# Patient Record
Sex: Female | Born: 1960 | Race: Black or African American | Hispanic: No | Marital: Married | Smoking: Never smoker
Health system: Southern US, Community
[De-identification: ages and names within clinical notes are randomized; demographics above are authoritative.]

## PROBLEM LIST (undated history)

## (undated) DIAGNOSIS — Z923 Personal history of irradiation: Secondary | ICD-10-CM

## (undated) DIAGNOSIS — E7800 Pure hypercholesterolemia, unspecified: Secondary | ICD-10-CM

## (undated) DIAGNOSIS — G709 Myoneural disorder, unspecified: Secondary | ICD-10-CM

## (undated) DIAGNOSIS — J40 Bronchitis, not specified as acute or chronic: Secondary | ICD-10-CM

## (undated) DIAGNOSIS — I10 Essential (primary) hypertension: Secondary | ICD-10-CM

## (undated) DIAGNOSIS — Z9221 Personal history of antineoplastic chemotherapy: Secondary | ICD-10-CM

## (undated) DIAGNOSIS — T7840XA Allergy, unspecified, initial encounter: Secondary | ICD-10-CM

## (undated) DIAGNOSIS — J302 Other seasonal allergic rhinitis: Secondary | ICD-10-CM

## (undated) DIAGNOSIS — R51 Headache: Secondary | ICD-10-CM

## (undated) DIAGNOSIS — M47812 Spondylosis without myelopathy or radiculopathy, cervical region: Secondary | ICD-10-CM

## (undated) DIAGNOSIS — J189 Pneumonia, unspecified organism: Secondary | ICD-10-CM

## (undated) DIAGNOSIS — D72829 Elevated white blood cell count, unspecified: Secondary | ICD-10-CM

## (undated) DIAGNOSIS — J310 Chronic rhinitis: Secondary | ICD-10-CM

## (undated) DIAGNOSIS — Z1501 Genetic susceptibility to malignant neoplasm of breast: Secondary | ICD-10-CM

## (undated) DIAGNOSIS — J329 Chronic sinusitis, unspecified: Secondary | ICD-10-CM

## (undated) DIAGNOSIS — D649 Anemia, unspecified: Secondary | ICD-10-CM

## (undated) DIAGNOSIS — C50919 Malignant neoplasm of unspecified site of unspecified female breast: Secondary | ICD-10-CM

## (undated) DIAGNOSIS — G35 Multiple sclerosis: Principal | ICD-10-CM

## (undated) DIAGNOSIS — Z1589 Genetic susceptibility to other disease: Secondary | ICD-10-CM

## (undated) HISTORY — DX: Chronic rhinitis: J31.0

## (undated) HISTORY — DX: Spondylosis without myelopathy or radiculopathy, cervical region: M47.812

## (undated) HISTORY — PX: TUBAL LIGATION: SHX77

## (undated) HISTORY — DX: Genetic susceptibility to malignant neoplasm of ovary: Z15.01

## (undated) HISTORY — DX: Genetic susceptibility to other disease: Z15.89

## (undated) HISTORY — DX: Chronic sinusitis, unspecified: J32.9

## (undated) HISTORY — DX: Anemia, unspecified: D64.9

## (undated) HISTORY — DX: Elevated white blood cell count, unspecified: D72.829

## (undated) HISTORY — DX: Bronchitis, not specified as acute or chronic: J40

## (undated) HISTORY — PX: CORRECTION HAMMER TOE: SUR317

## (undated) HISTORY — PX: ABDOMINAL HYSTERECTOMY: SHX81

## (undated) HISTORY — DX: Allergy, unspecified, initial encounter: T78.40XA

## (undated) HISTORY — DX: Headache: R51

## (undated) HISTORY — DX: Pure hypercholesterolemia, unspecified: E78.00

## (undated) HISTORY — DX: Multiple sclerosis: G35

---
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
## 1998-04-24 ENCOUNTER — Other Ambulatory Visit: Admission: RE | Admit: 1998-04-24 | Discharge: 1998-04-24 | Payer: Self-pay | Admitting: Obstetrics

## 1998-05-15 ENCOUNTER — Ambulatory Visit (HOSPITAL_COMMUNITY): Admission: RE | Admit: 1998-05-15 | Discharge: 1998-05-15 | Payer: Self-pay | Admitting: Obstetrics

## 1998-05-15 ENCOUNTER — Encounter: Payer: Self-pay | Admitting: Obstetrics

## 1998-05-17 ENCOUNTER — Encounter: Payer: Self-pay | Admitting: Obstetrics

## 1998-05-17 ENCOUNTER — Ambulatory Visit (HOSPITAL_COMMUNITY): Admission: RE | Admit: 1998-05-17 | Discharge: 1998-05-17 | Payer: Self-pay | Admitting: Obstetrics

## 1998-11-14 ENCOUNTER — Encounter: Payer: Self-pay | Admitting: Obstetrics

## 1998-11-14 ENCOUNTER — Ambulatory Visit (HOSPITAL_COMMUNITY): Admission: RE | Admit: 1998-11-14 | Discharge: 1998-11-14 | Payer: Self-pay | Admitting: Obstetrics

## 1999-05-29 ENCOUNTER — Encounter: Payer: Self-pay | Admitting: *Deleted

## 1999-05-29 ENCOUNTER — Encounter: Admission: RE | Admit: 1999-05-29 | Discharge: 1999-05-29 | Payer: Self-pay | Admitting: *Deleted

## 1999-10-09 ENCOUNTER — Other Ambulatory Visit: Admission: RE | Admit: 1999-10-09 | Discharge: 1999-10-09 | Payer: Self-pay | Admitting: *Deleted

## 1999-10-22 ENCOUNTER — Inpatient Hospital Stay (HOSPITAL_COMMUNITY): Admission: AD | Admit: 1999-10-22 | Discharge: 1999-10-22 | Payer: Self-pay | Admitting: Obstetrics

## 1999-10-22 ENCOUNTER — Encounter: Payer: Self-pay | Admitting: Obstetrics

## 1999-10-23 ENCOUNTER — Other Ambulatory Visit: Admission: RE | Admit: 1999-10-23 | Discharge: 1999-10-23 | Payer: Self-pay | Admitting: Obstetrics

## 2000-01-14 ENCOUNTER — Ambulatory Visit (HOSPITAL_COMMUNITY): Admission: RE | Admit: 2000-01-14 | Discharge: 2000-01-14 | Payer: Self-pay | Admitting: Obstetrics

## 2000-01-14 ENCOUNTER — Encounter: Payer: Self-pay | Admitting: Obstetrics

## 2000-02-28 ENCOUNTER — Inpatient Hospital Stay (HOSPITAL_COMMUNITY): Admission: AD | Admit: 2000-02-28 | Discharge: 2000-02-28 | Payer: Self-pay | Admitting: Obstetrics

## 2000-02-29 ENCOUNTER — Encounter: Payer: Self-pay | Admitting: Obstetrics

## 2000-02-29 ENCOUNTER — Inpatient Hospital Stay (HOSPITAL_COMMUNITY): Admission: AD | Admit: 2000-02-29 | Discharge: 2000-03-03 | Payer: Self-pay | Admitting: Obstetrics

## 2000-03-08 ENCOUNTER — Inpatient Hospital Stay (HOSPITAL_COMMUNITY): Admission: AD | Admit: 2000-03-08 | Discharge: 2000-03-08 | Payer: Self-pay | Admitting: *Deleted

## 2000-03-09 ENCOUNTER — Encounter: Payer: Self-pay | Admitting: *Deleted

## 2000-03-10 ENCOUNTER — Inpatient Hospital Stay (HOSPITAL_COMMUNITY): Admission: AD | Admit: 2000-03-10 | Discharge: 2000-03-14 | Payer: Self-pay | Admitting: Obstetrics

## 2000-04-15 ENCOUNTER — Ambulatory Visit (HOSPITAL_COMMUNITY): Admission: RE | Admit: 2000-04-15 | Discharge: 2000-04-15 | Payer: Self-pay | Admitting: Obstetrics

## 2000-04-15 ENCOUNTER — Encounter: Payer: Self-pay | Admitting: Obstetrics

## 2000-05-12 ENCOUNTER — Encounter (HOSPITAL_COMMUNITY): Admission: RE | Admit: 2000-05-12 | Discharge: 2000-05-20 | Payer: Self-pay | Admitting: Obstetrics

## 2000-05-15 ENCOUNTER — Inpatient Hospital Stay (HOSPITAL_COMMUNITY): Admission: AD | Admit: 2000-05-15 | Discharge: 2000-05-15 | Payer: Self-pay | Admitting: Obstetrics

## 2000-05-19 ENCOUNTER — Encounter (INDEPENDENT_AMBULATORY_CARE_PROVIDER_SITE_OTHER): Payer: Self-pay | Admitting: Specialist

## 2000-05-19 ENCOUNTER — Encounter: Payer: Self-pay | Admitting: Obstetrics

## 2000-05-19 ENCOUNTER — Inpatient Hospital Stay (HOSPITAL_COMMUNITY): Admission: AD | Admit: 2000-05-19 | Discharge: 2000-05-22 | Payer: Self-pay | Admitting: Obstetrics

## 2000-05-23 ENCOUNTER — Encounter: Admission: RE | Admit: 2000-05-23 | Discharge: 2000-06-22 | Payer: Self-pay | Admitting: Obstetrics

## 2000-07-23 ENCOUNTER — Encounter: Admission: RE | Admit: 2000-07-23 | Discharge: 2000-08-22 | Payer: Self-pay | Admitting: Obstetrics

## 2000-07-24 ENCOUNTER — Encounter: Payer: Self-pay | Admitting: Obstetrics

## 2000-07-24 ENCOUNTER — Ambulatory Visit (HOSPITAL_COMMUNITY): Admission: RE | Admit: 2000-07-24 | Discharge: 2000-07-24 | Payer: Self-pay | Admitting: Obstetrics

## 2000-09-22 ENCOUNTER — Encounter: Admission: RE | Admit: 2000-09-22 | Discharge: 2000-10-22 | Payer: Self-pay | Admitting: Obstetrics

## 2000-10-23 ENCOUNTER — Encounter: Admission: RE | Admit: 2000-10-23 | Discharge: 2000-11-22 | Payer: Self-pay | Admitting: Obstetrics

## 2000-12-22 ENCOUNTER — Other Ambulatory Visit: Admission: RE | Admit: 2000-12-22 | Discharge: 2000-12-22 | Payer: Self-pay | Admitting: *Deleted

## 2000-12-23 ENCOUNTER — Encounter: Admission: RE | Admit: 2000-12-23 | Discharge: 2001-01-22 | Payer: Self-pay | Admitting: Obstetrics

## 2001-02-22 ENCOUNTER — Encounter: Admission: RE | Admit: 2001-02-22 | Discharge: 2001-03-24 | Payer: Self-pay | Admitting: Obstetrics

## 2001-03-25 ENCOUNTER — Encounter: Admission: RE | Admit: 2001-03-25 | Discharge: 2001-04-24 | Payer: Self-pay | Admitting: Obstetrics

## 2001-12-18 ENCOUNTER — Inpatient Hospital Stay (HOSPITAL_COMMUNITY): Admission: AD | Admit: 2001-12-18 | Discharge: 2001-12-18 | Payer: Self-pay | Admitting: Obstetrics

## 2002-03-01 ENCOUNTER — Ambulatory Visit (HOSPITAL_COMMUNITY): Admission: RE | Admit: 2002-03-01 | Discharge: 2002-03-01 | Payer: Self-pay | Admitting: Obstetrics

## 2002-03-01 ENCOUNTER — Encounter: Payer: Self-pay | Admitting: Obstetrics

## 2002-07-09 ENCOUNTER — Encounter: Admission: RE | Admit: 2002-07-09 | Discharge: 2002-07-09 | Payer: Self-pay | Admitting: Obstetrics

## 2002-07-13 ENCOUNTER — Encounter: Admission: RE | Admit: 2002-07-13 | Discharge: 2002-07-13 | Payer: Self-pay | Admitting: Obstetrics

## 2002-07-16 ENCOUNTER — Encounter: Admission: RE | Admit: 2002-07-16 | Discharge: 2002-07-16 | Payer: Self-pay | Admitting: Obstetrics

## 2002-07-20 ENCOUNTER — Encounter: Admission: RE | Admit: 2002-07-20 | Discharge: 2002-07-20 | Payer: Self-pay | Admitting: Obstetrics

## 2002-07-23 ENCOUNTER — Encounter: Admission: RE | Admit: 2002-07-23 | Discharge: 2002-07-23 | Payer: Self-pay | Admitting: Obstetrics

## 2002-07-26 ENCOUNTER — Encounter: Admission: RE | Admit: 2002-07-26 | Discharge: 2002-07-26 | Payer: Self-pay | Admitting: Obstetrics

## 2002-07-30 ENCOUNTER — Encounter: Admission: RE | Admit: 2002-07-30 | Discharge: 2002-07-30 | Payer: Self-pay | Admitting: Obstetrics

## 2002-08-02 ENCOUNTER — Encounter: Admission: RE | Admit: 2002-08-02 | Discharge: 2002-08-02 | Payer: Self-pay | Admitting: Obstetrics

## 2002-08-04 ENCOUNTER — Inpatient Hospital Stay (HOSPITAL_COMMUNITY): Admission: AD | Admit: 2002-08-04 | Discharge: 2002-08-08 | Payer: Self-pay | Admitting: Obstetrics

## 2002-08-04 ENCOUNTER — Encounter (INDEPENDENT_AMBULATORY_CARE_PROVIDER_SITE_OTHER): Payer: Self-pay | Admitting: *Deleted

## 2003-01-26 ENCOUNTER — Ambulatory Visit (HOSPITAL_COMMUNITY): Admission: RE | Admit: 2003-01-26 | Discharge: 2003-01-26 | Payer: Self-pay | Admitting: Obstetrics

## 2003-02-03 ENCOUNTER — Encounter: Admission: RE | Admit: 2003-02-03 | Discharge: 2003-02-03 | Payer: Self-pay | Admitting: Obstetrics

## 2003-08-19 ENCOUNTER — Encounter: Admission: RE | Admit: 2003-08-19 | Discharge: 2003-08-19 | Payer: Self-pay | Admitting: Unknown Physician Specialty

## 2004-01-19 ENCOUNTER — Encounter (INDEPENDENT_AMBULATORY_CARE_PROVIDER_SITE_OTHER): Payer: Self-pay | Admitting: Specialist

## 2004-01-19 ENCOUNTER — Ambulatory Visit (HOSPITAL_COMMUNITY): Admission: RE | Admit: 2004-01-19 | Discharge: 2004-01-19 | Payer: Self-pay | Admitting: Obstetrics

## 2004-02-12 HISTORY — PX: PARTIAL HYSTERECTOMY: SHX80

## 2004-02-12 HISTORY — PX: ABDOMINAL HYSTERECTOMY: SHX81

## 2004-02-16 ENCOUNTER — Encounter: Admission: RE | Admit: 2004-02-16 | Discharge: 2004-02-16 | Payer: Self-pay | Admitting: Obstetrics

## 2004-12-27 ENCOUNTER — Encounter (INDEPENDENT_AMBULATORY_CARE_PROVIDER_SITE_OTHER): Payer: Self-pay | Admitting: *Deleted

## 2004-12-27 ENCOUNTER — Inpatient Hospital Stay (HOSPITAL_COMMUNITY): Admission: RE | Admit: 2004-12-27 | Discharge: 2004-12-30 | Payer: Self-pay | Admitting: Obstetrics

## 2005-03-05 ENCOUNTER — Encounter: Admission: RE | Admit: 2005-03-05 | Discharge: 2005-03-05 | Payer: Self-pay | Admitting: Unknown Physician Specialty

## 2005-03-13 ENCOUNTER — Encounter: Admission: RE | Admit: 2005-03-13 | Discharge: 2005-03-13 | Payer: Self-pay | Admitting: Obstetrics

## 2006-03-17 ENCOUNTER — Encounter: Admission: RE | Admit: 2006-03-17 | Discharge: 2006-03-17 | Payer: Self-pay | Admitting: Obstetrics

## 2007-02-12 DIAGNOSIS — G35 Multiple sclerosis: Secondary | ICD-10-CM

## 2007-02-12 DIAGNOSIS — D72829 Elevated white blood cell count, unspecified: Secondary | ICD-10-CM

## 2007-02-12 HISTORY — DX: Elevated white blood cell count, unspecified: D72.829

## 2007-02-12 HISTORY — DX: Multiple sclerosis: G35

## 2007-03-19 ENCOUNTER — Encounter: Admission: RE | Admit: 2007-03-19 | Discharge: 2007-03-19 | Payer: Self-pay | Admitting: Obstetrics

## 2007-05-05 ENCOUNTER — Encounter: Admission: RE | Admit: 2007-05-05 | Discharge: 2007-05-05 | Payer: Self-pay | Admitting: Family Medicine

## 2007-05-10 ENCOUNTER — Encounter: Admission: RE | Admit: 2007-05-10 | Discharge: 2007-05-10 | Payer: Self-pay | Admitting: Neurology

## 2007-05-11 ENCOUNTER — Encounter: Admission: RE | Admit: 2007-05-11 | Discharge: 2007-05-11 | Payer: Self-pay | Admitting: Neurology

## 2007-06-09 ENCOUNTER — Encounter: Admission: RE | Admit: 2007-06-09 | Discharge: 2007-07-01 | Payer: Self-pay | Admitting: Neurology

## 2007-07-02 ENCOUNTER — Encounter: Admission: RE | Admit: 2007-07-02 | Discharge: 2007-07-08 | Payer: Self-pay | Admitting: Neurology

## 2008-03-21 ENCOUNTER — Encounter: Admission: RE | Admit: 2008-03-21 | Discharge: 2008-03-21 | Payer: Self-pay | Admitting: Obstetrics

## 2009-03-24 ENCOUNTER — Encounter: Admission: RE | Admit: 2009-03-24 | Discharge: 2009-03-24 | Payer: Self-pay | Admitting: Obstetrics

## 2010-03-03 ENCOUNTER — Encounter: Payer: Self-pay | Admitting: Obstetrics

## 2010-03-07 ENCOUNTER — Other Ambulatory Visit: Payer: Self-pay | Admitting: Obstetrics

## 2010-03-07 DIAGNOSIS — Z1239 Encounter for other screening for malignant neoplasm of breast: Secondary | ICD-10-CM

## 2010-03-26 ENCOUNTER — Ambulatory Visit
Admission: RE | Admit: 2010-03-26 | Discharge: 2010-03-26 | Disposition: A | Discharge status: Home / Self Care | Payer: BC Managed Care – PPO | Source: Ambulatory Visit | Attending: Obstetrics | Admitting: Obstetrics

## 2010-03-26 DIAGNOSIS — Z1239 Encounter for other screening for malignant neoplasm of breast: Secondary | ICD-10-CM

## 2010-04-02 ENCOUNTER — Other Ambulatory Visit: Payer: Self-pay | Admitting: Obstetrics

## 2010-04-02 DIAGNOSIS — R928 Other abnormal and inconclusive findings on diagnostic imaging of breast: Secondary | ICD-10-CM

## 2010-04-05 ENCOUNTER — Ambulatory Visit: Payer: BC Managed Care – PPO

## 2010-04-05 ENCOUNTER — Ambulatory Visit
Admission: RE | Admit: 2010-04-05 | Discharge: 2010-04-05 | Disposition: A | Discharge status: Home / Self Care | Payer: BC Managed Care – PPO | Source: Ambulatory Visit | Attending: Obstetrics | Admitting: Obstetrics

## 2010-04-05 DIAGNOSIS — R928 Other abnormal and inconclusive findings on diagnostic imaging of breast: Secondary | ICD-10-CM

## 2010-06-29 NOTE — Op Note (Signed)
[facility]  Patient:    Jacqueline Adams, Jacqueline Adams                      MRN: [id number] Proc. Date: 05/19/00 Adm. Date:  [id number] Disc. Date: [id number] Attending:  Venita Sheffield                           Operative Report  REDICTATION  PREOPERATIVE DIAGNOSIS:       Nonreassuring fetal heart rate tracing at                               37 weeks in a patient with ______, and a                               previous intrauterine spontaneous delivery at                               term.  POSTOPERATIVE DIAGNOSIS:      Nonreassuring fetal haert rate tracing at                               37 weeks in a patient with _______, and a                               previous intrauterine spontaneous delivery at                               term.  OPERATION:  SURGEON:                      Kathreen Cosier, M.D.  ANESTHESIA:                   Spinal.  DESCRIPTION OF PROCEDURE:     The patient was placed on the operating table in the supine position after the spinal administered.  The abdomen prepped and draped, and the bladder emptied with a Foley catheter.  A transverse suprapubic incision made through the old scar, and carried down to the rectus fascia.  The fascia cleaned and incised the length of the incision.  The recti muscles retracted laterally.  The peritoneum incised longitudinally.  The visceroperitoneum above the bladder was incised with the Metzenbaum scissors, and the bladder pushed off of the uterus.  A transverse lower uterine incision made.  Fluid was clear.  Apgar was 6 and 9.  The baby weighed 6 pounds and 10 ounces and delivered from the OP position.  The placenta was posterior and sent to pathology.  It was noted there was 6 cm anterior fundal myoma.  The uterine cavity cleaned with dry laps.  The uterine incision closed with continuous suture of #1 chromic including myometrium and endometrium.  The uterus closed in one layer.   The bladder flap reattached with 2-0 chromic. The uterus well contracted.  Tubes and ovaries normal.  Abdomen closed in layers.  The peritoneum with continuous suture of 0 chromic, the fascia with continuous suture of 0 Dexon, and the skin closed with subcuticular suture of 3-0 plain.  Blood  loss 500 cc.  The patient tolerated the procedure well and taken to the recovery room in good condition. DD:  06/05/00 TD:  06/06/00 Job: [id number]

## 2010-06-29 NOTE — Discharge Summary (Signed)
[facility]  Patient:    Jacqueline Adams, Jacqueline Adams                      MRN: [id number] Adm. Date:  [id number] Attending:  Venita Sheffield                           Discharge Summary  HISTORY OF PRESENT ILLNESS:   A 50 year old, gravida 2, para 1-0-0-0, at [redacted] weeks pregnant with premature contractions.  She had been on Terbutaline at home 5 mg every six hours, but she still had started having contractions.  HOSPITAL COURSE:              On admission her temperature was 101.6 and she was started on ampicillin 2 grams IV every six hours.  This has been the second hospital visit for the patient since she has been pregnant.  On admission to this visit, there was no nausea and no vomiting.  Her examination was basically benign.  She was seen in consult by Dr. Gavin Potters and the therapy unchanged.  She was subsequently changed to magnesium sulfate 4 gram loading and 2 grams an hour because of continuous contractions.  Eventually her contractions stopped on March 13, 2000.  She was started on Procardia 10 mg every six hours since January 30.  From January 31, to February 1, she had occasional irritability, but no contractions.  She rapidly defervesced while she was hospitalized and her white count which was 24.3 on admission was down to 17.2.  She was discharged home on March 14, 2000, on Procardia 10 mg every six hours, Tylenol No.3 one q.4h. p.r.n., and ampicillin 500 mg every six hours.  DISCHARGE DIAGNOSIS:          Status post premature contractions at 27 weeks.  FOLLOW-UP:                    The patient is to follow up with me in one week. DD:  05/13/00 TD:  05/13/00 Job: [id number]

## 2010-06-29 NOTE — Discharge Summary (Signed)
[facility]  Patient:    Jacqueline Adams, Jacqueline Adams                      MRN: [id number] Adm. Date:  [id number] Disc. Date: [id number] Attending:  Venita Sheffield                           Discharge Summary  HISTORY OF PRESENT ILLNESS:   The patient is a 50 year old, gravida 2, para 1-0-0-0, who had intrauterine fetal demise while in labor in 1989 at term. With this pregnancy her EDC was between May 07, 2000, and May 13, 2000. She was followed with nonstress tests from 36 weeks.  When she was seen on April 8, the nonstress test was nonreactive.  A biophysical profile was 6 out of 8.  Estimated fetal weight by ultrasound was 8 pounds.  She had had a previous cesarean section in 1989 when she had her stillborn.  On admission with this pregnancy her cervix was long, closed, and vertex at -3 station.  It was decided that she would be delivered by cesarean section at 37+ weeks because of history of intrauterine fetal demise while at term in labor, nonreactive nonstress test, unfavorable cervix.  Her Group B Strep was negative.  HOSPITAL COURSE:              She underwent a repeat low transverse cesarean section and had a female, Apgars 6 and 9 weighing 6 pounds 10 ounces from the OP position.  Placenta was sent to pathology.  She also had an anterior 6 cm myoma noted.  Postoperatively she did well.  Her hemoglobin was 9.7 and she was discharged home on the third postoperative day.  DISCHARGE MEDICATIONS:        1. Tylenol No.3.                               2. Micronor.                               3. Ferrous sulfate.  FOLLOW-UP:                    To see me in six weeks.  DISCHARGE DIAGNOSIS:          Status post repeat low transverse cesarean section at 37 weeks because of a nonreactive nonstress test and a history of of intrauterine fetal demise at term and also unfavorable cervix. DD:  05/22/00 TD:  05/22/00 Job: [id number]

## 2010-06-29 NOTE — Discharge Summary (Signed)
[name], Jacqueline Adams NO.:  [id number]   MEDICAL RECORD NO.:  [id number]          PATIENT TYPE:  INP   LOCATION:  9319                          FACILITY:  [facility]   PHYSICIAN:  Kathreen Cosier, M.D.DATE OF BIRTH:  1960-10-28   DATE OF ADMISSION:  12/27/2004  DATE OF DISCHARGE:  12/30/2004                                 DISCHARGE SUMMARY   The patient is a 50 year old gravida 3, para 2-0-1-2, with a history of  chronic pelvic pain, dysfunctional uterine bleeding, and NovaSure ablation a  year ago with no improvement.  She had three previous cesarean sections.  She was brought in for TAH and BSO.   LABORATORY DATA:  On admission, her hemoglobin was 11.9, white count 13.4,  platelets 336.  PT and PTT were normal.  Sodium was 140, potassium  3.8,  chloride 107, glucose 85.  Urinalysis was negative.  She was O positive.  Postoperatively, her hemoglobin was 10.4.   HOSPITAL COURSE:  The patient underwent a TAH and bilateral salpingectomy on  December 27, 2004, and she did well postoperatively.  She was discharged  home on the third postoperative day ambulatory and on a regular diet, on  November 19, to see me in 6 weeks.   DISCHARGE MEDICATIONS:  Tylox.   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           ______________________________  Kathreen Cosier, M.D.     BAM/MEDQ  D:  12/30/2004  T:  12/31/2004  Job:  [id number]

## 2010-06-29 NOTE — Discharge Summary (Signed)
   NAME:  Jacqueline Adams, Jacqueline Adams                      ACCOUNT NO.:  [id number]   MEDICAL RECORD NO.:  [id number]                   PATIENT TYPE:  INP   LOCATION:  9115                                 [facility]ACILITY[facility]  [facility]   PHYSICIAN:  Kathreen Cosier, M.D.           DATE OF BIRTH:  1960-05-30   DATE OF ADMISSION:  08/04/2002  DATE OF DISCHARGE:  08/08/2002                                 DISCHARGE SUMMARY   HISTORY OF PRESENT ILLNESS:  The patient is a 50 year old female gravida 3,  para 2-0-0-1, who had two previous C-sections and was admitted at term in  early labor for repeat C-section and tubal ligation.   HOSPITAL COURSE:  Using spinal anesthesia, the patient had a repeat C-  section.  She had a female with Apgars 9 and 9, weighing 8 pounds 9 ounces.  She also had tubal ligation performed.  Postoperatively, she did well.  Her  hemoglobin was 10.6 postoperatively and she was discharged home on the third  postoperative day, ambulatory, on a regular diet, to see me in six weeks.   DISCHARGE DIAGNOSIS:  Status post repeat low transverse cesarean section and  tubal ligation at term.                                               Kathreen Cosier, M.D.    BAM/MEDQ  D:  08/25/2002  T:  08/26/2002  Job:  [id number]

## 2010-06-29 NOTE — Discharge Summary (Signed)
[facility]  Patient:    SHARAINE, DELANGE                      MRN: [id number] Adm. Date:  [id number] Disc. Date: 03/03/00 Attending:  Venita Sheffield                           Discharge Summary  HISTORY OF PRESENT ILLNESS:   The patient is a 50 year old, gravida 2, para 1-0-0-0.  She had a stillborn previously.  Her EDC was June 10, 2000, and she was 25+ weeks.  She was admitted with premature contractions.  HOSPITAL COURSE:              On admission she was given Terbutaline subcu and p.o., however, she continued contracting.  She was started on magnesium sulfate 4 gram loading and 2 grams an hour.  She also received betamethasone x 2.  The patient did well.  Sometimes her magnesium had to be increased to 3 grams per hour and then back to 2 grams an hour.  She did well and was discharged home on March 03, 2000.  DISCHARGE MEDICATIONS:        1. Ampicillin 500 mg p.o. q.6h.                               2. Terbutaline 5 mg p.o. q.6h.  FOLLOW-UP:                    To see me in two weeks.  DISCHARGE DIAGNOSIS:          Status post intrauterine pregnancy at 25 weeks and premature contractions. DD:  05/13/00 TD:  05/13/00 Job: [id number]

## 2010-06-29 NOTE — Op Note (Addendum)
Jacqueline Adams, SILVERSTEIN NO.:  [id number]   MEDICAL RECORD NO.:  [id number]          PATIENT TYPE:  INP   LOCATION:  9399                          FACILITY:  [facility]   PHYSICIAN:  Kathreen Cosier, M.D.DATE OF BIRTH:  1960/03/03   DATE OF PROCEDURE:  12/27/2004  DATE OF DISCHARGE:                                 OPERATIVE REPORT   PREOPERATIVE DIAGNOSES:  1.  Chronic pelvic pain.  2.  Dysfunctional uterine bleeding.  3.  NovaSure ablation.   SURGEON:  Dr. Gaynell Face   FIRST ASSISTANT:  Dr. Alwyn Ren   PROCEDURE:  TAH and bilateral salpingectomy.   Under general anesthesia, the patient in supine position, the abdomen  prepped and draped.  Bladder emptied with a____ Foley catheter.  A                                       transverse suprapubic incision made through the old scar.  Incision carried  down to the fascia.  Fascia cleanly and incised the length of the incision.  Recti muscles retracted laterally, peritoneum incised longitudinally.  The  uterus was enlarged with myomas, and the uterus was attached to the anterior  peritoneum.  Using the Metzenbaum scissors, the adhesions were lysed.  The  ovaries were normal.  The remnants of the tubes present.  The right round  ligament was grasped with Kelly clamp, cut, and suture ligated with #1  chromic.  Procedure done in a similar fashion on the other side.  Using the  Metzenbaum scissors, the bladder was dissected off of the cervix.  The right  utero-ovarian ligament was grasped with a Kelly clamp but suture ligated  with #1 chromic.  Procedure done a similar fashion on the other side.  Uterine vessels skeletonized bilaterally, doubly clamped with Heaney clamps  on the right, cut and suture ligated with #1 chromic.  Procedure done in a  similar fashion on the other side.  Straight Kocher clamps used to grasp the  cardinal and uterosacral ligaments on the right.  These were cut, suture  ligated with #1 chromic.   Procedure done in a similar fashion on the other  side.  Using the Mayo scissors, the specimen consisting of the uterus and  cervix was removed at the cervicovaginal junction with the Mayo scissors.  Modified Richardson suture was placed in the angles of the vagina and the  vaginal vault run with interlocking suture of #1 chromic.  The vault was  left open; hemostasis was satisfactory; blood loss 350 mL.  Right Kelly  clamp used to grasp remaining portion of left tube.  This was cut and suture  ligated with #1 chromic.  Procedure done in a similar fashion on the other  side.  Lap and sponge counts correct.  Hemostasis satisfactory.  Abdomen  closed in layers.  Peritoneum continuous suture of 0 chromic, fascia  continuous suture of 0 Dexon and the skin closed with subcuticular stitch of  4-0 Monocryl.  Blood loss 350 mL.  The patient tolerated  the procedure well,  taken to recovery room in good condition.  There were 500 mL of clear urine.           ______________________________  Kathreen Cosier, M.D.     BAM/MEDQ  D:  12/27/2004  T:  12/27/2004  Job:  [id number]

## 2010-06-29 NOTE — Op Note (Signed)
[name], [name] NO.:  [id number]   MEDICAL RECORD NO.:  [id number]          PATIENT TYPE:  AMB   LOCATION:  SDC                           [facility]ILITY:  [facility]   PHYSICIAN:  Kathreen Cosier, M.D.DATE OF BIRTH:  1960/05/09   DATE OF PROCEDURE:  01/19/2004  DATE OF DISCHARGE:                                 OPERATIVE REPORT   PREOPERATIVE DIAGNOSES:  Dysfunctional uterine bleeding.   POSTOPERATIVE DIAGNOSES:  Dysfunctional uterine bleeding.   PROCEDURE:  D&C hysteroscopy, NovaSure ablation.   DESCRIPTION OF PROCEDURE:  Under general anesthesia the patient in lithotomy  position, the perineum and vagina prepped and draped, the laminaria was  removed from the cervix and then the vagina prepped.  Examination revealed  the uterus to be normal size, negative adnexa. Speculum placed in the  vagina, anterior lip of the cervix grasped with tenaculum, endocervix  curetted, small amount of tissue obtained.  Endometrial cavity sounded to 10  cm.  The cervical length was measured with the Hegar dilator to be 6 cm.  The diagnostic hysteroscope was inserted and the cavity was normal. Sharp  curettage was then performed, a moderate amount of tissue obtained. The  NovaSure device was inserted and the cavity integrity tested, then NovaSure  ablation was done for 1 minute and 10 seconds at 55 watts. The cavity width  was 2.5 cm.  Hysteroscopy was then performed again, the cavity was totally  ablated. The fluid deficit was 60 mL.  The patient tolerated the procedure  well and was taken to the recovery room in good condition.      BAM/MEDQ  D:  01/19/2004  T:  01/19/2004  Job:  [id number]

## 2010-06-29 NOTE — Op Note (Signed)
   NAME:  Jacqueline Adams, Jacqueline Adams                      ACCOUNT NO.:  [id number]   MEDICAL RECORD NO.:  [id number]                   PATIENT TYPE:  INP   LOCATION:  9115                                 [facility]ILITY[facility]   PHYSICIAN:  Kathreen Cosier, M.D.           DATE OF BIRTH:  Jan 25, 1961   DATE OF PROCEDURE:  08/04/2002  DATE OF DISCHARGE:                                 OPERATIVE REPORT   PREOPERATIVE DIAGNOSES:  1. Previous cesarean sections x2.  2. At term in early labor.  3. Multiparity.   SURGEON:  Kathreen Cosier, M.D.   ANESTHESIA:  Spinal anesthesia.   DESCRIPTION OF PROCEDURE:  The patient was placed on the operating table in  the supine position after spinal administered.  Abdomen prepped and draped.  Bladder emptied with Foley catheter.  A transverse suprapubic incision was  made through the old scar and carried down to the rectus fascia.  Fascia  cleanly incised to the left of the incision. Rectus muscles retracted  laterally.  Peritoneum incised longitudinally.  Transverse incision made in  the visceral peritoneum above the bladder and the bladder moved laterally.  Low transverse uterine incision made with a small amount of clear fluid.  She was delivered from the OP position  of a female. Apgars 8 and 9, weighing  8 pounds 9 ounces.  The NIC team was in attendance.  Placenta was anterior  and removed manually.  The uterine cavity cleaned with dry laps.  The  uterine incision closed in one layer with continued suture of #1 chromic.  Hemostasis was satisfactory.  The ovaries were normal. The right tube was  grasped in the mid portion with a Babcock clamp.  A 0 plain suture placed in  the mesosalpinx below the portion of tube within the clamp.  This was tied  and approximately one and a half inches of tube were removed.  The procedure  was done in exact fashion on the other side.  Hemostasis was satisfactory.  Bladder flap reattached with 2-0 chromic. Abdomen closed in  layers of  peritoneum with continuous suture of 0 chromic, fascia with continuous  suture of 0 Dexon.  The skin closed with subcuticular suture of 3-0 plain.  Blood loss was 500 mL.  The patient tolerated the procedure well and was  taken to the recovery room in good condition.                                               Kathreen Cosier, M.D.    BAM/MEDQ  D:  08/04/2002  T:  08/04/2002  Job:  [id number]

## 2010-06-29 NOTE — H&P (Signed)
   NAME:  PORSCHE, NOGUCHI                      ACCOUNT NO.:  [id number]   MEDICAL RECORD NO.:  [id number]                   PATIENT TYPE:  INP   LOCATION:  9115                                 [facility]   PHYSICIAN:  Kathreen Cosier, M.D.           DATE OF BIRTH:  08/01/1960   DATE OF ADMISSION:  08/04/2002  DATE OF DISCHARGE:                                HISTORY & PHYSICAL   HISTORY OF PRESENT ILLNESS:  The patient is a 50 year old gravida 3, para 2-  0-0-1, [facility] August 08, 2002.  She has had two previous cesarean sections and is  now at term in early labor.  In for repeat cesarean section and tubal  ligation.  The patient's antenatal course was uncomplicated.  Her GBS was  negative.  The patient desired sterilization.   PHYSICAL EXAMINATION:  GENERAL:  Well-developed female in no distress.  HEENT:  Negative.  LUNGS:  Clear.  HEART:  Regular rhythm.  No murmurs.  No gallops.  ABDOMEN:  Term sized uterus.  PELVIC:  Cervix was fingertip with presenting part floating.  EXTREMITIES:  Negative.                                               Kathreen Cosier, M.D.    BAM/MEDQ  D:  08/04/2002  T:  08/04/2002  Job:  [id number]

## 2011-01-17 ENCOUNTER — Telehealth: Payer: Self-pay | Admitting: Oncology

## 2011-01-17 NOTE — Telephone Encounter (Signed)
S/w the pt and she is aware of the np appt on 01/30/2011@3 :00pm

## 2011-01-21 ENCOUNTER — Telehealth: Payer: Self-pay | Admitting: Oncology

## 2011-01-21 NOTE — Telephone Encounter (Signed)
DELIVERED CHART °

## 2011-01-23 ENCOUNTER — Encounter: Payer: Self-pay | Admitting: Oncology

## 2011-01-23 DIAGNOSIS — G35 Multiple sclerosis: Secondary | ICD-10-CM | POA: Insufficient documentation

## 2011-01-23 DIAGNOSIS — D72829 Elevated white blood cell count, unspecified: Secondary | ICD-10-CM | POA: Insufficient documentation

## 2011-01-29 ENCOUNTER — Other Ambulatory Visit: Payer: BC Managed Care – PPO | Admitting: Lab

## 2011-01-30 ENCOUNTER — Ambulatory Visit: Payer: BC Managed Care – PPO | Admitting: Oncology

## 2011-01-30 ENCOUNTER — Other Ambulatory Visit: Payer: BC Managed Care – PPO | Admitting: Lab

## 2011-01-30 ENCOUNTER — Ambulatory Visit: Payer: BC Managed Care – PPO

## 2011-01-30 ENCOUNTER — Telehealth: Payer: Self-pay | Admitting: *Deleted

## 2011-01-30 NOTE — Telephone Encounter (Signed)
Called pt at home to cancel today's appointment d/t Dr. Gaylyn Rong is ill.  Informed her we would r/s appt to next week on 02/06/11 at 3pm for labs and 3:30pm to see Dr. Gaylyn Rong.  Apologized for her inconvenience and she verbalized understanding.

## 2011-02-06 ENCOUNTER — Ambulatory Visit: Payer: BC Managed Care – PPO

## 2011-02-06 ENCOUNTER — Telehealth: Payer: Self-pay | Admitting: Oncology

## 2011-02-06 ENCOUNTER — Ambulatory Visit (HOSPITAL_BASED_OUTPATIENT_CLINIC_OR_DEPARTMENT_OTHER): Payer: BC Managed Care – PPO | Admitting: Oncology

## 2011-02-06 ENCOUNTER — Other Ambulatory Visit (HOSPITAL_BASED_OUTPATIENT_CLINIC_OR_DEPARTMENT_OTHER): Payer: BC Managed Care – PPO | Admitting: Lab

## 2011-02-06 ENCOUNTER — Encounter: Payer: Self-pay | Admitting: Oncology

## 2011-02-06 VITALS — BP 136/87 | HR 94 | Temp 98.2°F | Ht 64.0 in | Wt 205.0 lb

## 2011-02-06 DIAGNOSIS — G35 Multiple sclerosis: Secondary | ICD-10-CM

## 2011-02-06 DIAGNOSIS — D72829 Elevated white blood cell count, unspecified: Secondary | ICD-10-CM

## 2011-02-06 DIAGNOSIS — J329 Chronic sinusitis, unspecified: Secondary | ICD-10-CM

## 2011-02-06 DIAGNOSIS — D7289 Other specified disorders of white blood cells: Secondary | ICD-10-CM

## 2011-02-06 DIAGNOSIS — Z803 Family history of malignant neoplasm of breast: Secondary | ICD-10-CM

## 2011-02-06 LAB — COMPREHENSIVE METABOLIC PANEL
ALT: 17 U/L (ref 0–35)
AST: 16 U/L (ref 0–37)
Albumin: 3.4 g/dL — ABNORMAL LOW (ref 3.5–5.2)
Calcium: 9.5 mg/dL (ref 8.4–10.5)
Chloride: 102 mEq/L (ref 96–112)
Creatinine, Ser: 0.61 mg/dL (ref 0.50–1.10)
Glucose, Bld: 89 mg/dL (ref 70–99)
Sodium: 139 mEq/L (ref 135–145)
Total Protein: 8 g/dL (ref 6.0–8.3)

## 2011-02-06 LAB — CBC WITH DIFFERENTIAL/PLATELET
BASO%: 0.3 % (ref 0.0–2.0)
Basophils Absolute: 0 10*3/uL (ref 0.0–0.1)
EOS%: 1.5 % (ref 0.0–7.0)
Eosinophils Absolute: 0.2 10*3/uL (ref 0.0–0.5)
MCHC: 32.4 g/dL (ref 31.5–36.0)
MONO%: 4.3 % (ref 0.0–14.0)
NEUT#: 9.1 10*3/uL — ABNORMAL HIGH (ref 1.5–6.5)
Platelets: 297 10*3/uL (ref 145–400)
WBC: 12.9 10*3/uL — ABNORMAL HIGH (ref 3.9–10.3)

## 2011-02-06 LAB — CHCC SMEAR

## 2011-02-06 LAB — MORPHOLOGY
PLT EST: ADEQUATE
RBC Comments: NORMAL

## 2011-02-06 NOTE — Telephone Encounter (Signed)
gve the pt her feb,april ,july 2013 appt calendar

## 2011-02-06 NOTE — Progress Notes (Signed)
[facility] INITIAL HEMATOLOGY CONSULTATION  Referral MD:  Dr. Bernadette Hoit, M.D.  Reason for Referral: leukocytosis.   HPI:  [name] is a 50 yo African American woman with chronic sinusitis for many years.  She was diagnosed with multiple sclerosis 3 years ago; and has been under the care of Dr. Sandria Manly.  She was told as far back as 3 years ago that she has leukocytosis with total WBC around 12.  She recently started seeing Dr. Riley Nearing.  A CBC was obtained on 01/10/2011 which showed WBC 14.3; Hgb 12.3; Plt 285 with 57% neutrophils.  Due to worsened leukocytosis, she was kindly referred to the [facility] for evaluation.  Ms. Sickman presented to the clinic by herself today.  She has had recurrent sinus infection with rhinorrhea, sinus pain, and occasional purulent discharge.  She follows up with Dr. Annalee Genta.  She has occasional hot flash and night sweat.  She denies fever, fatigue, headache, confusion, palpable node swelling, visible source of bleeding, chest pain, cough, abdominal pain, early satiety.  Patient denies fatigue, headache, visual changes, confusion, drenching night sweats, palpable lymph node swelling, mucositis, odynophagia, dysphagia, nausea vomiting, jaundice, chest pain, palpitation, shortness of breath, dyspnea on exertion, productive cough, gum bleeding, epistaxis, hematemesis, hemoptysis, abdominal pain, abdominal swelling, early satiety, melena, hematochezia, hematuria, skin rash, spontaneous bleeding, joint swelling, joint pain, heat or cold intolerance, bowel bladder incontinence, back pain, focal motor weakness, paresthesia, depression, suicidal or homocidal ideation, feeling hopelessness.   Past Medical History  Diagnosis Date  . Sinusitis   . Bronchitis   . Cervical spondylarthritis   . Rhinitis   . Headache   . Multiple sclerosis 2009    followed by Dr. Sandria Manly  . Leukocytosis 2009  :    Past Surgical History  Procedure Date  .  Cesarean section     x 3  . Partial hysterectomy 2006    for uterine fibroid.   :   CURRENT MEDS: Current Outpatient Prescriptions  Medication Sig Dispense Refill  . AVONEX 30 MCG/0.5ML injection Inject 30 mcg into the muscle every 7 (seven) days.       . chlorpheniramine-HYDROcodone (TUSSIONEX) 10-8 MG/5ML LQCR Take 5 mLs by mouth every 6 (six) hours as needed.       [name] HYDROcodone-acetaminophen (NORCO) 5-325 MG per tablet Take 1 tablet by mouth every 6 (six) hours as needed.       [name] ibuprofen (ADVIL,MOTRIN) 400 MG tablet Take 400 mg by mouth every 8 (eight) hours as needed.        . Vitamin D, Ergocalciferol, (DRISDOL) 50000 UNITS CAPS Take 50,000 Units by mouth every 7 (seven) days.           Allergies  Allergen Reactions  . Relafen (Nabumetone) Swelling  :  Family History  Problem Relation Age of Onset  . Cancer Mother 14    ovarian cancer; now with suspected breast cancer  . Clotting disorder Father   . Cancer Maternal Aunt     breast cancer  . Cancer Maternal Grandmother     unknown female reproductive organ cancer  . Cancer Maternal Aunt     breast cancer  . Cancer Maternal Aunt     breast cancer  :  History   Social History  . Marital Status: Married    Spouse Name: N/A    Number of Children: 2  . Years of Education: N/A   Occupational History  . chemical byer [employer]  History Main Topics  . Smoking status: Never Smoker   . Smokeless tobacco: Never Used  . Alcohol Use: No  . Drug Use: No  . Sexually Active: Yes    Birth Control/ Protection: None, Surgical   Other Topics Concern  . Not on file   Social History Narrative  . No narrative on file    REVIEW OF SYSTEM:  The rest of the 14-point review of sytem was negative.   Exam:  ECOG 0.    General:  Mildly obese woman in no acute distress.  Eyes:  no scleral icterus.  ENT:  There were no oropharyngeal lesions.  Neck was without thyromegaly.  Lymphatics:  Negative cervical,  supraclavicular or axillary adenopathy.  Respiratory: lungs were clear bilaterally without wheezing or crackles.  Cardiovascular:  Regular rate and rhythm, S1/S2, without murmur, rub or gallop.  There was no pedal edema.  GI:  abdomen was soft, flat, nontender, nondistended, without organomegaly.  Muscoloskeletal:  no spinal tenderness of palpation of vertebral spine.  Skin exam was without echymosis, petichae.  Neuro exam was nonfocal.  Patient was able to get on and off exam table without assistance.  Gait was normal.  Patient was alerted and oriented.  Attention was good.   Language was appropriate.  Mood was normal without depression.  Speech was not pressured.  Thought content was not tangential.    LABS:  Lab Results  Component Value Date   WBC 12.9* 02/06/2011   HGB 12.5 02/06/2011   HCT 38.4 02/06/2011   PLT 297 02/06/2011   GLUCOSE 89 02/06/2011   ALT 17 02/06/2011   AST 16 02/06/2011   NA 139 02/06/2011   K 3.2* 02/06/2011   CL 102 02/06/2011   CREATININE 0.61 02/06/2011   BUN 8 02/06/2011   CO2 30 02/06/2011    Blood smear review:   I personally reviewed the patient's peripheral blood smear today.  There was isocytosis.  There was no peripheral blast.  There was no schistocytosis, spherocytosis, target cell, rouleaux formation, tear drop cell.  There was no giant platelets or platelet clumps.    ASSESSMENT AND PLAN:  1.  Chronic sinusitis:  Under care of Dr. Annalee Genta.  She is considering some intermittent NEB treatment with him to decrease the chance of recurrent sinusitis. 2.  Multiple sclerosis:  Right sided weakness history.  Resolved completely now for the past 3 years with Avonex injection with Dr. Sandria Manly. 3.  Chronic neutrophil-predominant leukocytosis:    Differential diagnosis:  Most likely reactive due to chronic sinusitis.  This problem has been going on as far back as 2009; possibly longer.  Her baseline WBC is around 12 which is what it is today.  My review of her  blood smear show very low clinical suspicion for a leukemic process either acute or chronic.  There was no peripheral blast or left shift.  I have low clinical suspicion for a lymphoproliferative process due to lack of B-symptoms and a no exam finding.    Work up:  No further work up is indicated at this time.  Flow cytometry at this time with a rather benign blood smear review and stable leukocytosis is of low yield as is an invasive diagnostic bone marrow biopsy.  Follow up:  Lab only with CBC in 2 and 4 months at the [facility].  I'll see her in 6 months.  In the future, if her leukocytosis significantly worsens without concurrent acute sinusitis, I may consider further wok up  at that time.  Patient expressed informed understanding of the risk and benefit of the stated plan. Patient asked appropriate questions. Patient would like to go ahead with the stated recommendation.  The length of time of the face-to-face encounter was 45 minutes. More than 50% of time was spent counseling and coordination of care.

## 2011-02-06 NOTE — Progress Notes (Signed)
Given her extensive family history of breast cancer and ovarian cancer; I strongly urged her to talk with her mother or her aunts (the index cases) to consider genetic counseling and testing for BRCA1/2.  She will try to talk to her mother and aunts.  If they refuse, I advised patient to call us back to have a referral to high risk clinic with Dr. Welton Flakes to consider this testing.  Patient herself is uptodate with her mammogram.  She plans to get routine screening colonoscopy when she turns 52.

## 2011-02-13 ENCOUNTER — Other Ambulatory Visit: Payer: BC Managed Care – PPO | Admitting: Lab

## 2011-03-21 ENCOUNTER — Other Ambulatory Visit: Payer: Self-pay | Admitting: Obstetrics

## 2011-03-21 DIAGNOSIS — Z1231 Encounter for screening mammogram for malignant neoplasm of breast: Secondary | ICD-10-CM

## 2011-04-08 ENCOUNTER — Other Ambulatory Visit (HOSPITAL_BASED_OUTPATIENT_CLINIC_OR_DEPARTMENT_OTHER): Payer: BC Managed Care – PPO

## 2011-04-08 DIAGNOSIS — D72829 Elevated white blood cell count, unspecified: Secondary | ICD-10-CM

## 2011-04-08 LAB — CBC WITH DIFFERENTIAL/PLATELET
BASO%: 0.5 % (ref 0.0–2.0)
Basophils Absolute: 0.1 10*3/uL (ref 0.0–0.1)
EOS%: 1.3 % (ref 0.0–7.0)
HGB: 12.3 g/dL (ref 11.6–15.9)
MCH: 25.9 pg (ref 25.1–34.0)
MCV: 79.3 fL — ABNORMAL LOW (ref 79.5–101.0)
MONO%: 5.5 % (ref 0.0–14.0)
RBC: 4.76 10*6/uL (ref 3.70–5.45)
RDW: 16 % — ABNORMAL HIGH (ref 11.2–14.5)
lymph#: 3.5 10*3/uL — ABNORMAL HIGH (ref 0.9–3.3)

## 2011-04-09 ENCOUNTER — Ambulatory Visit
Admission: RE | Admit: 2011-04-09 | Discharge: 2011-04-09 | Disposition: A | Discharge status: Home / Self Care | Payer: BC Managed Care – PPO | Source: Ambulatory Visit | Attending: Obstetrics | Admitting: Obstetrics

## 2011-04-09 DIAGNOSIS — Z1231 Encounter for screening mammogram for malignant neoplasm of breast: Secondary | ICD-10-CM

## 2011-04-12 ENCOUNTER — Telehealth: Payer: Self-pay | Admitting: *Deleted

## 2011-04-12 NOTE — Telephone Encounter (Signed)
Called pt to inform her of result of WBC from 04/08/11 was stable per Dr. Gaylyn Rong and to keep next lab appt as scheduled in April.  Pt verbalized understanding.

## 2011-04-16 ENCOUNTER — Other Ambulatory Visit (HOSPITAL_COMMUNITY): Payer: Self-pay | Admitting: Obstetrics

## 2011-04-16 DIAGNOSIS — N83209 Unspecified ovarian cyst, unspecified side: Secondary | ICD-10-CM

## 2011-04-18 ENCOUNTER — Ambulatory Visit (HOSPITAL_COMMUNITY)
Admission: RE | Admit: 2011-04-18 | Discharge: 2011-04-18 | Disposition: A | Discharge status: Home / Self Care | Payer: BC Managed Care – PPO | Source: Ambulatory Visit | Attending: Obstetrics | Admitting: Obstetrics

## 2011-04-18 DIAGNOSIS — N83209 Unspecified ovarian cyst, unspecified side: Secondary | ICD-10-CM | POA: Insufficient documentation

## 2011-04-18 DIAGNOSIS — Z90710 Acquired absence of both cervix and uterus: Secondary | ICD-10-CM | POA: Insufficient documentation

## 2011-04-30 ENCOUNTER — Other Ambulatory Visit (HOSPITAL_COMMUNITY): Payer: Self-pay | Admitting: Obstetrics

## 2011-04-30 DIAGNOSIS — N83209 Unspecified ovarian cyst, unspecified side: Secondary | ICD-10-CM

## 2011-05-06 ENCOUNTER — Ambulatory Visit (HOSPITAL_COMMUNITY)
Admission: RE | Admit: 2011-05-06 | Discharge: 2011-05-06 | Disposition: A | Discharge status: Home / Self Care | Payer: BC Managed Care – PPO | Source: Ambulatory Visit | Attending: Obstetrics | Admitting: Obstetrics

## 2011-05-06 DIAGNOSIS — N83209 Unspecified ovarian cyst, unspecified side: Secondary | ICD-10-CM

## 2011-05-06 DIAGNOSIS — Z8041 Family history of malignant neoplasm of ovary: Secondary | ICD-10-CM | POA: Insufficient documentation

## 2011-05-06 DIAGNOSIS — Z90710 Acquired absence of both cervix and uterus: Secondary | ICD-10-CM | POA: Insufficient documentation

## 2011-05-06 MED ORDER — GADOBENATE DIMEGLUMINE 529 MG/ML IV SOLN
20.0000 mL | Freq: Once | INTRAVENOUS | Status: AC | PRN
  Administered 2011-05-06: 19 mL via INTRAVENOUS

## 2011-05-15 ENCOUNTER — Telehealth: Payer: Self-pay | Admitting: *Deleted

## 2011-05-15 NOTE — Telephone Encounter (Signed)
Confirmed 06/03/11 High Risk appt w/ pt.

## 2011-05-21 ENCOUNTER — Telehealth: Payer: Self-pay | Admitting: *Deleted

## 2011-05-21 NOTE — Telephone Encounter (Signed)
per note from the md moved patient appointment to a high risk clinic

## 2011-05-23 ENCOUNTER — Encounter: Payer: Self-pay | Admitting: Oncology

## 2011-06-03 ENCOUNTER — Encounter: Payer: BC Managed Care – PPO | Admitting: Family

## 2011-06-03 ENCOUNTER — Ambulatory Visit (HOSPITAL_BASED_OUTPATIENT_CLINIC_OR_DEPARTMENT_OTHER): Payer: BC Managed Care – PPO

## 2011-06-03 DIAGNOSIS — D72829 Elevated white blood cell count, unspecified: Secondary | ICD-10-CM

## 2011-06-03 LAB — CBC WITH DIFFERENTIAL/PLATELET
Basophils Absolute: 0 10*3/uL (ref 0.0–0.1)
EOS%: 0.7 % (ref 0.0–7.0)
Eosinophils Absolute: 0.1 10*3/uL (ref 0.0–0.5)
HCT: 35.7 % (ref 34.8–46.6)
MCH: 25.1 pg (ref 25.1–34.0)
MONO#: 0.7 10*3/uL (ref 0.1–0.9)
NEUT%: 60.5 % (ref 38.4–76.8)
WBC: 15 10*3/uL — ABNORMAL HIGH (ref 3.9–10.3)
lymph#: 5.1 10*3/uL — ABNORMAL HIGH (ref 0.9–3.3)

## 2011-06-06 ENCOUNTER — Other Ambulatory Visit: Payer: BC Managed Care – PPO | Admitting: Lab

## 2011-06-12 ENCOUNTER — Telehealth: Payer: Self-pay | Admitting: Oncology

## 2011-06-12 ENCOUNTER — Ambulatory Visit (HOSPITAL_BASED_OUTPATIENT_CLINIC_OR_DEPARTMENT_OTHER): Payer: BC Managed Care – PPO | Admitting: Family

## 2011-06-12 ENCOUNTER — Encounter: Payer: Self-pay | Admitting: Family

## 2011-06-12 VITALS — BP 128/78 | HR 98 | Temp 98.3°F | Ht 64.0 in | Wt 209.8 lb

## 2011-06-12 DIAGNOSIS — Z1501 Genetic susceptibility to malignant neoplasm of breast: Secondary | ICD-10-CM | POA: Insufficient documentation

## 2011-06-12 MED ORDER — TAMOXIFEN CITRATE 10 MG PO TABS: 10.0000 mg | ORAL_TABLET | Freq: Two times a day (BID) | ORAL | Status: AC

## 2011-06-12 NOTE — Progress Notes (Signed)
[facility]  High Risk Clinic New Patient Evaluation  Name: Jac[facility]            Date: 06/12/2011 MRN: [id number]                DOB: [birth year]  CC: [facility]., MD, MD     REFERRING PHYSICIAN: Angelica Adams., MD   REASON FOR VISIT: BRCA positive status, here for recommendation for risk reduction.   HISTORY OF PRESENT ILLNESS:  Mother was diagnosed with ovarian cancer in 2001, is a pt of Dr. Arline Adams. Nov. 2012 was diagnosed with breast cancer. She was tested Feb 2013 and found to be a BRCA-2 mutation carrier. Subsequently, the patient and one female sibling have been tested and found to be positive for the mutation, also. Has one additional sister and two brothers who have not been tested.  PREVIOUS BREAST/OVARIAN BIOPSIES: Had partial hysterectomy 2006, had ultrasound of ovaries done Feb. 2013, found to have multiple ovarian cysts. Dr. Francoise Adams follows her for gynecology. No previous breast biopsies.    PREVIOUS PATHOLOGY:  No evidence of uterine cancer on hysterectomy pathology.   PAST MEDICAL HISTORY:  Sinusitis, bronchitis, cervical spondylarthritis, rhinitis, headache, multiple sclerosis (2009), and leukocytosis (2009).  PAST SURGICAL HISTORY:  Past Surgical History  Procedure Date  . Cesarean section     x 3  . Partial hysterectomy 2006    for uterine fibroid.      ALLERGIES: Relafen  SOCIAL HISTORY:  Has never smoked, never used smokeless tobacco. Does not drink alcohol or use illicit drugs.  REPRODUCTIVE HISTORY:  Menarche age: 46 Gravida:   2    Para: 2 Took fertility meds:     No  Menses: n/a Menopause: natural/surgical  Age 89                   FAMILY HISTORY: Breast cancer in two maternal aunts, maternal grandmother had unknown female reproductive cancer, breast and ovarian in her mother; and clotting disorder in her father. Mother and one sister, known BRCA-2 mutation carriers.   HEALTH MAINTENANCE: Last  mammogram: Feb, 2013, no evidence of malignancy.  Last clinical breast exam: Feb. 2013 by Dr. Gaynell Adams.  Performs self breast exam: Yes Last Pap Smear: Feb, 2013.  Colonoscopy: 2011  REVIEW OF SYSTEMS:  General: Negative for fever, chills, night sweats, loss of appetite or weight loss. HEENT: Chronic migraines since 1988, no sore  throat, difficulty swallowing, blurred vision or problem with hearing or sinus congestion. Respiratory: Negative for shortness of breath, cough  or dyspnea on exertion. Cardiovascular: Negative for chest pain, palpitations or pedal edema. GI: Negative for nausea, vomiting,  diarrhea, constipation, change in bowel habits or blood in the stool. No jaundice. GU: Negative for painful or frequent urination, change in  color of urine, or decreased urinary stream. Integumentary: Negative for skin rashes or other suspicious skin lesions. Hematologic: Negative  for easy bruisability or bleeding. Musculoskeletal: Negative for complaints of pain, arthralgias, arthritis or myalgias.  Neurological/psychiatric: Negative for numbness, focal weakness, balance problems or coordination difficulties. No depression or mood swings.  Breast: No self detected abnormalities in the breast. No nipple discharge, masses or redness of the skin.   PHYSICAL EXAM: BP 128/78  Pulse 98  Temp 98.3 F (36.8 C)  Ht 5\' 4"  (1.626 m)  Wt 209 lb 12.8 oz (95.165 kg)  BMI 36.01 kg/m2 GENERAL: Well developed, well nourished, in no acute distress.  EENT: No ocular  or oral lesions. No stomatitis.  RESPIRATORY: Lungs are clear to auscultation bilaterally with normal respiratory movement and no accessory muscle use. CARDIAC: No murmur, rub or tachycardia. No upper or lower extremity edema.  GI: Abdomen is soft, no palpable hepatosplenomegaly. No fluid wave. No tenderness. Musculoskeletal: No kyphosis, no tenderness over the spine, ribs or hips. Lymph: No cervical, infraclavicular, axillary or inguinal  adenopathy. Neuro: No focal neurological deficits. Psych: Alert and oriented X 3, appropriate mood and affect.  BREAST EXAM: In the supine position, with the right arm over the head, right nipple is everted. No periareolar edema or nipple discharge. No mass in any quadrant or subareolar region. No redness of the skin. No right axillary adenopathy. With the left arm over the head, left nipple is everted. No periareolar edema or nipple discharge. No mass in any quadrant or subareolar region. No redness of the skin. No left axillary adenopathy. Breasts are dense for age.    ASSESSMENT: 52 y/o black female with: 1. Known BRCA-2 mutation with other family members affected. Has native breasts and retained ovaries.  2. Normal mammogram Feb. 2013.  3. Ovarian cysts (by pts report) on ultrasound of the ovaries, February 2013.  4. She wishes to preserve breasts at this time, is willing to undergo salpingo-oophorectomy.   PLAN:  1.  Breast MRI 2. Prescription for tamoxifen for chemoprevention. She has appt with Dr. Sandria Adams, neurology, who treats her multiple sclerosis at the end of the month. She wishes to wait until after that appt to begin tamoxifen so she may discuss it with him first.A pt information sheet on tamoxifen is given to her.   3. Dr. Gaylyn Adams will see her July 1 for leukocytosis, he will assess tolerance of tamoxifen.  4. We will follow her every 6 months in the High Risk Clinic. We will check liver function on next visit, since initiating tamoxifen.  5. She wishes to contact Dr. Gaynell Adams herself to arrange salpingo-oophorectomies.   DISCUSSION: Current NCCN guidelines for the management of high risk patients who are known BRCA carriers are reviewed. Recommendation for maximum risk reduction is bilateral prophylactic mastectomy and bilateral prophylactic salpingo-oophorectomies. The role of Tamoxifen for chemoprevention in these patients is unknown, but may have a greater role in BRCA-2 patients, who  are more likely to develop estrogen receptor positive cancers.  In the absence of prophylactic mastectomy, recommendation is yearly breast MRI in conjunction with mammogram. In the absence of oophorectomy, the pt would be followed with transvaginal ultrasound yearly.  These recommendations are reviewed with her today. A total of 90 minutes is spent with her, 75 minutes were spent counseling on above issues.

## 2011-06-12 NOTE — Telephone Encounter (Signed)
gve the pt her oct 2013 appt calendar. lmonvm of kathy mcconnell from the [facility] regarding the pt needs a  breast mri appt asap. Pt is aware she will be contacted with the appt

## 2011-06-18 ENCOUNTER — Telehealth: Payer: Self-pay | Admitting: Oncology

## 2011-06-18 NOTE — Telephone Encounter (Signed)
S/w ameire from the lymphedema clinic and he is aware of the referral in epic for this pt's physical therapy appt

## 2011-06-19 ENCOUNTER — Ambulatory Visit: Payer: BC Managed Care – PPO | Admitting: Family

## 2011-06-26 ENCOUNTER — Ambulatory Visit
Admission: RE | Admit: 2011-06-26 | Discharge: 2011-06-26 | Disposition: A | Discharge status: Home / Self Care | Payer: BC Managed Care – PPO | Source: Ambulatory Visit | Attending: Family | Admitting: Family

## 2011-06-26 DIAGNOSIS — Z1501 Genetic susceptibility to malignant neoplasm of breast: Secondary | ICD-10-CM

## 2011-06-26 MED ORDER — GADOBENATE DIMEGLUMINE 529 MG/ML IV SOLN
19.0000 mL | Freq: Once | INTRAVENOUS | Status: AC | PRN
  Administered 2011-06-26: 19 mL via INTRAVENOUS

## 2011-06-27 ENCOUNTER — Telehealth: Payer: Self-pay | Admitting: Medical Oncology

## 2011-06-27 NOTE — Telephone Encounter (Signed)
Per Colman Cater, NP, notified patient that breast MRI was normal, no evidence of cancer, and that she will receive a MRI next year with her mammogram.  Patient expressed understanding, no further questions at this time.  Patient knows to call with any questions or concerns.

## 2011-06-27 NOTE — Telephone Encounter (Signed)
Message copied by Tylene Fantasia on Thu Jun 27, 2011  3:14 PM ------      Message from: Theotis Barrio      Created: Thu Jun 27, 2011  2:55 PM       Please call and tell her breast MRI is completely normal. No evidence of cancer. We will get MRI next year with her mammogram.

## 2011-08-11 NOTE — Patient Instructions (Signed)
1.  Chronic leukocytosis (elevated white blood cell):  Most likely reactive.  Will consider bone marrow biopsy in the future if there is significant increase or other blood abnormality.  2.  BRCA positivity:  On Tamoxifen per high risk cancer clinic.

## 2011-08-12 ENCOUNTER — Ambulatory Visit: Payer: BC Managed Care – PPO | Admitting: Oncology

## 2011-09-04 ENCOUNTER — Telehealth: Payer: Self-pay | Admitting: Oncology

## 2011-09-04 NOTE — Telephone Encounter (Signed)
lmonvm adviising the pt of her r/s appts from 12/11/2011 to 12/10/2011@9 :30am due to the md's schedule

## 2011-10-20 NOTE — Progress Notes (Signed)
Reschedule prn. 

## 2011-10-20 NOTE — Progress Notes (Signed)
No show.  Reschedule prn.  

## 2011-12-10 ENCOUNTER — Ambulatory Visit: Payer: BC Managed Care – PPO | Admitting: Oncology

## 2011-12-10 ENCOUNTER — Other Ambulatory Visit: Payer: BC Managed Care – PPO | Admitting: Lab

## 2011-12-10 ENCOUNTER — Encounter: Payer: Self-pay | Admitting: *Deleted

## 2011-12-11 ENCOUNTER — Telehealth: Payer: Self-pay | Admitting: Oncology

## 2011-12-11 ENCOUNTER — Other Ambulatory Visit: Payer: BC Managed Care – PPO | Admitting: Lab

## 2011-12-11 ENCOUNTER — Ambulatory Visit: Payer: BC Managed Care – PPO | Admitting: Family

## 2011-12-11 NOTE — Telephone Encounter (Signed)
S/w the pt and she is aware of her r/s dec appts that were cancelled in oct.

## 2012-01-10 ENCOUNTER — Other Ambulatory Visit: Payer: BC Managed Care – PPO | Admitting: Lab

## 2012-01-10 ENCOUNTER — Ambulatory Visit: Payer: BC Managed Care – PPO | Admitting: Oncology

## 2012-01-13 ENCOUNTER — Ambulatory Visit: Payer: BC Managed Care – PPO | Admitting: Oncology

## 2012-01-13 ENCOUNTER — Other Ambulatory Visit: Payer: BC Managed Care – PPO | Admitting: Lab

## 2012-01-30 ENCOUNTER — Telehealth: Payer: Self-pay | Admitting: *Deleted

## 2012-01-30 NOTE — Telephone Encounter (Signed)
Patient confirmed over the phone the new date and time 02-18-2012 starting at 12:00

## 2012-02-10 ENCOUNTER — Ambulatory Visit: Payer: BC Managed Care – PPO | Admitting: Oncology

## 2012-02-10 ENCOUNTER — Other Ambulatory Visit: Payer: BC Managed Care – PPO | Admitting: Lab

## 2012-02-18 ENCOUNTER — Other Ambulatory Visit (HOSPITAL_BASED_OUTPATIENT_CLINIC_OR_DEPARTMENT_OTHER): Payer: BC Managed Care – PPO | Admitting: Lab

## 2012-02-18 ENCOUNTER — Encounter: Payer: Self-pay | Admitting: Oncology

## 2012-02-18 ENCOUNTER — Other Ambulatory Visit: Payer: BC Managed Care – PPO | Admitting: Lab

## 2012-02-18 ENCOUNTER — Telehealth: Payer: Self-pay | Admitting: Oncology

## 2012-02-18 ENCOUNTER — Other Ambulatory Visit: Payer: Self-pay | Admitting: Emergency Medicine

## 2012-02-18 ENCOUNTER — Ambulatory Visit (HOSPITAL_BASED_OUTPATIENT_CLINIC_OR_DEPARTMENT_OTHER): Payer: BC Managed Care – PPO | Admitting: Oncology

## 2012-02-18 VITALS — BP 148/87 | HR 114 | Temp 98.1°F | Resp 20 | Ht 64.0 in | Wt 206.1 lb

## 2012-02-18 DIAGNOSIS — Z1501 Genetic susceptibility to malignant neoplasm of breast: Secondary | ICD-10-CM

## 2012-02-18 DIAGNOSIS — Z1502 Genetic susceptibility to malignant neoplasm of ovary: Secondary | ICD-10-CM

## 2012-02-18 DIAGNOSIS — D72829 Elevated white blood cell count, unspecified: Secondary | ICD-10-CM

## 2012-02-18 LAB — CBC WITH DIFFERENTIAL/PLATELET
Basophils Absolute: 0.1 10*3/uL (ref 0.0–0.1)
Eosinophils Absolute: 0.1 10*3/uL (ref 0.0–0.5)
HCT: 36.6 % (ref 34.8–46.6)
HGB: 12 g/dL (ref 11.6–15.9)
LYMPH%: 31.8 % (ref 14.0–49.7)
MCV: 77.3 fL — ABNORMAL LOW (ref 79.5–101.0)
MONO#: 0.7 10*3/uL (ref 0.1–0.9)
MONO%: 6 % (ref 0.0–14.0)
NEUT#: 7.4 10*3/uL — ABNORMAL HIGH (ref 1.5–6.5)
NEUT%: 60.5 % (ref 38.4–76.8)
Platelets: 313 10*3/uL (ref 145–400)

## 2012-02-18 LAB — COMPREHENSIVE METABOLIC PANEL (CC13)
Albumin: 3.3 g/dL — ABNORMAL LOW (ref 3.5–5.0)
Alkaline Phosphatase: 96 U/L (ref 40–150)
BUN: 7 mg/dL (ref 7.0–26.0)
CO2: 29 mEq/L (ref 22–29)
Glucose: 96 mg/dl (ref 70–99)
Total Bilirubin: 0.45 mg/dL (ref 0.20–1.20)

## 2012-02-18 MED ORDER — TAMOXIFEN CITRATE 20 MG PO TABS: 20.0000 mg | ORAL_TABLET | Freq: Every day | ORAL | Status: DC

## 2012-02-18 NOTE — Telephone Encounter (Signed)
appts made and printed for pt,had to leave a mess. At the brst center re: the mri for them to call the pt or me for any issues.  Order to follow mammo and pt aware      [facility]

## 2012-02-18 NOTE — Patient Instructions (Addendum)
Proceed with Tamoxifen as prevention  MRI/Mammogram of breasts annually

## 2012-02-18 NOTE — Progress Notes (Signed)
OFFICE PROGRESS NOTE  CC  Jacqueline [facility]., MD [phone] Laurell Josephs. 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  PRIOR THERAPY:  #1 patient was originally seen in the high-risk clinic for discussion of risk reduction for breast cancer and ovarian cancer. She was given a prescription for tamoxifen for chemoprevention. Patient also was recommended getting ongoing mammograms as well as breast MRIs. She also had discussion regarding risk reducing surgery for by a lateral prophylactic mastectomy and bilateral prophylactic salpingo-oophorectomy.   #2 patient sees [facility] Adams For chronic leukocytosis.  CURRENT THERAPY:Tamoxifen 20 mg daily for chemoprevention   INTERVAL HISTORY: Jacqueline Adams 52 y.o. female returns for Followup visit. She was last seen by Jacqueline Adams. She placed her on tamoxifen. She also recommended getting MRIs and mammograms performed on the yearly basis and to be seen in the high-risk clinic. Overall she is doing well. She however did not start her tamoxifen. We discussed this today. Patient is now ready to do this.  MEDICAL HISTORY: Past Medical History  Diagnosis Date  . Sinusitis   . Bronchitis   . Cervical spondylarthritis   . Rhinitis   . Headache   . Multiple sclerosis 2009    followed by Dr. Sandria Manly  . Leukocytosis 2009    ALLERGIES:  is allergic to relafen.  MEDICATIONS:  Current Outpatient Prescriptions  Medication Sig Dispense Refill  . AVONEX 30 MCG/0.5ML injection Inject 30 mcg into the muscle every 7 (seven) days.       . Vitamin D, Ergocalciferol, (DRISDOL) 50000 UNITS CAPS Take 50,000 Units by mouth every 7 (seven) days.       . chlorpheniramine-HYDROcodone (TUSSIONEX) 10-8 MG/5ML LQCR Take 5 mLs by mouth every 6 (six) hours as needed.       [name] HYDROcodone-acetaminophen (NORCO) 5-325 MG per tablet Take 1 tablet by mouth every 6 (six) hours as needed.       [name] ibuprofen (ADVIL,MOTRIN) 400  MG tablet Take 400 mg by mouth every 8 (eight) hours as needed.          SURGICAL HISTORY:  Past Surgical History  Procedure Date  . Cesarean section     x 3  . Partial hysterectomy 2006    for uterine fibroid.     REVIEW OF SYSTEMS:  Pertinent items are noted in HPI.   HEALTH MAINTENANCE:  MammogramDeborah 26 2013 ColonoscopyNot Bone  Scan n/a Pap SmearUp-to-date   PHYSICAL EXAMINATION: Blood pressure 148/87, pulse 114, temperature 98.1 F (36.7 C), resp. rate 20, height 5\' 4"  (1.626 m), weight 206 lb 1.6 oz (93.486 kg). Body mass index is 35.38 kg/(m^2). ECOG PERFORMANCE STATUS: 0 - Asymptomatic   General appearance: alert, cooperative and appears stated age Resp: clear to auscultation bilaterally Cardio: regular rate and rhythm GI: soft, non-tender; bowel sounds normal; no masses,  no organomegaly Neurologic: Grossly normal BREAST EXAM: In the supine position, with the right arm over the head, right nipple is everted. No periareolar edema or nipple discharge. No mass in any quadrant or subareolar region. No redness of the skin. No right axillary adenopathy. With the left arm over the head, left nipple is everted. No periareolar edema or nipple discharge. No mass in any quadrant or subareolar region. No redness of the skin. No left axillary adenopathy. Breasts are dense for age.    LABORATORY DATA: Lab Results  Component Value Date   WBC 12.3* 02/18/2012   HGB 12.0 02/18/2012   HCT  36.6 02/18/2012   MCV 77.3* 02/18/2012   PLT 313 02/18/2012      Chemistry      Component Value Date/Time   NA 139 02/06/2011 1532   K 3.2* 02/06/2011 1532   CL 102 02/06/2011 1532   CO2 30 02/06/2011 1532   BUN 8 02/06/2011 1532   CREATININE 0.61 02/06/2011 1532      Component Value Date/Time   CALCIUM 9.5 02/06/2011 1532   ALKPHOS 96 02/06/2011 1532   AST 16 02/06/2011 1532   ALT 17 02/06/2011 1532   BILITOT 0.2* 02/06/2011 1532       RADIOGRAPHIC STUDIES:  No results  found.  ASSESSMENT: 52 y/o black female with:  1. Known BRCA-2 mutation with other family members affected. Has native breasts and retained ovaries.  2. Normal mammogram Feb. 2013.  3. Ovarian cysts (by pts report) on ultrasound of the ovaries, February 2013.  4. She wishes to preserve breasts at this time, is willing to undergo salpingo-oophorectomy. She wishes to contact Dr. Gaynell Adams herself to arrange salpingo-oophorectomies.    PLAN:   #1 proceed with tamoxifen 20 mg daily  #3 I will see her back in 3 months time.   All questions were answered. The patient knows to call the clinic with any problems, questions or concerns. We can certainly see the patient much sooner if necessary.  I spent 25 minutes counseling the patient Adams to Adams. The total time spent in the appointment was 30 minutes.    Jacqueline Second, MD Medical/Oncology [facility] [phone] (beeper) [phone] (Office)  02/18/2012, 12:25 PM

## 2012-02-19 ENCOUNTER — Other Ambulatory Visit: Payer: Self-pay | Admitting: Adult Health

## 2012-04-23 ENCOUNTER — Ambulatory Visit
Admission: RE | Admit: 2012-04-23 | Discharge: 2012-04-23 | Disposition: A | Discharge status: Home / Self Care | Payer: Self-pay | Source: Ambulatory Visit | Attending: Oncology | Admitting: Oncology

## 2012-04-23 ENCOUNTER — Other Ambulatory Visit: Payer: Self-pay | Admitting: Oncology

## 2012-04-23 DIAGNOSIS — Z1509 Genetic susceptibility to other malignant neoplasm: Secondary | ICD-10-CM

## 2012-04-23 DIAGNOSIS — Z1231 Encounter for screening mammogram for malignant neoplasm of breast: Secondary | ICD-10-CM

## 2012-04-23 DIAGNOSIS — Z1501 Genetic susceptibility to malignant neoplasm of breast: Secondary | ICD-10-CM

## 2012-06-10 ENCOUNTER — Telehealth: Payer: Self-pay | Admitting: Diagnostic Neuroimaging

## 2012-06-10 DIAGNOSIS — G35 Multiple sclerosis: Secondary | ICD-10-CM

## 2012-06-10 NOTE — Telephone Encounter (Signed)
Pt calling and is asking about getting MRI's ordered.   Per Dr. Alena Bills' note it states getting MRI in 08/2012 and then see Dr. Marjory Lies in 8/14 (which would be 6 months).   Pt has appt for 11/2012.   Please advise about this.  Thanks

## 2012-06-11 NOTE — Telephone Encounter (Signed)
Consulted Dr. Marjory Lies and he ok'd the orders for MRI brain w/wo and cervical spine w/o.  Pt to have done in 08/2012 and will need f/u appt in 09-2012.

## 2012-07-28 ENCOUNTER — Telehealth: Payer: Self-pay | Admitting: Diagnostic Neuroimaging

## 2012-08-11 ENCOUNTER — Ambulatory Visit
Admission: RE | Admit: 2012-08-11 | Discharge: 2012-08-11 | Disposition: A | Discharge status: Home / Self Care | Payer: BC Managed Care – PPO | Source: Ambulatory Visit | Attending: Diagnostic Neuroimaging | Admitting: Diagnostic Neuroimaging

## 2012-08-11 DIAGNOSIS — G35 Multiple sclerosis: Secondary | ICD-10-CM

## 2012-08-11 MED ORDER — GADOBENATE DIMEGLUMINE 529 MG/ML IV SOLN
17.0000 mL | Freq: Once | INTRAVENOUS | Status: AC | PRN
  Administered 2012-08-11: 17 mL via INTRAVENOUS

## 2012-08-17 ENCOUNTER — Telehealth: Payer: Self-pay | Admitting: Diagnostic Neuroimaging

## 2012-08-17 NOTE — Telephone Encounter (Signed)
I called pt and gave her the results of MRI brain and C spine.  She stated she has had more recent MRI's from [facility] then the 2009.  She requested a call back from you please. [phone].  I made her f/u appt 10-06-12 at 1100. Thanks

## 2012-08-25 ENCOUNTER — Telehealth: Payer: Self-pay | Admitting: *Deleted

## 2012-08-25 NOTE — Telephone Encounter (Signed)
Pt calling back about MRI results.

## 2012-08-25 NOTE — Telephone Encounter (Signed)
I called patient and discussed results. -VRP

## 2012-08-27 NOTE — Telephone Encounter (Signed)
Please have prior [facility] studies sent to my reading station. -VRP

## 2012-08-28 NOTE — Telephone Encounter (Signed)
I called [facility] to get studies sent to Dr. Marjory Lies work station, they should be there some time today.

## 2012-10-01 ENCOUNTER — Ambulatory Visit: Payer: Self-pay | Admitting: Diagnostic Neuroimaging

## 2012-10-06 ENCOUNTER — Encounter: Payer: Self-pay | Admitting: Diagnostic Neuroimaging

## 2012-10-06 ENCOUNTER — Ambulatory Visit (INDEPENDENT_AMBULATORY_CARE_PROVIDER_SITE_OTHER): Payer: BC Managed Care – PPO | Admitting: Diagnostic Neuroimaging

## 2012-10-06 VITALS — BP 129/75 | HR 67 | Temp 98.3°F | Ht 65.0 in | Wt 207.0 lb

## 2012-10-06 DIAGNOSIS — G35 Multiple sclerosis: Secondary | ICD-10-CM

## 2012-10-06 NOTE — Patient Instructions (Signed)
Check [email or web address] website about gilenya and tecfidera.

## 2012-10-06 NOTE — Progress Notes (Signed)
[facility]  PATIENT: Jacqueline Adams DOB: [birth year]  REFERRING CLINICIAN:  HISTORY FROM: patient REASON FOR VISIT: follow up (former Dr. Sandria Manly)   HISTORICAL  CHIEF COMPLAINT:  Chief Complaint  Patient presents with  . Multiple Sclerosis    Dr. Sandria Manly transfer, 6 month f/u    HISTORY OF PRESENT ILLNESS:   UPDATE 10/07/12: Patient here for followup. She denies any progression of neurologic or multiple sclerosis symptoms. Overall she feels slightly better than she was doing when diagnosed in 2009. MRI studies from July 2014 shows a small new left periatrial lesion, compared to 2009 study. Comparison to MRI in 2013 was not available.  PRIOR HPI (03/17/12, Dr. Sandria Manly): 52 year old right-handed African American married female from [place], [place] with a history of right-sided numbness and weakness with abnormal MRI study of the brain 05/05/2007 and abnormal CSF evaluation 3/30/ 2009 showing a 2 cm left periventricular white matter lesion with restricted diffusion and post contrast enhancement. There were 2 lesions in the left  occipital region which did not enhance. CSF showed 12 oligoclonal bands, 8 white blood cells, 0 red blood cells, negative ACE, nonreactive VDRL, protein 50, and glucose 65.  MRI of the cervical spine 05/10/2007 was normal. BAER and VER 05/06/2007 were normal. She was started on Avonex 06/26/2007 which she has tolerated well. A followup MRI study of the brain and cervical spine 06/15/2008 showed a 1 x 1 cm left periventricular white matter lesion and other white matter hyperintensities compatible with MS without change versus 05/10/2007 and cervical disc with DJD at C4-C7 but no MS plaques. Again no change versus 05/10/2007. MRI study of the brain and cervical spine without and with contrast 07/05/10 were stable showing  no evidence of new lesions, enhancing lesions, T1 black holes, or atrophy. Last MRI study of the brain and cervical spine 08/14/2011 showed  the periventricular region smaller and the sub-cortical lesion larger in size in the brain and no changes in the cervical spinal cord without demyelinating plaques noted. 01/10/11 CBC was normal except white blood cell count 14,300. She is now followed by Dr. Gaylyn Rong with monthly to every two -month CBCs. When she lies on her right side she awakens with numbness on the right side. She is independent in her activities of daily living. She denies Lhermitte's sign, visual loss, or bowel or bladder dysfunction.  She had a second opinion by Dr. Harriette Bouillon 08/19/2007 who agreed with the diagnosis of MS. She occassionally has flulike symptoms with the injections and takes ibuprofen 200 mg 2 tablets once per week before the injection. She occasionally feels tired and fatigued. She is a member of a walking club, walking 1 mile twice a day 3 days per week.She is followed by Dr. Shon Baton for cervical spine disease. The patient is being followed by her ophthalmologist Dr. Shea Evans for deeply cupped discs and  last saw him 02/2011. She denies memory loss or depression. She takes her injections on Friday night at work. She has sinus infections that are treated with Avelox and nasal spray by Dr. Annalee Genta. She has chronic fatigue. She denies memory loss or depression.She has  migraine headaches once per month. she had the flu shot this year and flu one week ago treated with TamifluShe is no longer exercise program. She has occasional episodic numbness in the palm of her right hand. She is independent in activities of daily living. She will get repeat MRIs this summer. Last CBC and CMP 02/18/2012 were hemoglobin 12.0,  WBC 12,300, platelet count 313K. CMP normal except potassium 3.1 and albumin 3.3.  REVIEW OF SYSTEMS: Full 14 system review of systems performed and notable only for weight gain moles allergies feeling hot feeling cold headaches insomnia.  ALLERGIES: Allergies  Allergen Reactions  . Novocain [Procaine]   . Relafen  [Nabumetone] Swelling    HOME MEDICATIONS: Prior to Admission medications   Medication Sig Start Date End Date Taking? Authorizing Provider  AVONEX 30 MCG/0.5ML injection Inject 30 mcg into the muscle every 7 (seven) days.  11/28/10  Yes Historical Provider, MD  chlorpheniramine-HYDROcodone (TUSSIONEX) 10-8 MG/5ML LQCR Take 5 mLs by mouth every 6 (six) hours as needed.  11/23/10  Yes Historical Provider, MD  ibuprofen (ADVIL,MOTRIN) 400 MG tablet Take 400 mg by mouth every 8 (eight) hours as needed.     Yes Historical Provider, MD  simvastatin (ZOCOR) 20 MG tablet Take 1 tablet by mouth daily. 10/04/12  Yes Historical Provider, MD  Vitamin D, Ergocalciferol, (DRISDOL) 50000 UNITS CAPS Take 50,000 Units by mouth every 7 (seven) days.  02/03/11  Yes Historical Provider, MD   Outpatient Prescriptions Prior to Visit  Medication Sig Dispense Refill  . AVONEX 30 MCG/0.5ML injection Inject 30 mcg into the muscle every 7 (seven) days.       . chlorpheniramine-HYDROcodone (TUSSIONEX) 10-8 MG/5ML LQCR Take 5 mLs by mouth every 6 (six) hours as needed.       [name] ibuprofen (ADVIL,MOTRIN) 400 MG tablet Take 400 mg by mouth every 8 (eight) hours as needed.        . Vitamin D, Ergocalciferol, (DRISDOL) 50000 UNITS CAPS Take 50,000 Units by mouth every 7 (seven) days.       [name] HYDROcodone-acetaminophen (NORCO) 5-325 MG per tablet Take 1 tablet by mouth every 6 (six) hours as needed.       . tamoxifen (NOLVADEX) 20 MG tablet Take 1 tablet (20 mg total) by mouth daily.  90 tablet  12   No facility-administered medications prior to visit.    PAST MEDICAL HISTORY: Past Medical History  Diagnosis Date  . Sinusitis   . Bronchitis   . Cervical spondylarthritis   . Rhinitis   . Headache(784.0)   . Multiple sclerosis 2009    followed by Dr. Sandria Manly  . Leukocytosis 2009  . High cholesterol     PAST SURGICAL HISTORY: Past Surgical History  Procedure Laterality Date  . Cesarean section      x 3  . Partial  hysterectomy  2006    for uterine fibroid.     FAMILY HISTORY: Family History  Problem Relation Age of Onset  . Cancer Mother 51    ovarian cancer; now with suspected breast cancer  . Diabetes Mother   . Clotting disorder Father   . Cancer Maternal Aunt     breast cancer  . Cancer Maternal Grandmother     unknown female reproductive organ cancer  . Cancer Maternal Aunt     breast cancer  . Cancer Maternal Aunt     breast cancer    SOCIAL HISTORY:  History   Social History  . Marital Status: Married    Spouse Name: John    Number of Children: 2  . Years of Education: College   Occupational History  . chemical byer [employer]   Social History Main Topics  . Smoking status: Never Smoker   . Smokeless tobacco: Never Used  . Alcohol Use: No  . Drug Use: No  . Sexual Activity: Yes  Birth Control/ Protection: None, Surgical   Other Topics Concern  . Not on file   Social History Narrative   Patient lives at home with her spouse.   Caffeine Use: 2 sodas daily     PHYSICAL EXAM  Filed Vitals:   10/06/12 1144  BP: 129/75  Pulse: 67  Temp: 98.3 F (36.8 C)  TempSrc: Oral  Height: 5\' 5"  (1.651 m)  Weight: 207 lb (93.895 kg)    Not recorded    Body mass index is 34.45 kg/(m^2).  GENERAL EXAM: Patient is in no distress  CARDIOVASCULAR: Regular rate and rhythm, no murmurs, no carotid bruits  NEUROLOGIC: MENTAL STATUS: awake, alert, language fluent, comprehension intact, naming intact CRANIAL NERVE: no papilledema on fundoscopic exam, pupils equal and reactive to light, visual fields full to confrontation, extraocular muscles intact, no nystagmus, facial sensation and strength symmetric, uvula midline, shoulder shrug symmetric, tongue midline. MOTOR: normal bulk and tone, full strength in the BUE, BLE SENSORY: normal and symmetric to light touch, pinprick, temperature, vibration COORDINATION: finger-nose-finger, fine finger movements normal REFLEXES:  deep tendon reflexes present and symmetric GAIT/STATION: narrow based gait; able to walk on toes, heels and tandem; romberg is negative   DIAGNOSTIC DATA (LABS, IMAGING, TESTING) - I reviewed patient records, labs, notes, testing and imaging myself where available.  Lab Results  Component Value Date   WBC 12.3* 02/18/2012   HGB 12.0 02/18/2012   HCT 36.6 02/18/2012   MCV 77.3* 02/18/2012   PLT 313 02/18/2012      Component Value Date/Time   NA 142 02/18/2012 1207   NA 139 02/06/2011 1532   K 3.1* 02/18/2012 1207   K 3.2* 02/06/2011 1532   CL 105 02/18/2012 1207   CL 102 02/06/2011 1532   CO2 29 02/18/2012 1207   CO2 30 02/06/2011 1532   GLUCOSE 96 02/18/2012 1207   GLUCOSE 89 02/06/2011 1532   BUN 7.0 02/18/2012 1207   BUN 8 02/06/2011 1532   CREATININE 0.7 02/18/2012 1207   CREATININE 0.61 02/06/2011 1532   CALCIUM 9.5 02/18/2012 1207   CALCIUM 9.5 02/06/2011 1532   PROT 7.6 02/18/2012 1207   PROT 8.0 02/06/2011 1532   ALBUMIN 3.3* 02/18/2012 1207   ALBUMIN 3.4* 02/06/2011 1532   AST 18 02/18/2012 1207   AST 16 02/06/2011 1532   ALT 13 02/18/2012 1207   ALT 17 02/06/2011 1532   ALKPHOS 96 02/18/2012 1207   ALKPHOS 96 02/06/2011 1532   BILITOT 0.45 02/18/2012 1207   BILITOT 0.2* 02/06/2011 1532   No results found for this basename: CHOL, HDL, LDLCALC, LDLDIRECT, TRIG, CHOLHDL   No results found for this basename: HGBA1C   No results found for this basename: VITAMINB12   No results found for this basename: TSH    08/12/12 MRI BRAIN 1. Multiple chronic demyelinating plaques. 2. No acute plaques. 3. Compared to prior MRI on 05/10/07, the left corona radiata lesion is smaller now, and the left periatrial lesion is a new finding.  08/12/12 MRI CERVICAL SPINE  1. Trivial disc bulging C3-4 to C6-7.  2. No spinal stenosis or foraminal narrowing.  3. No intrinsic or abnormal enhancing spinal cord lesions.    ASSESSMENT AND PLAN  52 y.o. year old female here with multiple sclerosis. Doing well on  avonex. Patient interested in changing to oral medication.  PLAN: 1. Consider switch to gilenya or tecfidera (intolerant of injections/avonex); gave patient information packets and referred to [email or web address]org website.   No orders of the  defined types were placed in this encounter.    Meds ordered this encounter  Medications  . simvastatin (ZOCOR) 20 MG tablet    Sig: Take 1 tablet by mouth daily.    Return in about 1 year (around 10/06/2013) for with Heide Guile or Jachin Coury.    Suanne Marker, MD 10/06/2012, 12:36 PM Certified in Neurology, Neurophysiology and Neuroimaging  [facility] [address], [address] [place], [place] [address] [phone]

## 2012-10-07 ENCOUNTER — Encounter: Payer: Self-pay | Admitting: Diagnostic Neuroimaging

## 2012-11-18 ENCOUNTER — Ambulatory Visit: Payer: Self-pay | Admitting: Diagnostic Neuroimaging

## 2013-03-24 ENCOUNTER — Other Ambulatory Visit: Payer: Self-pay | Admitting: *Deleted

## 2013-03-24 DIAGNOSIS — D72829 Elevated white blood cell count, unspecified: Secondary | ICD-10-CM

## 2013-03-25 ENCOUNTER — Ambulatory Visit (HOSPITAL_BASED_OUTPATIENT_CLINIC_OR_DEPARTMENT_OTHER): Payer: BC Managed Care – PPO | Admitting: Oncology

## 2013-03-25 ENCOUNTER — Telehealth: Payer: Self-pay | Admitting: Oncology

## 2013-03-25 ENCOUNTER — Other Ambulatory Visit (HOSPITAL_BASED_OUTPATIENT_CLINIC_OR_DEPARTMENT_OTHER): Payer: BC Managed Care – PPO

## 2013-03-25 ENCOUNTER — Encounter (INDEPENDENT_AMBULATORY_CARE_PROVIDER_SITE_OTHER): Payer: Self-pay

## 2013-03-25 ENCOUNTER — Encounter: Payer: Self-pay | Admitting: Oncology

## 2013-03-25 VITALS — BP 129/82 | HR 92 | Temp 98.8°F | Resp 20 | Ht 65.0 in | Wt 210.5 lb

## 2013-03-25 DIAGNOSIS — Z1501 Genetic susceptibility to malignant neoplasm of breast: Secondary | ICD-10-CM

## 2013-03-25 DIAGNOSIS — D72829 Elevated white blood cell count, unspecified: Secondary | ICD-10-CM

## 2013-03-25 DIAGNOSIS — Z1509 Genetic susceptibility to other malignant neoplasm: Principal | ICD-10-CM

## 2013-03-25 LAB — COMPREHENSIVE METABOLIC PANEL (CC13)
ALT: 11 U/L (ref 0–55)
AST: 16 U/L (ref 5–34)
Albumin: 3.7 g/dL (ref 3.5–5.0)
Alkaline Phosphatase: 96 U/L (ref 40–150)
Anion Gap: 11 mEq/L (ref 3–11)
BILIRUBIN TOTAL: 0.26 mg/dL (ref 0.20–1.20)
BUN: 8.8 mg/dL (ref 7.0–26.0)
CHLORIDE: 105 meq/L (ref 98–109)
CO2: 27 mEq/L (ref 22–29)
CREATININE: 0.8 mg/dL (ref 0.6–1.1)
Calcium: 9.7 mg/dL (ref 8.4–10.4)
Glucose: 81 mg/dl (ref 70–140)
Potassium: 3.8 mEq/L (ref 3.5–5.1)
Sodium: 142 mEq/L (ref 136–145)
Total Protein: 7.8 g/dL (ref 6.4–8.3)

## 2013-03-25 LAB — CBC WITH DIFFERENTIAL/PLATELET
BASO%: 0.9 % (ref 0.0–2.0)
Basophils Absolute: 0.2 10*3/uL — ABNORMAL HIGH (ref 0.0–0.1)
EOS%: 0.3 % (ref 0.0–7.0)
Eosinophils Absolute: 0.1 10*3/uL (ref 0.0–0.5)
HEMATOCRIT: 38.7 % (ref 34.8–46.6)
HGB: 12.3 g/dL (ref 11.6–15.9)
LYMPH#: 5.7 10*3/uL — AB (ref 0.9–3.3)
LYMPH%: 27.1 % (ref 14.0–49.7)
MCH: 24.6 pg — AB (ref 25.1–34.0)
MCHC: 31.8 g/dL (ref 31.5–36.0)
MCV: 77.2 fL — ABNORMAL LOW (ref 79.5–101.0)
MONO#: 1 10*3/uL — ABNORMAL HIGH (ref 0.1–0.9)
MONO%: 4.7 % (ref 0.0–14.0)
NEUT#: 14 10*3/uL — ABNORMAL HIGH (ref 1.5–6.5)
NEUT%: 67 % (ref 38.4–76.8)
Platelets: 331 10*3/uL (ref 145–400)
RBC: 5.01 10*6/uL (ref 3.70–5.45)
RDW: 15.7 % — ABNORMAL HIGH (ref 11.2–14.5)
WBC: 20.9 10*3/uL — ABNORMAL HIGH (ref 3.9–10.3)

## 2013-03-25 NOTE — Patient Instructions (Signed)
BRCA-1 and BRCA-2 BRCA-1 and BRCA-2 are 2 genes that are linked with hereditary breast and ovarian cancers. About 200,000 women are diagnosed with invasive breast cancer each year and about 23,000 with ovarian cancer (according to the American Cancer Society). Of these cancers, about 5% to 10% will be due to a mutation in one of the BRCA genes. Men can also inherit an increased risk of developing breast cancer, primarily from an alteration in the BRCA-2 gene.  Individuals with mutations in BRCA1 or BRCA2 have significantly elevated risks for breast cancer (up to 80% lifetime risk), ovarian cancer (up to 40% lifetime risk), bilateral breast cancer and other types of cancers. BRCA mutations are inherited and passed from generation to generation. One half of the time, they are passed from the father's side of the family.  The DNA in white blood cells is used to detect mutations in the BRCA genes. While the gene products (proteins) of the BRCA genes act only in breast and ovarian tissue, the genes are present in every cell of the body and blood is the most easily accessible source of that DNA. PREPARATION FOR TEST The test for BRCA mutations is done on a blood sample collected by needle from a vein in the arm. The test does not require surgical biopsy of breast or ovarian tissue.  NORMAL FINDINGS No genetic mutations. Ranges for normal findings may vary among different laboratories and hospitals. You should always check with your doctor after having lab work or other tests done to discuss the meaning of your test results and whether your values are considered within normal limits. MEANING OF TEST  Your caregiver will go over the test results with you and discuss the importance and meaning of your results, as well as treatment options and the need for additional tests if necessary. OBTAINING THE TEST RESULTS It is your responsibility to obtain your test results. Ask the lab or department performing the test  when and how you will get your results. OTHER THINGS TO KNOW Your test results may have implications for other family members. When one member of a family is tested for BRCA mutations, issues often arise about how or whether to share this information with other family members. Seek advice from a genetic counselor about communication of result with your family members.  Pre and post test consultation with a health care provider knowledgeable about genetic testing cannot be overemphasized.  There are many issues to be considered when preparing for a genetic test and upon learning the results, and a genetic counselor has the knowledge and experience to help you sort through them.  If the BRCA test is positive, the options include increased frequency of check-ups (e.g., mammography, blood tests for CA-125, or transvaginal ultrasonography); medications that could reduce risk (e.g., oral contraceptives or tamoxifen); or surgical removal of the ovaries or breasts. There are a number of variables involved and it is important to discuss your options with your doctor and genetic counselor. Research studies have reported that for every 1000 women negative for BRCA mutations, between 12 and 45 of them will develop breast cancer by age 50 and between 3 and 4 will develop ovarian cancer by age 50. The risk increases with age. The test can be ordered by a doctor, preferably by one who can also offer genetic counseling. The blood sample will be sent to a laboratory that specializes in BRCA testing. The American Society of Clinical Oncology and the National Breast Cancer Coalition encourage women seeking the   test to participate in long-term outcome studies to help gather information on the effectiveness of different check-up and treatment options. Document Released: 02/22/2004 Document Revised: 04/22/2011 Document Reviewed: 01/04/2008 Exit[employer] Patient Information 2014 [employer].  

## 2013-03-25 NOTE — Telephone Encounter (Signed)
, °

## 2013-03-28 NOTE — Progress Notes (Signed)
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  PRIOR THERAPY:  #1 patient was originally seen in the high-risk clinic for discussion of risk reduction for breast cancer and ovarian cancer. She was given a prescription for tamoxifen for chemoprevention. Patient also was recommended getting ongoing mammograms as well as breast MRIs. She also had discussion regarding risk reducing surgery for by a lateral prophylactic mastectomy and bilateral prophylactic salpingo-oophorectomy. On her last visit patient had stated that she was going to speak to Dr. Ruthann Cancer regarding the possibility of having bilateral salpingo-oophorectomies/hysterectomy performed. However she did not get in touch with him. She has also not had an MRI performed as recommended. But she did get a mammogram. Patient was recommended chemoprevention with tamoxifen 20 mg daily but she has decided not to take it.   #2  chronic leukocytosis: Observation  CURRENT THERAPY:observation  INTERVAL HISTORY: Jacqueline Adams 53 y.o. female returns for Followup visit. Clinically patient seems to be doing well. She continues to be seen by her OB/GYN. She did have an mammogram performed which was normal. But she has not had an MRI done. She also has not had ovarian surgery performed prophylactically. She also discontinued taking tamoxifen. She at this time is not interested in restarting it. She in fact tells me that she had the prescription but did not take it out from her pharmacy. She otherwise denies any headaches double vision blurring of vision fevers chills night sweats  Shortness of breath chest pains palpitations no cough hemoptysis hematemesis no abdominal pain. She has not noted any masses in her breasts. She does do self breast examinations. She has not noticed any abnormal vaginal bleeding. No abdominal or  pelvic pain. Remainder of the 10 point review of systems is negative. MEDICAL HISTORY: Past Medical History  Diagnosis Date  . Sinusitis   . Bronchitis   . Cervical spondylarthritis   . Rhinitis   . Headache(784.0)   . Multiple sclerosis 2009    followed by Dr. Erling Cruz  . Leukocytosis 2009  . High cholesterol     ALLERGIES:  is allergic to novocain and relafen.  MEDICATIONS:  Current Outpatient Prescriptions  Medication Sig Dispense Refill  . amoxicillin-clavulanate (AUGMENTIN) 875-125 MG per tablet       . chlorpheniramine-HYDROcodone (TUSSIONEX) 10-8 MG/5ML LQCR Take 5 mLs by mouth every 6 (six) hours as needed.       . fluconazole (DIFLUCAN) 150 MG tablet       . ibuprofen (ADVIL,MOTRIN) 400 MG tablet Take 400 mg by mouth every 8 (eight) hours as needed.        . simvastatin (ZOCOR) 20 MG tablet Take 1 tablet by mouth daily.      . TECFIDERA 240 MG CPDR       . Vitamin D, Ergocalciferol, (DRISDOL) 50000 UNITS CAPS Take 50,000 Units by mouth every 7 (seven) days.        No current facility-administered medications for this visit.    SURGICAL HISTORY:  Past Surgical History  Procedure Laterality Date  . Cesarean section      x 3  . Partial hysterectomy  2006    for uterine fibroid.     REVIEW OF SYSTEMS:  Pertinent items are noted in HPI.   HEALTH MAINTENANCE:  MammogramDeborah 26 2013 ColonoscopyNot Bone  Scan n/a Pap SmearUp-to-date   PHYSICAL EXAMINATION: Blood pressure 129/82, pulse  92, temperature 98.8 F (37.1 C), temperature source Oral, resp. rate 20, height 5' 5"  (1.651 m), weight 210 lb 8 oz (95.482 kg). Body mass index is 35.03 kg/(m^2). ECOG PERFORMANCE STATUS: 0 - Asymptomatic   General appearance: alert, cooperative and appears stated age Resp: clear to auscultation bilaterally Cardio: regular rate and rhythm GI: soft, non-tender; bowel sounds normal; no masses,  no organomegaly Neurologic: Grossly normal BREAST EXAM: In the supine position,  with the right arm over the head, right nipple is everted. No periareolar edema or nipple discharge. No mass in any quadrant or subareolar region. No redness of the skin. No right axillary adenopathy. With the left arm over the head, left nipple is everted. No periareolar edema or nipple discharge. No mass in any quadrant or subareolar region. No redness of the skin. No left axillary adenopathy. Breasts are dense for age.    LABORATORY DATA: Lab Results  Component Value Date   WBC 20.9* 03/25/2013   HGB 12.3 03/25/2013   HCT 38.7 03/25/2013   MCV 77.2* 03/25/2013   PLT 331 03/25/2013      Chemistry      Component Value Date/Time   NA 142 03/25/2013 1410   NA 139 02/06/2011 1532   K 3.8 03/25/2013 1410   K 3.2* 02/06/2011 1532   CL 105 02/18/2012 1207   CL 102 02/06/2011 1532   CO2 27 03/25/2013 1410   CO2 30 02/06/2011 1532   BUN 8.8 03/25/2013 1410   BUN 8 02/06/2011 1532   CREATININE 0.8 03/25/2013 1410   CREATININE 0.61 02/06/2011 1532      Component Value Date/Time   CALCIUM 9.7 03/25/2013 1410   CALCIUM 9.5 02/06/2011 1532   ALKPHOS 96 03/25/2013 1410   ALKPHOS 96 02/06/2011 1532   AST 16 03/25/2013 1410   AST 16 02/06/2011 1532   ALT 11 03/25/2013 1410   ALT 17 02/06/2011 1532   BILITOT 0.26 03/25/2013 1410   BILITOT 0.2* 02/06/2011 1532       RADIOGRAPHIC STUDIES:  No results found.  ASSESSMENT/PLAN: 53 y/o black female with:  1. Known BRCA-2 mutation with other family members affected. Has native breasts and retained ovaries.  2. Normal mammogram Feb. 2014.  3. Ovarian cysts (by pts report) on ultrasound of the ovaries, February 2013.  4. She wishes to preserve breasts at this time, is willing to undergo salpingo-oophorectomy. She wishes to contact Dr. Ruthann Cancer herself to arrange salpingo-oophorectomies.  5. Patient does not want to go on tamoxifen at this time 6. Patient will proceed with MRI and mammogram this year.     All questions were answered. The patient  knows to call the clinic with any problems, questions or concerns. We can certainly see the patient much sooner if necessary.  I spent 25 minutes counseling the patient face to face. The total time spent in the appointment was 30 minutes.    Marcy Panning, MD Medical/Oncology [facility] [phone] (beeper) [phone] (Office)

## 2013-04-05 ENCOUNTER — Encounter: Payer: Self-pay | Admitting: Internal Medicine

## 2013-04-05 ENCOUNTER — Ambulatory Visit (INDEPENDENT_AMBULATORY_CARE_PROVIDER_SITE_OTHER): Payer: BC Managed Care – PPO | Admitting: Internal Medicine

## 2013-04-05 ENCOUNTER — Ambulatory Visit (INDEPENDENT_AMBULATORY_CARE_PROVIDER_SITE_OTHER)
Admission: RE | Admit: 2013-04-05 | Discharge: 2013-04-05 | Disposition: A | Discharge status: Home / Self Care | Payer: BC Managed Care – PPO | Source: Ambulatory Visit | Attending: Internal Medicine | Admitting: Internal Medicine

## 2013-04-05 VITALS — BP 120/88 | HR 76 | Ht 64.0 in | Wt 214.8 lb

## 2013-04-05 DIAGNOSIS — R05 Cough: Secondary | ICD-10-CM

## 2013-04-05 DIAGNOSIS — R059 Cough, unspecified: Secondary | ICD-10-CM

## 2013-04-05 MED ORDER — FLUTICASONE PROPIONATE 50 MCG/ACT NA SUSP: 2.0000 | Freq: Every day | NASAL | Status: AC

## 2013-04-05 MED ORDER — HYDROCODONE-HOMATROPINE 5-1.5 MG/5ML PO SYRP: 5.0000 mL | ORAL_SOLUTION | Freq: Two times a day (BID) | ORAL | Status: DC

## 2013-04-05 MED ORDER — OMEPRAZOLE-SODIUM BICARBONATE 20-1100 MG PO CAPS: 1.0000 | ORAL_CAPSULE | Freq: Every day | ORAL | Status: DC

## 2013-04-05 NOTE — Progress Notes (Signed)
Subjective:    Patient ID: Jacqueline Adams, female    DOB: 08/23/1960, 53 y.o.   MRN: [id number]  PCP Robyne Peers., MD   HPI  IOV 04/05/2013    Chief Complaint  Patient presents with  . Pulmonary Consult    pt c/o having sob, chest tightness, hoarseness, cough since 02-09-13.    53 year old female on 02/09/2013 developed acute influenza and was confirmed by her primary care physician. She did take Tamiflu but shortly after that she started having cough. Apparently 02/12/2013 chest x-ray showed some congestion and she was initially given some antibiotic probably Levaquin. Over the next few to several days she did improve in terms of acute symptomatology but was left with some fatigue and cough. The cough has been the biggest problem even to this date. Initially cough was severe. Currently cough is improved 60% but is still significant enough that she's been troubled by it. Cough is rated as moderate in severity. It is present in both day and night. It can happen anytime. Courses one of improvement. It is dry in quality. 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 Cough is associated with postnasal drainage, feeling of ticklishness in the throat, clearing of the throat and occasional gag. No specific wheeze or shortness of breath. No specific relieving factors including a second course of antibiotics was in the January 2015 at which time she also got a dose of Depo-Medrol.  She's not noted be on ACE inhibitors   She significant sinus drainage that is untreated  She denies any acid reflux and is not on any treatment for the same but her diet is probably one that stimulates GERD  Dg Chest 2 View  04/05/2013   CLINICAL DATA:  Chronic cough  EXAM: CHEST  2 VIEW  COMPARISON:  DG CHEST 2V dated 02/12/2013  FINDINGS: The heart size and mediastinal contours are within normal limits.  Both lungs are clear. The visualized skeletal structures are unremarkable.  IMPRESSION: No active cardiopulmonary disease.   Electronically Signed   By: Margaree Mackintosh M.D.   On: 04/05/2013 16:36       Past Medical History  Diagnosis Date  . Sinusitis   . Bronchitis   . Cervical spondylarthritis   . Rhinitis   . Headache(784.0)   . Multiple sclerosis 2009    followed by Dr. Erling Cruz  . Leukocytosis 2009  . High cholesterol      Family History  Problem Relation Age of Onset  . Cancer Mother 58    ovarian cancer; now with suspected breast cancer  . Diabetes Mother   . Clotting disorder Father   . Cancer Maternal Aunt     breast cancer  . Cancer Maternal Grandmother     unknown female reproductive organ cancer  . Cancer Maternal Aunt     breast cancer  . Cancer Maternal Aunt     breast cancer     History   Social History  . Marital Status: Married    Spouse Name: John    Number of Children: 2  . Years of Education: College   Occupational History  . chemical byer [employer] History Main Topics  . Smoking status: Never Smoker   . Smokeless tobacco: Never Used  . Alcohol Use: No  . Drug Use: No  . Sexual Activity: Yes  Birth Control/ Protection: None, Surgical   Other Topics Concern  . Not on file   Social History Narrative   Patient lives at home with her spouse.   Caffeine Use: 2 sodas daily     Allergies  Allergen Reactions  . Novocain [Procaine]   . Relafen [Nabumetone] Swelling     Outpatient Prescriptions Prior to Visit  Medication Sig Dispense Refill  . chlorpheniramine-HYDROcodone (TUSSIONEX) 10-8 MG/5ML LQCR Take 5 mLs by mouth every 6 (six) hours as needed.       . fluconazole (DIFLUCAN) 150 MG tablet       . ibuprofen (ADVIL,MOTRIN) 400 MG tablet Take 400 mg by mouth every 8 (eight) hours as needed.        . simvastatin (ZOCOR) 20 MG tablet Take 1 tablet by mouth daily.      . TECFIDERA 240 MG CPDR Take 1 capsule by mouth 2 (two)  times daily.       . Vitamin D, Ergocalciferol, (DRISDOL) 50000 UNITS CAPS Take 50,000 Units by mouth every 7 (seven) days.       [name] amoxicillin-clavulanate (AUGMENTIN) 875-125 MG per tablet        No facility-administered medications prior to visit.       Review of Systems  Constitutional: Negative for fever and unexpected weight change.  HENT: Negative for congestion, dental problem, ear pain, nosebleeds, postnasal drip, rhinorrhea, sinus pressure, sneezing, sore throat and trouble swallowing.   Eyes: Negative for redness and itching.  Respiratory: Positive for cough and shortness of breath. Negative for chest tightness and wheezing.   Cardiovascular: Negative for palpitations and leg swelling.  Gastrointestinal: Negative for nausea and vomiting.  Genitourinary: Negative for dysuria.  Musculoskeletal: Negative for joint swelling.  Skin: Negative for rash.  Neurological: Positive for headaches.  Hematological: Does not bruise/bleed easily.  Psychiatric/Behavioral: Negative for dysphoric mood. The patient is not nervous/anxious.        Objective:   Physical Exam  Vitals reviewed. Constitutional: She is oriented to person, place, and time. She appears well-developed and well-nourished. No distress.  Body mass index is 36.85 kg/(m^2).   HENT:  Head: Normocephalic and atraumatic.  Right Ear: External ear normal.  Left Ear: External ear normal.  Mouth/Throat: Oropharynx is clear and moist. No oropharyngeal exudate.  Clears throat Turbinates swollent Post nasal drainge +  Eyes: Conjunctivae and EOM are normal. Pupils are equal, round, and reactive to light. Right eye exhibits no discharge. Left eye exhibits no discharge. No scleral icterus.  Neck: Normal range of motion. Neck supple. No JVD present. No tracheal deviation present. No thyromegaly present.  Cardiovascular: Normal rate, regular rhythm, normal heart sounds and intact distal pulses.  Exam reveals no gallop and no  friction rub.   No murmur heard. Pulmonary/Chest: Effort normal and breath sounds normal. No respiratory distress. She has no wheezes. She has no rales. She exhibits no tenderness.  Abdominal: Soft. Bowel sounds are normal. She exhibits no distension and no mass. There is no tenderness. There is no rebound and no guarding.  Musculoskeletal: Normal range of motion. She exhibits no edema and no tenderness.  Lymphadenopathy:    She has no cervical adenopathy.  Neurological: She is alert and oriented to person, place, and time. She has normal reflexes. No cranial nerve deficit. She exhibits normal muscle tone. Coordination normal.  Skin: Skin is warm and dry. No rash noted. She is not diaphoretic. No erythema. No pallor.  Psychiatric: She has a normal mood and affect.  Her behavior is normal. Judgment and thought content normal.          Assessment & Plan:

## 2013-04-05 NOTE — Patient Instructions (Signed)
Cough is from post viral reactive cough that should go away by itself IT can be made chronic by sinus drainage, acid reflux, and vocal cord dysfunction  #Sinus drainage  - start/continue netti pot daily  - start nasal steroid generic fluticasone inhaler 2 squirts each nostril daily as advised  - start OC sudafed 12h XL one tablet daily   #Possible Acid Reflux  - take otc zegerid 20mg  1 capsule daily on empty stomach (nurse will send script)   -- avoid colas, spices, cheeses, spirits, red meats, beer, chocolates, fried foods etc.,   - sleep with head end of bed elevated  - eat small frequent meals  - do not go to bed for 3 hours after last meal  #Cyclical cough  - please choose 2-3 days and observe complete voice rest - no talking or whispering - also take  Hycodan 5mL Twice daily x 4 days  - take sugarless lozenges and delsym as needed  - at all times there  there is urge to cough, drink water or swallow or sip on throat lozenge  #Do CXR 2 view  #Followup - I will see you in 4-6 weeks.  - any problems call or come sooner  

## 2013-04-06 DIAGNOSIS — R05 Cough: Secondary | ICD-10-CM | POA: Insufficient documentation

## 2013-04-06 DIAGNOSIS — R059 Cough, unspecified: Secondary | ICD-10-CM | POA: Insufficient documentation

## 2013-04-06 NOTE — Assessment & Plan Note (Signed)
Cough is from post viral reactive cough that should go away by itself IT can be made chronic by sinus drainage, acid reflux, and vocal cord dysfunction  #Sinus drainage  - start/continue netti pot daily  - start nasal steroid generic fluticasone inhaler 2 squirts each nostril daily as advised  - start OC sudafed 12h XL one tablet daily   #Possible Acid Reflux  - take otc zegerid 20mg   1 capsule daily on empty stomach (nurse will send script)   -- avoid colas, spices, cheeses, spirits, red meats, beer, chocolates, fried foods etc.,   - sleep with head end of bed elevated  - eat small frequent meals  - do not go to bed for 3 hours after last meal  #Cyclical cough  - please choose 2-3 days and observe complete voice rest - no talking or whispering - also take  Hycodan 50mL Twice daily x 4 days  - take sugarless lozenges and delsym as needed  - at all times there  there is urge to cough, drink water or swallow or sip on throat lozenge  #Do CXR 2 view  #Followup - I will see you in 4-6 weeks.  - any problems call or come sooner

## 2013-04-07 ENCOUNTER — Telehealth: Payer: Self-pay | Admitting: Internal Medicine

## 2013-04-07 NOTE — Telephone Encounter (Signed)
Notes Recorded by Lilli Few, CMA on 04/07/2013 at 4:36 PM [id number]. [place] Bing, CMA  ------  Notes Recorded by Brand Males, MD on 04/06/2013 at [facility] [place], Please let patient know it is normal result      Pt is aware of results.

## 2013-04-21 NOTE — Telephone Encounter (Signed)
Pt came in for her visit closing encounter °

## 2013-04-26 ENCOUNTER — Ambulatory Visit
Admission: RE | Admit: 2013-04-26 | Discharge: 2013-04-26 | Disposition: A | Discharge status: Home / Self Care | Payer: BC Managed Care – PPO | Source: Ambulatory Visit | Attending: Oncology | Admitting: Oncology

## 2013-04-26 DIAGNOSIS — Z1501 Genetic susceptibility to malignant neoplasm of breast: Secondary | ICD-10-CM

## 2013-04-26 DIAGNOSIS — Z1509 Genetic susceptibility to other malignant neoplasm: Principal | ICD-10-CM

## 2013-04-27 ENCOUNTER — Other Ambulatory Visit: Payer: Self-pay | Admitting: Oncology

## 2013-04-27 DIAGNOSIS — R928 Other abnormal and inconclusive findings on diagnostic imaging of breast: Secondary | ICD-10-CM

## 2013-05-03 ENCOUNTER — Encounter: Payer: Self-pay | Admitting: Internal Medicine

## 2013-05-03 ENCOUNTER — Ambulatory Visit (INDEPENDENT_AMBULATORY_CARE_PROVIDER_SITE_OTHER): Payer: BC Managed Care – PPO | Admitting: Internal Medicine

## 2013-05-03 VITALS — BP 112/80 | HR 93 | Ht 64.0 in | Wt 213.6 lb

## 2013-05-03 DIAGNOSIS — R05 Cough: Secondary | ICD-10-CM

## 2013-05-03 DIAGNOSIS — R059 Cough, unspecified: Secondary | ICD-10-CM

## 2013-05-03 NOTE — Progress Notes (Signed)
Subjective:    Patient ID: Jacqueline Adams, female    DOB: 06/15/1960, 53 y.o.   MRN: [id number]  HPI  PCP Robyne Peers., MD   HPI  IOV 04/05/2013    Chief Complaint  Patient presents with  . Pulmonary Consult    pt c/o having sob, chest tightness, hoarseness, cough since 02-09-13.    53 year old female on 02/09/2013 developed acute influenza and was confirmed by her primary care physician. She did take Tamiflu but shortly after that she started having cough. Apparently 02/12/2013 chest x-ray showed some congestion and she was initially given some antibiotic probably Levaquin. Over the next few to several days she did improve in terms of acute symptomatology but was left with some fatigue and cough. The cough has been the biggest problem even to this date. Initially cough was severe. Currently cough is improved 60% but is still significant enough that she's been troubled by it. Cough is rated as moderate in severity. It is present in both day and night. It can happen anytime. Courses one of improvement. It is dry in quality. 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 Cough is associated with postnasal drainage, feeling of ticklishness in the throat, clearing of the throat and occasional gag. No specific wheeze or shortness of breath. No specific relieving factors including a second course of antibiotics was in the January 2015 at which time she also got a dose of Depo-Medrol.  She's not noted be on ACE inhibitors   She significant sinus drainage that is untreated  She denies any acid reflux and is not on any treatment for the same but her diet is probably one that stimulates GERD  Dg Chest 2 View  04/05/2013   CLINICAL DATA:  Chronic cough  EXAM: CHEST  2 VIEW  COMPARISON:  DG CHEST 2V dated 02/12/2013  FINDINGS: The heart size and mediastinal contours are within normal  limits. Both lungs are clear. The visualized skeletal structures are unremarkable.  IMPRESSION: No active cardiopulmonary disease.   Electronically Signed   By: Margaree Mackintosh M.D.   On: 04/05/2013 16:36    REC Cough is from post viral reactive cough that should go away by itself IT can be made chronic by sinus drainage, acid reflux, and vocal cord dysfunction  #Sinus drainage  - start/continue netti pot daily  - start nasal steroid generic fluticasone inhaler 2 squirts each nostril daily as advised  - start OC sudafed 12h XL one tablet daily   #Possible Acid Reflux  - take otc zegerid 20mg   1 capsule daily on empty stomach (nurse will send script)   -- avoid colas, spices, cheeses, spirits, red meats, beer, chocolates, fried foods etc.,   - sleep with head end of bed elevated  - eat small frequent meals  - do not go to bed for 3 hours after last meal  #Cyclical cough  - please choose 2-3 days and observe complete voice rest - no talking or whispering - also take  Hycodan 31mL Twice daily x 4 days  - take sugarless lozenges and delsym as needed  - at all times there  there is urge to cough, drink water or swallow or sip on throat lozenge  #Do CXR 2 view  #Followup - I will see you in 4-6 weeks.  - any problems call or come sooner  OV 05/03/2013  Chief Complaint  Patient presents with  . Cough    pt states cough is 80% improved.      Review of Systems  Constitutional: Negative for fever and unexpected weight change.  HENT: Negative for congestion, dental problem, ear pain, nosebleeds, postnasal drip, rhinorrhea, sinus pressure, sneezing, sore throat and trouble swallowing.   Eyes: Negative for redness and itching.  Respiratory: Positive for cough. Negative for chest tightness, shortness of breath and wheezing.   Cardiovascular: Negative for palpitations and leg swelling.  Gastrointestinal: Negative for nausea and vomiting.  Genitourinary: Negative for dysuria.   Musculoskeletal: Negative for joint swelling.  Skin: Negative for rash.  Neurological: Negative for headaches.  Hematological: Does not bruise/bleed easily.  Psychiatric/Behavioral: Negative for dysphoric mood. The patient is not nervous/anxious.        Objective:   Physical Exam     Subjective:    Patient ID: Jacqueline Adams, female    DOB: 11-15-1960, 77 y.o.   MRN: [id number]  PCP Robyne Peers., MD   HPI  IOV 05/03/2013    Chief Complaint  Patient presents with  . Cough    pt states cough is 80% improved.     53 year old obese female followup chronic cough related to post influenza  Since last visit her cough is improved 80%. She says for the passage of time and using sinus drainage treatment with Flonase she is better. She is not fully compliant with the Flonase and antihistamines. She is not using a saline wash. She says that the sinus drainage improved but now the onset of allergy season it has gotten worse again. She is compliant with acid reflux medication. Currently RSI cough score is 19 and reflects below. This is an 80% improvement from baseline and is on her last time.  Ffatigue is still only 50% better. This seems to bother her more now than cough. She says that she's had her thyroid function and vitamin D evaluation. She denies any sleep apnea or snoring. She will followup with fatigue with her husband. She is sedentary and not exercising  Dr Lorenza Cambridge Reflux Symptom Index (> 13-15 suggestive of LPR cough) 0 -> 5  =  none ->severe problem 05/03/2013   Hoarseness of problem with voice 4  Clearing  Of Throat 3  Excess throat mucus or feeling of post nasal drip 3  Difficulty swallowing food, liquid or tablets 0  Cough after eating or lying down 2  Breathing difficulties or choking episodes 2  Troublesome or annoying cough 2  Sensation of something sticking in throat or lump in throat 1  Heartburn, chest pain, indigestion, or stomach acid coming up 2   TOTAL 19       Past Medical History  Diagnosis Date  . Sinusitis   . Bronchitis   . Cervical spondylarthritis   . Rhinitis   . Headache(784.0)   . Multiple sclerosis 2009    followed by Dr. Erling Cruz  . Leukocytosis 2009  . High cholesterol      Family History  Problem Relation Age of Onset  . Cancer Mother 59    ovarian cancer; now with suspected breast cancer  . Diabetes Mother   . Clotting disorder Father   . Cancer Maternal Aunt     breast cancer  . Cancer Maternal Grandmother     unknown female reproductive organ cancer  . Cancer Maternal Aunt     breast cancer  . Cancer Maternal Aunt     breast  cancer     History   Social History  . Marital Status: Married    Spouse Name: John    Number of Children: 2  . Years of Education: College   Occupational History  . 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 Smoking status: Never Smoker   . Smokeless tobacco: Never Used  . Alcohol Use: No  . Drug Use: No  . Sexual Activity: Yes    Birth Control/ Protection: None, Surgical   Other Topics Concern  . Not on file   Social History Narrative   Patient lives at home with her spouse.   Caffeine Use: 2 sodas daily     Allergies  Allergen Reactions  . Novocain [Procaine]   . Relafen [Nabumetone] Swelling     Outpatient Prescriptions Prior to Visit  Medication Sig Dispense Refill  . chlorpheniramine-HYDROcodone (TUSSIONEX) 10-8 MG/5ML LQCR Take 5 mLs by mouth every 6 (six) hours as needed.       . fluconazole (DIFLUCAN) 150 MG tablet       . fluticasone (FLONASE) 50 MCG/ACT nasal spray Place 2 sprays into both nostrils daily.  16 g  2  . HYDROcodone-homatropine (HYCODAN) 5-1.5 MG/5ML syrup Take 5 mLs by mouth 2 (two) times daily.  40 mL  0  . ibuprofen (ADVIL,MOTRIN) 400 MG tablet Take 400 mg by mouth every 8 (eight) hours as needed.        Earney Navy Bicarbonate (ZEGERID) 20-1100 MG CAPS capsule Take 1 capsule by mouth daily before  breakfast.  30 each  6  . simvastatin (ZOCOR) 20 MG tablet Take 1 tablet by mouth daily.      . TECFIDERA 240 MG CPDR Take 1 capsule by mouth 2 (two) times daily.       . Vitamin D, Ergocalciferol, (DRISDOL) 50000 UNITS CAPS Take 50,000 Units by mouth every 7 (seven) days.        No facility-administered medications prior to visit.       Review of Systems  Constitutional: Negative for fever and unexpected weight change.  HENT: Negative for congestion, dental problem, ear pain, nosebleeds, postnasal drip, rhinorrhea, sinus pressure, sneezing, sore throat and trouble swallowing.   Eyes: Negative for redness and itching.  Respiratory: Positive for cough and shortness of breath. Negative for chest tightness and wheezing.   Cardiovascular: Negative for palpitations and leg swelling.  Gastrointestinal: Negative for nausea and vomiting.  Genitourinary: Negative for dysuria.  Musculoskeletal: Negative for joint swelling.  Skin: Negative for rash.  Neurological: Positive for headaches.  Hematological: Does not bruise/bleed easily.  Psychiatric/Behavioral: Negative for dysphoric mood. The patient is not nervous/anxious.        Objective:   Physical Exam  Vitals reviewed. Constitutional: She is oriented to person, place, and time. She appears well-developed and well-nourished. No distress.  Body mass index is 36.85 kg/(m^2).   HENT:  Head: Normocephalic and atraumatic.  Right Ear: External ear normal.  Left Ear: External ear normal.  Mouth/Throat: Oropharynx is clear and moist. No oropharyngeal exudate.   does not clear throat anymore   some cruds in the nose but improved postnasal drainage and turbinates    Eyes: Conjunctivae and EOM are normal. Pupils are equal, round, and reactive to light. Right eye exhibits no discharge. Left eye exhibits no discharge. No scleral icterus.  Neck: Normal range of motion. Neck supple. No JVD present. No tracheal deviation present. No thyromegaly  present.  Cardiovascular: Normal rate, regular rhythm, normal heart  sounds and intact distal pulses.  Exam reveals no gallop and no friction rub.   No murmur heard. Pulmonary/Chest: Effort normal and breath sounds normal. No respiratory distress. She has no wheezes. She has no rales. She exhibits no tenderness.  Abdominal: Soft. Bowel sounds are normal. She exhibits no distension and no mass. There is no tenderness. There is no rebound and no guarding.  Musculoskeletal: Normal range of motion. She exhibits no edema and no tenderness.  Lymphadenopathy:    She has no cervical adenopathy.  Neurological: She is alert and oriented to person, place, and time. She has normal reflexes. No cranial nerve deficit. She exhibits normal muscle tone. Coordination normal.  Skin: Skin is warm and dry. No rash noted. She is not diaphoretic. No erythema. No pallor.  Psychiatric: She has a normal mood and affect. Her behavior is normal. Judgment and thought content normal.        Assessment & Plan:

## 2013-05-03 NOTE — Patient Instructions (Signed)
Glad you are better with cough  - continue flonase and netti pot through allergy season   - you can wean off acid reflux meds and see how it does with cough  REgarding fatigue  - start exercising  - discuss with PCP Robyne Peers., MD  Followup  No active followup unless  Worse or not better

## 2013-05-04 ENCOUNTER — Ambulatory Visit
Admission: RE | Admit: 2013-05-04 | Discharge: 2013-05-04 | Disposition: A | Discharge status: Home / Self Care | Payer: BC Managed Care – PPO | Source: Ambulatory Visit | Attending: Oncology | Admitting: Oncology

## 2013-05-04 ENCOUNTER — Other Ambulatory Visit: Payer: Self-pay | Admitting: Oncology

## 2013-05-04 ENCOUNTER — Other Ambulatory Visit: Payer: Self-pay

## 2013-05-04 DIAGNOSIS — Z1509 Genetic susceptibility to other malignant neoplasm: Principal | ICD-10-CM

## 2013-05-04 DIAGNOSIS — R928 Other abnormal and inconclusive findings on diagnostic imaging of breast: Secondary | ICD-10-CM

## 2013-05-04 DIAGNOSIS — Z1501 Genetic susceptibility to malignant neoplasm of breast: Secondary | ICD-10-CM

## 2013-05-04 MED ORDER — GADOBENATE DIMEGLUMINE 529 MG/ML IV SOLN
19.0000 mL | Freq: Once | INTRAVENOUS | Status: AC | PRN
  Administered 2013-05-04: 19 mL via INTRAVENOUS

## 2013-05-06 ENCOUNTER — Encounter: Payer: Self-pay | Admitting: Internal Medicine

## 2013-05-10 ENCOUNTER — Ambulatory Visit
Admission: RE | Admit: 2013-05-10 | Discharge: 2013-05-10 | Disposition: A | Discharge status: Home / Self Care | Payer: Self-pay | Source: Ambulatory Visit | Attending: Oncology | Admitting: Oncology

## 2013-05-10 ENCOUNTER — Ambulatory Visit
Admission: RE | Admit: 2013-05-10 | Discharge: 2013-05-10 | Disposition: A | Discharge status: Home / Self Care | Payer: BC Managed Care – PPO | Source: Ambulatory Visit | Attending: Oncology | Admitting: Oncology

## 2013-05-10 DIAGNOSIS — R928 Other abnormal and inconclusive findings on diagnostic imaging of breast: Secondary | ICD-10-CM

## 2013-05-11 NOTE — Assessment & Plan Note (Signed)
Glad you are better with cough  - continue flonase and netti pot through allergy season   - you can wean off acid reflux meds and see how it does with cough  REgarding fatigue  - start exercising  - discuss with PCP AGUIAR,RAFAELA M., MD  Followup  No active followup unless  Worse or not better    

## 2013-06-15 ENCOUNTER — Encounter: Payer: BC Managed Care – PPO | Admitting: Internal Medicine

## 2013-07-12 ENCOUNTER — Ambulatory Visit (AMBULATORY_SURGERY_CENTER): Payer: Self-pay | Admitting: *Deleted

## 2013-07-12 VITALS — Ht 64.0 in | Wt 211.8 lb

## 2013-07-12 DIAGNOSIS — Z1211 Encounter for screening for malignant neoplasm of colon: Secondary | ICD-10-CM

## 2013-07-12 MED ORDER — MOVIPREP 100 G PO SOLR: ORAL | Status: DC

## 2013-07-12 NOTE — Progress Notes (Signed)
No allergies to eggs or soy. No problems with anesthesia.  Pt given Emmi instructions for colonoscopy  No oxygen use  No diet drug use  

## 2013-07-19 ENCOUNTER — Encounter: Payer: Self-pay | Admitting: Internal Medicine

## 2013-07-26 ENCOUNTER — Encounter: Payer: Self-pay | Admitting: Internal Medicine

## 2013-07-26 ENCOUNTER — Ambulatory Visit (AMBULATORY_SURGERY_CENTER): Payer: BC Managed Care – PPO | Admitting: Internal Medicine

## 2013-07-26 VITALS — BP 125/75 | HR 81 | Temp 98.4°F | Resp 21 | Ht 64.0 in | Wt 211.0 lb

## 2013-07-26 DIAGNOSIS — D126 Benign neoplasm of colon, unspecified: Secondary | ICD-10-CM

## 2013-07-26 DIAGNOSIS — D128 Benign neoplasm of rectum: Secondary | ICD-10-CM

## 2013-07-26 DIAGNOSIS — Z1211 Encounter for screening for malignant neoplasm of colon: Secondary | ICD-10-CM

## 2013-07-26 DIAGNOSIS — D129 Benign neoplasm of anus and anal canal: Secondary | ICD-10-CM

## 2013-07-26 MED ORDER — SODIUM CHLORIDE 0.9 % IV SOLN: 500.0000 mL | INTRAVENOUS | Status: DC

## 2013-07-26 NOTE — Progress Notes (Signed)
Called to room to assist during endoscopic procedure.  Patient ID and intended procedure confirmed with present staff. Received instructions for my participation in the procedure from the performing physician.  

## 2013-07-26 NOTE — Progress Notes (Signed)
Procedure ends, to recovery, report given and VSS. 

## 2013-07-26 NOTE — Op Note (Signed)
[facility]  [address] [place], [id number]   COLONOSCOPY PROCEDURE REPORT  PATIENT: Jacqueline, Adams  MR#: [id number] BIRTHDATE: 03/23/1960 , 52  yrs. old GENDER: Female ENDOSCOPIST: Eustace Quail, MD REFERRED TS:[id number] Aguiar, M.D. PROCEDURE DATE:  07/26/2013 PROCEDURE:   Colonoscopy with snare polypectomy x 4 First Screening Colonoscopy - Avg.  risk and is 50 yrs.  old or older Yes.  Prior Negative Screening - Now for repeat screening. N/A  History of Adenoma - Now for follow-up colonoscopy & has been > or = to 3 yrs.  N/A  Polyps Removed Today? Yes. ASA CLASS:   Class II INDICATIONS:average risk screening. MEDICATIONS: MAC sedation, administered by CRNA and propofol (Diprivan) 270mg  IV  DESCRIPTION OF PROCEDURE:   After the risks benefits and alternatives of the procedure were thoroughly explained, informed consent was obtained.  A digital rectal exam revealed no abnormalities of the rectum.   The LB [id number]  endoscope was introduced through the anus and advanced to the cecum, which was identified by both the appendix and ileocecal valve. No adverse events experienced.   The quality of the prep was excellent, using MoviPrep  The instrument was then slowly withdrawn as the colon was fully examined.  COLON FINDINGS: Four polyps ranging between 3-45mm in size were found in the ascending colon (2), sigmoid colon, and rectum.  A polypectomy was performed with a cold snare.  The resection was complete and the polyp tissue was completely retrieved.   The colon was otherwise normal.  There was no diverticulosis, inflammation, other polyps or cancers unless previously stated.  Retroflexed views revealed internal hemorrhoids. The time to cecum=4 minutes 47 seconds.  Withdrawal time=9 minutes 07 seconds.  The scope was withdrawn and the procedure completed. COMPLICATIONS: There were no complications.  ENDOSCOPIC IMPRESSION: 1.   Four polyps were  found in the ascending colon, sigmoid colon, and rectum; polypectomy was performed with a cold snare 2.   The colon was otherwise normal  RECOMMENDATIONS: 1. Repeat colonoscopy in 5 years if polyp adenomatous; otherwise 10 years   eSigned:  Eustace Quail, MD 07/26/2013 11:42 AM   cc: Rolan Lipa, MD and The Patient

## 2013-07-26 NOTE — Patient Instructions (Signed)
YOU HAD AN ENDOSCOPIC PROCEDURE TODAY AT THE [facility]: Refer to the procedure report that was given to you for any specific questions about what was found during the examination.  If the procedure report does not answer your questions, please call your gastroenterologist to clarify.  If you requested that your care partner not be given the details of your procedure findings, then the procedure report has been included in a sealed envelope for you to review at your convenience later.  YOU SHOULD EXPECT: Some feelings of bloating in the abdomen. Passage of more gas than usual.  Walking can help get rid of the air that was put into your GI tract during the procedure and reduce the bloating. If you had a lower endoscopy (such as a colonoscopy or flexible sigmoidoscopy) you may notice spotting of blood in your stool or on the toilet paper. If you underwent a bowel prep for your procedure, then you may not have a normal bowel movement for a few days.  DIET: Your first meal following the procedure should be a light meal and then it is ok to progress to your normal diet.  A half-sandwich or bowl of soup is an example of a good first meal.  Heavy or fried foods are harder to digest and may make you feel nauseous or bloated.  Likewise meals heavy in dairy and vegetables can cause extra gas to form and this can also increase the bloating.  Drink plenty of fluids but you should avoid alcoholic beverages for 24 hours.  ACTIVITY: Your care partner should take you home directly after the procedure.  You should plan to take it easy, moving slowly for the rest of the day.  You can resume normal activity the day after the procedure however you should NOT DRIVE or use heavy machinery for 24 hours (because of the sedation medicines used during the test).    SYMPTOMS TO REPORT IMMEDIATELY: A gastroenterologist can be reached at any hour.  During normal business hours, 8:30 AM to 5:00 PM Monday through Friday,  call [phone].  After hours and on weekends, please call the GI answering service at [phone] who will take a message and have the physician on call contact you.   Following lower endoscopy (colonoscopy or flexible sigmoidoscopy):  Excessive amounts of blood in the stool  Significant tenderness or worsening of abdominal pains  Swelling of the abdomen that is new, acute  Fever of 100F or higher  FOLLOW UP: If any biopsies were taken you will be contacted by phone or by letter within the next 1-3 weeks.  Call your gastroenterologist if you have not heard about the biopsies in 3 weeks.  Our staff will call the home number listed on your records the next business day following your procedure to check on you and address any questions or concerns that you may have at that time regarding the information given to you following your procedure. This is a courtesy call and so if there is no answer at the home number and we have not heard from you through the emergency physician on call, we will assume that you have returned to your regular daily activities without incident.  SIGNATURES/CONFIDENTIALITY: You and/or your care partner have signed paperwork which will be entered into your electronic medical record.  These signatures attest to the fact that that the information above on your After Visit Summary has been reviewed and is understood.  Full responsibility of the confidentiality of this   discharge information lies with you and/or your care-partner.  Polyps-handout given  Repeat colonoscopy will be determined by pathology   

## 2013-07-27 ENCOUNTER — Telehealth: Payer: Self-pay | Admitting: *Deleted

## 2013-07-27 NOTE — Telephone Encounter (Signed)
Left message that we called for f/u 

## 2013-07-28 DIAGNOSIS — G35 Multiple sclerosis: Secondary | ICD-10-CM | POA: Insufficient documentation

## 2013-07-28 DIAGNOSIS — D8989 Other specified disorders involving the immune mechanism, not elsewhere classified: Secondary | ICD-10-CM | POA: Insufficient documentation

## 2013-07-28 DIAGNOSIS — M5030 Other cervical disc degeneration, unspecified cervical region: Secondary | ICD-10-CM | POA: Insufficient documentation

## 2013-07-28 DIAGNOSIS — J329 Chronic sinusitis, unspecified: Secondary | ICD-10-CM | POA: Insufficient documentation

## 2013-07-28 DIAGNOSIS — E559 Vitamin D deficiency, unspecified: Secondary | ICD-10-CM | POA: Insufficient documentation

## 2013-07-28 DIAGNOSIS — N951 Menopausal and female climacteric states: Secondary | ICD-10-CM | POA: Insufficient documentation

## 2013-07-28 DIAGNOSIS — G43909 Migraine, unspecified, not intractable, without status migrainosus: Secondary | ICD-10-CM | POA: Insufficient documentation

## 2013-07-28 DIAGNOSIS — M542 Cervicalgia: Secondary | ICD-10-CM | POA: Insufficient documentation

## 2013-07-28 DIAGNOSIS — G9332 Myalgic encephalomyelitis/chronic fatigue syndrome: Secondary | ICD-10-CM | POA: Insufficient documentation

## 2013-07-28 DIAGNOSIS — R252 Cramp and spasm: Secondary | ICD-10-CM | POA: Insufficient documentation

## 2013-07-28 DIAGNOSIS — R5382 Chronic fatigue, unspecified: Secondary | ICD-10-CM

## 2013-07-28 DIAGNOSIS — R69 Illness, unspecified: Secondary | ICD-10-CM | POA: Insufficient documentation

## 2013-07-28 DIAGNOSIS — M47812 Spondylosis without myelopathy or radiculopathy, cervical region: Secondary | ICD-10-CM | POA: Insufficient documentation

## 2013-08-02 ENCOUNTER — Ambulatory Visit: Payer: BC Managed Care – PPO | Admitting: Oncology

## 2013-08-02 ENCOUNTER — Other Ambulatory Visit: Payer: BC Managed Care – PPO

## 2013-08-03 ENCOUNTER — Encounter: Payer: Self-pay | Admitting: Internal Medicine

## 2013-10-25 ENCOUNTER — Telehealth: Payer: Self-pay | Admitting: Hematology and Oncology

## 2013-10-25 NOTE — Telephone Encounter (Signed)
CLD & SPOKE TO PT IN RE TO APPT-GAVE PT NEW APPT TIME & DATE-PT UNDERSTOOD

## 2013-10-27 ENCOUNTER — Ambulatory Visit: Payer: Self-pay | Admitting: Hematology and Oncology

## 2013-10-27 ENCOUNTER — Other Ambulatory Visit: Payer: BC Managed Care – PPO

## 2013-10-28 ENCOUNTER — Ambulatory Visit: Payer: Self-pay | Admitting: Hematology and Oncology

## 2013-11-02 ENCOUNTER — Telehealth: Payer: Self-pay | Admitting: Hematology and Oncology

## 2013-11-02 NOTE — Telephone Encounter (Signed)
, °

## 2013-11-18 ENCOUNTER — Other Ambulatory Visit: Payer: Self-pay | Admitting: Neurology

## 2013-11-18 DIAGNOSIS — G35 Multiple sclerosis: Secondary | ICD-10-CM

## 2013-11-19 ENCOUNTER — Other Ambulatory Visit: Payer: Self-pay

## 2013-11-19 DIAGNOSIS — Z1501 Genetic susceptibility to malignant neoplasm of breast: Secondary | ICD-10-CM

## 2013-11-19 DIAGNOSIS — Z1509 Genetic susceptibility to other malignant neoplasm: Principal | ICD-10-CM

## 2013-11-22 ENCOUNTER — Ambulatory Visit (HOSPITAL_BASED_OUTPATIENT_CLINIC_OR_DEPARTMENT_OTHER): Payer: BC Managed Care – PPO | Admitting: Hematology and Oncology

## 2013-11-22 ENCOUNTER — Other Ambulatory Visit (HOSPITAL_BASED_OUTPATIENT_CLINIC_OR_DEPARTMENT_OTHER): Payer: BC Managed Care – PPO

## 2013-11-22 VITALS — BP 124/71 | HR 92 | Temp 99.3°F | Resp 19 | Ht 64.0 in | Wt 211.0 lb

## 2013-11-22 DIAGNOSIS — Z1509 Genetic susceptibility to other malignant neoplasm: Principal | ICD-10-CM

## 2013-11-22 DIAGNOSIS — Z1501 Genetic susceptibility to malignant neoplasm of breast: Secondary | ICD-10-CM

## 2013-11-22 LAB — CBC WITH DIFFERENTIAL/PLATELET
BASO%: 0.6 % (ref 0.0–2.0)
Basophils Absolute: 0.1 10*3/uL (ref 0.0–0.1)
EOS%: 1.1 % (ref 0.0–7.0)
Eosinophils Absolute: 0.2 10*3/uL (ref 0.0–0.5)
HCT: 36.5 % (ref 34.8–46.6)
HGB: 11.5 g/dL — ABNORMAL LOW (ref 11.6–15.9)
LYMPH%: 33.7 % (ref 14.0–49.7)
MCH: 23.7 pg — AB (ref 25.1–34.0)
MCHC: 31.5 g/dL (ref 31.5–36.0)
MCV: 75.4 fL — AB (ref 79.5–101.0)
MONO#: 0.8 10*3/uL (ref 0.1–0.9)
MONO%: 6.1 % (ref 0.0–14.0)
NEUT#: 8.2 10*3/uL — ABNORMAL HIGH (ref 1.5–6.5)
NEUT%: 58.5 % (ref 38.4–76.8)
Platelets: 307 10*3/uL (ref 145–400)
RBC: 4.84 10*6/uL (ref 3.70–5.45)
RDW: 16.5 % — AB (ref 11.2–14.5)
WBC: 14 10*3/uL — ABNORMAL HIGH (ref 3.9–10.3)
lymph#: 4.7 10*3/uL — ABNORMAL HIGH (ref 0.9–3.3)

## 2013-11-22 LAB — COMPREHENSIVE METABOLIC PANEL (CC13)
ALBUMIN: 3.3 g/dL — AB (ref 3.5–5.0)
ALT: 13 U/L (ref 0–55)
AST: 15 U/L (ref 5–34)
Alkaline Phosphatase: 91 U/L (ref 40–150)
Anion Gap: 10 mEq/L (ref 3–11)
BILIRUBIN TOTAL: 0.23 mg/dL (ref 0.20–1.20)
BUN: 6.6 mg/dL — ABNORMAL LOW (ref 7.0–26.0)
CO2: 25 mEq/L (ref 22–29)
Calcium: 9 mg/dL (ref 8.4–10.4)
Chloride: 107 mEq/L (ref 98–109)
Creatinine: 0.8 mg/dL (ref 0.6–1.1)
Glucose: 105 mg/dl (ref 70–140)
POTASSIUM: 3.1 meq/L — AB (ref 3.5–5.1)
SODIUM: 143 meq/L (ref 136–145)
Total Protein: 7.4 g/dL (ref 6.4–8.3)

## 2013-11-22 NOTE — Progress Notes (Signed)
Patient Care Team: Rafaela M. Ruben Gottron, MD as PCP - General (Family Medicine) Lennon Alstrom, MD (Neurology) Jerrell Belfast, MD  DIAGNOSIS: BRCA2 mutation  CHIEF COMPLIANT: Followup of high risk breast clinic  INTERVAL HISTORY: Jacqueline Adams is a 53 year old American lady with above-mentioned history of BRCA mutation positivity. She was tested for BRCA mutation when her mother had ovarian and breast cancers. She did discuss different treatment options to decrease her dose of breast cancer. She elected to not take tamoxifen. She has been recommended to undergo salpingo-oophorectomy. She has wants to get that down but hasn't done that at this time. She was diagnosed with multiple sclerosis a year ago and is currently on treatment and appears to be doing much better.  REVIEW OF SYSTEMS:   Constitutional: Denies fevers, chills or abnormal weight loss Eyes: Denies blurriness of vision Ears, nose, mouth, throat, and face: Denies mucositis or sore throat Respiratory: Denies cough, dyspnea or wheezes Cardiovascular: Denies palpitation, chest discomfort or lower extremity swelling Gastrointestinal:  Denies nausea, heartburn or change in bowel habits Skin: Denies abnormal skin rashes Lymphatics: Denies new lymphadenopathy or easy bruising Neurological: Multiple sclerosis symptoms are improving especially the numbness in the right of the body and face Behavioral/Psych: Mood is stable, no new changes  Breast:  denies any pain or lumps or nodules in either breasts All other systems were reviewed with the patient and are negative.  I have reviewed the past medical history, past surgical history, social history and family history with the patient and they are unchanged from previous note.  ALLERGIES:  is allergic to novocain and relafen.  MEDICATIONS:  Current Outpatient Prescriptions  Medication Sig Dispense Refill  . chlorpheniramine-HYDROcodone (TUSSIONEX) 10-8 MG/5ML LQCR Take 5 mLs by mouth  every 6 (six) hours as needed.       . fluconazole (DIFLUCAN) 150 MG tablet       . fluticasone (FLONASE) 50 MCG/ACT nasal spray Place 2 sprays into both nostrils daily.  16 g  2  . HYDROcodone-homatropine (HYCODAN) 5-1.5 MG/5ML syrup Take 5 mLs by mouth 2 (two) times daily.  40 mL  0  . ibuprofen (ADVIL,MOTRIN) 400 MG tablet Take 400 mg by mouth every 8 (eight) hours as needed.        . Multiple Vitamins-Minerals (CENTRUM SILVER ADULT 50+ PO) Take by mouth.      . simvastatin (ZOCOR) 20 MG tablet Take 1 tablet by mouth daily.      . TECFIDERA 240 MG CPDR Take 1 capsule by mouth 2 (two) times daily.       . VENTOLIN HFA 108 (90 BASE) MCG/ACT inhaler Inhale 2 puffs into the lungs every 6 (six) hours as needed.      . Vitamin D, Ergocalciferol, (DRISDOL) 50000 UNITS CAPS Take 50,000 Units by mouth every 7 (seven) days.        No current facility-administered medications for this visit.    PHYSICAL EXAMINATION: ECOG PERFORMANCE STATUS: 0 - Asymptomatic  Filed Vitals:   11/22/13 1525  BP: 124/71  Pulse: 92  Temp: 99.3 F (37.4 C)  Resp: 19   Filed Weights   11/22/13 1525  Weight: 211 lb (95.709 kg)    GENERAL:alert, no distress and comfortable SKIN: skin color, texture, turgor are normal, no rashes or significant lesions EYES: normal, Conjunctiva are pink and non-injected, sclera clear OROPHARYNX:no exudate, no erythema and lips, buccal mucosa, and tongue normal  NECK: supple, thyroid normal size, non-tender, without nodularity LYMPH:  no palpable lymphadenopathy  in the cervical, axillary or inguinal LUNGS: clear to auscultation and percussion with normal breathing effort HEART: regular rate & rhythm and no murmurs and no lower extremity edema ABDOMEN:abdomen soft, non-tender and normal bowel sounds Musculoskeletal:no cyanosis of digits and no clubbing  NEURO: alert & oriented x 3 with fluent speech, no focal motor/sensory deficits   LABORATORY DATA:  I have reviewed the data  as listed   Chemistry      Component Value Date/Time   NA 143 11/22/2013 1514   NA 139 02/06/2011 1532   K 3.1* 11/22/2013 1514   K 3.2* 02/06/2011 1532   CL 105 02/18/2012 1207   CL 102 02/06/2011 1532   CO2 25 11/22/2013 1514   CO2 30 02/06/2011 1532   BUN 6.6* 11/22/2013 1514   BUN 8 02/06/2011 1532   CREATININE 0.8 11/22/2013 1514   CREATININE 0.61 02/06/2011 1532      Component Value Date/Time   CALCIUM 9.0 11/22/2013 1514   CALCIUM 9.5 02/06/2011 1532   ALKPHOS 91 11/22/2013 1514   ALKPHOS 96 02/06/2011 1532   AST 15 11/22/2013 1514   AST 16 02/06/2011 1532   ALT 13 11/22/2013 1514   ALT 17 02/06/2011 1532   BILITOT 0.23 11/22/2013 1514   BILITOT 0.2* 02/06/2011 1532       Lab Results  Component Value Date   WBC 14.0* 11/22/2013   HGB 11.5* 11/22/2013   HCT 36.5 11/22/2013   MCV 75.4* 11/22/2013   PLT 307 11/22/2013   NEUTROABS 8.2* 11/22/2013     RADIOGRAPHIC STUDIES: I have personally reviewed the radiology reports and agreed with their findings. No results found.   ASSESSMENT & PLAN:  BRCA2 positive BRCA2 mutation positive: Patient plans to get oophorectomy done with Dr. Gaynell Face sometime soon. I discussed with her that it is urgent that she needs to get that done since there is no way to do surveillance for ovarian cancer.  Breast cancer surveillance: I've arranged MRI and mammogram for March 2016. We will see her after that for followup and surveillance. Patient decided against getting bilateral mastectomies as well as against taking tamoxifen.  Multiple sclerosis: Patient is currently on treatment and appears to be doing better.   Orders Placed This Encounter  Procedures  . MM Digital Diagnostic Bilat    Standing Status: Future     Number of Occurrences:      Standing Expiration Date: 11/22/2014    Order Specific Question:  Reason for Exam (SYMPTOM  OR DIAGNOSIS REQUIRED)    Answer:  BRCA mutation positive annual follow up    Order Specific  Question:  Is the patient pregnant?    Answer:  No    Order Specific Question:  Preferred imaging location?    Answer:  [facility]  . MR Breast Bilateral W Contrast    Standing Status: Future     Number of Occurrences:      Standing Expiration Date: 11/22/2014    Order Specific Question:  Reason for Exam (SYMPTOM  OR DIAGNOSIS REQUIRED)    Answer:  BRCA mutation positive annual follow up    Order Specific Question:  Preferred imaging location?    Answer:  [facility]    Order Specific Question:  Does the patient have a pacemaker or implanted devices?    Answer:  No    Order Specific Question:  What is the patient's sedation requirement?    Answer:  No Sedation   The patient has a good understanding  of the overall plan. she agrees with it. She will call with any problems that may develop before her next visit here.  I spent 15 minutes counseling the patient face to face. The total time spent in the appointment was 20 minutes and more than 50% was on counseling and review of test results    Rulon Eisenmenger, MD 11/22/2013 4:19 PM

## 2013-11-22 NOTE — Assessment & Plan Note (Signed)
BRCA2 mutation positive: Patient plans to get oophorectomy done with Dr. Ruthann Cancer sometime soon. I discussed with her that it is urgent that she needs to get that done since there is no way to do surveillance for ovarian cancer.  Breast cancer surveillance: I've arranged MRI and mammogram for March 2016. We will see her after that for followup and surveillance. Patient decided against getting bilateral mastectomies as well as against taking tamoxifen.  Multiple sclerosis: Patient is currently on treatment and appears to be doing better.

## 2013-11-23 ENCOUNTER — Telehealth: Payer: Self-pay | Admitting: Hematology and Oncology

## 2013-11-23 ENCOUNTER — Other Ambulatory Visit: Payer: Self-pay | Admitting: Hematology and Oncology

## 2013-11-23 DIAGNOSIS — Z1231 Encounter for screening mammogram for malignant neoplasm of breast: Secondary | ICD-10-CM

## 2013-11-23 DIAGNOSIS — Z1501 Genetic susceptibility to malignant neoplasm of breast: Secondary | ICD-10-CM

## 2013-11-23 DIAGNOSIS — Z1509 Genetic susceptibility to other malignant neoplasm: Principal | ICD-10-CM

## 2013-11-23 NOTE — Telephone Encounter (Signed)
s.w. pt and advised on March and April 2016 appt .Marland KitchenMarland Kitchenpt ok and aware

## 2013-11-24 ENCOUNTER — Other Ambulatory Visit: Payer: Self-pay | Admitting: Neurology

## 2013-11-24 DIAGNOSIS — G35 Multiple sclerosis: Secondary | ICD-10-CM

## 2013-11-27 ENCOUNTER — Other Ambulatory Visit: Payer: BC Managed Care – PPO

## 2013-11-30 ENCOUNTER — Other Ambulatory Visit: Payer: BC Managed Care – PPO

## 2013-12-02 ENCOUNTER — Other Ambulatory Visit: Payer: BC Managed Care – PPO

## 2013-12-07 ENCOUNTER — Ambulatory Visit
Admission: RE | Admit: 2013-12-07 | Discharge: 2013-12-07 | Disposition: A | Discharge status: Home / Self Care | Payer: BC Managed Care – PPO | Source: Ambulatory Visit | Attending: Neurology | Admitting: Neurology

## 2013-12-07 DIAGNOSIS — G35 Multiple sclerosis: Secondary | ICD-10-CM

## 2013-12-07 MED ORDER — GADOBENATE DIMEGLUMINE 529 MG/ML IV SOLN
19.0000 mL | Freq: Once | INTRAVENOUS | Status: AC | PRN
  Administered 2013-12-07: 19 mL via INTRAVENOUS

## 2014-02-14 DIAGNOSIS — E785 Hyperlipidemia, unspecified: Secondary | ICD-10-CM | POA: Insufficient documentation

## 2014-02-22 ENCOUNTER — Other Ambulatory Visit: Payer: Self-pay | Admitting: Dermatology

## 2014-04-27 ENCOUNTER — Other Ambulatory Visit (HOSPITAL_COMMUNITY): Payer: Self-pay | Admitting: Internal Medicine

## 2014-04-27 ENCOUNTER — Ambulatory Visit (INDEPENDENT_AMBULATORY_CARE_PROVIDER_SITE_OTHER): Payer: BLUE CROSS/BLUE SHIELD | Admitting: Internal Medicine

## 2014-04-27 ENCOUNTER — Encounter: Payer: Self-pay | Admitting: Internal Medicine

## 2014-04-27 VITALS — BP 124/74 | HR 87 | Ht 64.0 in | Wt 213.8 lb

## 2014-04-27 DIAGNOSIS — R079 Chest pain, unspecified: Secondary | ICD-10-CM

## 2014-04-27 DIAGNOSIS — G35 Multiple sclerosis: Secondary | ICD-10-CM

## 2014-04-27 DIAGNOSIS — E785 Hyperlipidemia, unspecified: Secondary | ICD-10-CM | POA: Insufficient documentation

## 2014-04-27 DIAGNOSIS — R0609 Other forms of dyspnea: Secondary | ICD-10-CM | POA: Insufficient documentation

## 2014-04-27 DIAGNOSIS — E669 Obesity, unspecified: Secondary | ICD-10-CM

## 2014-04-27 DIAGNOSIS — R0602 Shortness of breath: Secondary | ICD-10-CM

## 2014-04-27 DIAGNOSIS — R0789 Other chest pain: Secondary | ICD-10-CM | POA: Insufficient documentation

## 2014-04-27 NOTE — Progress Notes (Signed)
OFFICE NOTE  Chief Complaint:  DOE, chest pain  Primary Care Physician: Jacqueline Adams., MD  HPI:  Jacqueline Adams is a pleasant 54 year old female who self-referred for evaluation for coronary disease. Her past history significant for multiple sclerosis, diagnosed in 2009 which is a relapsing/remitting type. She's currently not on medication. She also has dyslipidemia. Family history significant for hypertension and diabetes in her mother. She is also obese. She reports little exercise. Recently she's been having some shortness of breath which is noticeably worse when walking up stairs or doing activities. She denies any chest pain or pressure during these activities but notes some discomfort in her chest when laying flat at night. Because of this she's been sleeping a little more upright. She also notes a little more shortness of breath laying down. She does not report true palpitations.   PMHx:  Past Medical History  Diagnosis Date  . Sinusitis   . Bronchitis   . Cervical spondylarthritis   . Rhinitis   . Headache(784.0)   . Multiple sclerosis 2009    followed by Dr. Erling Cruz  . Leukocytosis 2009  . High cholesterol     Past Surgical History  Procedure Laterality Date  . Cesarean section      x 3  . Partial hysterectomy  2006    for uterine fibroid.     FAMHx:  Family History  Problem Relation Age of Onset  . Cancer Mother 52    ovarian cancer; now with suspected breast cancer  . Diabetes Mother   . Clotting disorder Father   . Cancer Maternal Aunt     breast cancer  . Cancer Maternal Grandmother     unknown female reproductive organ cancer  . Cancer Maternal Aunt     breast cancer  . Cancer Maternal Aunt     breast cancer  . Colon cancer Sister 58    anal cancer    SOCHx:   reports that she has never smoked. She has never used smokeless tobacco. She reports that she does not drink alcohol or use illicit drugs.  ALLERGIES:  Allergies  Allergen  Reactions  . Novocain [Procaine] Swelling  . Relafen [Nabumetone] Swelling    ROS: A comprehensive review of systems was negative except for: Respiratory: positive for dyspnea on exertion Cardiovascular: positive for chest pain  HOME MEDS: Current Outpatient Prescriptions  Medication Sig Dispense Refill  . chlorpheniramine-HYDROcodone (TUSSIONEX) 10-8 MG/5ML LQCR Take 5 mLs by mouth every 6 (six) hours as needed.     . doxycycline (VIBRAMYCIN) 100 MG capsule Take 100 mg by mouth 2 (two) times daily. For 7 days    . fluconazole (DIFLUCAN) 150 MG tablet     . fluticasone (FLONASE) 50 MCG/ACT nasal spray Place 2 sprays into both nostrils daily. 16 g 2  . HYDROcodone-homatropine (HYCODAN) 5-1.5 MG/5ML syrup Take 5 mLs by mouth 2 (two) times daily. 40 mL 0  . ibuprofen (ADVIL,MOTRIN) 400 MG tablet Take 400 mg by mouth every 8 (eight) hours as needed.      . methocarbamol (ROBAXIN) 750 MG tablet Take 750 mg by mouth as needed.    . Multiple Vitamins-Minerals (CENTRUM SILVER ADULT 50+ PO) Take by mouth.    . promethazine (PHENERGAN) 25 MG tablet Take 25 mg by mouth every 6 (six) hours as needed.    . simvastatin (ZOCOR) 20 MG tablet Take 1 tablet by mouth daily.    . traMADol (ULTRAM) 50 MG tablet Take 50 mg by mouth  every 6 (six) hours as needed.    . VENTOLIN HFA 108 (90 BASE) MCG/ACT inhaler Inhale 2 puffs into the lungs every 6 (six) hours as needed.    . Vitamin D, Ergocalciferol, (DRISDOL) 50000 UNITS CAPS Take 50,000 Units by mouth every 7 (seven) days.      No current facility-administered medications for this visit.    LABS/IMAGING: No results found for this or any previous visit (from the past 48 hour(s)). No results found.  VITALS: BP 124/74 mmHg  Pulse 87  Ht 5\' 4"  (1.626 m)  Wt 213 lb 12.8 oz (96.979 kg)  BMI 36.68 kg/m2  EXAM: General appearance: alert and no distress Neck: no carotid bruit and no JVD Lungs: clear to auscultation bilaterally Heart: regular rate and  rhythm, S1, S2 normal, no murmur, click, rub or gallop Abdomen: soft, non-tender; bowel sounds normal; no masses,  no organomegaly Extremities: extremities normal, atraumatic, no cyanosis or edema Pulses: 2+ and symmetric Skin: Skin color, texture, turgor normal. No rashes or lesions Neurologic: Grossly normal Psych: Pleasant  EKG: Normal sinus rhythm at 87, no ischemic changes  ASSESSMENT: 1. Atypical chest pain 2. Dyspnea on exertion 3. Obesity 4. Dyslipidemia  PLAN: 1.   [name] has few cardiac risk factors. She is surgically postmenopausal and in the age range with an increased risk for coronary disease. She is describing atypical chest discomfort which is somewhat worse when laying down and improved with sitting up but does not have features consistent with a pericarditis. She also has some shortness of breath mostly walking up stairs. This really could be due to weight and deconditioning however with some associated discomfort in the chest, would recommend some sort of exercise testing. I've recommended a cardio metabolic exercise stress test which hopefully will help the delineate the cause of her shortness of breath as well. Also, she has a desire to know whether there is underlying coronary artery disease. I think she is a good candidate for coronary artery calcium score. If her calcium score is 0, that would be very helpful due to its negative predictive value. Plan to see her back in a few weeks to discuss results of the studies.  Pixie Casino, MD, [facility] Attending Cardiologist [facility]  HILTY,Kenneth C 04/27/2014, 9:58 AM

## 2014-04-27 NOTE — Patient Instructions (Addendum)
Dr.Hilty has ordered for you to have a CT Calcium Score. This will be preformed at our [facility]   Dr Debara Pickett has ordered a cardiometabolic test - this is done @ [place] physician recommends that you schedule a follow-up appointment after your testing.      What is a Cardiopulmonary Exercise Test (CPET)?   The Cardiopulmonary Exercise Test is a highly sensitive, non-invasive stress test. It is considered a stress test because the exercise stresses your body's systems by making them work faster and harder. A disease or condition that affects the heart, lungs or muscles will limit how much faster and harder these systems can work. A CPET assesses how well the heart, lungs, and muscles are working individually, and how these systems are working in unison. Your heart and lungs work together to deliver oxygen to your muscles, where it is used to make energy, and to remove carbon dioxide from your body.  The full cardiopulmonary system is assessed during a CPET by measuring the amount of oxygen your body is using, the amount of carbon dioxide it is producing, your breathing pattern, and electrocardiogram (EKG) while you are riding a stationary bicycle.  The traditional treadmill stress test only relies on the EKG, which only partially assesses the heart and nothing else. Besides detecting problems in multiple body systems, the CPET is also used to monitor changes in your disease condition, the effect of certain medications on your body, and if medical therapy is improving your condition.  What conditions can be detected/monitored by the Cardiopulmonary ExerciseTest?  Heart, lung, and metabolic conditions may cause shortness of breath, exercise intolerance or discomfort and pain in the chest. The CPET is the only test that can simultaneously determine which of these systems is causing the problem  Your physician has recommended that you have a pulmonary function test. Pulmonary  Function Tests are a group of tests that measure how well air moves in and out of your lungs. This is done with the MET Test.

## 2014-04-29 ENCOUNTER — Ambulatory Visit
Admission: RE | Admit: 2014-04-29 | Discharge: 2014-04-29 | Disposition: A | Discharge status: Home / Self Care | Payer: BLUE CROSS/BLUE SHIELD | Source: Ambulatory Visit | Attending: Hematology and Oncology | Admitting: Hematology and Oncology

## 2014-04-29 DIAGNOSIS — Z1509 Genetic susceptibility to other malignant neoplasm: Principal | ICD-10-CM

## 2014-04-29 DIAGNOSIS — Z1231 Encounter for screening mammogram for malignant neoplasm of breast: Secondary | ICD-10-CM

## 2014-04-29 DIAGNOSIS — Z1501 Genetic susceptibility to malignant neoplasm of breast: Secondary | ICD-10-CM

## 2014-05-02 ENCOUNTER — Ambulatory Visit (HOSPITAL_COMMUNITY)
Admission: RE | Admit: 2014-05-02 | Discharge: 2014-05-02 | Disposition: A | Discharge status: Home / Self Care | Payer: BLUE CROSS/BLUE SHIELD | Source: Ambulatory Visit | Attending: Hematology and Oncology | Admitting: Hematology and Oncology

## 2014-05-02 DIAGNOSIS — Z1509 Genetic susceptibility to other malignant neoplasm: Secondary | ICD-10-CM

## 2014-05-02 DIAGNOSIS — Z1501 Genetic susceptibility to malignant neoplasm of breast: Secondary | ICD-10-CM | POA: Diagnosis not present

## 2014-05-02 DIAGNOSIS — Z803 Family history of malignant neoplasm of breast: Secondary | ICD-10-CM | POA: Insufficient documentation

## 2014-05-02 MED ORDER — GADOBENATE DIMEGLUMINE 529 MG/ML IV SOLN
19.0000 mL | Freq: Once | INTRAVENOUS | Status: AC | PRN
  Administered 2014-05-02: 19 mL via INTRAVENOUS

## 2014-05-09 ENCOUNTER — Ambulatory Visit (INDEPENDENT_AMBULATORY_CARE_PROVIDER_SITE_OTHER)
Admission: RE | Admit: 2014-05-09 | Discharge: 2014-05-09 | Disposition: A | Discharge status: Home / Self Care | Payer: BLUE CROSS/BLUE SHIELD | Source: Ambulatory Visit | Attending: Internal Medicine | Admitting: Internal Medicine

## 2014-05-09 DIAGNOSIS — E785 Hyperlipidemia, unspecified: Secondary | ICD-10-CM

## 2014-05-09 DIAGNOSIS — R079 Chest pain, unspecified: Secondary | ICD-10-CM

## 2014-05-09 DIAGNOSIS — E669 Obesity, unspecified: Secondary | ICD-10-CM

## 2014-05-09 DIAGNOSIS — R0602 Shortness of breath: Secondary | ICD-10-CM

## 2014-05-16 ENCOUNTER — Telehealth: Payer: Self-pay | Admitting: Internal Medicine

## 2014-05-16 NOTE — Telephone Encounter (Signed)
Per the answering service from Friday-05-13-14: Returning call from [place] not state who called.

## 2014-05-16 NOTE — Telephone Encounter (Signed)
Patient notified of results of CT calcium score. She voiced understanding

## 2014-05-19 ENCOUNTER — Ambulatory Visit (HOSPITAL_COMMUNITY): Payer: BLUE CROSS/BLUE SHIELD | Attending: Family Medicine

## 2014-05-19 DIAGNOSIS — E785 Hyperlipidemia, unspecified: Secondary | ICD-10-CM | POA: Diagnosis not present

## 2014-05-19 DIAGNOSIS — R079 Chest pain, unspecified: Secondary | ICD-10-CM | POA: Diagnosis not present

## 2014-05-19 DIAGNOSIS — R0602 Shortness of breath: Secondary | ICD-10-CM | POA: Diagnosis not present

## 2014-05-23 ENCOUNTER — Ambulatory Visit (HOSPITAL_BASED_OUTPATIENT_CLINIC_OR_DEPARTMENT_OTHER): Payer: BLUE CROSS/BLUE SHIELD | Admitting: Hematology and Oncology

## 2014-05-23 ENCOUNTER — Telehealth: Payer: Self-pay | Admitting: *Deleted

## 2014-05-23 ENCOUNTER — Telehealth: Payer: Self-pay | Admitting: Hematology and Oncology

## 2014-05-23 DIAGNOSIS — Z1509 Genetic susceptibility to other malignant neoplasm: Principal | ICD-10-CM

## 2014-05-23 DIAGNOSIS — G35 Multiple sclerosis: Secondary | ICD-10-CM | POA: Diagnosis not present

## 2014-05-23 DIAGNOSIS — Z1501 Genetic susceptibility to malignant neoplasm of breast: Secondary | ICD-10-CM | POA: Diagnosis not present

## 2014-05-23 NOTE — Progress Notes (Signed)
Patient Care Team: Robyne Peers, MD as PCP - General (Family Medicine) Lennon Alstrom, MD (Neurology) Jerrell Belfast, MD  DIAGNOSIS:  BRCA2 mutation  current treatment: Observation CHIEF COMPLIANT:  Follow-up of BRCA2 mutation  INTERVAL HISTORY: Jacqueline Adams is a  54 year old with above-mentioned history of BRCA2 mutation who is here for high risk breast clinic follow-up. She reports no major problems or concerns. She has not had any lumps or nodules in the breasts. She has appointment to see Dr. Ruthann Cancer discussed the risks and benefits of oophorectomy.  REVIEW OF SYSTEMS:   Constitutional: Denies fevers, chills or abnormal weight loss Eyes: Denies blurriness of vision Ears, nose, mouth, throat, and face: Denies mucositis or sore throat Respiratory: Denies cough, dyspnea or wheezes Cardiovascular: Denies palpitation, chest discomfort or lower extremity swelling Gastrointestinal:  Denies nausea, heartburn or change in bowel habits Skin: Denies abnormal skin rashes Lymphatics: Denies new lymphadenopathy or easy bruising Neurological:Denies numbness, tingling or new weaknesses Behavioral/Psych: Mood is stable, no new changes  Breast:  denies any pain or lumps or nodules in either breasts All other systems were reviewed with the patient and are negative.  I have reviewed the past medical history, past surgical history, social history and family history with the patient and they are unchanged from previous note.  ALLERGIES:  is allergic to novocain and relafen.  MEDICATIONS:  Current Outpatient Prescriptions  Medication Sig Dispense Refill  . fluticasone (FLONASE) 50 MCG/ACT nasal spray Place 2 sprays into both nostrils daily. 16 g 2  . ibuprofen (ADVIL,MOTRIN) 400 MG tablet Take 400 mg by mouth every 8 (eight) hours as needed.      . simvastatin (ZOCOR) 20 MG tablet Take 1 tablet by mouth daily.    . chlorpheniramine-HYDROcodone (TUSSIONEX) 10-8 MG/5ML LQCR Take 5 mLs by  mouth every 6 (six) hours as needed.     . fluconazole (DIFLUCAN) 150 MG tablet     . HYDROcodone-homatropine (HYCODAN) 5-1.5 MG/5ML syrup Take 5 mLs by mouth 2 (two) times daily. (Patient not taking: Reported on 05/23/2014) 40 mL 0  . methocarbamol (ROBAXIN) 750 MG tablet Take 750 mg by mouth as needed.    . Multiple Vitamins-Minerals (CENTRUM SILVER ADULT 50+ PO) Take by mouth.    . promethazine (PHENERGAN) 25 MG tablet Take 25 mg by mouth every 6 (six) hours as needed.    . traMADol (ULTRAM) 50 MG tablet Take 50 mg by mouth every 6 (six) hours as needed.    . VENTOLIN HFA 108 (90 BASE) MCG/ACT inhaler Inhale 2 puffs into the lungs every 6 (six) hours as needed.     No current facility-administered medications for this visit.    PHYSICAL EXAMINATION: ECOG PERFORMANCE STATUS: 0 - Asymptomatic  Filed Vitals:   05/23/14 1119  BP: 144/88  Pulse: 71  Temp: 98.7 F (37.1 C)  Resp: 18   Filed Weights   05/23/14 1119  Weight: 212 lb 8 oz (96.389 kg)    GENERAL:alert, no distress and comfortable SKIN: skin color, texture, turgor are normal, no rashes or significant lesions EYES: normal, Conjunctiva are pink and non-injected, sclera clear OROPHARYNX:no exudate, no erythema and lips, buccal mucosa, and tongue normal  NECK: supple, thyroid normal size, non-tender, without nodularity LYMPH:  no palpable lymphadenopathy in the cervical, axillary or inguinal LUNGS: clear to auscultation and percussion with normal breathing effort HEART: regular rate & rhythm and no murmurs and no lower extremity edema ABDOMEN:abdomen soft, non-tender and normal bowel sounds Musculoskeletal:no cyanosis of  digits and no clubbing  NEURO: alert & oriented x 3 with fluent speech, no focal motor/sensory deficits BREAST: No palpable masses or nodules in either right or left breasts. No palpable axillary supraclavicular or infraclavicular adenopathy no breast tenderness or nipple discharge. (exam performed in the  presence of a chaperone)  LABORATORY DATA:  I have reviewed the data as listed   Chemistry      Component Value Date/Time   NA 143 11/22/2013 1514   NA 139 02/06/2011 1532   K 3.1* 11/22/2013 1514   K 3.2* 02/06/2011 1532   CL 105 02/18/2012 1207   CL 102 02/06/2011 1532   CO2 25 11/22/2013 1514   CO2 30 02/06/2011 1532   BUN 6.6* 11/22/2013 1514   BUN 8 02/06/2011 1532   CREATININE 0.8 11/22/2013 1514   CREATININE 0.61 02/06/2011 1532      Component Value Date/Time   CALCIUM 9.0 11/22/2013 1514   CALCIUM 9.5 02/06/2011 1532   ALKPHOS 91 11/22/2013 1514   ALKPHOS 96 02/06/2011 1532   AST 15 11/22/2013 1514   AST 16 02/06/2011 1532   ALT 13 11/22/2013 1514   ALT 17 02/06/2011 1532   BILITOT 0.23 11/22/2013 1514   BILITOT 0.2* 02/06/2011 1532       Lab Results  Component Value Date   WBC 14.0* 11/22/2013   HGB 11.5* 11/22/2013   HCT 36.5 11/22/2013   MCV 75.4* 11/22/2013   PLT 307 11/22/2013   NEUTROABS 8.2* 11/22/2013     RADIOGRAPHIC STUDIES: I have personally reviewed the radiology reports and agreed with their findings.  mammogram  And breast MRI March 2016 a normal  ASSESSMENT & PLAN:  BRCA2 mutation positive: Patient plans to get oophorectomy done with Dr. Ruthann Cancer   Breast cancer surveillance:  1.  Mammogram and breast MRI done in March 2016 were normal 2.  Breasts examination 05/23/2014 is normal  Multiple sclerosis: Patient is currently on treatment and appears to be doing better.   Return to clinic in one year for follow-up  Orders Placed This Encounter  Procedures  . MR Breast Bilateral Wo Contrast    Standing Status: Future     Number of Occurrences:      Standing Expiration Date: 05/23/2015    Order Specific Question:  Reason for Exam (SYMPTOM  OR DIAGNOSIS REQUIRED)    Answer:  BRCA 2 mutation    Order Specific Question:  Preferred imaging location?    Answer:  [facility]    Order Specific Question:  Does the patient have a  pacemaker or implanted devices?    Answer:  No    Order Specific Question:  What is the patient's sedation requirement?    Answer:  No Sedation  . MR Breast Bilateral W Contrast    Standing Status: Future     Number of Occurrences:      Standing Expiration Date: 05/23/2015    Order Specific Question:  Reason for Exam (SYMPTOM  OR DIAGNOSIS REQUIRED)    Answer:  BRCA 2 mutation    Order Specific Question:  Preferred imaging location?    Answer:  [facility]    Order Specific Question:  Does the patient have a pacemaker or implanted devices?    Answer:  No    Order Specific Question:  What is the patient's sedation requirement?    Answer:  No Sedation   The patient has a good understanding of the overall plan. she agrees with it. She will  call with any problems that may develop before her next visit here.   Rulon Eisenmenger, MD

## 2014-05-23 NOTE — Telephone Encounter (Signed)
per pof to sch pt appt-gave pt copy of sch-pt to call & sch MRI @ [facility] for screening

## 2014-05-23 NOTE — Telephone Encounter (Signed)
Pt. Informed about her test and the need to call for a follow up appt with Dr. Debara Pickett

## 2014-05-25 ENCOUNTER — Other Ambulatory Visit (HOSPITAL_COMMUNITY): Payer: Self-pay | Admitting: Obstetrics

## 2014-05-25 DIAGNOSIS — Z8041 Family history of malignant neoplasm of ovary: Secondary | ICD-10-CM

## 2014-05-31 ENCOUNTER — Ambulatory Visit (HOSPITAL_COMMUNITY): Admission: RE | Admit: 2014-05-31 | Payer: BLUE CROSS/BLUE SHIELD | Source: Ambulatory Visit

## 2014-06-03 ENCOUNTER — Ambulatory Visit (HOSPITAL_COMMUNITY): Payer: BLUE CROSS/BLUE SHIELD

## 2014-06-07 DIAGNOSIS — Z0000 Encounter for general adult medical examination without abnormal findings: Secondary | ICD-10-CM | POA: Insufficient documentation

## 2014-06-08 ENCOUNTER — Ambulatory Visit (HOSPITAL_COMMUNITY)
Admission: RE | Admit: 2014-06-08 | Discharge: 2014-06-08 | Disposition: A | Discharge status: Home / Self Care | Payer: BLUE CROSS/BLUE SHIELD | Source: Ambulatory Visit | Attending: Obstetrics | Admitting: Obstetrics

## 2014-06-08 DIAGNOSIS — N8320 Unspecified ovarian cysts: Secondary | ICD-10-CM | POA: Insufficient documentation

## 2014-06-08 DIAGNOSIS — Z90710 Acquired absence of both cervix and uterus: Secondary | ICD-10-CM | POA: Insufficient documentation

## 2014-06-08 DIAGNOSIS — Z8041 Family history of malignant neoplasm of ovary: Secondary | ICD-10-CM | POA: Diagnosis not present

## 2014-06-23 ENCOUNTER — Ambulatory Visit: Payer: BLUE CROSS/BLUE SHIELD | Admitting: Internal Medicine

## 2014-08-10 ENCOUNTER — Encounter: Payer: Self-pay | Admitting: Internal Medicine

## 2014-08-10 ENCOUNTER — Ambulatory Visit (INDEPENDENT_AMBULATORY_CARE_PROVIDER_SITE_OTHER): Payer: BLUE CROSS/BLUE SHIELD | Admitting: Internal Medicine

## 2014-08-10 VITALS — BP 120/70 | HR 71 | Ht 64.0 in | Wt 209.0 lb

## 2014-08-10 DIAGNOSIS — G35 Multiple sclerosis: Secondary | ICD-10-CM

## 2014-08-10 DIAGNOSIS — R0609 Other forms of dyspnea: Secondary | ICD-10-CM

## 2014-08-10 DIAGNOSIS — R0789 Other chest pain: Secondary | ICD-10-CM | POA: Diagnosis not present

## 2014-08-10 NOTE — Patient Instructions (Signed)
Your physician recommends that you schedule a follow-up appointment as needed  

## 2014-08-10 NOTE — Progress Notes (Signed)
OFFICE NOTE  Chief Complaint:  DOE, chest pain, follow-up stress test  Primary Care Physician: Robyne Peers., MD  HPI:  Jacqueline Adams is a pleasant 54 year old female who self-referred for evaluation for coronary disease. Her past history significant for multiple sclerosis, diagnosed in 2009 which is a relapsing/remitting type. She's currently not on medication. She also has dyslipidemia. Family history significant for hypertension and diabetes in her mother. She is also obese. She reports little exercise. Recently she's been having some shortness of breath which is noticeably worse when walking up stairs or doing activities. She denies any chest pain or pressure during these activities but notes some discomfort in her chest when laying flat at night. Because of this she's been sleeping a little more upright. She also notes a little more shortness of breath laying down. She does not report true palpitations.   Jacqueline Adams returns today for follow-up. She underwent coronary artery calcium scoring which showed a calcium score of 0. There were no extracardiac findings. She then underwent cardiomegaly metabolic exercise testing on a treadmill. She was able to exercise for more than 10 minutes and actually had a favorable exercise response. Pulmonary function testing was normal and her VO2 was at least 18. It was felt that she had some mild deconditioning which led to her shortness of breath and this was likely related to weight. As a hypertensive response to exercise which also suggested cardiovascular deconditioning.  PMHx:  Past Medical History  Diagnosis Date  . Sinusitis   . Bronchitis   . Cervical spondylarthritis   . Rhinitis   . Headache(784.0)   . Multiple sclerosis 2009    followed by Dr. Erling Cruz  . Leukocytosis 2009  . High cholesterol     Past Surgical History  Procedure Laterality Date  . Cesarean section      x 3  . Partial hysterectomy  2006    for uterine  fibroid.     FAMHx:  Family History  Problem Relation Age of Onset  . Cancer Mother 87    ovarian cancer; now with suspected breast cancer  . Diabetes Mother     also HTN  . Clotting disorder Father   . Breast cancer Maternal Aunt   . Cancer Maternal Grandmother     unknown female reproductive organ cancer  . Breast cancer Maternal Aunt   . Breast cancer Maternal Aunt   . Colon cancer Sister 63    anal cancer (2014)  . Lung cancer Sister 89  . Stroke Sister 36  . Ovarian cancer Sister 30  . BRCA 1/2 Sister     SOCHx:   reports that she has never smoked. She has never used smokeless tobacco. She reports that she does not drink alcohol or use illicit drugs.  ALLERGIES:  Allergies  Allergen Reactions  . Novocain [Procaine] Swelling  . Relafen [Nabumetone] Swelling    ROS: A comprehensive review of systems was negative except for: Respiratory: positive for dyspnea on exertion Cardiovascular: positive for chest pain  HOME MEDS: Current Outpatient Prescriptions  Medication Sig Dispense Refill  . chlorpheniramine-HYDROcodone (TUSSIONEX) 10-8 MG/5ML LQCR Take 5 mLs by mouth every 6 (six) hours as needed.     . fluconazole (DIFLUCAN) 150 MG tablet     . fluticasone (FLONASE) 50 MCG/ACT nasal spray Place 2 sprays into both nostrils daily. 16 g 2  . HYDROcodone-homatropine (HYCODAN) 5-1.5 MG/5ML syrup Take 5 mLs by mouth 2 (two) times daily. (Patient not taking: Reported on  05/23/2014) 40 mL 0  . ibuprofen (ADVIL,MOTRIN) 400 MG tablet Take 400 mg by mouth every 8 (eight) hours as needed.      . methocarbamol (ROBAXIN) 750 MG tablet Take 750 mg by mouth as needed.    . Multiple Vitamins-Minerals (CENTRUM SILVER ADULT 50+ PO) Take by mouth.    . promethazine (PHENERGAN) 25 MG tablet Take 25 mg by mouth every 6 (six) hours as needed.    . simvastatin (ZOCOR) 20 MG tablet Take 1 tablet by mouth daily.    . traMADol (ULTRAM) 50 MG tablet Take 50 mg by mouth every 6 (six) hours as  needed.    . VENTOLIN HFA 108 (90 BASE) MCG/ACT inhaler Inhale 2 puffs into the lungs every 6 (six) hours as needed.     No current facility-administered medications for this visit.    LABS/IMAGING: No results found for this or any previous visit (from the past 48 hour(s)). No results found.  VITALS: BP 120/70 mmHg  Pulse 71  Ht 5' 4" (1.626 m)  Wt 209 lb (94.802 kg)  BMI 35.86 kg/m2  EXAM: Deferred  EKG: Deferred  ASSESSMENT: 1. Atypical chest pain - low risk cardiometabolic exercise test 2. Dyspnea on exertion 3. Obesity 4. Dyslipidemia  PLAN: 1.   Jacqueline Adams had a negative coronary artery calcium score of 0. She performed well on cardio metabolic exercise testing without evidence of ischemia. Her VO2 was preserved. It was felt that her shortness of breath may be due to obesity and cardiovascular deconditioning. I do not suspect coronary artery disease as a cause of her symptoms. I recommended increased exercise and weight loss which should be very helpful with her shortness of breath.  Follow-up with me as needed. Thanks again for allowing me to participate in her care.  Pixie Casino, MD, [facility] Attending Cardiologist [facility] 08/10/2014, 6:23 PM

## 2014-11-09 ENCOUNTER — Encounter: Payer: Self-pay | Admitting: Neurology

## 2014-11-09 ENCOUNTER — Ambulatory Visit (INDEPENDENT_AMBULATORY_CARE_PROVIDER_SITE_OTHER): Payer: BLUE CROSS/BLUE SHIELD | Admitting: Neurology

## 2014-11-09 ENCOUNTER — Encounter: Payer: Self-pay | Admitting: *Deleted

## 2014-11-09 VITALS — BP 134/78 | HR 72 | Resp 16 | Ht 64.0 in | Wt 215.8 lb

## 2014-11-09 DIAGNOSIS — E559 Vitamin D deficiency, unspecified: Secondary | ICD-10-CM

## 2014-11-09 DIAGNOSIS — G43009 Migraine without aura, not intractable, without status migrainosus: Secondary | ICD-10-CM | POA: Diagnosis not present

## 2014-11-09 DIAGNOSIS — G35 Multiple sclerosis: Secondary | ICD-10-CM

## 2014-11-09 DIAGNOSIS — R252 Cramp and spasm: Secondary | ICD-10-CM | POA: Diagnosis not present

## 2014-11-09 NOTE — Progress Notes (Signed)
[facility]  PATIENT: Jacqueline Adams DOB: 1961/01/01  REFERRING DOCTOR OR PCP:  Rolan Lipa  SOURCE: patientand records form [facility]  _________________________________   HISTORICAL  CHIEF COMPLAINT:  Chief Complaint  Patient presents with  . Multiple Sclerosis    Former pt. of Dr. Garth Bigness from [facility].  Sts. she was dx. with MS in 2009.  Sts. presenting sx. were right sided facial numbness, shooting pain down right arm, numbness/tingling in right hand, and numbness in her right foot, weaknes in right arm and leg.  Sts. dx. confirmed with MRI and LP.  She initially saw Dr. Erling Cruz here at [facility].  She was first started on Avonex.  Dr. Erling Cruz retired in 2014 and pt. transferred care to Dr. Felecia Shelling.  Sts. she believes mri showed a new lesion, so Dr. Felecia Shelling   . Numbness    switched her to Tecfidera.  She didn't have any progression of sx. on Tecfidera, but sts. she didn't feel it was helping, so she stopped Tecfidera in December 2015.  She is here today to discuss restarting Aubagio./fim    HISTORY OF PRESENT ILLNESS:  [name] is a 54 yo woman with MS who I have previously seen while at [facility].   She switched to Tecfidera 10/2012 but tolerated Avonex better and prefers to switch back.     MS History:  In 2009, she had the onset of right arm and leg tingling and clumsiness. At the time, she was seeing Dr. Earley Favor for migraines and he performed a nerve conduction study only showing mild carpal tunnel syndrome. Symptoms worsened the next day and she was referred to Dr. Erling Cruz. An MRI of the brain, cervical spine and a lumbar puncture was performed. The brain MRI showed a large left frontoparietal periventricular focus and several other foci. CSF was consistent with multiple sclerosis.  MRI of the cervical spine was normal. She was started on Avonex.   She was switched to Tecfidera in late 2014 as she had one new focus on her 2014  MRI. She tolerated Tecfidera day but she felt better when she was on Avonex.  Therefore, she would like to switch back to Avonex.  Gait/strength/sensation:  She continues to have right sided numbness and tingling.   This has been present x years.   She denies problems with her gait or with strength.     Bladder:   She denies any urinary urgency, frequency or hesitancy.   She feels she empties well.   No UTI's  Vision   She denies any MS related visual problems.  She has  Borderline glaucoma but has not needed to start drops.     Fatigue/sleep:   She has some fatigue, worsened by heat.   She tries to avoid outdoors in the summer.    She usually sleeps well.   Prednisone pack was recently started for ankle pain and she has more trouble sleeping   Mood/Cognition:   She denies any depression or anxiety.    She has had some stress with sister dying of lung cancer leading to hypercoagulability and CVA/death.  She denies any cognitive difficulty.   She works as a Garment/textile technologist.     She developed a left trochanteric bursitis while on a long bus trip and needed to use crutches.   Pain increases laying on left.   She saw her PCP and a steroid pack was prescribed and she is mildly better.     She also has  a history of migraine headaches for the past 20 years. Initially, these were more severe. And frequent.  Currently she only averages about 2 a month and takes ibuprofen with benefit.  REVIEW OF SYSTEMS: Constitutional: No fevers, chills, sweats, or change in appetite Eyes: No visual changes, double vision, eye pain Ear, nose and throat: No hearing loss, ear pain, nasal congestion, sore throat Cardiovascular: No chest pain, palpitations Respiratory: No shortness of breath at rest or with exertion.   No wheezes GastrointestinaI: No nausea, vomiting, diarrhea, abdominal pain, fecal incontinence Genitourinary: No dysuria, urinary retention or frequency.  No nocturia. Musculoskeletal: No neck pain, back  pain Integumentary: No rash, pruritus, skin lesions Neurological: as above Psychiatric: No depression at this time.  No anxiety Endocrine: No palpitations, diaphoresis, change in appetite, change in weigh or increased thirst Hematologic/Lymphatic: No anemia, purpura, petechiae. Allergic/Immunologic: No itchy/runny eyes, nasal congestion, recent allergic reactions, rashes  ALLERGIES: Allergies  Allergen Reactions  . Novocain [Procaine] Swelling  . Relafen [Nabumetone] Swelling    HOME MEDICATIONS:  Current outpatient prescriptions:  .  baclofen (LIORESAL) 10 MG tablet, Take 10 mg by mouth., Disp: , Rfl:  .  cyclobenzaprine (FLEXERIL) 5 MG tablet, , Disp: , Rfl:  .  fluconazole (DIFLUCAN) 150 MG tablet, , Disp: , Rfl:  .  fluticasone (FLONASE) 50 MCG/ACT nasal spray, Place 2 sprays into both nostrils daily., Disp: 16 g, Rfl: 2 .  ibuprofen (ADVIL,MOTRIN) 400 MG tablet, Take 400 mg by mouth every 8 (eight) hours as needed.  , Disp: , Rfl:  .  methylPREDNISolone (MEDROL DOSEPAK) 4 MG TBPK tablet, , Disp: , Rfl:  .  Multiple Vitamins-Minerals (CENTRUM SILVER ADULT 50+ PO), Take by mouth., Disp: , Rfl:  .  promethazine (PHENERGAN) 25 MG tablet, Take 25 mg by mouth every 6 (six) hours as needed., Disp: , Rfl:  .  simvastatin (ZOCOR) 20 MG tablet, Take 1 tablet by mouth daily., Disp: , Rfl:  .  VENTOLIN HFA 108 (90 BASE) MCG/ACT inhaler, Inhale 2 puffs into the lungs every 6 (six) hours as needed., Disp: , Rfl:  .  HYDROcodone-homatropine (HYCODAN) 5-1.5 MG/5ML syrup, Take 5 mLs by mouth 2 (two) times daily. (Patient not taking: Reported on 05/23/2014), Disp: 40 mL, Rfl: 0  PAST MEDICAL HISTORY: Past Medical History  Diagnosis Date  . Sinusitis   . Bronchitis   . Cervical spondylarthritis   . Rhinitis   . Headache(784.0)   . Multiple sclerosis 2009    followed by Dr. Erling Cruz  . Leukocytosis 2009  . High cholesterol     PAST SURGICAL HISTORY: Past Surgical History  Procedure  Laterality Date  . Cesarean section      x 3  . Partial hysterectomy  2006    for uterine fibroid.     FAMILY HISTORY: Family History  Problem Relation Age of Onset  . Cancer Mother 57    ovarian cancer; now with suspected breast cancer  . Diabetes Mother     also HTN  . Clotting disorder Father   . Breast cancer Maternal Aunt   . Cancer Maternal Grandmother     unknown female reproductive organ cancer  . Breast cancer Maternal Aunt   . Breast cancer Maternal Aunt   . Colon cancer Sister 4    anal cancer (2014)  . Lung cancer Sister 31  . Stroke Sister 78  . Ovarian cancer Sister 35  . BRCA 1/2 Sister     SOCIAL HISTORY:  Social History  Social History  . Marital Status: Married    Spouse Name: [name]  . Number of Children: 2  . Years of Education: College   Occupational History  . chemical byer [place] History Main Topics  . Smoking status: Never Smoker   . Smokeless tobacco: Never Used  . Alcohol Use: No  . Drug Use: No  . Sexual Activity: Yes    Birth Control/ Protection: None, Surgical   Other Topics Concern  . Not on file   Social History Narrative   Patient lives at home with her spouse.   Caffeine Use: 2 sodas daily     PHYSICAL EXAM  Filed Vitals:   11/09/14 1529  BP: 134/78  Pulse: 72  Resp: 16  Height: $Remove'5\' 4"'jJIPChS$  (1.626 m)  Weight: 215 lb 12.8 oz (97.886 kg)    Body mass index is 37.02 kg/(m^2).   General: The patient is well-developed and well-nourished and in no acute distress  Eyes:  Funduscopic exam shows normal optic discs and retinal vessels.  Neck: The neck is supple, no carotid bruits are noted.  The neck is nontender.  Cardiovascular: The heart has a regular rate and rhythm with a normal S1 and S2. There were no murmurs, gallops or rubs. Lungs are clear to auscultation.  Skin: Extremities are without significant edema.  Musculoskeletal:  Back is nontender.  She is tender over the left trochanteric bursa.       Neurologic Exam  Mental status: The patient is alert and oriented x 3 at the time of the examination. The patient has apparent normal recent and remote memory, with an apparently normal attention span and concentration ability.   Speech is normal.  Cranial nerves: Extraocular movements are full. Pupils are equal, round, and reactive to light and accomodation.  Visual fields are full.  Facial symmetry is present. There is good facial sensation to soft touch bilaterally.Facial strength is normal.  Trapezius and sternocleidomastoid strength is normal. No dysarthria is noted.  The tongue is midline, and the patient has symmetric elevation of the soft palate. No obvious hearing deficits are noted.  Motor:  Muscle bulk is normal.   Tone is normal. Strength is  5 / 5 in all 4 extremities.   Sensory: Sensory testing is intact to pinprick, soft touch and vibration sensation in all 4 extremities.  Coordination: Cerebellar testing reveals good finger-nose-finger and heel-to-shin bilaterally.  Gait and station: Station is normal.   Gait is normal. Tandem gait is normal. Romberg is negative.   Reflexes: Deep tendon reflexes are symmetric and normal bilaterally.   Plantar responses are flexor.    DIAGNOSTIC DATA (LABS, IMAGING, TESTING) - I reviewed patient records, labs, notes, testing and imaging myself where available.  Lab Results  Component Value Date   WBC 14.0* 11/22/2013   HGB 11.5* 11/22/2013   HCT 36.5 11/22/2013   MCV 75.4* 11/22/2013   PLT 307 11/22/2013      Component Value Date/Time   NA 143 11/22/2013 1514   NA 139 02/06/2011 1532   K 3.1* 11/22/2013 1514   K 3.2* 02/06/2011 1532   CL 105 02/18/2012 1207   CL 102 02/06/2011 1532   CO2 25 11/22/2013 1514   CO2 30 02/06/2011 1532   GLUCOSE 105 11/22/2013 1514   GLUCOSE 96 02/18/2012 1207   GLUCOSE 89 02/06/2011 1532   BUN 6.6* 11/22/2013 1514   BUN 8 02/06/2011 1532   CREATININE 0.8 11/22/2013 1514   CREATININE 0.61  02/06/2011  1532   CALCIUM 9.0 11/22/2013 1514   CALCIUM 9.5 02/06/2011 1532   PROT 7.4 11/22/2013 1514   PROT 8.0 02/06/2011 1532   ALBUMIN 3.3* 11/22/2013 1514   ALBUMIN 3.4* 02/06/2011 1532   AST 15 11/22/2013 1514   AST 16 02/06/2011 1532   ALT 13 11/22/2013 1514   ALT 17 02/06/2011 1532   ALKPHOS 91 11/22/2013 1514   ALKPHOS 96 02/06/2011 1532   BILITOT 0.23 11/22/2013 1514   BILITOT 0.2* 02/06/2011 1532       ASSESSMENT AND PLAN  DS (disseminated sclerosis)  Multiple sclerosis - Plan: CBC with Differential/Platelet, Hepatic Function Panel, Vit D  25 hydroxy (rtn osteoporosis monitoring)  Avitaminosis D - Plan: Vit D  25 hydroxy (rtn osteoporosis monitoring)  Migraine without aura and without status migrainosus, not intractable  Spasm  1.   We will switch her back to Avonex from Tecfidera as she tolerated it better.   We will check another MRI next year. If there are changes consistent with progression, we would need to consider a different therapy. 2.  Check CBC, LFT, vitamin D.   Supplement vitamin D as needed. 3.   Remain active and exercises tolerated. 4.   She will return to see me in 6 months or sooner if there are new or worsening neurologic symptoms.   Richard A. Felecia Shelling, MD, PhD 7/71/1657, 9:03 PM Certified in Neurology, Clinical Neurophysiology, Sleep Medicine, Pain Medicine and Neuroimaging  [facility] [address], [place], [place] [address] [phone]

## 2014-11-10 ENCOUNTER — Encounter: Payer: Self-pay | Admitting: *Deleted

## 2014-11-10 LAB — CBC WITH DIFFERENTIAL/PLATELET
Basophils Absolute: 0 10*3/uL (ref 0.0–0.2)
Basos: 0 %
EOS (ABSOLUTE): 0 10*3/uL (ref 0.0–0.4)
EOS: 0 %
HEMATOCRIT: 34.9 % (ref 34.0–46.6)
HEMOGLOBIN: 11.4 g/dL (ref 11.1–15.9)
IMMATURE GRANS (ABS): 0.1 10*3/uL (ref 0.0–0.1)
IMMATURE GRANULOCYTES: 1 %
Lymphocytes Absolute: 3.4 10*3/uL — ABNORMAL HIGH (ref 0.7–3.1)
Lymphs: 15 %
MCH: 24.3 pg — ABNORMAL LOW (ref 26.6–33.0)
MCHC: 32.7 g/dL (ref 31.5–35.7)
MCV: 74 fL — AB (ref 79–97)
Monocytes Absolute: 1 10*3/uL — ABNORMAL HIGH (ref 0.1–0.9)
Monocytes: 4 %
NEUTROS PCT: 80 %
Neutrophils Absolute: 18.4 10*3/uL — ABNORMAL HIGH (ref 1.4–7.0)
Platelets: 349 10*3/uL (ref 150–379)
RBC: 4.69 x10E6/uL (ref 3.77–5.28)
RDW: 16.9 % — ABNORMAL HIGH (ref 12.3–15.4)
WBC: 23 10*3/uL (ref 3.4–10.8)

## 2014-11-10 LAB — HEPATIC FUNCTION PANEL
ALK PHOS: 90 IU/L (ref 39–117)
ALT: 11 IU/L (ref 0–32)
AST: 14 IU/L (ref 0–40)
Albumin: 3.9 g/dL (ref 3.5–5.5)
BILIRUBIN, DIRECT: 0.07 mg/dL (ref 0.00–0.40)
Bilirubin Total: <0.2 mg/dL (ref 0.0–1.2)
Total Protein: 6.9 g/dL (ref 6.0–8.5)

## 2014-11-10 LAB — VITAMIN D 25 HYDROXY (VIT D DEFICIENCY, FRACTURES): Vit D, 25-Hydroxy: 20 ng/mL — ABNORMAL LOW (ref 30.0–100.0)

## 2014-11-14 ENCOUNTER — Telehealth: Payer: Self-pay | Admitting: *Deleted

## 2014-11-14 MED ORDER — VITAMIN D (ERGOCALCIFEROL) 1.25 MG (50000 UNIT) PO CAPS: 50000.0000 [IU] | ORAL_CAPSULE | ORAL | Status: DC

## 2014-11-14 NOTE — Telephone Encounter (Signed)
-----   Message from Britt Bottom, MD sent at 11/13/2014  2:31 PM EDT ----- Vit D is low    50000 U weekly x 12 weeks, then 4000-5000 U OTC daily  WBC's are high --- not clear why (has been high a lot in past but not this high)...   I would like to check CBC with diff again AND also UA/UCx to make sure no UTI

## 2014-11-14 NOTE — Telephone Encounter (Signed)
I have spoken with Jacqueline Adams this afternoon, and per RAS, advised that vit. level was low (20); explained the need for rx. vitamin d 50,000iu weekly for 12 weeks, then otc daily vit. d 4-5,000iu  She verbalized understanding of same, and I [facility] escrib[facility]. [facility].  I have also explained that wbc's were high (23), and that RAS would like to investigate a cause.  He would like to repeat cbc with diff, and also do a u/a and urine cx.  She is agreeable--sts. is seeing her pcp on Wednesday, Dr. Ruben Gottron, and will ask to have these labs done in her office, and for results to be faxed to my attn fax # [phone]

## 2014-11-16 DIAGNOSIS — R350 Frequency of micturition: Secondary | ICD-10-CM | POA: Insufficient documentation

## 2014-11-16 DIAGNOSIS — M25559 Pain in unspecified hip: Secondary | ICD-10-CM | POA: Insufficient documentation

## 2014-11-16 DIAGNOSIS — M5440 Lumbago with sciatica, unspecified side: Secondary | ICD-10-CM | POA: Insufficient documentation

## 2014-11-22 ENCOUNTER — Other Ambulatory Visit: Payer: Self-pay | Admitting: *Deleted

## 2014-11-22 MED ORDER — INTERFERON BETA-1A 30 MCG/0.5ML IM PSKT: 30.0000 ug | PREFILLED_SYRINGE | INTRAMUSCULAR | Status: DC

## 2014-11-22 NOTE — Telephone Encounter (Signed)
Received fax from [employer] stating Avonex rx. should be sent to [facility]--I have escribed rx. to [place] as directed/fim

## 2014-12-09 ENCOUNTER — Telehealth: Payer: Self-pay | Admitting: Diagnostic Neuroimaging

## 2014-12-09 NOTE — Telephone Encounter (Signed)
Angie with [employer] called and is following up on the pa Avonix. Please call [phone] her direct #

## 2014-12-09 NOTE — Telephone Encounter (Signed)
The request has already been sent to ins, still pending at this time.  Ref # [id number] - [id number] I called back.  Spoke with Angie.  She is aware.

## 2014-12-11 NOTE — Telephone Encounter (Signed)
[facility] has approved the request for coverage on Avonex effective until 12/07/2016, or until the policy changes or is terminated Ref Member ID: [id number] Ref PA# [employer] [id number]  [facility]

## 2015-01-09 ENCOUNTER — Encounter: Payer: Self-pay | Admitting: *Deleted

## 2015-02-09 ENCOUNTER — Other Ambulatory Visit: Payer: Self-pay | Admitting: Neurology

## 2015-04-13 ENCOUNTER — Telehealth: Payer: Self-pay

## 2015-04-13 NOTE — Telephone Encounter (Signed)
LMOVM - requesting pt to call clinic re: MRI and mammogram.  Next OV 05/30/15.  GI has tried to call her to schedule.

## 2015-04-14 ENCOUNTER — Other Ambulatory Visit: Payer: Self-pay | Admitting: *Deleted

## 2015-04-14 DIAGNOSIS — Z1501 Genetic susceptibility to malignant neoplasm of breast: Secondary | ICD-10-CM

## 2015-04-14 DIAGNOSIS — Z1509 Genetic susceptibility to other malignant neoplasm: Principal | ICD-10-CM

## 2015-04-20 ENCOUNTER — Telehealth: Payer: Self-pay

## 2015-04-20 NOTE — Telephone Encounter (Signed)
Charting error.

## 2015-04-21 ENCOUNTER — Other Ambulatory Visit: Payer: Self-pay

## 2015-04-21 DIAGNOSIS — Z1231 Encounter for screening mammogram for malignant neoplasm of breast: Secondary | ICD-10-CM

## 2015-05-08 ENCOUNTER — Other Ambulatory Visit: Payer: Self-pay | Admitting: Family Medicine

## 2015-05-08 ENCOUNTER — Ambulatory Visit
Admission: RE | Admit: 2015-05-08 | Discharge: 2015-05-08 | Disposition: A | Discharge status: Home / Self Care | Payer: BLUE CROSS/BLUE SHIELD | Source: Ambulatory Visit | Attending: Family Medicine | Admitting: Family Medicine

## 2015-05-08 ENCOUNTER — Ambulatory Visit
Admission: RE | Admit: 2015-05-08 | Discharge: 2015-05-08 | Disposition: A | Discharge status: Home / Self Care | Payer: BLUE CROSS/BLUE SHIELD | Source: Ambulatory Visit

## 2015-05-08 ENCOUNTER — Other Ambulatory Visit: Payer: BLUE CROSS/BLUE SHIELD

## 2015-05-08 ENCOUNTER — Other Ambulatory Visit (HOSPITAL_COMMUNITY): Payer: Self-pay | Admitting: Family Medicine

## 2015-05-08 DIAGNOSIS — R51 Headache: Principal | ICD-10-CM

## 2015-05-08 DIAGNOSIS — R05 Cough: Secondary | ICD-10-CM

## 2015-05-08 DIAGNOSIS — R519 Headache, unspecified: Secondary | ICD-10-CM

## 2015-05-08 DIAGNOSIS — R509 Fever, unspecified: Secondary | ICD-10-CM

## 2015-05-08 DIAGNOSIS — R059 Cough, unspecified: Secondary | ICD-10-CM

## 2015-05-08 DIAGNOSIS — Z1231 Encounter for screening mammogram for malignant neoplasm of breast: Secondary | ICD-10-CM

## 2015-05-09 ENCOUNTER — Encounter: Payer: Self-pay | Admitting: Neurology

## 2015-05-09 ENCOUNTER — Ambulatory Visit (INDEPENDENT_AMBULATORY_CARE_PROVIDER_SITE_OTHER): Payer: BLUE CROSS/BLUE SHIELD | Admitting: Neurology

## 2015-05-09 VITALS — BP 124/68 | HR 78 | Resp 18 | Ht 64.0 in | Wt 219.5 lb

## 2015-05-09 DIAGNOSIS — G35 Multiple sclerosis: Secondary | ICD-10-CM | POA: Diagnosis not present

## 2015-05-09 DIAGNOSIS — R200 Anesthesia of skin: Secondary | ICD-10-CM | POA: Insufficient documentation

## 2015-05-09 DIAGNOSIS — R5383 Other fatigue: Secondary | ICD-10-CM | POA: Diagnosis not present

## 2015-05-09 DIAGNOSIS — M5030 Other cervical disc degeneration, unspecified cervical region: Secondary | ICD-10-CM | POA: Diagnosis not present

## 2015-05-09 DIAGNOSIS — G43009 Migraine without aura, not intractable, without status migrainosus: Secondary | ICD-10-CM

## 2015-05-09 NOTE — Progress Notes (Signed)
[facility]  PATIENT: Jacqueline Adams DOB: 1960-02-12  REFERRING DOCTOR OR PCP:  Rolan Lipa  SOURCE: patientand records form [facility]  _________________________________   HISTORICAL  CHIEF COMPLAINT:  Chief Complaint  Patient presents with  . Multiple Sclerosis    Sts. she continues to tolerate Avonex well.  Sts. has been traveling more than usual, so fatigue is some worse.  She is currently taking Amoxicillin for bronchitis/sinusitis./fim    HISTORY OF PRESENT ILLNESS:  Jacqueline Adams is a 55 yo woman with MS diagnosed in 2009.   She Switch back to Avonex from Tecfidera due to side effects. Her last MRIs from 12/07/2013. There were no enhancing lesions at that time she has no spinal plaques on MRI.  Gait/strength/sensation:  She denies problems with her gait or with strength.    She denies spasticity.  She continues to have right > left sided numbness and tingling, mostly in her feet.   This has been present x years.        Bladder/bowel:    She denies any urinary urgency, frequency or hesitancy.  Has 1 x nocturia, occasionally x 2.   She feels she empties well.   No UTI's    Npo bowel issues  Vision   She denies any MS related visual problems.  She has slightly elevate IOP but not bad enough to be called glaucoma and she is followed q 6 months for tonometry.     Fatigue/sleep:   She has fatigue, worsened by heat and when she travels more.  Also has bronchitis (pneumonia r/o with CT)  And is on Abx.   She has felt more tired x 2 weeks,    She usually sleeps well.   Prednisone pack was recently started for ankle pain and she has more trouble sleeping   Mood/Cognition:   She denies any depression or anxiety.    Last year was tougher with sister's death. She works as a Banker for [employer].    She denies any cognitive difficulty.   She works as a Garment/textile technologist.     Pain:    She has cervical disc degeneration and does some PT with benefit.     Her hip biursitis resolved.     Migraines:   She has a history of migraine headaches for the past 20 years. Initially, these were more severe. And frequent.  Currently she only averages about 1 a month and takes ibuprofen with benefit.  MS History:  In 2009, she had the onset of right arm and leg tingling and clumsiness. At the time, she was seeing Dr. Earley Favor for migraines and he performed a nerve conduction study only showing mild carpal tunnel syndrome. Symptoms worsened the next day and she was referred to Dr. Erling Cruz. An MRI of the brain, cervical spine and a lumbar puncture was performed. The brain MRI showed a large left frontoparietal periventricular focus and several other foci. CSF was consistent with multiple sclerosis.  MRI of the cervical spine was normal. She was started on Avonex.   She was switched to Tecfidera in late 2014 as she had one new focus on her 2014 MRI. She switched back to Avonex in 2016.[name]   REVIEW OF SYSTEMS: Constitutional: No fevers, chills, sweats, or change in appetite.   Some fatigue Eyes: No visual changes, double vision, eye pain.   Being followed for borderline high intraocular pressure. Ear, nose and throat: No hearing loss, ear pain/.    Respiratory: No shortness of  breath at rest or with exertion.   No wheezes.   Currently being treated for bronchitis. Cardiovascular: No chest pain, palpitations GastrointestinaI: No nausea, vomiting, diarrhea, abdominal pain, fecal incontinence Genitourinary: No dysuria, urinary retention or frequency.  No nocturia. Musculoskeletal: No neck pain, back pain Integumentary: No rash, pruritus, skin lesions Neurological: as above Psychiatric: No depression at this time.  No anxiety Endocrine: No palpitations, diaphoresis, change in appetite, change in weigh or increased thirst Hematologic/Lymphatic: No anemia, purpura, petechiae. Allergic/Immunologic: No itchy/runny eyes, nasal congestion, recent allergic reactions,  rashes  ALLERGIES: Allergies  Allergen Reactions  . Novocain [Procaine] Swelling  . Relafen [Nabumetone] Swelling    HOME MEDICATIONS:  Current outpatient prescriptions:  .  amoxicillin-clavulanate (AUGMENTIN) 875-125 MG tablet, , Disp: , Rfl:  .  fluconazole (DIFLUCAN) 150 MG tablet, , Disp: , Rfl:  .  fluticasone (FLONASE) 50 MCG/ACT nasal spray, Place 2 sprays into both nostrils daily., Disp: 16 g, Rfl: 2 .  ibuprofen (ADVIL,MOTRIN) 400 MG tablet, Take 400 mg by mouth every 8 (eight) hours as needed.  , Disp: , Rfl:  .  interferon beta-1a (AVONEX PREFILLED) 30 MCG/0.5ML PSKT injection, Inject 0.5 mLs (30 mcg total) into the muscle every 7 (seven) days., Disp: 12 each, Rfl: 3 .  Multiple Vitamins-Minerals (CENTRUM SILVER ADULT 50+ PO), Take by mouth., Disp: , Rfl:  .  simvastatin (ZOCOR) 20 MG tablet, Take 1 tablet by mouth daily., Disp: , Rfl:  .  VENTOLIN HFA 108 (90 BASE) MCG/ACT inhaler, Inhale 2 puffs into the lungs every 6 (six) hours as needed., Disp: , Rfl:  .  Vitamin D, Ergocalciferol, (DRISDOL) 50000 units CAPS capsule, TAKE 1 CAPSULE (50,000 UNITS TOTAL) BY MOUTH EVERY 7 (SEVEN) DAYS., Disp: 12 capsule, Rfl: 0 .  baclofen (LIORESAL) 10 MG tablet, Take 10 mg by mouth. Reported on 05/09/2015, Disp: , Rfl:  .  cyclobenzaprine (FLEXERIL) 5 MG tablet, Reported on 05/09/2015, Disp: , Rfl:  .  HYDROcodone-homatropine (HYCODAN) 5-1.5 MG/5ML syrup, Take 5 mLs by mouth 2 (two) times daily. (Patient not taking: Reported on 05/23/2014), Disp: 40 mL, Rfl: 0 .  methylPREDNISolone (MEDROL DOSEPAK) 4 MG TBPK tablet, Reported on 05/09/2015, Disp: , Rfl:  .  promethazine (PHENERGAN) 25 MG tablet, Take 25 mg by mouth every 6 (six) hours as needed. Reported on 05/09/2015, Disp: , Rfl:   PAST MEDICAL HISTORY: Past Medical History  Diagnosis Date  . Sinusitis   . Bronchitis   . Cervical spondylarthritis   . Rhinitis   . Headache(784.0)   . Multiple sclerosis ([place]) 2009    followed by Dr. Erling Cruz   . Leukocytosis 2009  . High cholesterol     PAST SURGICAL HISTORY: Past Surgical History  Procedure Laterality Date  . Cesarean section      x 3  . Partial hysterectomy  2006    for uterine fibroid.     FAMILY HISTORY: Family History  Problem Relation Age of Onset  . Cancer Mother 26    ovarian cancer; now with suspected breast cancer  . Diabetes Mother     also HTN  . Clotting disorder Father   . Breast cancer Maternal Aunt   . Cancer Maternal Grandmother     unknown female reproductive organ cancer  . Breast cancer Maternal Aunt   . Breast cancer Maternal Aunt   . Colon cancer Sister 62    anal cancer (2014)  . Lung cancer Sister 74  . Stroke Sister 60  . Ovarian cancer Sister  28  . BRCA 1/2 Sister     SOCIAL HISTORY:  Social History   Social History  . Marital Status: Married    Spouse Name: [name]  . Number of Children: 2  . Years of Education: College   Occupational History  . chemical byer [place] History Main Topics  . Smoking status: Never Smoker   . Smokeless tobacco: Never Used  . Alcohol Use: No  . Drug Use: No  . Sexual Activity: Yes    Birth Control/ Protection: None, Surgical   Other Topics Concern  . Not on file   Social History Narrative   Patient lives at home with her spouse.   Caffeine Use: 2 sodas daily     PHYSICAL EXAM  Filed Vitals:   05/09/15 0906  BP: 124/68  Pulse: 78  Resp: 18  Height: 5' 4"  (1.626 m)  Weight: 219 lb 8 oz (99.565 kg)    Body mass index is 37.66 kg/(m^2).   General: The patient is well-developed and well-nourished and in no acute distress  Eyes:  Funduscopic exam shows normal optic discs and retinal vessels.  Neck: The neck is supple, no carotid bruits are noted.  The neck is nontender.  Cardiovascular: The heart has a regular rate and rhythm with a normal S1 and S2. There were no murmurs, gallops or rubs. Lungs are clear to auscultation.  Skin: Extremities are without  significant edema.  Musculoskeletal:  Back is nontender.  She is tender over the left trochanteric bursa.      Neurologic Exam  Mental status: The patient is alert and oriented x 3 at the time of the examination. The patient has apparent normal recent and remote memory, with an apparently normal attention span and concentration ability.   Speech is normal.  Cranial nerves: Extraocular movements are full. Pupils are equal, round, and reactive to light and accomodation.  Visual fields are full.  Facial symmetry is present. There is good facial sensation to soft touch bilaterally.Facial strength is normal.  Trapezius and sternocleidomastoid strength is normal. No dysarthria is noted.  The tongue is midline, and the patient has symmetric elevation of the soft palate. No obvious hearing deficits are noted.  Motor:  Muscle bulk is normal.   Tone is normal. Strength is  5 / 5 in all 4 extremities.   Sensory: Sensory testing is intact to pinprick, soft touch and vibration sensation in all 4 extremities.  Coordination: Cerebellar testing reveals good finger-nose-finger and heel-to-shin bilaterally.  Gait and station: Station is normal.   Gait is normal. Tandem gait is normal. Romberg is negative.   Reflexes: Deep tendon reflexes are symmetric and normal bilaterally.   Plantar responses are flexor.    DIAGNOSTIC DATA (LABS, IMAGING, TESTING) - I reviewed patient records, labs, notes, testing and imaging myself where available.  Lab Results  Component Value Date   WBC 23.0* 11/09/2014   HGB 11.5* 11/22/2013   HCT 34.9 11/09/2014   MCV 74* 11/09/2014   PLT 349 11/09/2014      Component Value Date/Time   NA 143 11/22/2013 1514   NA 139 02/06/2011 1532   K 3.1* 11/22/2013 1514   K 3.2* 02/06/2011 1532   CL 105 02/18/2012 1207   CL 102 02/06/2011 1532   CO2 25 11/22/2013 1514   CO2 30 02/06/2011 1532   GLUCOSE 105 11/22/2013 1514   GLUCOSE 96 02/18/2012 1207   GLUCOSE 89 02/06/2011 1532    BUN 6.6* 11/22/2013 1514  BUN 8 02/06/2011 1532   CREATININE 0.8 11/22/2013 1514   CREATININE 0.61 02/06/2011 1532   CALCIUM 9.0 11/22/2013 1514   CALCIUM 9.5 02/06/2011 1532   PROT 6.9 11/09/2014 1608   PROT 7.4 11/22/2013 1514   PROT 8.0 02/06/2011 1532   ALBUMIN 3.9 11/09/2014 1608   ALBUMIN 3.3* 11/22/2013 1514   ALBUMIN 3.4* 02/06/2011 1532   AST 14 11/09/2014 1608   AST 15 11/22/2013 1514   ALT 11 11/09/2014 1608   ALT 13 11/22/2013 1514   ALKPHOS 90 11/09/2014 1608   ALKPHOS 91 11/22/2013 1514   BILITOT <0.2 11/09/2014 1608   BILITOT 0.23 11/22/2013 1514   BILITOT 0.2* 02/06/2011 1532       ASSESSMENT AND PLAN  Multiple sclerosis (HCC) - Plan: MR Brain W Wo Contrast, CBC with Differential/Platelet, Comprehensive metabolic panel  Numbness - Plan: MR Brain W Wo Contrast  Degeneration of intervertebral disc of cervical region  Migraine without aura and without status migrainosus, not intractable  Other fatigue    1.   She will continue Avonex..   We will check a brain MRI. If there are changes consistent with subclinical progression, we would need to consider a different therapy. 2.  Check CBC, CMP. 3.   Remain active and exercise as tolerated. 4.   She will return to see me in 6 months or sooner if there are new or worsening neurologic symptoms.   Toria Monte A. Felecia Shelling, MD, PhD 1/00/7121, 9:75 AM Certified in Neurology, Clinical Neurophysiology, Sleep Medicine, Pain Medicine and Neuroimaging  [facility] [address], [place], [place] [address] [phone]

## 2015-05-10 ENCOUNTER — Telehealth: Payer: Self-pay | Admitting: *Deleted

## 2015-05-10 LAB — COMPREHENSIVE METABOLIC PANEL
ALT: 18 IU/L (ref 0–32)
AST: 21 IU/L (ref 0–40)
Albumin/Globulin Ratio: 1.3 (ref 1.2–2.2)
Albumin: 4 g/dL (ref 3.5–5.5)
Alkaline Phosphatase: 88 IU/L (ref 39–117)
BUN/Creatinine Ratio: 9 (ref 9–23)
BUN: 6 mg/dL (ref 6–24)
Bilirubin Total: 0.2 mg/dL (ref 0.0–1.2)
CALCIUM: 9.4 mg/dL (ref 8.7–10.2)
CO2: 22 mmol/L (ref 18–29)
CREATININE: 0.67 mg/dL (ref 0.57–1.00)
Chloride: 103 mmol/L (ref 96–106)
GFR, EST AFRICAN AMERICAN: 117 mL/min/{1.73_m2} (ref >59)
GFR, EST NON AFRICAN AMERICAN: 101 mL/min/{1.73_m2} (ref >59)
Globulin, Total: 3 g/dL (ref 1.5–4.5)
Glucose: 105 mg/dL — ABNORMAL HIGH (ref 65–99)
Potassium: 4.1 mmol/L (ref 3.5–5.2)
SODIUM: 143 mmol/L (ref 134–144)
Total Protein: 7 g/dL (ref 6.0–8.5)

## 2015-05-10 LAB — CBC WITH DIFFERENTIAL/PLATELET
BASOS: 0 %
Basophils Absolute: 0 10*3/uL (ref 0.0–0.2)
EOS (ABSOLUTE): 0.3 10*3/uL (ref 0.0–0.4)
EOS: 3 %
HEMATOCRIT: 37.6 % (ref 34.0–46.6)
Hemoglobin: 12.2 g/dL (ref 11.1–15.9)
IMMATURE GRANULOCYTES: 0 %
Immature Grans (Abs): 0 10*3/uL (ref 0.0–0.1)
Lymphocytes Absolute: 3.9 10*3/uL — ABNORMAL HIGH (ref 0.7–3.1)
Lymphs: 43 %
MCH: 24.6 pg — ABNORMAL LOW (ref 26.6–33.0)
MCHC: 32.4 g/dL (ref 31.5–35.7)
MCV: 76 fL — ABNORMAL LOW (ref 79–97)
MONOCYTES: 8 %
Monocytes Absolute: 0.7 10*3/uL (ref 0.1–0.9)
NEUTROS PCT: 46 %
Neutrophils Absolute: 4.2 10*3/uL (ref 1.4–7.0)
Platelets: 306 10*3/uL (ref 150–379)
RBC: 4.96 x10E6/uL (ref 3.77–5.28)
RDW: 16.6 % — ABNORMAL HIGH (ref 12.3–15.4)
WBC: 9.1 10*3/uL (ref 3.4–10.8)

## 2015-05-10 NOTE — Telephone Encounter (Signed)
-----   Message from Britt Bottom, MD sent at 05/10/2015  8:23 AM EDT ----- Please let her know that the lab work looks okay.

## 2015-05-10 NOTE — Telephone Encounter (Signed)
I have spoken with Jacqueline Adams this afternoon and per RAS, advised that labwork done in our office was ok.  She verbalized understanding of same./fim

## 2015-05-14 ENCOUNTER — Other Ambulatory Visit: Payer: Self-pay | Admitting: Neurology

## 2015-05-14 ENCOUNTER — Ambulatory Visit
Admission: RE | Admit: 2015-05-14 | Discharge: 2015-05-14 | Disposition: A | Discharge status: Home / Self Care | Payer: BLUE CROSS/BLUE SHIELD | Source: Ambulatory Visit | Attending: Neurology | Admitting: Neurology

## 2015-05-14 DIAGNOSIS — G35 Multiple sclerosis: Secondary | ICD-10-CM | POA: Diagnosis not present

## 2015-05-14 DIAGNOSIS — R200 Anesthesia of skin: Secondary | ICD-10-CM

## 2015-05-14 MED ORDER — GADOBENATE DIMEGLUMINE 529 MG/ML IV SOLN
20.0000 mL | Freq: Once | INTRAVENOUS | Status: AC | PRN
  Administered 2015-05-14: 20 mL via INTRAVENOUS

## 2015-05-16 ENCOUNTER — Telehealth: Payer: Self-pay | Admitting: *Deleted

## 2015-05-16 NOTE — Telephone Encounter (Signed)
I have spoken with Jacqueline Adams this afternoon and per RAS, advised that recent mri brain shows no changes when compared to the 2015 mri.  She verbalized understanding of same/fim

## 2015-05-16 NOTE — Telephone Encounter (Signed)
VM full/fim 

## 2015-05-16 NOTE — Telephone Encounter (Signed)
Pt returned call

## 2015-05-16 NOTE — Telephone Encounter (Signed)
-----   Message from Britt Bottom, MD sent at 05/15/2015  4:26 PM EDT ----- Please let her know that the MRI of the brain shows no changes compared to the 2015 MRI.

## 2015-05-18 ENCOUNTER — Telehealth: Payer: Self-pay

## 2015-05-18 NOTE — Telephone Encounter (Signed)
LMOVM - overbooked on 4/18. Pt to return call if we can reschedule her 

## 2015-05-30 ENCOUNTER — Ambulatory Visit: Payer: BLUE CROSS/BLUE SHIELD | Admitting: Hematology and Oncology

## 2015-06-06 ENCOUNTER — Encounter: Payer: Self-pay | Admitting: Hematology and Oncology

## 2015-06-06 ENCOUNTER — Telehealth: Payer: Self-pay | Admitting: Hematology and Oncology

## 2015-06-06 ENCOUNTER — Ambulatory Visit (HOSPITAL_BASED_OUTPATIENT_CLINIC_OR_DEPARTMENT_OTHER): Payer: BLUE CROSS/BLUE SHIELD | Admitting: Hematology and Oncology

## 2015-06-06 VITALS — BP 141/62 | HR 96 | Temp 98.2°F | Resp 18 | Wt 218.4 lb

## 2015-06-06 DIAGNOSIS — Z1501 Genetic susceptibility to malignant neoplasm of breast: Secondary | ICD-10-CM

## 2015-06-06 DIAGNOSIS — G35 Multiple sclerosis: Secondary | ICD-10-CM | POA: Diagnosis not present

## 2015-06-06 DIAGNOSIS — Z1509 Genetic susceptibility to other malignant neoplasm: Principal | ICD-10-CM

## 2015-06-06 NOTE — Telephone Encounter (Signed)
appt made and avs printed °

## 2015-06-06 NOTE — Progress Notes (Signed)
Patient Care Team: Robyne Peers, MD as PCP - General (Family Medicine) Lennon Alstrom, MD (Neurology) Jerrell Belfast, MD  DIAGNOSIS: BRCA-2  CHIEF COMPLIANT: Follow-up for breast cancer surveillance  INTERVAL HISTORY: Jacqueline Adams is a 55 year old with above-mentioned history of BRCA mutation who is here for surveillance examination. She reports no new problems or concerns with her breasts. She had a mammogram in March which was normal. She has not yet had a breast MRI for this year. She sees her gynecology but has not had oophorectomy.  REVIEW OF SYSTEMS:   Constitutional: Denies fevers, chills or abnormal weight loss Eyes: Denies blurriness of vision Ears, nose, mouth, throat, and face: Denies mucositis or sore throat Respiratory: Denies cough, dyspnea or wheezes Cardiovascular: Denies palpitation, chest discomfort Gastrointestinal:  Denies nausea, heartburn or change in bowel habits Skin: Denies abnormal skin rashes Lymphatics: Denies new lymphadenopathy or easy bruising Neurological:Denies numbness, tingling or new weaknesses Behavioral/Psych: Mood is stable, no new changes  Extremities: No lower extremity edema Breast:  denies any pain or lumps or nodules in either breasts All other systems were reviewed with the patient and are negative.  I have reviewed the past medical history, past surgical history, social history and family history with the patient and they are unchanged from previous note.  ALLERGIES:  is allergic to novocain and relafen.  MEDICATIONS:  Current Outpatient Prescriptions  Medication Sig Dispense Refill  . amoxicillin-clavulanate (AUGMENTIN) 875-125 MG tablet     . baclofen (LIORESAL) 10 MG tablet Take 10 mg by mouth. Reported on 05/09/2015    . cyclobenzaprine (FLEXERIL) 5 MG tablet Reported on 05/09/2015    . fluconazole (DIFLUCAN) 150 MG tablet     . fluticasone (FLONASE) 50 MCG/ACT nasal spray Place 2 sprays into both nostrils daily. 16 g 2   . HYDROcodone-homatropine (HYCODAN) 5-1.5 MG/5ML syrup Take 5 mLs by mouth 2 (two) times daily. (Patient not taking: Reported on 05/23/2014) 40 mL 0  . ibuprofen (ADVIL,MOTRIN) 400 MG tablet Take 400 mg by mouth every 8 (eight) hours as needed.      . interferon beta-1a (AVONEX PREFILLED) 30 MCG/0.5ML PSKT injection Inject 0.5 mLs (30 mcg total) into the muscle every 7 (seven) days. 12 each 3  . methylPREDNISolone (MEDROL DOSEPAK) 4 MG TBPK tablet Reported on 05/09/2015    . Multiple Vitamins-Minerals (CENTRUM SILVER ADULT 50+ PO) Take by mouth.    . promethazine (PHENERGAN) 25 MG tablet Take 25 mg by mouth every 6 (six) hours as needed. Reported on 05/09/2015    . simvastatin (ZOCOR) 20 MG tablet Take 1 tablet by mouth daily.    . VENTOLIN HFA 108 (90 BASE) MCG/ACT inhaler Inhale 2 puffs into the lungs every 6 (six) hours as needed.    . Vitamin D, Ergocalciferol, (DRISDOL) 50000 units CAPS capsule TAKE 1 CAPSULE (50,000 UNITS TOTAL) BY MOUTH EVERY 7 (SEVEN) DAYS. 12 capsule 0   No current facility-administered medications for this visit.    PHYSICAL EXAMINATION: ECOG PERFORMANCE STATUS: 0 - Asymptomatic  Filed Vitals:   06/06/15 1229  BP: 141/62  Pulse: 96  Temp: 98.2 F (36.8 C)  Resp: 18   Filed Weights   06/06/15 1229  Weight: 218 lb 6.4 oz (99.066 kg)    GENERAL:alert, no distress and comfortable SKIN: skin color, texture, turgor are normal, no rashes or significant lesions EYES: normal, Conjunctiva are pink and non-injected, sclera clear OROPHARYNX:no exudate, no erythema and lips, buccal mucosa, and tongue normal  NECK: supple,  thyroid normal size, non-tender, without nodularity LYMPH:  no palpable lymphadenopathy in the cervical, axillary or inguinal LUNGS: clear to auscultation and percussion with normal breathing effort HEART: regular rate & rhythm and no murmurs and no lower extremity edema ABDOMEN:abdomen soft, non-tender and normal bowel sounds MUSCULOSKELETAL:no  cyanosis of digits and no clubbing  NEURO: alert & oriented x 3 with fluent speech, no focal motor/sensory deficits EXTREMITIES: No lower extremity edema  LABORATORY DATA:  I have reviewed the data as listed   Chemistry      Component Value Date/Time   NA 143 05/09/2015 0937   NA 143 11/22/2013 1514   NA 139 02/06/2011 1532   K 4.1 05/09/2015 0937   K 3.1* 11/22/2013 1514   CL 103 05/09/2015 0937   CL 105 02/18/2012 1207   CO2 22 05/09/2015 0937   CO2 25 11/22/2013 1514   BUN 6 05/09/2015 0937   BUN 6.6* 11/22/2013 1514   BUN 8 02/06/2011 1532   CREATININE 0.67 05/09/2015 0937   CREATININE 0.8 11/22/2013 1514      Component Value Date/Time   CALCIUM 9.4 05/09/2015 0937   CALCIUM 9.0 11/22/2013 1514   ALKPHOS 88 05/09/2015 0937   ALKPHOS 91 11/22/2013 1514   AST 21 05/09/2015 0937   AST 15 11/22/2013 1514   ALT 18 05/09/2015 0937   ALT 13 11/22/2013 1514   BILITOT 0.2 05/09/2015 0937   BILITOT 0.23 11/22/2013 1514   BILITOT 0.2* 02/06/2011 1532       Lab Results  Component Value Date   WBC 9.1 05/09/2015   HGB 11.5* 11/22/2013   HCT 37.6 05/09/2015   MCV 76* 05/09/2015   PLT 306 05/09/2015   NEUTROABS 4.2 05/09/2015    ASSESSMENT & PLAN:   BRCA2 mutation positive: Patient plans to get oophorectomy done with Dr. Ruthann Cancer. I will call Dr. Ruthann Cancer and discussed this with him.  Breast cancer surveillance:  1. Mammogram 04/29/2015 is normal, breast MRI pending 2. Breast examination 06/06/2015 is normal  Multiple sclerosis: Patient is currently on treatment and appears to be doing better.  Return to clinic in one year for follow-up   No orders of the defined types were placed in this encounter.   The patient has a good understanding of the overall plan. she agrees with it. she will call with any problems that may develop before the next visit here.   Rulon Eisenmenger, MD 06/06/2015

## 2015-06-06 NOTE — Assessment & Plan Note (Signed)
BRCA2 mutation positive: Patient plans to get oophorectomy done with Dr. Ruthann Cancer   Breast cancer surveillance:  1. Mammogram 04/29/2015 is normal, breast MRI pending 2. Breasts examination 06/06/2015 is normal  Multiple sclerosis: Patient is currently on treatment and appears to be doing better.  Return to clinic in one year for follow-up

## 2015-06-08 ENCOUNTER — Telehealth: Payer: Self-pay

## 2015-06-08 NOTE — Telephone Encounter (Signed)
Unable to leave message mailbox full.

## 2015-06-08 NOTE — Telephone Encounter (Signed)
Advised pt Dr. [name] had talked with Dr. Ruthann Cancer about oopherectomy.  Dr. Ruthann Cancer has requested pt call his office and schedule an appt.  Pt voiced understanding.

## 2015-06-13 ENCOUNTER — Inpatient Hospital Stay: Admission: RE | Admit: 2015-06-13 | Payer: BLUE CROSS/BLUE SHIELD | Source: Ambulatory Visit

## 2015-06-21 ENCOUNTER — Inpatient Hospital Stay: Admission: RE | Admit: 2015-06-21 | Payer: BLUE CROSS/BLUE SHIELD | Source: Ambulatory Visit

## 2015-06-30 ENCOUNTER — Ambulatory Visit
Admission: RE | Admit: 2015-06-30 | Discharge: 2015-06-30 | Disposition: A | Discharge status: Home / Self Care | Payer: BLUE CROSS/BLUE SHIELD | Source: Ambulatory Visit | Attending: Hematology and Oncology | Admitting: Hematology and Oncology

## 2015-06-30 DIAGNOSIS — Z1501 Genetic susceptibility to malignant neoplasm of breast: Secondary | ICD-10-CM

## 2015-06-30 DIAGNOSIS — Z1509 Genetic susceptibility to other malignant neoplasm: Principal | ICD-10-CM

## 2015-06-30 MED ORDER — GADOBENATE DIMEGLUMINE 529 MG/ML IV SOLN
20.0000 mL | Freq: Once | INTRAVENOUS | Status: AC | PRN
  Administered 2015-06-30: 20 mL via INTRAVENOUS

## 2015-07-07 DIAGNOSIS — J301 Allergic rhinitis due to pollen: Secondary | ICD-10-CM | POA: Insufficient documentation

## 2015-11-09 ENCOUNTER — Encounter: Payer: Self-pay | Admitting: Neurology

## 2015-11-09 ENCOUNTER — Ambulatory Visit (INDEPENDENT_AMBULATORY_CARE_PROVIDER_SITE_OTHER): Payer: BLUE CROSS/BLUE SHIELD | Admitting: Neurology

## 2015-11-09 VITALS — BP 122/80 | HR 74 | Resp 18 | Ht 64.0 in | Wt 220.0 lb

## 2015-11-09 DIAGNOSIS — G35 Multiple sclerosis: Secondary | ICD-10-CM | POA: Diagnosis not present

## 2015-11-09 DIAGNOSIS — R5383 Other fatigue: Secondary | ICD-10-CM | POA: Diagnosis not present

## 2015-11-09 DIAGNOSIS — G43009 Migraine without aura, not intractable, without status migrainosus: Secondary | ICD-10-CM

## 2015-11-09 DIAGNOSIS — R200 Anesthesia of skin: Secondary | ICD-10-CM | POA: Diagnosis not present

## 2015-11-09 NOTE — Progress Notes (Signed)
[facility]  PATIENT: Jacqueline Adams DOB: 28-Nov-1960  REFERRING DOCTOR OR PCP:  Rolan Lipa  SOURCE: patientand records form [facility]  _________________________________   HISTORICAL  CHIEF COMPLAINT:  Chief Complaint  Patient presents with  . Multiple Sclerosis    Sts. she continues to tolerate Avonex well.  Denies new or worsening sx./fim    HISTORY OF PRESENT ILLNESS:  Jacqueline Adams is a 55 yo woman with MS diagnosed in 2009.   She Switched back to Avonex from Tecfidera due to side effects in in 2016 . He denies any exacerbations.  I personally reviewed the MRI of the brain dated 05/14/2015. It shows T2/FLAIR hyperintense foci in the periventricular and juxtacortical white matter. Silent located in the left hemisphere. None of the foci appears to be acute. When compared to the MRI from 12/07/2013, there was no interval change.  Gait/strength/sensation:  She denies problems with her gait or with strength.    She denies spasticity.  She has mild right > left foot numbness and tingling.  This has been present x years and is not too bothersome   Bladder/bowel:    She denies any urinary urgency, frequency or hesitancy.  She has 2-3  x nocturia but quickly falls back asleep.   Frequency is not bad enough for her to want a medication.     She feels she empties well.   No UTI's    Npo bowel issues  Vision   She denies any MS related visual problems.  She has slightly elevate IOP but not bad enough to be called glaucoma and she is followed q 6 months for tonometry.     Fatigue/sleep:   She has fatigue that is 24/7 and is worse with heat..  She travels some for her job (did a lot in the spring but less for rest of year)  She also is going through menopause and has hot flashes.      She usually sleeps well but is doing worse with menopause.    Mood/Cognition:   She denies any depression or anxiety.    Last year was tougher with sister's death. She  works as a Garment/textile technologist for [employer].    She denies any cognitive difficulty.       Pain:    She has cervical disc degeneration and does some PT with benefit.    Her hip biursitis resolved.     Migraines:   She has a history of migraine headaches for the past 20 years but currently they are rare until allergy season, triggered by Ragweed mostly.    MS History:  In 2009, she had the onset of right arm and leg tingling and clumsiness. At the time, she was seeing Dr. Earley Favor for migraines and he performed a nerve conduction study only showing mild carpal tunnel syndrome. Symptoms worsened the next day and she was referred to Dr. Erling Cruz. An MRI of the brain, cervical spine and a lumbar puncture was performed. The brain MRI showed a large left frontoparietal periventricular focus and several other foci. CSF was consistent with multiple sclerosis.  MRI of the cervical spine was normal. She was started on Avonex.   She was switched to Tecfidera in late 2014 as she had one new focus on her 2014 MRI. She switched back to Avonex in 2016.[name]   REVIEW OF SYSTEMS: Constitutional: No fevers, chills, sweats, or change in appetite.   Some fatigue Eyes: No visual changes, double vision, eye pain.  Being followed for borderline high intraocular pressure. Ear, nose and throat: No hearing loss, ear pain/.    Respiratory: No shortness of breath at rest or with exertion.   No wheezes.   Currently being treated for bronchitis. Cardiovascular: No chest pain, palpitations GastrointestinaI: No nausea, vomiting, diarrhea, abdominal pain, fecal incontinence Genitourinary: No dysuria.  She has urgency and or frequency.  2-3 x  nocturia. Musculoskeletal: No neck pain, back pain Integumentary: No rash, pruritus, skin lesions Neurological: as above Psychiatric: No depression at this time.  No anxiety Endocrine: No palpitations, diaphoresis, change in appetite, change in weigh or increased thirst Hematologic/Lymphatic: No  anemia, purpura, petechiae. Allergic/Immunologic: No itchy/runny eyes, nasal congestion, recent allergic reactions, rashes  ALLERGIES: Allergies  Allergen Reactions  . Novocain [Procaine] Swelling  . Relafen [Nabumetone] Swelling    HOME MEDICATIONS:  Current Outpatient Prescriptions:  .  cyclobenzaprine (FLEXERIL) 5 MG tablet, Reported on 05/09/2015, Disp: , Rfl:  .  fluconazole (DIFLUCAN) 150 MG tablet, , Disp: , Rfl:  .  fluticasone (FLONASE) 50 MCG/ACT nasal spray, Place 2 sprays into both nostrils daily., Disp: 16 g, Rfl: 2 .  ibuprofen (ADVIL,MOTRIN) 400 MG tablet, Take 400 mg by mouth every 8 (eight) hours as needed.  , Disp: , Rfl:  .  interferon beta-1a (AVONEX PREFILLED) 30 MCG/0.5ML PSKT injection, Inject 0.5 mLs (30 mcg total) into the muscle every 7 (seven) days., Disp: 12 each, Rfl: 3 .  Multiple Vitamins-Minerals (CENTRUM SILVER ADULT 50+ PO), Take by mouth., Disp: , Rfl:  .  promethazine (PHENERGAN) 25 MG tablet, Take 25 mg by mouth every 6 (six) hours as needed. Reported on 05/09/2015, Disp: , Rfl:  .  VENTOLIN HFA 108 (90 BASE) MCG/ACT inhaler, Inhale 2 puffs into the lungs every 6 (six) hours as needed., Disp: , Rfl:  .  Vitamin D, Ergocalciferol, (DRISDOL) 50000 units CAPS capsule, TAKE 1 CAPSULE (50,000 UNITS TOTAL) BY MOUTH EVERY 7 (SEVEN) DAYS., Disp: 12 capsule, Rfl: 0 .  methylPREDNISolone (MEDROL DOSEPAK) 4 MG TBPK tablet, Reported on 05/09/2015, Disp: , Rfl:  .  simvastatin (ZOCOR) 20 MG tablet, Take 1 tablet by mouth daily., Disp: , Rfl:   PAST MEDICAL HISTORY: Past Medical History:  Diagnosis Date  . Bronchitis   . Cervical spondylarthritis   . Headache(784.0)   . High cholesterol   . Leukocytosis 2009  . Multiple sclerosis ([place]) 2009   followed by Dr. Erling Cruz  . Rhinitis   . Sinusitis     PAST SURGICAL HISTORY: Past Surgical History:  Procedure Laterality Date  . CESAREAN SECTION     x 3  . PARTIAL HYSTERECTOMY  2006   for uterine fibroid.      FAMILY HISTORY: Family History  Problem Relation Age of Onset  . Cancer Mother 26    ovarian cancer; now with suspected breast cancer  . Diabetes Mother     also HTN  . Clotting disorder Father   . Breast cancer Maternal Aunt   . Cancer Maternal Grandmother     unknown female reproductive organ cancer  . Breast cancer Maternal Aunt   . Breast cancer Maternal Aunt   . Colon cancer Sister 72    anal cancer (2014)  . Lung cancer Sister 81  . Stroke Sister 6  . Ovarian cancer Sister 58  . BRCA 1/2 Sister     SOCIAL HISTORY:  Social History   Social History  . Marital status: Married    Spouse name: [name]  . Number  of children: 2  . 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 Smoking status: Never Smoker  . Smokeless tobacco: Never Used  . Alcohol use No  . Drug use: No  . Sexual activity: Yes    Birth control/ protection: None, Surgical   Other Topics Concern  . Not on file   Social History Narrative   Patient lives at home with her spouse.   Caffeine Use: 2 sodas daily     PHYSICAL EXAM  Vitals:   11/09/15 0851  BP: 122/80  Pulse: 74  Resp: 18  Weight: 220 lb (99.8 kg)  Height: 5' 4"  (1.626 m)    Body mass index is 37.76 kg/m.   General: The patient is well-developed and well-nourished and in no acute distress  Eyes:  Funduscopic exam shows normal optic discs and retinal vessels.  Neck: The neck is supple, no carotid bruits are noted.  The neck is nontender.  Cardiovascular: The heart has a regular rate and rhythm with a normal S1 and S2. There were no murmurs, gallops or rubs. Lungs are clear to auscultation.  Skin: Extremities are without rash or   edema.  Musculoskeletal:  Back is nontender.       Neurologic Exam  Mental status: The patient is alert and oriented x 3 at the time of the examination. The patient has apparent normal recent and remote memory, with an  apparently normal attention span and concentration ability.   Speech is normal.  Cranial nerves: Extraocular movements are full.   There is good facial sensation to soft touch bilaterally.Facial strength is normal.  Trapezius and sternocleidomastoid strength is normal. No dysarthria is noted.  The tongue is midline, and the patient has symmetric elevation of the soft palate. No obvious hearing deficits are noted.  Motor:  Muscle bulk is normal.   Tone is normal. Strength is  5 / 5 in all 4 extremities.   Sensory: Sensory testing is intact to pinprick, soft touch and vibration sensation in all 4 extremities.  Coordination: Cerebellar testing reveals good finger-nose-finger and heel-to-shin bilaterally.  Gait and station: Station is normal.   Gait is normal. Tandem gait is normal. Romberg is negative.   Reflexes: Deep tendon reflexes are symmetric and normal bilaterally.       DIAGNOSTIC DATA (LABS, IMAGING, TESTING) - I reviewed patient records, labs, notes, testing and imaging myself where available.  Lab Results  Component Value Date   WBC 9.1 05/09/2015   HGB 11.5 (L) 11/22/2013   HCT 37.6 05/09/2015   MCV 76 (L) 05/09/2015   PLT 306 05/09/2015      Component Value Date/Time   NA 143 05/09/2015 0937   NA 143 11/22/2013 1514   K 4.1 05/09/2015 0937   K 3.1 (L) 11/22/2013 1514   CL 103 05/09/2015 0937   CL 105 02/18/2012 1207   CO2 22 05/09/2015 0937   CO2 25 11/22/2013 1514   GLUCOSE 105 (H) 05/09/2015 0937   GLUCOSE 105 11/22/2013 1514   GLUCOSE 96 02/18/2012 1207   BUN 6 05/09/2015 0937   BUN 6.6 (L) 11/22/2013 1514   CREATININE 0.67 05/09/2015 0937   CREATININE 0.8 11/22/2013 1514   CALCIUM 9.4 05/09/2015 0937   CALCIUM 9.0 11/22/2013 1514   PROT 7.0 05/09/2015 0937   PROT 7.4 11/22/2013 1514   ALBUMIN 4.0 05/09/2015 0937   ALBUMIN 3.3 (L) 11/22/2013 1514   AST 21  05/09/2015 0937   AST 15 11/22/2013 1514   ALT 18 05/09/2015 0937   ALT 13 11/22/2013 1514    ALKPHOS 88 05/09/2015 0937   ALKPHOS 91 11/22/2013 1514   BILITOT 0.2 05/09/2015 0937   BILITOT 0.23 11/22/2013 1514   GFRNONAA 101 05/09/2015 0937   GFRAA 117 05/09/2015 0937       ASSESSMENT AND PLAN  Multiple sclerosis (HCC)  Numbness  Other fatigue  Migraine without aura and without status migrainosus, not intractable   1.   She will continue Avonex.   She had blood work a few months ago with her primary care doctor. 2.   Remain active and exercise as tolerated. 3.   We discussed getting a standup adjustable desk at work that may help her with some of her muscular skeletal pain issues. 4.   She will return to see me in 6 months or sooner if there are new or worsening neurologic symptoms.   Hakeen Shipes A. Felecia Shelling, MD, PhD 4/62/1947, 1:25 AM Certified in Neurology, Clinical Neurophysiology, Sleep Medicine, Pain Medicine and Neuroimaging  [facility] [address] [place], [place] [address] [phone]

## 2015-11-22 NOTE — Progress Notes (Signed)
PATIENT HAS BEEN REFERRED TO THE NATIONAL MS SOCIETY.  REFERRAL FORM COMPLETED AND FAXED IN/fim

## 2015-11-23 ENCOUNTER — Other Ambulatory Visit: Payer: Self-pay | Admitting: Neurology

## 2016-05-02 ENCOUNTER — Telehealth: Payer: Self-pay | Admitting: Neurology

## 2016-05-02 NOTE — Telephone Encounter (Signed)
Rn spoke with Raquel Sarna in MRI referrals. Pt was last seen 10/2015. PTs insurance requires clinical notes before a MRI can be order.

## 2016-05-02 NOTE — Telephone Encounter (Signed)
Patient called office requesting to have MRI done prior to have appointment next week with Dr. Felecia Shelling.  Please call

## 2016-05-02 NOTE — Telephone Encounter (Signed)
Rn call patient that MRI cannot be order until she sees Dr. Felecia Shelling next week. Rn stated there will be no clinical notes to get it authorized. Pt verbalized understanding.

## 2016-05-09 ENCOUNTER — Encounter (INDEPENDENT_AMBULATORY_CARE_PROVIDER_SITE_OTHER): Payer: Self-pay

## 2016-05-09 ENCOUNTER — Encounter: Payer: Self-pay | Admitting: Neurology

## 2016-05-09 ENCOUNTER — Ambulatory Visit (INDEPENDENT_AMBULATORY_CARE_PROVIDER_SITE_OTHER): Payer: BLUE CROSS/BLUE SHIELD | Admitting: Neurology

## 2016-05-09 VITALS — BP 122/80 | HR 80 | Resp 16 | Ht 64.0 in | Wt 221.0 lb

## 2016-05-09 DIAGNOSIS — R200 Anesthesia of skin: Secondary | ICD-10-CM | POA: Diagnosis not present

## 2016-05-09 DIAGNOSIS — R5383 Other fatigue: Secondary | ICD-10-CM

## 2016-05-09 DIAGNOSIS — R350 Frequency of micturition: Secondary | ICD-10-CM

## 2016-05-09 DIAGNOSIS — G43009 Migraine without aura, not intractable, without status migrainosus: Secondary | ICD-10-CM | POA: Diagnosis not present

## 2016-05-09 DIAGNOSIS — M542 Cervicalgia: Secondary | ICD-10-CM

## 2016-05-09 DIAGNOSIS — R252 Cramp and spasm: Secondary | ICD-10-CM | POA: Diagnosis not present

## 2016-05-09 DIAGNOSIS — G35 Multiple sclerosis: Secondary | ICD-10-CM | POA: Diagnosis not present

## 2016-05-09 MED ORDER — CYCLOBENZAPRINE HCL 5 MG PO TABS: 5.0000 mg | ORAL_TABLET | Freq: Three times a day (TID) | ORAL | 5 refills | Status: DC | PRN

## 2016-05-09 NOTE — Progress Notes (Signed)
[facility]  PATIENT: Jacqueline Adams DOB: 1960/04/02  REFERRING DOCTOR OR PCP:  Rolan Lipa  SOURCE: patientand records form [facility]  _________________________________   HISTORICAL  CHIEF COMPLAINT:  Chief Complaint  Patient presents with  . Multiple Sclerosis    Sts. she continues to tolerate Avonex well.  Sts. she has had constant sinus infections, h/a's over the last several mos. She c/o more cramping right hand./fim    HISTORY OF PRESENT ILLNESS:  Jacqueline Adams is a 56 yo woman with MS diagnosed in 2009.     Since the last visit, she has noted worse coordination and spasms in her right hand.   MS:   She feels that she has been stable on Avonex. In 2016 she switch back to Avonex from Tecfidera due to side effects. She has not had any exacerbations.  I have reviewed the MRI of the brain dated 05/14/2015. It shows T2/FLAIR hyperintense foci in the periventricular and juxtacortical white matter, mostly locatedin the left hemisphere. None of the foci appears to be acute. When compared to the MRI from 12/07/2013, there was no interval change.  Headaches:   She is having more pressure like headaches in her face near the sinuses.    These are distinct from her migraines.     Gait/strength/sensation:  She denies problems with her gait or with strength. She is noting spasms of the right arm that will last for about a minute and then relax again. With activities present she is clumsy in the arm. She also has noted this in her legs but it is less troublesome than in the arm.  At times, the right foot locks up and she stumbles.    She has mild right > left foot numbness and tingling, present x years.  Bladder/bowel:    She denies any urinary urgency, frequency or hesitancy.  She has 2-3  x nocturia but quickly falls back asleep.   Frequency is not bad enough for her to want a medication.     She feels she empties well.   No UTI's    Npo bowel  issues  Vision   She feels vision is good and she denies any MS related visual problems.  She has slightly elevate IOP but not bad enough to be called glaucoma and she is followed q 6 months for tonometry.   Eyes often itchy and she uses drops.    Fatigue/sleep:   She has fatigue that is 24/7 and is worse with heat..  She travels some for her job (did a lot in the spring but less for rest of year)  She also is going through menopause and has hot flashes.      She usually sleeps well but is doing worse with menopause.    Mood/Cognition:   She denies any depression or anxiety.    Last year was tougher with sister's death. She works as a Garment/textile technologist for [employer].    She denies any cognitive difficulty.       Neck Pain:    Neck pain bothers her more some days despite changing her workstation around to have the monitor at eye level.  It bothers her more when she has a headache. She has cervical disc degeneration.     Vit D was low in the past.    MS History:  In 2009, she had the onset of right arm and leg tingling and clumsiness. At the time, she was seeing Dr. Earley Favor for migraines  and he performed a nerve conduction study only showing mild carpal tunnel syndrome. Symptoms worsened the next day and she was referred to Dr. Erling Cruz. An MRI of the brain, cervical spine and a lumbar puncture was performed. The brain MRI showed a large left frontoparietal periventricular focus and several other foci. CSF was consistent with multiple sclerosis.  MRI of the cervical spine was normal. She was started on Avonex.   She was switched to Tecfidera in late 2014 as she had one new focus on her 2014 MRI. She switched back to Avonex in 2016.[name]   REVIEW OF SYSTEMS: Constitutional: No fevers, chills, sweats, or change in appetite.   She has fatigue Eyes: No visual changes, double vision, eye pain.   Being followed for borderline high intraocular pressure. Ear, nose and throat: No hearing loss, ear pain/.     Respiratory: No shortness of breath at rest or with exertion.   No wheezes.   She has frequent bronchitis. Cardiovascular: No chest pain, palpitations GastrointestinaI: No nausea, vomiting, diarrhea, abdominal pain, fecal incontinence Genitourinary: No dysuria.  She has urgency and or frequency.  2-3 x  nocturia. Musculoskeletal: No neck pain, back pain Integumentary: No rash, pruritus, skin lesions Neurological: as above Psychiatric: No depression at this time.  No anxiety Endocrine: No palpitations, diaphoresis, change in appetite, change in weigh or increased thirst Hematologic/Lymphatic: No anemia, purpura, petechiae. Allergic/Immunologic: No itchy/runny eyes, nasal congestion, recent allergic reactions, rashes  ALLERGIES: Allergies  Allergen Reactions  . Novocain [Procaine] Swelling  . Relafen [Nabumetone] Swelling    HOME MEDICATIONS:  Current Outpatient Prescriptions:  .  AVONEX PREFILLED 30 MCG/0.5ML PSKT injection, INJECT 30 MCG INTRAMUSCULARLY EVERY 7 DAYS, Disp: 3 kit, Rfl: 3 .  cyclobenzaprine (FLEXERIL) 5 MG tablet, Take 1 tablet (5 mg total) by mouth 3 (three) times daily as needed for muscle spasms., Disp: 90 tablet, Rfl: 5 .  fluconazole (DIFLUCAN) 150 MG tablet, , Disp: , Rfl:  .  fluticasone (FLONASE) 50 MCG/ACT nasal spray, Place 2 sprays into both nostrils daily., Disp: 16 g, Rfl: 2 .  ibuprofen (ADVIL,MOTRIN) 400 MG tablet, Take 400 mg by mouth every 8 (eight) hours as needed.  , Disp: , Rfl:  .  Multiple Vitamins-Minerals (CENTRUM SILVER ADULT 50+ PO), Take by mouth., Disp: , Rfl:  .  simvastatin (ZOCOR) 20 MG tablet, Take 1 tablet by mouth daily., Disp: , Rfl:  .  VENTOLIN HFA 108 (90 BASE) MCG/ACT inhaler, Inhale 2 puffs into the lungs every 6 (six) hours as needed., Disp: , Rfl:  .  methylPREDNISolone (MEDROL DOSEPAK) 4 MG TBPK tablet, Reported on 05/09/2015, Disp: , Rfl:   PAST MEDICAL HISTORY: Past Medical History:  Diagnosis Date  . Bronchitis   .  Cervical spondylarthritis   . Headache(784.0)   . High cholesterol   . Leukocytosis 2009  . Multiple sclerosis ([place]) 2009   followed by Dr. Erling Cruz  . Rhinitis   . Sinusitis     PAST SURGICAL HISTORY: Past Surgical History:  Procedure Laterality Date  . CESAREAN SECTION     x 3  . PARTIAL HYSTERECTOMY  2006   for uterine fibroid.     FAMILY HISTORY: Family History  Problem Relation Age of Onset  . Cancer Mother 45    ovarian cancer; now with suspected breast cancer  . Diabetes Mother     also HTN  . Clotting disorder Father   . Breast cancer Maternal Aunt   . Cancer Maternal Grandmother  unknown female reproductive organ cancer  . Breast cancer Maternal Aunt   . Breast cancer Maternal Aunt   . Colon cancer Sister 109    anal cancer (2014)  . Lung cancer Sister 75  . Stroke Sister 57  . Ovarian cancer Sister 80  . BRCA 1/2 Sister     SOCIAL HISTORY:  Social History   Social History  . Marital status: Married    Spouse name: [name]  . Number of children: 2  . 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 Smoking status: Never Smoker  . Smokeless tobacco: Never Used  . Alcohol use No  . Drug use: No  . Sexual activity: Yes    Birth control/ protection: None, Surgical   Other Topics Concern  . Not on file   Social History Narrative   Patient lives at home with her spouse.   Caffeine Use: 2 sodas daily     PHYSICAL EXAM  Vitals:   05/09/16 0825  BP: 122/80  Pulse: 80  Resp: 16  Weight: 221 lb (100.2 kg)  Height: _0  (1.626 m)    Body mass index is 37.93 kg/m.   General: The patient is well-developed and well-nourished and in no acute distress   Neck: The neck is supple, no carotid bruits are noted.  The neck is nontender.   ROM good   Skin: Extremities are without rash or edema.  Musculoskeletal:  Back is nontender.       Neurologic Exam  Mental status: The patient  is alert and oriented x 3 at the time of the examination. The patient has apparent normal recent and remote memory, with an apparently normal attention span and concentration ability.   Speech is normal.  Cranial nerves: Extraocular movements are full.   There is good facial sensation to soft touch bilaterally.Facial strength is normal.  Trapezius and sternocleidomastoid strength is normal. No dysarthria is noted.  The tongue is midline, and the patient has symmetric elevation of the soft palate. No obvious hearing deficits are noted.  Motor:  Muscle bulk is normal.   Tone is normal. Strength is  5 / 5 in all 4 extremities.   Sensory: Sensory testing is intact to touch anf vibration sensation in all 4 extremities.  Coordination: Cerebellar testing reveals good finger-nose-finger and heel-to-shin bilaterally.  Gait and station: Station is normal.   Gait is normal. Tandem gait is wide.   Romberg is negative.   Reflexes: Deep tendon reflexes are symmetric 3+  Bilaterally with spread at right knee    DIAGNOSTIC DATA (LABS, IMAGING, TESTING) - I reviewed patient records, labs, notes, testing and imaging myself where available.  Lab Results  Component Value Date   WBC 9.1 05/09/2015   HGB 11.5 (L) 11/22/2013   HCT 37.6 05/09/2015   MCV 76 (L) 05/09/2015   PLT 306 05/09/2015      Component Value Date/Time   NA 143 05/09/2015 0937   NA 143 11/22/2013 1514   K 4.1 05/09/2015 0937   K 3.1 (L) 11/22/2013 1514   CL 103 05/09/2015 0937   CL 105 02/18/2012 1207   CO2 22 05/09/2015 0937   CO2 25 11/22/2013 1514   GLUCOSE 105 (H) 05/09/2015 0937   GLUCOSE 105 11/22/2013 1514   GLUCOSE 96 02/18/2012 1207   BUN 6 05/09/2015 0937   BUN 6.6 (L) 11/22/2013 1514   CREATININE 0.67 05/09/2015 6979  CREATININE 0.8 11/22/2013 1514   CALCIUM 9.4 05/09/2015 0937   CALCIUM 9.0 11/22/2013 1514   PROT 7.0 05/09/2015 0937   PROT 7.4 11/22/2013 1514   ALBUMIN 4.0 05/09/2015 0937   ALBUMIN 3.3 (L)  11/22/2013 1514   AST 21 05/09/2015 0937   AST 15 11/22/2013 1514   ALT 18 05/09/2015 0937   ALT 13 11/22/2013 1514   ALKPHOS 88 05/09/2015 0937   ALKPHOS 91 11/22/2013 1514   BILITOT 0.2 05/09/2015 0937   BILITOT 0.23 11/22/2013 1514   GFRNONAA 101 05/09/2015 0937   GFRAA 117 05/09/2015 0937       ASSESSMENT AND PLAN  Multiple sclerosis (HCC) - Plan: MR BRAIN W WO CONTRAST, MR CERVICAL SPINE W WO CONTRAST  Migraine without aura and without status migrainosus, not intractable  Other fatigue  Numbness  Urinary frequency  Cervical pain  Spasms of the hands or feet - Plan: MR BRAIN W WO CONTRAST, MR CERVICAL SPINE W WO CONTRAST   1.   She will continue Avonex.   Due to new symptoms with phasic spasms in the right arm, we will check an MRI of the brain and cervical spine to determine if she is having subclinical progression of her multiple sclerosis. If this is occurring, we will need to reconsider a different disease modifying therapy.. 2.   Remain active and exercise as tolerated. 3.   Flexeril 5 mg up to tid 4.   She will return to see me in 6 months or sooner if there are new or worsening neurologic symptoms.   Aracely Rickett A. Felecia Shelling, MD, PhD 1/88/4166, 0:63 AM Certified in Neurology, Clinical Neurophysiology, Sleep Medicine, Pain Medicine and Neuroimaging  [facility] [address], [place], [place] [address] [phone]

## 2016-05-22 ENCOUNTER — Ambulatory Visit (INDEPENDENT_AMBULATORY_CARE_PROVIDER_SITE_OTHER): Payer: BLUE CROSS/BLUE SHIELD

## 2016-05-22 DIAGNOSIS — G35 Multiple sclerosis: Secondary | ICD-10-CM | POA: Diagnosis not present

## 2016-05-22 DIAGNOSIS — R252 Cramp and spasm: Secondary | ICD-10-CM | POA: Diagnosis not present

## 2016-05-22 MED ORDER — GADOPENTETATE DIMEGLUMINE 469.01 MG/ML IV SOLN: 20.0000 mL | Freq: Once | INTRAVENOUS | Status: DC | PRN

## 2016-05-28 ENCOUNTER — Telehealth: Payer: Self-pay | Admitting: *Deleted

## 2016-05-28 NOTE — Telephone Encounter (Signed)
LMOM (identified vm) that per RAS, MRI's of brain and c/s show no new MS lesions.  She does not need to return this call unless she has questions/fim

## 2016-05-28 NOTE — Telephone Encounter (Signed)
-----   Message from Britt Bottom, MD sent at 05/24/2016  1:05 PM EDT ----- Please note that the MRIs of the brain and cervical spine did not show any new MS lesions.

## 2016-06-06 ENCOUNTER — Ambulatory Visit (HOSPITAL_BASED_OUTPATIENT_CLINIC_OR_DEPARTMENT_OTHER): Payer: BLUE CROSS/BLUE SHIELD | Admitting: Hematology and Oncology

## 2016-06-06 ENCOUNTER — Other Ambulatory Visit: Payer: Self-pay | Admitting: Hematology and Oncology

## 2016-06-06 ENCOUNTER — Encounter: Payer: Self-pay | Admitting: Hematology and Oncology

## 2016-06-06 DIAGNOSIS — Z1501 Genetic susceptibility to malignant neoplasm of breast: Secondary | ICD-10-CM

## 2016-06-06 DIAGNOSIS — G35 Multiple sclerosis: Secondary | ICD-10-CM | POA: Diagnosis not present

## 2016-06-06 DIAGNOSIS — Z1509 Genetic susceptibility to other malignant neoplasm: Principal | ICD-10-CM

## 2016-06-06 DIAGNOSIS — Z1231 Encounter for screening mammogram for malignant neoplasm of breast: Secondary | ICD-10-CM

## 2016-06-06 NOTE — Progress Notes (Signed)
Patient Care Team: Robyne Peers, MD as PCP - General (Family Medicine) Lennon Alstrom, MD (Neurology) Jerrell Belfast, MD  DIAGNOSIS:  Encounter Diagnosis  Name Primary?  [name] BRCA2 positive     CHIEF COMPLIANT: Annual follow-up of BRCA2 mutation positivity  INTERVAL HISTORY: Jacqueline Adams is a 56 year old with above-mentioned history of BRCA2 mutation who is currently on annual surveillance with mammograms and a breast MRIs. She also has multiple sclerosis which is being monitored and is felt to be stable. She denies any new lumps or nodules in the breasts.  REVIEW OF SYSTEMS:   Constitutional: Denies fevers, chills or abnormal weight loss Eyes: Denies blurriness of vision Ears, nose, mouth, throat, and face: Denies mucositis or sore throat Respiratory: Denies cough, dyspnea or wheezes Cardiovascular: Denies palpitation, chest discomfort Gastrointestinal:  Denies nausea, heartburn or change in bowel habits Skin: Denies abnormal skin rashes Lymphatics: Denies new lymphadenopathy or easy bruising Neurological:Denies numbness, tingling or new weaknesses Behavioral/Psych: Mood is stable, no new changes  Extremities: No lower extremity edema Breast:  denies any pain or lumps or nodules in either breasts All other systems were reviewed with the patient and are negative.  I have reviewed the past medical history, past surgical history, social history and family history with the patient and they are unchanged from previous note.  ALLERGIES:  is allergic to novocain [procaine] and relafen [nabumetone].  MEDICATIONS:  Current Outpatient Prescriptions  Medication Sig Dispense Refill  . AVONEX PREFILLED 30 MCG/0.5ML PSKT injection INJECT 30 MCG INTRAMUSCULARLY EVERY 7 DAYS 3 kit 3  . cyclobenzaprine (FLEXERIL) 5 MG tablet Take 1 tablet (5 mg total) by mouth 3 (three) times daily as needed for muscle spasms. 90 tablet 5  . fluconazole (DIFLUCAN) 150 MG tablet     . fluticasone  (FLONASE) 50 MCG/ACT nasal spray Place 2 sprays into both nostrils daily. 16 g 2  . ibuprofen (ADVIL,MOTRIN) 400 MG tablet Take 400 mg by mouth every 8 (eight) hours as needed.      . methylPREDNISolone (MEDROL DOSEPAK) 4 MG TBPK tablet Reported on 05/09/2015    . Multiple Vitamins-Minerals (CENTRUM SILVER ADULT 50+ PO) Take by mouth.    . simvastatin (ZOCOR) 20 MG tablet Take 1 tablet by mouth daily.    . VENTOLIN HFA 108 (90 BASE) MCG/ACT inhaler Inhale 2 puffs into the lungs every 6 (six) hours as needed.     No current facility-administered medications for this visit.    Facility-Administered Medications Ordered in Other Visits  Medication Dose Route Frequency Provider Last Rate Last Dose  . gadopentetate dimeglumine (MAGNEVIST) injection 20 mL  20 mL Intravenous Once PRN Britt Bottom, MD        PHYSICAL EXAMINATION: ECOG PERFORMANCE STATUS: 0 - Asymptomatic  Vitals:   06/06/16 0939  BP: 130/71  Pulse: 86  Resp: 18  Temp: 98.8 F (37.1 C)   Filed Weights   06/06/16 0939  Weight: 221 lb 1.6 oz (100.3 kg)    GENERAL:alert, no distress and comfortable SKIN: skin color, texture, turgor are normal, no rashes or significant lesions EYES: normal, Conjunctiva are pink and non-injected, sclera clear OROPHARYNX:no exudate, no erythema and lips, buccal mucosa, and tongue normal  NECK: supple, thyroid normal size, non-tender, without nodularity LYMPH:  no palpable lymphadenopathy in the cervical, axillary or inguinal LUNGS: clear to auscultation and percussion with normal breathing effort HEART: regular rate & rhythm and no murmurs and no lower extremity edema ABDOMEN:abdomen soft, non-tender and normal bowel  sounds MUSCULOSKELETAL:no cyanosis of digits and no clubbing  NEURO: alert & oriented x 3 with fluent speech, no focal motor/sensory deficits EXTREMITIES: No lower extremity edema BREAST: No palpable masses or nodules in either right or left breasts. No palpable axillary  supraclavicular or infraclavicular adenopathy no breast tenderness or nipple discharge. (exam performed in the presence of a chaperone)  LABORATORY DATA:  I have reviewed the data as listed   Chemistry      Component Value Date/Time   NA 143 05/09/2015 0937   NA 143 11/22/2013 1514   K 4.1 05/09/2015 0937   K 3.1 (L) 11/22/2013 1514   CL 103 05/09/2015 0937   CL 105 02/18/2012 1207   CO2 22 05/09/2015 0937   CO2 25 11/22/2013 1514   BUN 6 05/09/2015 0937   BUN 6.6 (L) 11/22/2013 1514   CREATININE 0.67 05/09/2015 0937   CREATININE 0.8 11/22/2013 1514      Component Value Date/Time   CALCIUM 9.4 05/09/2015 0937   CALCIUM 9.0 11/22/2013 1514   ALKPHOS 88 05/09/2015 0937   ALKPHOS 91 11/22/2013 1514   AST 21 05/09/2015 0937   AST 15 11/22/2013 1514   ALT 18 05/09/2015 0937   ALT 13 11/22/2013 1514   BILITOT 0.2 05/09/2015 0937   BILITOT 0.23 11/22/2013 1514       Lab Results  Component Value Date   WBC 9.1 05/09/2015   HGB 11.5 (L) 11/22/2013   HCT 37.6 05/09/2015   MCV 76 (L) 05/09/2015   PLT 306 05/09/2015   NEUTROABS 4.2 05/09/2015    ASSESSMENT & PLAN:  BRCA2 positive BRCA2 mutation positive: Patient plans to get oophorectomy done with Dr. Ruthann Cancer. I will call Dr. Ruthann Cancer and discussed this with him.  Breast cancer surveillance:  1. Mammogram 05/08/2015 is normal,  Breast MRI: 06/30/2015: No evidence of malignancy in bilateral breasts 2. Breast examination 06/06/2016 is normal  Multiple sclerosis: Patient is currently on treatment and appears to be doing better. Brain MRI 05/22/2016: Findings of multiple sclerosis  Return to clinic in one year for follow-up. She will need annual mammograms and breast MRIs. She is been set up for the mammogram already in May. I will set her up for a breast MRI to be done in November 2018. I requested a consultation from GYN oncology for oophorectomy.  I spent 25 minutes talking to the patient of which more than half  was spent in counseling and coordination of care.  No orders of the defined types were placed in this encounter.  The patient has a good understanding of the overall plan. she agrees with it. she will call with any problems that may develop before the next visit here.   Rulon Eisenmenger, MD 06/06/16

## 2016-06-06 NOTE — Assessment & Plan Note (Signed)
BRCA2 mutation positive: Patient plans to get oophorectomy done with Dr. Ruthann Cancer. I will call Dr. Ruthann Cancer and discussed this with him.  Breast cancer surveillance:  1. Mammogram 05/08/2015 is normal,  Breast MRI: 06/30/2015: No evidence of malignancy in bilateral breasts 2. Breast examination 06/06/2016 is normal  Multiple sclerosis: Patient is currently on treatment and appears to be doing better. Brain MRI 05/22/2016: Findings of multiple sclerosis  Return to clinic in one year for follow-up. She will need annual mammograms and breast MRIs.

## 2016-06-28 ENCOUNTER — Ambulatory Visit: Payer: BLUE CROSS/BLUE SHIELD

## 2016-07-04 ENCOUNTER — Ambulatory Visit: Payer: BLUE CROSS/BLUE SHIELD | Admitting: Hematology and Oncology

## 2016-07-05 ENCOUNTER — Other Ambulatory Visit: Payer: BLUE CROSS/BLUE SHIELD

## 2016-07-11 ENCOUNTER — Ambulatory Visit
Admission: RE | Admit: 2016-07-11 | Discharge: 2016-07-11 | Disposition: A | Discharge status: Home / Self Care | Payer: BLUE CROSS/BLUE SHIELD | Source: Ambulatory Visit | Attending: Hematology and Oncology | Admitting: Hematology and Oncology

## 2016-07-11 DIAGNOSIS — Z1231 Encounter for screening mammogram for malignant neoplasm of breast: Secondary | ICD-10-CM

## 2016-07-13 ENCOUNTER — Encounter: Payer: Self-pay | Admitting: Gynecologic Oncology

## 2016-07-15 ENCOUNTER — Other Ambulatory Visit: Payer: Self-pay | Admitting: Hematology and Oncology

## 2016-07-15 ENCOUNTER — Ambulatory Visit
Admission: RE | Admit: 2016-07-15 | Discharge: 2016-07-15 | Disposition: A | Discharge status: Home / Self Care | Payer: BLUE CROSS/BLUE SHIELD | Source: Ambulatory Visit | Attending: Hematology and Oncology | Admitting: Hematology and Oncology

## 2016-07-15 DIAGNOSIS — Z1501 Genetic susceptibility to malignant neoplasm of breast: Secondary | ICD-10-CM

## 2016-07-15 DIAGNOSIS — Z1509 Genetic susceptibility to other malignant neoplasm: Principal | ICD-10-CM

## 2016-07-15 DIAGNOSIS — R928 Other abnormal and inconclusive findings on diagnostic imaging of breast: Secondary | ICD-10-CM

## 2016-07-15 MED ORDER — GADOBENATE DIMEGLUMINE 529 MG/ML IV SOLN
20.0000 mL | Freq: Once | INTRAVENOUS | Status: AC | PRN
  Administered 2016-07-15: 20 mL via INTRAVENOUS

## 2016-07-18 ENCOUNTER — Ambulatory Visit
Admission: RE | Admit: 2016-07-18 | Discharge: 2016-07-18 | Disposition: A | Discharge status: Home / Self Care | Payer: BLUE CROSS/BLUE SHIELD | Source: Ambulatory Visit | Attending: Hematology and Oncology | Admitting: Hematology and Oncology

## 2016-07-18 DIAGNOSIS — R928 Other abnormal and inconclusive findings on diagnostic imaging of breast: Secondary | ICD-10-CM

## 2016-08-13 ENCOUNTER — Telehealth: Payer: Self-pay | Admitting: *Deleted

## 2016-08-13 NOTE — Telephone Encounter (Signed)
Patient called and moved her appt form Friday July 6th to Monday July 9th. Patient aware of new date/time

## 2016-08-16 ENCOUNTER — Ambulatory Visit: Payer: BLUE CROSS/BLUE SHIELD | Admitting: Gynecologic Oncology

## 2016-08-19 ENCOUNTER — Ambulatory Visit: Payer: BLUE CROSS/BLUE SHIELD

## 2016-08-19 ENCOUNTER — Ambulatory Visit: Payer: BLUE CROSS/BLUE SHIELD | Attending: Gynecologic Oncology | Admitting: Gynecologic Oncology

## 2016-08-19 ENCOUNTER — Encounter: Payer: Self-pay | Admitting: Gynecologic Oncology

## 2016-08-19 VITALS — BP 132/73 | HR 90 | Temp 98.9°F | Resp 20 | Ht 64.25 in | Wt 224.6 lb

## 2016-08-19 DIAGNOSIS — Z1509 Genetic susceptibility to other malignant neoplasm: Principal | ICD-10-CM

## 2016-08-19 DIAGNOSIS — Z7189 Other specified counseling: Secondary | ICD-10-CM | POA: Diagnosis present

## 2016-08-19 DIAGNOSIS — Z803 Family history of malignant neoplasm of breast: Secondary | ICD-10-CM | POA: Diagnosis not present

## 2016-08-19 DIAGNOSIS — Z1502 Genetic susceptibility to malignant neoplasm of ovary: Principal | ICD-10-CM

## 2016-08-19 DIAGNOSIS — Z8041 Family history of malignant neoplasm of ovary: Secondary | ICD-10-CM | POA: Diagnosis not present

## 2016-08-19 DIAGNOSIS — Z1501 Genetic susceptibility to malignant neoplasm of breast: Secondary | ICD-10-CM

## 2016-08-19 DIAGNOSIS — Z6838 Body mass index (BMI) 38.0-38.9, adult: Secondary | ICD-10-CM | POA: Insufficient documentation

## 2016-08-19 NOTE — Progress Notes (Signed)
Consult Note: Gyn-Onc  Consult was requested by Dr. [name] for the evaluation of Jacqueline Adams 56 y.o. female  CC:  Chief Complaint  Patient presents with  . BRCA gene mutation positive in female    Assessment/Plan:  Jacqueline Adams  is a 56 y.o.  year old with a deleterious mutation of BRCA2.  I discussed what BRCA2 gene does (tumor suprression function) and its association with formation of cancers of the breast and ovaries. I discussed that she has a 1 in 4 lifetime risk for the development of ovarian cancer, and given this high risk, and her postmenopausal state, it is our recommendation that she undergoes risk reducing BSO. I discussed that this is the only way to prevent cancer. I discussed that the alternative (6 monthly surveillance exam and CA 125) does not prevent cancer, and has not been proven to diagnosed cancer at an earlier stage and therefore has not been shown to improve survival from ovarian cancer. I discussed that unlike breast cancer, ovarian cancer is usually diagnosed at a very advanced stage and most patients will die from this disease within 3 years of diagnosis.  I discussed that surgery could be accomplished through robotic assisted BSO. I discussed surgical risks including  bleeding, infection, damage to internal organs (such as bladder,ureters, bowels), blood clot, reoperation and rehospitalization. We reviewed that these risks are higher for her due to her morbid obesity. If she were to lose weight prior to surgery it might reduce these risks somewhat.  After hearing this counseling the patient is "not ready" for surgery and is electing for 6 monthly surveillance exams. I will see her annually. We will continue to pursue surgery when she is accepting of this.   HPI: Jacqueline Adams is a 56 year old P2 who is seen in  Consultation at the request of Dr [name] for a deleterious mutation in [place]. The patient has a strong family history,  particularly for breast cancer, though her mother also had ovarian cancer. It is on her maternal side. She was tested for BRCA mutation and was found to be a carrier of a deleterious mutation in BRCA2. She saw her OBGYN, Dr Ruthann Cancer for this, who told her that because she was "not having any problems" she did not need surgery right now.  The patient has a personal history of 2 prior cesarean sections and a TAH for menorrhagia. She is morbidly obese (BMI 38kg/m2) with most of her obesity distributed centrally.   Current Meds:  Outpatient Encounter Prescriptions as of 08/19/2016  Medication Sig  . AVONEX PREFILLED 30 MCG/0.5ML PSKT injection INJECT 30 MCG INTRAMUSCULARLY EVERY 7 DAYS  . cyclobenzaprine (FLEXERIL) 5 MG tablet Take 1 tablet (5 mg total) by mouth 3 (three) times daily as needed for muscle spasms.  . fluconazole (DIFLUCAN) 150 MG tablet   . fluticasone (FLONASE) 50 MCG/ACT nasal spray Place 2 sprays into both nostrils daily.  [name] ibuprofen (ADVIL,MOTRIN) 400 MG tablet Take 400 mg by mouth every 8 (eight) hours as needed.    . Multiple Vitamins-Minerals (CENTRUM SILVER ADULT 50+ PO) Take by mouth.  . simvastatin (ZOCOR) 20 MG tablet Take 1 tablet by mouth daily.  . VENTOLIN HFA 108 (90 BASE) MCG/ACT inhaler Inhale 2 puffs into the lungs every 6 (six) hours as needed.  . [DISCONTINUED] methylPREDNISolone (MEDROL DOSEPAK) 4 MG TBPK tablet Reported on 05/09/2015   Facility-Administered Encounter Medications as of 08/19/2016  Medication  . gadopentetate dimeglumine (MAGNEVIST) injection 20 mL  Allergy:  Allergies  Allergen Reactions  . Novocain [Procaine] Swelling  . Relafen [Nabumetone] Swelling    Social Hx:   Social History   Social History  . Marital status: Married    Spouse name: [name]  . Number of children: 2  . Years of education: College   Occupational History  . 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 Smoking status: Never Smoker  . Smokeless  tobacco: Never Used  . Alcohol use No  . Drug use: No  . Sexual activity: Yes    Birth control/ protection: None, Surgical   Other Topics Concern  . Not on file   Social History Narrative   Patient lives at home with her spouse.   Caffeine Use: 2 sodas daily    Past Surgical Hx:  Past Surgical History:  Procedure Laterality Date  . CESAREAN SECTION     x 3  . PARTIAL HYSTERECTOMY  2006   for uterine fibroid.     Past Medical Hx:  Past Medical History:  Diagnosis Date  . Bronchitis   . Cervical spondylarthritis   . Headache(784.0)   . High cholesterol   . Leukocytosis 2009  . Multiple sclerosis ([place]) 2009   followed by Dr. Erling Cruz  . Rhinitis   . Sinusitis     Past Gynecological History:  S/p hysterectomy for menorrhagia No LMP recorded. Patient has had a hysterectomy.  Family Hx:  Family History  Problem Relation Age of Onset  . Cancer Mother 16       ovarian cancer; now with suspected breast cancer  . Diabetes Mother        also HTN  . Clotting disorder Father   . Breast cancer Maternal Aunt   . Cancer Maternal Grandmother        unknown female reproductive organ cancer  . Breast cancer Maternal Aunt   . Breast cancer Maternal Aunt   . Colon cancer Sister 75       anal cancer (2014)  . Lung cancer Sister 85  . Stroke Sister 76  . Ovarian cancer Sister 65  . BRCA 1/2 Sister     Review of Systems:  Constitutional  Feels well,    ENT Normal appearing ears and nares bilaterally Skin/Breast  No rash, sores, jaundice, itching, dryness Cardiovascular  No chest pain, shortness of breath, or edema  Pulmonary  No cough or wheeze.  Gastro Intestinal  No nausea, vomitting, or diarrhoea. No bright red blood per rectum, no abdominal pain, change in bowel movement, or constipation.  Genito Urinary  No frequency, urgency, dysuria,  Musculo Skeletal  No myalgia, arthralgia, joint swelling or pain  Neurologic  No weakness, numbness, change in gait,   Psychology  No depression, anxiety, insomnia.   Vitals:  Blood pressure 132/73, pulse 90, temperature 98.9 F (37.2 C), temperature source Oral, resp. rate 20, height 5' 4.25" (1.632 m), weight 224 lb 9.6 oz (101.9 kg), SpO2 96 %.  Physical Exam: WD in NAD Neck  Supple NROM, without any enlargements.  Lymph Node Survey No cervical supraclavicular or inguinal adenopathy Cardiovascular  Pulse normal rate, regularity and rhythm. S1 and S2 normal.  Lungs  Clear to auscultation bilateraly, without wheezes/crackles/rhonchi. Good air movement.  Skin  No rash/lesions/breakdown  Psychiatry  Alert and oriented to person, place, and time  Abdomen  Normoactive bowel sounds, abdomen soft, non-tender and obese without evidence of hernia.  Back No CVA tenderness Genito Urinary  Vulva/vagina: Normal external female  genitalia.   No lesions. No discharge or bleeding.  Bladder/urethra:  No lesions or masses, well supported bladder  Vagina: normal  Cervix and uterus surgically absent.  Adnexa: no palpable masses. Rectal  deferred Extremities  No bilateral cyanosis, clubbing or edema.   Donaciano Eva, MD  08/19/2016, 11:25 AM

## 2016-08-19 NOTE — Patient Instructions (Signed)
Plan to have an ultrasound and CA 125 today and in six months.  Dr. Denman George will plan on seeing you in the office in one year or sooner if needed.  Please call closer to the date to schedule your appt for July 2019.    Arrive for your ultrasound with a full bladder (drink 32 ounces one hour before and hold it, do not urinate).    Please call for any questions or concerns.

## 2016-08-20 ENCOUNTER — Telehealth: Payer: Self-pay | Admitting: Gynecologic Oncology

## 2016-08-20 LAB — CA 125: CANCER ANTIGEN (CA) 125: 13.2 U/mL (ref 0.0–38.1)

## 2016-08-20 NOTE — Telephone Encounter (Signed)
Patient informed of CA 125 results.  No concerns voiced. Advised to call for any needs or concerns. 

## 2016-08-27 ENCOUNTER — Ambulatory Visit (HOSPITAL_COMMUNITY)
Admission: RE | Admit: 2016-08-27 | Discharge: 2016-08-27 | Disposition: A | Discharge status: Home / Self Care | Payer: BLUE CROSS/BLUE SHIELD | Source: Ambulatory Visit | Attending: Gynecologic Oncology | Admitting: Gynecologic Oncology

## 2016-08-27 DIAGNOSIS — Z1501 Genetic susceptibility to malignant neoplasm of breast: Secondary | ICD-10-CM

## 2016-08-27 DIAGNOSIS — Z1502 Genetic susceptibility to malignant neoplasm of ovary: Secondary | ICD-10-CM | POA: Diagnosis present

## 2016-08-27 DIAGNOSIS — Z1509 Genetic susceptibility to other malignant neoplasm: Secondary | ICD-10-CM

## 2016-08-27 DIAGNOSIS — N83291 Other ovarian cyst, right side: Secondary | ICD-10-CM | POA: Diagnosis not present

## 2016-08-27 DIAGNOSIS — N83292 Other ovarian cyst, left side: Secondary | ICD-10-CM | POA: Insufficient documentation

## 2016-08-29 ENCOUNTER — Telehealth: Payer: Self-pay | Admitting: Gynecologic Oncology

## 2016-08-29 NOTE — Telephone Encounter (Signed)
Discussed [place] results with patient along with Dr. Serita Grit recommendations for surgical removal of her ovaries due to BRCA or otherwise 6 month follow up.  After discussion, patient would like to proceed with follow up.  No concerns voiced.  Advised to call for any needs or concerns.

## 2016-11-11 ENCOUNTER — Ambulatory Visit (INDEPENDENT_AMBULATORY_CARE_PROVIDER_SITE_OTHER): Payer: BLUE CROSS/BLUE SHIELD | Admitting: Neurology

## 2016-11-11 ENCOUNTER — Encounter: Payer: Self-pay | Admitting: Neurology

## 2016-11-11 VITALS — BP 115/73 | HR 77 | Resp 18 | Ht 64.25 in | Wt 209.5 lb

## 2016-11-11 DIAGNOSIS — R5383 Other fatigue: Secondary | ICD-10-CM | POA: Diagnosis not present

## 2016-11-11 DIAGNOSIS — G35 Multiple sclerosis: Secondary | ICD-10-CM | POA: Diagnosis not present

## 2016-11-11 DIAGNOSIS — M542 Cervicalgia: Secondary | ICD-10-CM | POA: Diagnosis not present

## 2016-11-11 DIAGNOSIS — E559 Vitamin D deficiency, unspecified: Secondary | ICD-10-CM | POA: Diagnosis not present

## 2016-11-11 DIAGNOSIS — G43009 Migraine without aura, not intractable, without status migrainosus: Secondary | ICD-10-CM | POA: Diagnosis not present

## 2016-11-11 DIAGNOSIS — R350 Frequency of micturition: Secondary | ICD-10-CM

## 2016-11-11 DIAGNOSIS — R252 Cramp and spasm: Secondary | ICD-10-CM

## 2016-11-11 MED ORDER — CYCLOBENZAPRINE HCL 5 MG PO TABS: 5.0000 mg | ORAL_TABLET | Freq: Three times a day (TID) | ORAL | 11 refills | Status: DC | PRN

## 2016-11-11 NOTE — Progress Notes (Signed)
[facility]  PATIENT: Jacqueline Adams DOB: 21-Mar-1960  REFERRING DOCTOR OR PCP:  Rolan Lipa  SOURCE: patientand records form [facility]  _________________________________   HISTORICAL  CHIEF COMPLAINT:  Chief Complaint  Patient presents with  . Multiple Sclerosis    Sts. she continues to tolerate Avonex well.  Has lost about 15lbs since last ov.  PCP started her on Topamax for h/a's and this has helped.  Some mild intermittent numbness/tingling in hands and feet since starting Topamax/fim    HISTORY OF PRESENT ILLNESS:  Jacqueline Adams is a 56 yo woman with MS diagnosed in 2009.       Update 11/11/2016:   She feels her MS has been stable. She takes Avonex weekly. She does not have any injection reactions. She does get mild flulike symptoms that night only, helped by NSAIDs.       She is noting no change in her gait. Balance is sometimes off but she could walk a mile without difficulty. She does not need support. She is having fewer of the right sided spasms that she did at the last visit. Flexeril has helped and she tolerates it well. She still has urinary frequency, usually 2-3 times a night. She falls back asleep quickly.. Urinary frequency is less bothersome during the daytime. Vision is fine. She does go to an ophthalmologist regularly because she has borderline glaucoma.  She notes fatigue most days. This is much worse with heat. She has done better the last 2 weeks she sleeps well most nights. Although she has nocturia she quickly falls back asleep. She does not note any significant problems with mood or cognition.  Headaches have done better on Topamax.  She has noted a little bit of numbness and tingling in the hands and toes but this is not bothering her much.   She had MRI of the brain and cervical spine 05/22/2016 were personally reviewed..   The MRI of the brain shows lesion consistent with MS. None of them enhanced. Compared to the  2017 MRI, there were no significant changes. The MRI of the cervical spine did not show any MS lesions.  _____________________________________________ From 05/09/2016  MS:   She feels that she has been stable on Avonex. In 2016 she switch back to Avonex from Tecfidera due to side effects. She has not had any exacerbations.  I have reviewed the MRI of the brain dated 05/14/2015. It shows T2/FLAIR hyperintense foci in the periventricular and juxtacortical white matter, mostly locatedin the left hemisphere. None of the foci appears to be acute. When compared to the MRI from 12/07/2013, there was no interval change.  Headaches:   She is having more pressure like headaches in her face near the sinuses.    These are distinct from her migraines.     Gait/strength/sensation:  She denies problems with her gait or with strength. She is noting spasms of the right arm that will last for about a minute and then relax again. With activities present she is clumsy in the arm. She also has noted this in her legs but it is less troublesome than in the arm.  At times, the right foot locks up and she stumbles.    She has mild right > left foot numbness and tingling, present x years.  Bladder/bowel:    She denies any urinary urgency, frequency or hesitancy.  She has 2-3  x nocturia but quickly falls back asleep.   Frequency is not bad enough for her to  want a medication.     She feels she empties well.   No UTI's    Npo bowel issues  Vision   She feels vision is good and she denies any MS related visual problems.  She has slightly elevate IOP but not bad enough to be called glaucoma and she is followed q 6 months for tonometry.   Eyes often itchy and she uses drops.    Fatigue/sleep:   She has fatigue that is 24/7 and is worse with heat..  She travels some for her job (did a lot in the spring but less for rest of year)  She also is going through menopause and has hot flashes.      She usually sleeps well but is doing worse  with menopause.    Mood/Cognition:   She denies any depression or anxiety.    Last year was tougher with sister's death. She works as a Garment/textile technologist for [employer].    She denies any cognitive difficulty.       Neck Pain:    Neck pain bothers her more some days despite changing her workstation around to have the monitor at eye level.  It bothers her more when she has a headache. She has cervical disc degeneration.     Vit D was low in the past.    MS History:  In 2009, she had the onset of right arm and leg tingling and clumsiness. At the time, she was seeing Dr. Earley Favor for migraines and he performed a nerve conduction study only showing mild carpal tunnel syndrome. Symptoms worsened the next day and she was referred to Dr. Erling Cruz. An MRI of the brain, cervical spine and a lumbar puncture was performed. The brain MRI showed a large left frontoparietal periventricular focus and several other foci. CSF was consistent with multiple sclerosis.  MRI of the cervical spine was normal. She was started on Avonex.   She was switched to Tecfidera in late 2014 as she had one new focus on her 2014 MRI. She switched back to Avonex in 2016.[name]   REVIEW OF SYSTEMS: Constitutional: No fevers, chills, sweats, or change in appetite.   She has fatigue Eyes: No visual changes, double vision, eye pain.   Being followed for borderline high intraocular pressure. Ear, nose and throat: No hearing loss, ear pain/.    Respiratory: No shortness of breath at rest or with exertion.   No wheezes.   She has frequent bronchitis. Cardiovascular: No chest pain, palpitations GastrointestinaI: No nausea, vomiting, diarrhea, abdominal pain, fecal incontinence Genitourinary: No dysuria.  She has urgency and or frequency.  2-3 x  nocturia. Musculoskeletal: No neck pain, back pain Integumentary: No rash, pruritus, skin lesions Neurological: as above Psychiatric: No depression at this time.  No anxiety Endocrine: No palpitations,  diaphoresis, change in appetite, change in weigh or increased thirst Hematologic/Lymphatic: No anemia, purpura, petechiae. Allergic/Immunologic: No itchy/runny eyes, nasal congestion, recent allergic reactions, rashes  ALLERGIES: Allergies  Allergen Reactions  . Novocain [Procaine] Swelling  . Relafen [Nabumetone] Swelling    HOME MEDICATIONS:  Current Outpatient Prescriptions:  .  AVONEX PREFILLED 30 MCG/0.5ML PSKT injection, INJECT 30 MCG INTRAMUSCULARLY EVERY 7 DAYS, Disp: 3 kit, Rfl: 3 .  cyclobenzaprine (FLEXERIL) 5 MG tablet, Take 1 tablet (5 mg total) by mouth 3 (three) times daily as needed for muscle spasms., Disp: 90 tablet, Rfl: 11 .  fluconazole (DIFLUCAN) 150 MG tablet, , Disp: , Rfl:  .  fluticasone (FLONASE) 50 MCG/ACT nasal  spray, Place 2 sprays into both nostrils daily., Disp: 16 g, Rfl: 2 .  ibuprofen (ADVIL,MOTRIN) 400 MG tablet, Take 400 mg by mouth every 8 (eight) hours as needed.  , Disp: , Rfl:  .  Multiple Vitamins-Minerals (CENTRUM SILVER ADULT 50+ PO), Take by mouth., Disp: , Rfl:  .  simvastatin (ZOCOR) 20 MG tablet, Take 1 tablet by mouth daily., Disp: , Rfl:  .  topiramate (TOPAMAX) 25 MG tablet, Take 25 mg by mouth., Disp: , Rfl:  .  VENTOLIN HFA 108 (90 BASE) MCG/ACT inhaler, Inhale 2 puffs into the lungs every 6 (six) hours as needed., Disp: , Rfl:  No current facility-administered medications for this visit.   Facility-Administered Medications Ordered in Other Visits:  .  gadopentetate dimeglumine (MAGNEVIST) injection 20 mL, 20 mL, Intravenous, Once PRN, Sater, Nanine Means, MD  PAST MEDICAL HISTORY: Past Medical History:  Diagnosis Date  . Bronchitis   . Cervical spondylarthritis   . Headache(784.0)   . High cholesterol   . Leukocytosis 2009  . Multiple sclerosis ([place]) 2009   followed by Dr. Erling Cruz  . Rhinitis   . Sinusitis     PAST SURGICAL HISTORY: Past Surgical History:  Procedure Laterality Date  . CESAREAN SECTION     x 3  . PARTIAL  HYSTERECTOMY  2006   for uterine fibroid.     FAMILY HISTORY: Family History  Problem Relation Age of Onset  . Cancer Mother 41       ovarian cancer; now with suspected breast cancer  . Diabetes Mother        also HTN  . Clotting disorder Father   . Breast cancer Maternal Aunt   . Cancer Maternal Grandmother        unknown female reproductive organ cancer  . Breast cancer Maternal Aunt   . Breast cancer Maternal Aunt   . Colon cancer Sister 46       anal cancer (2014)  . Lung cancer Sister 53  . Stroke Sister 57  . Ovarian cancer Sister 61  . BRCA 1/2 Sister     SOCIAL HISTORY:  Social History   Social History  . Marital status: Married    Spouse name: [name]  . Number of children: 2  . Years of education: College   Occupational History  . chemical byer [employer] History Main Topics  . Smoking status: Never Smoker  . Smokeless tobacco: Never Used  . Alcohol use No  . Drug use: No  . Sexual activity: Yes    Birth control/ protection: None, Surgical   Other Topics Concern  . Not on file   Social History Narrative   Patient lives at home with her spouse.   Caffeine Use: 2 sodas daily     PHYSICAL EXAM  Vitals:   11/11/16 0918  BP: 115/73  Pulse: 77  Resp: 18  Weight: 209 lb 8 oz (95 kg)  Height: 5' 4.25" (1.632 m)    Body mass index is 35.68 kg/m.   General: The patient is well-developed and well-nourished and in no acute distress   Neck: The neck is supple .  The neck is nontender.   ROM good   Neurologic Exam  Mental status: The patient is alert and oriented x 3 at the time of the examination. The patient has apparent normal recent and remote memory, with an apparently normal attention span and concentration ability.   Speech is normal.  Cranial nerves: Extraocular movements are full.  Facial strength and sensation is normal. Trapezius strength is normal.  The tongue is midline, and the patient has symmetric elevation of the soft  palate. No obvious hearing deficits are noted.  Motor:  Muscle bulk is normal.   Tone is normal. Strength is  5 / 5 in all 4 extremities.   Sensory: She has normal touch, temperature and vibration sensation in the arms or legs..  Coordination: Cerebellar testing reveals good finger-nose-finger and heel-to-shin bilaterally.  Gait and station: Station is normal.   Gait is normal. Tandem gait is wide.   Romberg is negative.   Reflexes: Deep tendon reflexes are normal and symmetric in the arms. They are increased in the legs with spread at the knees. This is more on the right than the left. There is no ankle clonus.     DIAGNOSTIC DATA (LABS, IMAGING, TESTING) - I reviewed patient records, labs, notes, testing and imaging myself where available.  Lab Results  Component Value Date   WBC 9.1 05/09/2015   HGB 12.2 05/09/2015   HCT 37.6 05/09/2015   MCV 76 (L) 05/09/2015   PLT 306 05/09/2015      Component Value Date/Time   NA 143 05/09/2015 0937   NA 143 11/22/2013 1514   K 4.1 05/09/2015 0937   K 3.1 (L) 11/22/2013 1514   CL 103 05/09/2015 0937   CL 105 02/18/2012 1207   CO2 22 05/09/2015 0937   CO2 25 11/22/2013 1514   GLUCOSE 105 (H) 05/09/2015 0937   GLUCOSE 105 11/22/2013 1514   GLUCOSE 96 02/18/2012 1207   BUN 6 05/09/2015 0937   BUN 6.6 (L) 11/22/2013 1514   CREATININE 0.67 05/09/2015 0937   CREATININE 0.8 11/22/2013 1514   CALCIUM 9.4 05/09/2015 0937   CALCIUM 9.0 11/22/2013 1514   PROT 7.0 05/09/2015 0937   PROT 7.4 11/22/2013 1514   ALBUMIN 4.0 05/09/2015 0937   ALBUMIN 3.3 (L) 11/22/2013 1514   AST 21 05/09/2015 0937   AST 15 11/22/2013 1514   ALT 18 05/09/2015 0937   ALT 13 11/22/2013 1514   ALKPHOS 88 05/09/2015 0937   ALKPHOS 91 11/22/2013 1514   BILITOT 0.2 05/09/2015 0937   BILITOT 0.23 11/22/2013 1514   GFRNONAA 101 05/09/2015 0937   GFRAA 117 05/09/2015 0937       ASSESSMENT AND PLAN  Multiple sclerosis (HCC) - Plan: CBC with  Differential/Platelet, Comprehensive metabolic panel, VITAMIN D 25 Hydroxy (Vit-D Deficiency, Fractures), TSH  Cervical pain  Spasms of the hands or feet  Migraine without aura and without status migrainosus, not intractable  Avitaminosis D - Plan: VITAMIN D 25 Hydroxy (Vit-D Deficiency, Fractures)  Urinary frequency  Other fatigue - Plan: TSH   1.   Continue Avonex for MS. We went over the signs and symptoms of relapses and she should call if she believes she is having one.  Check labwork today. We will also check a vitamin D as it has been low in the past. Check TSH due to fatigue. 2.    Continue Flexeril 5 mg up to tid and Topamax 50 mg 3.   Try to stay active and exercises as tolerated. 4.   She will return to see me in 6 months or sooner if there are new or worsening neurologic symptoms.   Richard A. Felecia Shelling, MD, PhD 70/07/2374, 28:31 AM Certified in Neurology, Clinical Neurophysiology, Sleep Medicine, Pain Medicine and Neuroimaging  [facility] [address] [place], [place] [address] [phone]

## 2016-11-12 ENCOUNTER — Telehealth: Payer: Self-pay | Admitting: *Deleted

## 2016-11-12 LAB — COMPREHENSIVE METABOLIC PANEL
A/G RATIO: 1.4 (ref 1.2–2.2)
ALT: 23 IU/L (ref 0–32)
AST: 27 IU/L (ref 0–40)
Albumin: 4 g/dL (ref 3.5–5.5)
Alkaline Phosphatase: 87 IU/L (ref 39–117)
BUN/Creatinine Ratio: 11 (ref 9–23)
BUN: 7 mg/dL (ref 6–24)
Bilirubin Total: 0.2 mg/dL (ref 0.0–1.2)
CALCIUM: 9.4 mg/dL (ref 8.7–10.2)
CO2: 23 mmol/L (ref 20–29)
Chloride: 107 mmol/L — ABNORMAL HIGH (ref 96–106)
Creatinine, Ser: 0.63 mg/dL (ref 0.57–1.00)
GFR, EST AFRICAN AMERICAN: 118 mL/min/{1.73_m2} (ref >59)
GFR, EST NON AFRICAN AMERICAN: 103 mL/min/{1.73_m2} (ref >59)
Globulin, Total: 2.9 g/dL (ref 1.5–4.5)
Glucose: 92 mg/dL (ref 65–99)
Potassium: 3.8 mmol/L (ref 3.5–5.2)
Sodium: 145 mmol/L — ABNORMAL HIGH (ref 134–144)
TOTAL PROTEIN: 6.9 g/dL (ref 6.0–8.5)

## 2016-11-12 LAB — VITAMIN D 25 HYDROXY (VIT D DEFICIENCY, FRACTURES): Vit D, 25-Hydroxy: 22.3 ng/mL — ABNORMAL LOW (ref 30.0–100.0)

## 2016-11-12 LAB — CBC WITH DIFFERENTIAL/PLATELET
BASOS: 0 %
Basophils Absolute: 0 10*3/uL (ref 0.0–0.2)
EOS (ABSOLUTE): 0.1 10*3/uL (ref 0.0–0.4)
EOS: 1 %
HEMOGLOBIN: 12.5 g/dL (ref 11.1–15.9)
Hematocrit: 39.9 % (ref 34.0–46.6)
IMMATURE GRANS (ABS): 0 10*3/uL (ref 0.0–0.1)
IMMATURE GRANULOCYTES: 0 %
LYMPHS: 30 %
Lymphocytes Absolute: 4.1 10*3/uL — ABNORMAL HIGH (ref 0.7–3.1)
MCH: 24.8 pg — ABNORMAL LOW (ref 26.6–33.0)
MCHC: 31.3 g/dL — ABNORMAL LOW (ref 31.5–35.7)
MCV: 79 fL (ref 79–97)
MONOCYTES: 4 %
Monocytes Absolute: 0.5 10*3/uL (ref 0.1–0.9)
NEUTROS PCT: 65 %
Neutrophils Absolute: 8.9 10*3/uL — ABNORMAL HIGH (ref 1.4–7.0)
PLATELETS: 266 10*3/uL (ref 150–379)
RBC: 5.05 x10E6/uL (ref 3.77–5.28)
RDW: 18.2 % — ABNORMAL HIGH (ref 12.3–15.4)
WBC: 13.6 10*3/uL — ABNORMAL HIGH (ref 3.4–10.8)

## 2016-11-12 LAB — TSH: TSH: 1.7 u[IU]/mL (ref 0.450–4.500)

## 2016-11-12 MED ORDER — VITAMIN D (ERGOCALCIFEROL) 1.25 MG (50000 UNIT) PO CAPS: 50000.0000 [IU] | ORAL_CAPSULE | ORAL | 1 refills | Status: DC

## 2016-11-12 NOTE — Telephone Encounter (Signed)
I have spoken with Jacqueline Adams this afternoon and reviewed lab results with her, and rec. for rx. Vitamin d for 6 mos, then otc Vit. D 5,000iu daily.  She verbalized understanding of same, is agreeable with tx. plan.  [facility]

## 2016-11-12 NOTE — Telephone Encounter (Signed)
-----   Message from Britt Bottom, MD sent at 11/12/2016 10:08 AM EDT ----- Her vitamin D is low. Lites to 50,000 units weekly 6 months.

## 2016-11-25 ENCOUNTER — Other Ambulatory Visit: Payer: Self-pay | Admitting: Neurology

## 2016-11-27 ENCOUNTER — Telehealth: Payer: Self-pay | Admitting: *Deleted

## 2016-11-27 NOTE — Telephone Encounter (Signed)
Avonex PA completed and faxed to [facility] fax# [phone]

## 2016-11-28 NOTE — Telephone Encounter (Signed)
Fax received from [facility].  Avonex approved for dates 11/27/16 thru 11/28/18.  PA# Dianna Rossetti [phone] AS/fim

## 2017-02-12 ENCOUNTER — Ambulatory Visit: Payer: BLUE CROSS/BLUE SHIELD

## 2017-02-19 ENCOUNTER — Ambulatory Visit (HOSPITAL_COMMUNITY): Payer: BLUE CROSS/BLUE SHIELD

## 2017-03-21 ENCOUNTER — Telehealth: Payer: Self-pay

## 2017-03-21 NOTE — Telephone Encounter (Signed)
Spoke with patient concerning upcoming appointment, and will mail letter. Per 2/8 rescheduling.

## 2017-04-21 ENCOUNTER — Other Ambulatory Visit: Payer: Self-pay | Admitting: Neurology

## 2017-05-12 ENCOUNTER — Other Ambulatory Visit: Payer: Self-pay

## 2017-05-12 ENCOUNTER — Ambulatory Visit: Payer: BLUE CROSS/BLUE SHIELD | Admitting: Neurology

## 2017-05-12 ENCOUNTER — Encounter: Payer: Self-pay | Admitting: Neurology

## 2017-05-12 VITALS — BP 123/80 | HR 98 | Resp 18 | Wt 221.0 lb

## 2017-05-12 DIAGNOSIS — G43009 Migraine without aura, not intractable, without status migrainosus: Secondary | ICD-10-CM | POA: Diagnosis not present

## 2017-05-12 DIAGNOSIS — E559 Vitamin D deficiency, unspecified: Secondary | ICD-10-CM | POA: Diagnosis not present

## 2017-05-12 DIAGNOSIS — G35 Multiple sclerosis: Secondary | ICD-10-CM | POA: Diagnosis not present

## 2017-05-12 DIAGNOSIS — R350 Frequency of micturition: Secondary | ICD-10-CM

## 2017-05-12 DIAGNOSIS — R5383 Other fatigue: Secondary | ICD-10-CM

## 2017-05-12 NOTE — Progress Notes (Signed)
[facility]  PATIENT: Jacqueline Adams DOB: 09-11-1960  REFERRING DOCTOR OR PCP:  Rolan Lipa  SOURCE: patientand records form [facility]  _________________________________   HISTORICAL  CHIEF COMPLAINT:  Chief Complaint  Patient presents with  . Multiple Sclerosis    Sts. she continues to tolerate Avonex well.  Sts. h/a's continue to be controlled with Topamax./fim    HISTORY OF PRESENT ILLNESS:  Jacqueline Adams is a 57 yo woman with MS diagnosed in 2009.       Update 05/12/2017:   She remains on Avonex weekly for her multiple sclerosis.  She feels that she has been stable with no exacerbations recently.  She notes no significant injection reactions and gets mild flulike symptoms, helped by NSAIDs,for just one night after her shots.  Gait and balance are stable.  Occasionally she feels a little off balance but does not have any falls.  She is able to climb stairs.  She continues on Flexeril prn for right-sided muscle spasms.    No significant weakness or numbness.  She has some urinary frequency and nocturia but this is not bothering her too much.  No MS related visual problems.  She notes some fatigue that generally is worse in hot weather.  She sleeps well most nights but has nocturia waking her up about twice.  She usually falls back asleep easily.  She has some stress with her mom having health issues and she helps a lot.  This makes her more tired.    She denies any significant problems with mood disturbances.  Cognition is fine.  She also has migraine headaches.  These are doing better on Topamax.   She tolerates it well.   She has some tingling, better now than initially.  Update 11/11/2016:   She feels her MS has been stable. She takes Avonex weekly. She does not have any injection reactions. She does get mild flulike symptoms that night only, helped by NSAIDs.       She is noting no change in her gait. Balance is sometimes off but she could  walk a mile without difficulty. She does not need support. She is having fewer of the right sided spasms that she did at the last visit. Flexeril has helped and she tolerates it well. She still has urinary frequency, usually 2-3 times a night. She falls back asleep quickly.. Urinary frequency is less bothersome during the daytime. Vision is fine. She does go to an ophthalmologist regularly because she has borderline glaucoma.  She notes fatigue most days. This is much worse with heat. She has done better the last 2 weeks she sleeps well most nights. Although she has nocturia she quickly falls back asleep. She does not note any significant problems with mood or cognition.  Headaches have done better on Topamax.  She has noted a little bit of numbness and tingling in the hands and toes but this is not bothering her much.   She had MRI of the brain and cervical spine 05/22/2016 were personally reviewed..   The MRI of the brain shows lesion consistent with MS. None of them enhanced. Compared to the 2017 MRI, there were no significant changes. The MRI of the cervical spine did not show any MS lesions.  _____________________________________________ From 05/09/2016  MS:   She feels that she has been stable on Avonex. In 2016 she switch back to Avonex from Tecfidera due to side effects. She has not had any exacerbations.  I have reviewed the MRI  of the brain dated 05/14/2015. It shows T2/FLAIR hyperintense foci in the periventricular and juxtacortical white matter, mostly locatedin the left hemisphere. None of the foci appears to be acute. When compared to the MRI from 12/07/2013, there was no interval change.  Headaches:   She is having more pressure like headaches in her face near the sinuses.    These are distinct from her migraines.     Gait/strength/sensation:  She denies problems with her gait or with strength. She is noting spasms of the right arm that will last for about a minute and then relax again.  With activities present she is clumsy in the arm. She also has noted this in her legs but it is less troublesome than in the arm.  At times, the right foot locks up and she stumbles.    She has mild right > left foot numbness and tingling, present x years.  Bladder/bowel:    She denies any urinary urgency, frequency or hesitancy.  She has 2-3  x nocturia but quickly falls back asleep.   Frequency is not bad enough for her to want a medication.     She feels she empties well.   No UTI's    Npo bowel issues  Vision   She feels vision is good and she denies any MS related visual problems.  She has slightly elevate IOP but not bad enough to be called glaucoma and she is followed q 6 months for tonometry.   Eyes often itchy and she uses drops.    Fatigue/sleep:   She has fatigue that is 24/7 and is worse with heat..  She travels some for her job (did a lot in the spring but less for rest of year)  She also is going through menopause and has hot flashes.      She usually sleeps well but is doing worse with menopause.    Mood/Cognition:   She denies any depression or anxiety.    Last year was tougher with sister's death. She works as a Garment/textile technologist for [employer].    She denies any cognitive difficulty.       Neck Pain:    Neck pain bothers her more some days despite changing her workstation around to have the monitor at eye level.  It bothers her more when she has a headache. She has cervical disc degeneration.     Vit D was low in the past.    MS History:  In 2009, she had the onset of right arm and leg tingling and clumsiness. At the time, she was seeing Dr. Earley Favor for migraines and he performed a nerve conduction study only showing mild carpal tunnel syndrome. Symptoms worsened the next day and she was referred to Dr. Erling Cruz. An MRI of the brain, cervical spine and a lumbar puncture was performed. The brain MRI showed a large left frontoparietal periventricular focus and several other foci. CSF was  consistent with multiple sclerosis.  MRI of the cervical spine was normal. She was started on Avonex.   She was switched to Tecfidera in late 2014 as she had one new focus on her 2014 MRI. She switched back to Avonex in 2016.[name]   REVIEW OF SYSTEMS: Constitutional: No fevers, chills, sweats, or change in appetite.   She has fatigue Eyes: No visual changes, double vision, eye pain.   Being followed for borderline high intraocular pressure. Ear, nose and throat: No hearing loss, ear pain/.    Respiratory: No shortness of breath  at rest or with exertion.   No wheezes.   She has frequent bronchitis. Cardiovascular: No chest pain, palpitations GastrointestinaI: No nausea, vomiting, diarrhea, abdominal pain, fecal incontinence Genitourinary: No dysuria.  She has urgency and or frequency.  2-3 x  nocturia. Musculoskeletal: No neck pain, back pain Integumentary: No rash, pruritus, skin lesions Neurological: as above Psychiatric: No depression at this time.  No anxiety Endocrine: No palpitations, diaphoresis, change in appetite, change in weigh or increased thirst Hematologic/Lymphatic: No anemia, purpura, petechiae. Allergic/Immunologic: No itchy/runny eyes, nasal congestion, recent allergic reactions, rashes  ALLERGIES: Allergies  Allergen Reactions  . Novocain [Procaine] Swelling  . Relafen [Nabumetone] Swelling    HOME MEDICATIONS:  Current Outpatient Medications:  .  AVONEX PREFILLED 30 MCG/0.5ML PSKT injection, INJECT ONE SYRINGE (30 MCG) INTRAMUSCULARLY ONCE WEEKLY. REFRIGERATE.PROTECT FROM LIGHT. ALLOW TO WARM TO ROOM TEMPERATURE PRIOR TO USE., Disp: 3 kit, Rfl: 3 .  cyclobenzaprine (FLEXERIL) 5 MG tablet, Take 1 tablet (5 mg total) by mouth 3 (three) times daily as needed for muscle spasms., Disp: 90 tablet, Rfl: 11 .  fluconazole (DIFLUCAN) 150 MG tablet, , Disp: , Rfl:  .  fluticasone (FLONASE) 50 MCG/ACT nasal spray, Place 2 sprays into both nostrils daily., Disp: 16 g, Rfl: 2 .   ibuprofen (ADVIL,MOTRIN) 400 MG tablet, Take 400 mg by mouth every 8 (eight) hours as needed.  , Disp: , Rfl:  .  Multiple Vitamins-Minerals (CENTRUM SILVER ADULT 50+ PO), Take by mouth., Disp: , Rfl:  .  simvastatin (ZOCOR) 20 MG tablet, Take 1 tablet by mouth daily., Disp: , Rfl:  .  topiramate (TOPAMAX) 25 MG tablet, Take 25 mg by mouth., Disp: , Rfl:  .  VENTOLIN HFA 108 (90 BASE) MCG/ACT inhaler, Inhale 2 puffs into the lungs every 6 (six) hours as needed., Disp: , Rfl:  .  Vitamin D, Ergocalciferol, (DRISDOL) 50000 units CAPS capsule, Take 1 capsule (50,000 Units total) by mouth every 7 (seven) days. Once done with prescription, take over the counter Vitamin D 5,000iu once daily., Disp: 12 capsule, Rfl: 1 No current facility-administered medications for this visit.   Facility-Administered Medications Ordered in Other Visits:  .  gadopentetate dimeglumine (MAGNEVIST) injection 20 mL, 20 mL, Intravenous, Once PRN, Alaiah Lundy, Nanine Means, MD  PAST MEDICAL HISTORY: Past Medical History:  Diagnosis Date  . Bronchitis   . Cervical spondylarthritis   . Headache(784.0)   . High cholesterol   . Leukocytosis 2009  . Multiple sclerosis ([place]) 2009   followed by Dr. Erling Cruz  . Rhinitis   . Sinusitis     PAST SURGICAL HISTORY: Past Surgical History:  Procedure Laterality Date  . CESAREAN SECTION     x 3  . PARTIAL HYSTERECTOMY  2006   for uterine fibroid.     FAMILY HISTORY: Family History  Problem Relation Age of Onset  . Cancer Mother 72       ovarian cancer; now with suspected breast cancer  . Diabetes Mother        also HTN  . Clotting disorder Father   . Breast cancer Maternal Aunt   . Cancer Maternal Grandmother        unknown female reproductive organ cancer  . Breast cancer Maternal Aunt   . Breast cancer Maternal Aunt   . Colon cancer Sister 59       anal cancer (2014)  . Lung cancer Sister 33  . Stroke Sister 3  . Ovarian cancer Sister 79  . BRCA 1/2 Sister  SOCIAL HISTORY:  Social History   Socioeconomic History  . Marital status: Married    Spouse name: [name]  . Number of children: 2  . Years of education: College  . Highest education level: Not on file  Occupational History  . Occupation: Print production planner: [employer]  Social Needs  . Financial resource strain: Not on file  . Food insecurity:    Worry: Not on file    Inability: Not on file  . Transportation needs:    Medical: Not on file    Non-medical: Not on file  Tobacco Use  . Smoking status: Never Smoker  . Smokeless tobacco: Never Used  Substance and Sexual Activity  . Alcohol use: No  . Drug use: No  . Sexual activity: Yes    Birth control/protection: None, Surgical  Lifestyle  . Physical activity:    Days per week: Not on file    Minutes per session: Not on file  . Stress: Not on file  Relationships  . Social connections:    Talks on phone: Not on file    Gets together: Not on file    Attends religious service: Not on file    Active member of club or organization: Not on file    Attends meetings of clubs or organizations: Not on file    Relationship status: Not on file  . Intimate partner violence:    Fear of current or ex partner: Not on file    Emotionally abused: Not on file    Physically abused: Not on file    Forced sexual activity: Not on file  Other Topics Concern  . Not on file  Social History Narrative   Patient lives at home with her spouse.   Caffeine Use: 2 sodas daily     PHYSICAL EXAM  Vitals:   05/12/17 1138  BP: 123/80  Pulse: 98  Resp: 18  Weight: 221 lb (100.2 kg)  HC: 64.25" (163.2 cm)    Body mass index is 37.64 kg/m.   General: The patient is well-developed and well-nourished and in no acute distress   Neurologic Exam  Mental status: The patient is alert and oriented x 3 at the time of the examination. The patient has apparent normal recent and remote memory, with an apparently normal attention span and  concentration ability.   Speech is normal.  Cranial nerves: Extraocular movements are full.   Trapezius strength is normal.  Facial strength and sensation is normal.  The tongue is midline, and the patient has symmetric elevation of the soft palate. No obvious hearing deficits are noted.  Motor:  Muscle bulk is normal.   Tone is normal. Strength is  5 / 5 in all 4 extremities.   Sensory: Touch, temperature and vibration sensation is normal and symmetric.  Coordination: Cerebellar testing reveals good finger-nose-finger and heel-to-shin bilaterally.  Gait and station: Station is normal.   Gait is normal. Tandem gait is wide.   Romberg is negative.   Reflexes: Deep tendon reflexes are normal and symmetric in the arms. They are increased in the legs with spread at the knees. This is more on the right than the left. There is no ankle clonus.     DIAGNOSTIC DATA (LABS, IMAGING, TESTING) - I reviewed patient records, labs, notes, testing and imaging myself where available.  Lab Results  Component Value Date   WBC 13.6 (H) 11/11/2016   HGB 12.5 11/11/2016   HCT 39.9 11/11/2016   MCV 79  11/11/2016   PLT 266 11/11/2016      Component Value Date/Time   NA 145 (H) 11/11/2016 1000   NA 143 11/22/2013 1514   K 3.8 11/11/2016 1000   K 3.1 (L) 11/22/2013 1514   CL 107 (H) 11/11/2016 1000   CL 105 02/18/2012 1207   CO2 23 11/11/2016 1000   CO2 25 11/22/2013 1514   GLUCOSE 92 11/11/2016 1000   GLUCOSE 105 11/22/2013 1514   GLUCOSE 96 02/18/2012 1207   BUN 7 11/11/2016 1000   BUN 6.6 (L) 11/22/2013 1514   CREATININE 0.63 11/11/2016 1000   CREATININE 0.8 11/22/2013 1514   CALCIUM 9.4 11/11/2016 1000   CALCIUM 9.0 11/22/2013 1514   PROT 6.9 11/11/2016 1000   PROT 7.4 11/22/2013 1514   ALBUMIN 4.0 11/11/2016 1000   ALBUMIN 3.3 (L) 11/22/2013 1514   AST 27 11/11/2016 1000   AST 15 11/22/2013 1514   ALT 23 11/11/2016 1000   ALT 13 11/22/2013 1514   ALKPHOS 87 11/11/2016 1000   ALKPHOS  91 11/22/2013 1514   BILITOT 0.2 11/11/2016 1000   BILITOT 0.23 11/22/2013 1514   GFRNONAA 103 11/11/2016 1000   GFRAA 118 11/11/2016 1000       ASSESSMENT AND PLAN  Multiple sclerosis (HCC)  Avitaminosis D  Urinary frequency  Other fatigue  Migraine without aura and without status migrainosus, not intractable   1.   She will continue Avonex.  On nights that she gives herself a shot, symptoms can be improved with NSAIDs. 2.    Continue Flexeril 5 mg when needed for muscle spasms and Topamax nightly for headaches  3.   Try to stay active and exercises as tolerated. 4.    Take 5000 units daily of over-the-counter vitamin D.   5.  She will return to see me in 6 months or sooner if there are new or worsening neurologic symptoms.   Kiaria Quinnell A. Felecia Shelling, MD, PhD 03/22/2079, 3:88 PM Certified in Neurology, Clinical Neurophysiology, Sleep Medicine, Pain Medicine and Neuroimaging  [facility] [address], [place], [place] [address] [phone]

## 2017-06-06 ENCOUNTER — Ambulatory Visit: Payer: BLUE CROSS/BLUE SHIELD | Admitting: Hematology and Oncology

## 2017-06-17 ENCOUNTER — Inpatient Hospital Stay: Payer: BLUE CROSS/BLUE SHIELD | Admitting: Hematology and Oncology

## 2017-06-17 NOTE — Assessment & Plan Note (Deleted)
BRCA2 mutation positive: Patient plans to get oophorectomy done with Dr. Ruthann Cancer. I will call Dr. Ruthann Cancer and discussed this with him.  Breast cancer surveillance:  1. Mammogram 07/11/2016 is normal,  density category C 2. Breast MRI:  07/15/2016: No evidence of malignancy in bilateral breasts 3. Breast examination 06/17/2017 is normal  Multiple sclerosis: Patient is currently on treatment and appears to be doing better. Brain MRI 05/22/2016: Findings of multiple sclerosis  Patient has not had a GYN appointment She will need annual mammograms and MRIs. Follow-up annually with survivorship clinic

## 2017-06-17 NOTE — Progress Notes (Deleted)
Patient Care Team: Robyne Peers, MD as PCP - General (Family Medicine) Love, Alyson Locket, MD (Neurology) Jerrell Belfast, MD  DIAGNOSIS:  Encounter Diagnosis  Name Primary?  [name] BRCA gene mutation positive in female    CHIEF COMPLIANT: Follow-up of BRCA2 gene mutation  INTERVAL HISTORY: Jacqueline Adams is a 57 year old with above-mentioned history of BRCA2 gene mutation who is currently on surveillance.  She is here today for the high risk breast clinic.  She denies any lumps or nodules in the breast.  She had a mammogram and MRI done in May 2018 which were both normal.  Breast exam was also normal.  She denies any lumps or nodules in the breast.  She has multiple sclerosis for which she is on therapy.  REVIEW OF SYSTEMS:   Constitutional: Denies fevers, chills or abnormal weight loss Eyes: Denies blurriness of vision Ears, nose, mouth, throat, and face: Denies mucositis or sore throat Respiratory: Denies cough, dyspnea or wheezes Cardiovascular: Denies palpitation, chest discomfort Gastrointestinal:  Denies nausea, heartburn or change in bowel habits Skin: Denies abnormal skin rashes Lymphatics: Denies new lymphadenopathy or easy bruising Neurological: Multiple sclerosis Behavioral/Psych: Mood is stable, no new changes  Extremities: No lower extremity edema Breast:  denies any pain or lumps or nodules in either breasts All other systems were reviewed with the patient and are negative.  I have reviewed the past medical history, past surgical history, social history and family history with the patient and they are unchanged from previous note.  ALLERGIES:  is allergic to novocain [procaine] and relafen [nabumetone].  MEDICATIONS:  Current Outpatient Medications  Medication Sig Dispense Refill  . AVONEX PREFILLED 30 MCG/0.5ML PSKT injection INJECT ONE SYRINGE (30 MCG) INTRAMUSCULARLY ONCE WEEKLY. REFRIGERATE.PROTECT FROM LIGHT. ALLOW TO WARM TO ROOM TEMPERATURE PRIOR TO USE.  3 kit 3  . cyclobenzaprine (FLEXERIL) 5 MG tablet Take 1 tablet (5 mg total) by mouth 3 (three) times daily as needed for muscle spasms. 90 tablet 11  . fluconazole (DIFLUCAN) 150 MG tablet     . fluticasone (FLONASE) 50 MCG/ACT nasal spray Place 2 sprays into both nostrils daily. 16 g 2  . ibuprofen (ADVIL,MOTRIN) 400 MG tablet Take 400 mg by mouth every 8 (eight) hours as needed.      . Multiple Vitamins-Minerals (CENTRUM SILVER ADULT 50+ PO) Take by mouth.    . simvastatin (ZOCOR) 20 MG tablet Take 1 tablet by mouth daily.    [name] topiramate (TOPAMAX) 25 MG tablet Take 25 mg by mouth.    . VENTOLIN HFA 108 (90 BASE) MCG/ACT inhaler Inhale 2 puffs into the lungs every 6 (six) hours as needed.    . Vitamin D, Ergocalciferol, (DRISDOL) 50000 units CAPS capsule Take 1 capsule (50,000 Units total) by mouth every 7 (seven) days. Once done with prescription, take over the counter Vitamin D 5,000iu once daily. 12 capsule 1   No current facility-administered medications for this visit.    Facility-Administered Medications Ordered in Other Visits  Medication Dose Route Frequency Provider Last Rate Last Dose  . gadopentetate dimeglumine (MAGNEVIST) injection 20 mL  20 mL Intravenous Once PRN Sater, Nanine Means, MD        PHYSICAL EXAMINATION: ECOG PERFORMANCE STATUS: 1 - Symptomatic but completely ambulatory  There were no vitals filed for this visit. There were no vitals filed for this visit.  GENERAL:alert, no distress and comfortable SKIN: skin color, texture, turgor are normal, no rashes or significant lesions EYES: normal, Conjunctiva are pink and  non-injected, sclera clear OROPHARYNX:no exudate, no erythema and lips, buccal mucosa, and tongue normal  NECK: supple, thyroid normal size, non-tender, without nodularity LYMPH:  no palpable lymphadenopathy in the cervical, axillary or inguinal LUNGS: clear to auscultation and percussion with normal breathing effort HEART: regular rate & rhythm and  no murmurs and no lower extremity edema ABDOMEN:abdomen soft, non-tender and normal bowel sounds MUSCULOSKELETAL:no cyanosis of digits and no clubbing  NEURO: alert & oriented x 3 with fluent speech, no focal motor/sensory deficits EXTREMITIES: No lower extremity edema BREAST: No palpable masses or nodules in either right or left breasts. No palpable axillary supraclavicular or infraclavicular adenopathy no breast tenderness or nipple discharge. (exam performed in the presence of a chaperone)  LABORATORY DATA:  I have reviewed the data as listed CMP Latest Ref Rng & Units 11/11/2016 05/09/2015 11/09/2014  Glucose 65 - 99 mg/dL 92 105(H) -  BUN 6 - 24 mg/dL 7 6 -  Creatinine 0.57 - 1.00 mg/dL 0.63 0.67 -  Sodium 134 - 144 mmol/L 145(H) 143 -  Potassium 3.5 - 5.2 mmol/L 3.8 4.1 -  Chloride 96 - 106 mmol/L 107(H) 103 -  CO2 20 - 29 mmol/L 23 22 -  Calcium 8.7 - 10.2 mg/dL 9.4 9.4 -  Total Protein 6.0 - 8.5 g/dL 6.9 7.0 6.9  Total Bilirubin 0.0 - 1.2 mg/dL 0.2 0.2 <0.2  Alkaline Phos 39 - 117 IU/L 87 88 90  AST 0 - 40 IU/L 27 21 14   ALT 0 - 32 IU/L 23 18 11     Lab Results  Component Value Date   WBC 13.6 (H) 11/11/2016   HGB 12.5 11/11/2016   HCT 39.9 11/11/2016   MCV 79 11/11/2016   PLT 266 11/11/2016   NEUTROABS 8.9 (H) 11/11/2016    ASSESSMENT & PLAN:  BRCA gene mutation positive in female BRCA2 mutation positive: Patient plans to get oophorectomy done with Dr. Ruthann Cancer. I will call Dr. Ruthann Cancer and discussed this with him.  Breast cancer surveillance:  1. Mammogram 07/11/2016 is normal,  density category C 2. Breast MRI:  07/15/2016: No evidence of malignancy in bilateral breasts 3. Breast examination 06/17/2017 is normal  Multiple sclerosis: Patient is currently on treatment and appears to be doing better. Brain MRI 05/22/2016: Findings of multiple sclerosis  Patient has not had a GYN appointment She will need annual mammograms and MRIs. Follow-up annually with  survivorship clinic     No orders of the defined types were placed in this encounter.  The patient has a good understanding of the overall plan. she agrees with it. she will call with any problems that may develop before the next visit here.   Harriette Ohara, MD 06/17/17

## 2017-06-24 ENCOUNTER — Telehealth: Payer: Self-pay | Admitting: Hematology and Oncology

## 2017-06-24 ENCOUNTER — Inpatient Hospital Stay: Payer: BLUE CROSS/BLUE SHIELD | Attending: Hematology and Oncology | Admitting: Hematology and Oncology

## 2017-06-24 DIAGNOSIS — Z1231 Encounter for screening mammogram for malignant neoplasm of breast: Secondary | ICD-10-CM

## 2017-06-24 DIAGNOSIS — Z1502 Genetic susceptibility to malignant neoplasm of ovary: Secondary | ICD-10-CM | POA: Diagnosis not present

## 2017-06-24 DIAGNOSIS — Z1501 Genetic susceptibility to malignant neoplasm of breast: Secondary | ICD-10-CM | POA: Diagnosis not present

## 2017-06-24 DIAGNOSIS — Z1509 Genetic susceptibility to other malignant neoplasm: Secondary | ICD-10-CM | POA: Diagnosis not present

## 2017-06-24 NOTE — Assessment & Plan Note (Signed)
BRCA2 mutation positive: Patient plans to get oophorectomy done with Dr. Ruthann Cancer. I will call Dr. Ruthann Cancer and discussed this with him.  Breast cancer surveillance:  1. Mammogram 05/08/2015 is normal,  Breast MRI: 06/30/2015: No evidence of malignancy in bilateral breasts 2. Breast examination 06/06/2016 is normal  Multiple sclerosis: Patient is currently on treatment and appears to be doing better. Brain MRI 05/22/2016: Findings of multiple sclerosis  Breast cancer surveillance: 1.  Breast exam 06/24/2017: Benign  2. mammogram 07/11/2016: Right breast possible mass warrants evaluation.  On further evaluation it is a benign lymph node 3.  Breast MRI 07/18/2016: No MR evidence of malignancy in bilateral breasts  GYN referral for oophorectomy  Return to clinic in one year for follow-up. She will need annual mammograms and breast MRIs.

## 2017-06-24 NOTE — Progress Notes (Signed)
Patient Care Team: Robyne Peers, MD as PCP - General (Family Medicine) Love, Alyson Locket, MD (Neurology) Jerrell Belfast, MD  DIAGNOSIS:  Encounter Diagnoses  Name Primary?  [name] BRCA gene mutation positive in female   . Encounter for screening mammogram for malignant neoplasm of breast    CHIEF COMPLIANT: Follow-up of BRCA2 mutation  INTERVAL HISTORY: Jacqueline Adams is a 57 year old with above-mentioned history of BRCA2 mutation who is currently on annual surveillance.  She had mammograms ultrasounds and breast MRIs done in 2018.  There was no evidence of malignancy.  She is here for annual follow-up and denies any major pain or discomfort.  She will need to be set up for the mammogram and MRIs as well.  REVIEW OF SYSTEMS:   Constitutional: Denies fevers, chills or abnormal weight loss Eyes: Denies blurriness of vision Ears, nose, mouth, throat, and face: Denies mucositis or sore throat Respiratory: Denies cough, dyspnea or wheezes Cardiovascular: Denies palpitation, chest discomfort Gastrointestinal:  Denies nausea, heartburn or change in bowel habits Skin: Denies abnormal skin rashes Lymphatics: Denies new lymphadenopathy or easy bruising Neurological:Denies numbness, tingling or new weaknesses Behavioral/Psych: Mood is stable, no new changes  Extremities: No lower extremity edema Breast:  denies any pain or lumps or nodules in either breasts All other systems were reviewed with the patient and are negative.  I have reviewed the past medical history, past surgical history, social history and family history with the patient and they are unchanged from previous note.  ALLERGIES:  is allergic to novocain [procaine] and relafen [nabumetone].  MEDICATIONS:  Current Outpatient Medications  Medication Sig Dispense Refill  . AVONEX PREFILLED 30 MCG/0.5ML PSKT injection INJECT ONE SYRINGE (30 MCG) INTRAMUSCULARLY ONCE WEEKLY. REFRIGERATE.PROTECT FROM LIGHT. ALLOW TO WARM TO ROOM  TEMPERATURE PRIOR TO USE. 3 kit 3  . cyclobenzaprine (FLEXERIL) 5 MG tablet Take 1 tablet (5 mg total) by mouth 3 (three) times daily as needed for muscle spasms. 90 tablet 11  . fluconazole (DIFLUCAN) 150 MG tablet     . fluticasone (FLONASE) 50 MCG/ACT nasal spray Place 2 sprays into both nostrils daily. 16 g 2  . ibuprofen (ADVIL,MOTRIN) 400 MG tablet Take 400 mg by mouth every 8 (eight) hours as needed.      . Multiple Vitamins-Minerals (CENTRUM SILVER ADULT 50+ PO) Take by mouth.    . simvastatin (ZOCOR) 20 MG tablet Take 1 tablet by mouth daily.    [name] topiramate (TOPAMAX) 25 MG tablet Take 25 mg by mouth.    . VENTOLIN HFA 108 (90 BASE) MCG/ACT inhaler Inhale 2 puffs into the lungs every 6 (six) hours as needed.    . Vitamin D, Ergocalciferol, (DRISDOL) 50000 units CAPS capsule Take 1 capsule (50,000 Units total) by mouth every 7 (seven) days. Once done with prescription, take over the counter Vitamin D 5,000iu once daily. 12 capsule 1   No current facility-administered medications for this visit.    Facility-Administered Medications Ordered in Other Visits  Medication Dose Route Frequency Provider Last Rate Last Dose  . gadopentetate dimeglumine (MAGNEVIST) injection 20 mL  20 mL Intravenous Once PRN Sater, Nanine Means, MD        PHYSICAL EXAMINATION: ECOG PERFORMANCE STATUS: 1 - Symptomatic but completely ambulatory  Vitals:   06/24/17 0945  BP: 137/77  Pulse: 75  Resp: 19  Temp: 98.4 F (36.9 C)  SpO2: 99%   Filed Weights   06/24/17 0945  Weight: 216 lb 11.2 oz (98.3 kg)  GENERAL:alert, no distress and comfortable SKIN: skin color, texture, turgor are normal, no rashes or significant lesions EYES: normal, Conjunctiva are pink and non-injected, sclera clear OROPHARYNX:no exudate, no erythema and lips, buccal mucosa, and tongue normal  NECK: supple, thyroid normal size, non-tender, without nodularity LYMPH:  no palpable lymphadenopathy in the cervical, axillary or  inguinal LUNGS: clear to auscultation and percussion with normal breathing effort HEART: regular rate & rhythm and no murmurs and no lower extremity edema ABDOMEN:abdomen soft, non-tender and normal bowel sounds MUSCULOSKELETAL:no cyanosis of digits and no clubbing  NEURO: alert & oriented x 3 with fluent speech, no focal motor/sensory deficits EXTREMITIES: No lower extremity edema BREAST: No palpable masses or nodules in either right or left breasts. No palpable axillary supraclavicular or infraclavicular adenopathy no breast tenderness or nipple discharge. (exam performed in the presence of a chaperone)  LABORATORY DATA:  I have reviewed the data as listed CMP Latest Ref Rng & Units 11/11/2016 05/09/2015 11/09/2014  Glucose 65 - 99 mg/dL 92 105(H) -  BUN 6 - 24 mg/dL 7 6 -  Creatinine 0.57 - 1.00 mg/dL 0.63 0.67 -  Sodium 134 - 144 mmol/L 145(H) 143 -  Potassium 3.5 - 5.2 mmol/L 3.8 4.1 -  Chloride 96 - 106 mmol/L 107(H) 103 -  CO2 20 - 29 mmol/L 23 22 -  Calcium 8.7 - 10.2 mg/dL 9.4 9.4 -  Total Protein 6.0 - 8.5 g/dL 6.9 7.0 6.9  Total Bilirubin 0.0 - 1.2 mg/dL 0.2 0.2 <0.2  Alkaline Phos 39 - 117 IU/L 87 88 90  AST 0 - 40 IU/L 27 21 14   ALT 0 - 32 IU/L 23 18 11     Lab Results  Component Value Date   WBC 13.6 (H) 11/11/2016   HGB 12.5 11/11/2016   HCT 39.9 11/11/2016   MCV 79 11/11/2016   PLT 266 11/11/2016   NEUTROABS 8.9 (H) 11/11/2016    ASSESSMENT & PLAN:  BRCA gene mutation positive in female BRCA2 mutation positive: Patient plans to get oophorectomy done with Dr. Ruthann Cancer. I will call Dr. Ruthann Cancer and discussed this with him.  Breast cancer surveillance:  1. Mammogram 05/08/2015 is normal,  Breast MRI: 06/30/2015: No evidence of malignancy in bilateral breasts 2. Breast examination 06/06/2016 is normal  Multiple sclerosis: Patient is currently on treatment and appears to be doing better. Brain MRI 05/22/2016: Findings of multiple sclerosis  Breast cancer  surveillance: 1.  Breast exam 06/24/2017: Benign  2. mammogram 07/11/2016: Right breast possible mass warrants evaluation.  On further evaluation it is a benign lymph node 3.  Breast MRI 07/18/2016: No MR evidence of malignancy in bilateral breasts  GYN referral for oophorectomy: Patient will need to arrange an appointment with her gynecologist.  She plans to get the ovaries removed sometime this year.  She travels a lot for work.  She has son and her daughter ages 41 and 53.  Return to clinic in one year for follow-up. She will need annual mammograms and breast MRIs.   Orders Placed This Encounter  Procedures  . MM DIAG BREAST TOMO BILATERAL    Standing Status:   Future    Standing Expiration Date:   06/25/2018    Order Specific Question:   Reason for Exam (SYMPTOM  OR DIAGNOSIS REQUIRED)    Answer:   BRCA 2 mutation    Order Specific Question:   Is the patient pregnant?    Answer:   No    Order Specific Question:  Preferred imaging location?    Answer:   [facility]  . MR BREAST BILATERAL W WO CONTRAST INC CAD    Standing Status:   Future    Standing Expiration Date:   08/25/2018    Order Specific Question:   If indicated for the ordered procedure, I authorize the administration of contrast media per Radiology protocol    Answer:   Yes    Order Specific Question:   What is the patient's sedation requirement?    Answer:   No Sedation    Order Specific Question:   Does the patient have a pacemaker or implanted devices?    Answer:   No    Order Specific Question:   Radiology Contrast Protocol - do NOT remove file path    Answer:   [email or web address]    Order Specific Question:   Reason for Exam additional comments    Answer:   BRCA 2 mutation    Order Specific Question:   Preferred imaging location?    Answer:   [facility] (table limit-350 lbs)   The patient has a good understanding of the overall plan. she agrees with it. she will call with any  problems that may develop before the next visit here.   Harriette Ohara, MD 06/24/17

## 2017-06-24 NOTE — Telephone Encounter (Signed)
Gave patient AVs and calendar of upcoming may 2020 appointments °

## 2017-07-28 ENCOUNTER — Other Ambulatory Visit: Payer: Self-pay | Admitting: Hematology and Oncology

## 2017-10-30 ENCOUNTER — Other Ambulatory Visit: Payer: Self-pay | Admitting: Family Medicine

## 2017-10-30 ENCOUNTER — Ambulatory Visit
Admission: RE | Admit: 2017-10-30 | Discharge: 2017-10-30 | Disposition: A | Discharge status: Home / Self Care | Payer: BLUE CROSS/BLUE SHIELD | Source: Ambulatory Visit | Attending: Family Medicine | Admitting: Family Medicine

## 2017-10-30 DIAGNOSIS — R0602 Shortness of breath: Secondary | ICD-10-CM

## 2017-10-30 DIAGNOSIS — K5900 Constipation, unspecified: Secondary | ICD-10-CM

## 2017-10-30 DIAGNOSIS — R05 Cough: Secondary | ICD-10-CM

## 2017-10-30 DIAGNOSIS — R059 Cough, unspecified: Secondary | ICD-10-CM

## 2017-11-12 ENCOUNTER — Other Ambulatory Visit: Payer: Self-pay

## 2017-11-12 ENCOUNTER — Ambulatory Visit: Payer: BLUE CROSS/BLUE SHIELD | Admitting: Neurology

## 2017-11-12 ENCOUNTER — Encounter: Payer: Self-pay | Admitting: Neurology

## 2017-11-12 VITALS — Resp 20 | Ht 64.25 in | Wt 221.0 lb

## 2017-11-12 DIAGNOSIS — E559 Vitamin D deficiency, unspecified: Secondary | ICD-10-CM | POA: Diagnosis not present

## 2017-11-12 DIAGNOSIS — M542 Cervicalgia: Secondary | ICD-10-CM | POA: Diagnosis not present

## 2017-11-12 DIAGNOSIS — G35 Multiple sclerosis: Secondary | ICD-10-CM | POA: Diagnosis not present

## 2017-11-12 DIAGNOSIS — Z8701 Personal history of pneumonia (recurrent): Secondary | ICD-10-CM

## 2017-11-12 DIAGNOSIS — G35D Multiple sclerosis, unspecified: Secondary | ICD-10-CM

## 2017-11-12 DIAGNOSIS — G43009 Migraine without aura, not intractable, without status migrainosus: Secondary | ICD-10-CM

## 2017-11-12 DIAGNOSIS — R350 Frequency of micturition: Secondary | ICD-10-CM

## 2017-11-12 DIAGNOSIS — R5383 Other fatigue: Secondary | ICD-10-CM

## 2017-11-12 MED ORDER — CYCLOBENZAPRINE HCL 5 MG PO TABS: 5.0000 mg | ORAL_TABLET | Freq: Three times a day (TID) | ORAL | 11 refills | Status: DC | PRN

## 2017-11-12 NOTE — Progress Notes (Signed)
[facility]  PATIENT: Jacqueline Adams DOB: May 09, 1960  REFERRING DOCTOR OR PCP:  Rolan Lipa  SOURCE: patientand records form [facility]  _________________________________   HISTORICAL  CHIEF COMPLAINT:  Chief Complaint  Patient presents with  . Multiple Sclerosis    Sts. she continues to tolerate Avonex well.  She missed one shot while sick with pneumonia over the last 2-3 wks.Hilton Cork    HISTORY OF PRESENT ILLNESS:   Jacqueline Adams is a 57 y.o. woman with MS diagnosed in 2009.       Update 11/12/2017: She feels the MS is mostly stable and she has no exacerbation.  She is on Avonex as her DMT.    She was recently diagnosed with pneumonia and is on Levaquin (completes today), prednisone and an inhaler.    She feels wiped out and went down to 1/2 days at work.     She also has more trouble with sleep, maintenance issues worse than onset insomnia.        Her gait is doing ok.   The right leg sometimes feels a little weaker.    She has some numbness and tingling in the hands.     She denies any current bladder issues.   Vision is doing well.   She has no corrective lenses.   She denies depression or anxiety.     Cognition is fine.    She has some neck pain.   Flexeril helped with the pain but she is out.  Update 05/12/2017:   She remains on Avonex weekly for her multiple sclerosis.  She feels that she has been stable with no exacerbations recently.  She notes no significant injection reactions and gets mild flulike symptoms, helped by NSAIDs,for just one night after her shots.  Gait and balance are stable.  Occasionally she feels a little off balance but does not have any falls.  She is able to climb stairs.  She continues on Flexeril prn for right-sided muscle spasms.    No significant weakness or numbness.  She has some urinary frequency and nocturia but this is not bothering her too much.  No MS related visual problems.  She notes some fatigue that  generally is worse in hot weather.  She sleeps well most nights but has nocturia waking her up about twice.  She usually falls back asleep easily.  She has some stress with her mom having health issues and she helps a lot.  This makes her more tired.    She denies any significant problems with mood disturbances.  Cognition is fine.  She also has migraine headaches.  These are doing better on Topamax.   She tolerates it well.   She has some tingling, better now than initially.  Update 11/11/2016:   She feels her MS has been stable. She takes Avonex weekly. She does not have any injection reactions. She does get mild flulike symptoms that night only, helped by NSAIDs.       She is noting no change in her gait. Balance is sometimes off but she could walk a mile without difficulty. She does not need support. She is having fewer of the right sided spasms that she did at the last visit. Flexeril has helped and she tolerates it well. She still has urinary frequency, usually 2-3 times a night. She falls back asleep quickly.. Urinary frequency is less bothersome during the daytime. Vision is fine. She does go to an ophthalmologist regularly because she has borderline glaucoma.  She notes fatigue most days. This is much worse with heat. She has done better the last 2 weeks she sleeps well most nights. Although she has nocturia she quickly falls back asleep. She does not note any significant problems with mood or cognition.  Headaches have done better on Topamax.  She has noted a little bit of numbness and tingling in the hands and toes but this is not bothering her much.   She had MRI of the brain and cervical spine 05/22/2016 were personally reviewed..   The MRI of the brain shows lesion consistent with MS. None of them enhanced. Compared to the 2017 MRI, there were no significant changes. The MRI of the cervical spine did not show any MS lesions.  _____________________________________________ From  05/09/2016  MS:   She feels that she has been stable on Avonex. In 2016 she switch back to Avonex from Tecfidera due to side effects. She has not had any exacerbations.  I have reviewed the MRI of the brain dated 05/14/2015. It shows T2/FLAIR hyperintense foci in the periventricular and juxtacortical white matter, mostly locatedin the left hemisphere. None of the foci appears to be acute. When compared to the MRI from 12/07/2013, there was no interval change.  Headaches:   She is having more pressure like headaches in her face near the sinuses.    These are distinct from her migraines.     Gait/strength/sensation:  She denies problems with her gait or with strength. She is noting spasms of the right arm that will last for about a minute and then relax again. With activities present she is clumsy in the arm. She also has noted this in her legs but it is less troublesome than in the arm.  At times, the right foot locks up and she stumbles.    She has mild right > left foot numbness and tingling, present x years.  Bladder/bowel:    She denies any urinary urgency, frequency or hesitancy.  She has 2-3  x nocturia but quickly falls back asleep.   Frequency is not bad enough for her to want a medication.     She feels she empties well.   No UTI's    Npo bowel issues  Vision   She feels vision is good and she denies any MS related visual problems.  She has slightly elevate IOP but not bad enough to be called glaucoma and she is followed q 6 months for tonometry.   Eyes often itchy and she uses drops.    Fatigue/sleep:   She has fatigue that is 24/7 and is worse with heat..  She travels some for her job (did a lot in the spring but less for rest of year)  She also is going through menopause and has hot flashes.      She usually sleeps well but is doing worse with menopause.    Mood/Cognition:   She denies any depression or anxiety.    Last year was tougher with sister's death. She works as a Garment/textile technologist for  [employer].    She denies any cognitive difficulty.       Neck Pain:    Neck pain bothers her more some days despite changing her workstation around to have the monitor at eye level.  It bothers her more when she has a headache. She has cervical disc degeneration.     Vit D was low in the past.    MS History:  In 2009, she had the onset  of right arm and leg tingling and clumsiness. At the time, she was seeing Dr. Earley Favor for migraines and he performed a nerve conduction study only showing mild carpal tunnel syndrome. Symptoms worsened the next day and she was referred to Dr. Erling Cruz. An MRI of the brain, cervical spine and a lumbar puncture was performed. The brain MRI showed a large left frontoparietal periventricular focus and several other foci. CSF was consistent with multiple sclerosis.  MRI of the cervical spine was normal. She was started on Avonex.   She was switched to Tecfidera in late 2014 as she had one new focus on her 2014 MRI. She switched back to Avonex in 2016.[name]   REVIEW OF SYSTEMS: Constitutional: No fevers, chills, sweats, or change in appetite.   She has fatigue Eyes: No visual changes, double vision, eye pain.   Being followed for borderline high intraocular pressure. Ear, nose and throat: No hearing loss, ear pain/.    Respiratory: No shortness of breath at rest or with exertion.   No wheezes.   She has frequent bronchitis. Cardiovascular: No chest pain, palpitations GastrointestinaI: No nausea, vomiting, diarrhea, abdominal pain, fecal incontinence Genitourinary: No dysuria.  She has urgency and or frequency.  2-3 x  nocturia. Musculoskeletal: No neck pain, back pain Integumentary: No rash, pruritus, skin lesions Neurological: as above Psychiatric: No depression at this time.  No anxiety Endocrine: No palpitations, diaphoresis, change in appetite, change in weigh or increased thirst Hematologic/Lymphatic: No anemia, purpura, petechiae. Allergic/Immunologic: No itchy/runny  eyes, nasal congestion, recent allergic reactions, rashes  ALLERGIES: Allergies  Allergen Reactions  . Procaine Swelling  . Relafen [Nabumetone] Swelling    HOME MEDICATIONS:  Current Outpatient Medications:  .  AVONEX PREFILLED 30 MCG/0.5ML PSKT injection, INJECT ONE SYRINGE (30 MCG) INTRAMUSCULARLY ONCE WEEKLY. REFRIGERATE.PROTECT FROM LIGHT. ALLOW TO WARM TO ROOM TEMPERATURE PRIOR TO USE., Disp: 3 kit, Rfl: 3 .  cholecalciferol (VITAMIN D) 1000 units tablet, Take 2,000 Units by mouth daily., Disp: , Rfl:  .  cyclobenzaprine (FLEXERIL) 5 MG tablet, Take 1 tablet (5 mg total) by mouth 3 (three) times daily as needed for muscle spasms., Disp: 90 tablet, Rfl: 11 .  fluconazole (DIFLUCAN) 150 MG tablet, , Disp: , Rfl:  .  fluticasone (FLONASE) 50 MCG/ACT nasal spray, Place 2 sprays into both nostrils daily., Disp: 16 g, Rfl: 2 .  ibuprofen (ADVIL,MOTRIN) 400 MG tablet, Take 400 mg by mouth every 8 (eight) hours as needed.  , Disp: , Rfl:  .  Multiple Vitamins-Minerals (CENTRUM SILVER ADULT 50+ PO), Take by mouth., Disp: , Rfl:  .  simvastatin (ZOCOR) 20 MG tablet, Take 1 tablet by mouth daily., Disp: , Rfl:  .  VENTOLIN HFA 108 (90 BASE) MCG/ACT inhaler, Inhale 2 puffs into the lungs every 6 (six) hours as needed., Disp: , Rfl:  .  topiramate (TOPAMAX) 25 MG tablet, Take 25 mg by mouth., Disp: , Rfl:  No current facility-administered medications for this visit.   Facility-Administered Medications Ordered in Other Visits:  .  gadopentetate dimeglumine (MAGNEVIST) injection 20 mL, 20 mL, Intravenous, Once PRN, Sater, Nanine Means, MD  PAST MEDICAL HISTORY: Past Medical History:  Diagnosis Date  . Bronchitis   . Cervical spondylarthritis   . Headache(784.0)   . High cholesterol   . Leukocytosis 2009  . Multiple sclerosis ([place]) 2009   followed by Dr. Erling Cruz  . Rhinitis   . Sinusitis     PAST SURGICAL HISTORY: Past Surgical History:  Procedure Laterality Date  .  CESAREAN SECTION      x 3  . PARTIAL HYSTERECTOMY  2006   for uterine fibroid.     FAMILY HISTORY: Family History  Problem Relation Age of Onset  . Cancer Mother 59       ovarian cancer; now with suspected breast cancer  . Diabetes Mother        also HTN  . Clotting disorder Father   . Breast cancer Maternal Aunt   . Cancer Maternal Grandmother        unknown female reproductive organ cancer  . Breast cancer Maternal Aunt   . Breast cancer Maternal Aunt   . Colon cancer Sister 78       anal cancer (2014)  . Lung cancer Sister 39  . Stroke Sister 13  . Ovarian cancer Sister 78  . BRCA 1/2 Sister     SOCIAL HISTORY:  Social History   Socioeconomic History  . Marital status: Married    Spouse name: [name]  . Number of children: 2  . Years of education: College  . Highest education level: Not on file  Occupational History  . Occupation: Print production planner: [employer]  Social Needs  . Financial resource strain: Not on file  . Food insecurity:    Worry: Not on file    Inability: Not on file  . Transportation needs:    Medical: Not on file    Non-medical: Not on file  Tobacco Use  . Smoking status: Never Smoker  . Smokeless tobacco: Never Used  Substance and Sexual Activity  . Alcohol use: No  . Drug use: No  . Sexual activity: Yes    Birth control/protection: None, Surgical  Lifestyle  . Physical activity:    Days per week: Not on file    Minutes per session: Not on file  . Stress: Not on file  Relationships  . Social connections:    Talks on phone: Not on file    Gets together: Not on file    Attends religious service: Not on file    Active member of club or organization: Not on file    Attends meetings of clubs or organizations: Not on file    Relationship status: Not on file  . Intimate partner violence:    Fear of current or ex partner: Not on file    Emotionally abused: Not on file    Physically abused: Not on file    Forced sexual activity: Not on file  Other  Topics Concern  . Not on file  Social History Narrative   Patient lives at home with her spouse.   Caffeine Use: 2 sodas daily     PHYSICAL EXAM  Vitals:   11/12/17 1130  Resp: 20  Weight: 221 lb (100.2 kg)  Height: 5' 4.25" (1.632 m)    Body mass index is 37.64 kg/m.   General: The patient is well-developed and well-nourished and in no acute distress   Neurologic Exam  Mental status: The patient is alert and oriented x 3 at the time of the examination. The patient has apparent normal recent and remote memory, with an apparently normal attention span and concentration ability.   Speech is normal.  Cranial nerves: Extraocular movements are full.   Facial strength and sensation was normal.  Trapezius strength was normal.  No obvious hearing deficits are noted.  Motor:  Muscle bulk is normal.   Tone is normal. Strength is  5 / 5 in all 4  extremities.   Sensory: Intact touch sensation in the arms and legs..  Coordination: Cerebellar testing reveals good finger-nose-finger and heel-to-shin bilaterally.  Gait and station: Station is normal.   Gait is normal. Tandem gait is mildly wide.   Romberg is negative.   Reflexes: Deep tendon reflexes are normal and symmetric in the arms. They are increased in the legs with spread at the knees. This is more on the right than the left. There is no ankle clonus.     DIAGNOSTIC DATA (LABS, IMAGING, TESTING) - I reviewed patient records, labs, notes, testing and imaging myself where available.  Lab Results  Component Value Date   WBC 13.6 (H) 11/11/2016   HGB 12.5 11/11/2016   HCT 39.9 11/11/2016   MCV 79 11/11/2016   PLT 266 11/11/2016      Component Value Date/Time   NA 145 (H) 11/11/2016 1000   NA 143 11/22/2013 1514   K 3.8 11/11/2016 1000   K 3.1 (L) 11/22/2013 1514   CL 107 (H) 11/11/2016 1000   CL 105 02/18/2012 1207   CO2 23 11/11/2016 1000   CO2 25 11/22/2013 1514   GLUCOSE 92 11/11/2016 1000   GLUCOSE 105  11/22/2013 1514   GLUCOSE 96 02/18/2012 1207   BUN 7 11/11/2016 1000   BUN 6.6 (L) 11/22/2013 1514   CREATININE 0.63 11/11/2016 1000   CREATININE 0.8 11/22/2013 1514   CALCIUM 9.4 11/11/2016 1000   CALCIUM 9.0 11/22/2013 1514   PROT 6.9 11/11/2016 1000   PROT 7.4 11/22/2013 1514   ALBUMIN 4.0 11/11/2016 1000   ALBUMIN 3.3 (L) 11/22/2013 1514   AST 27 11/11/2016 1000   AST 15 11/22/2013 1514   ALT 23 11/11/2016 1000   ALT 13 11/22/2013 1514   ALKPHOS 87 11/11/2016 1000   ALKPHOS 91 11/22/2013 1514   BILITOT 0.2 11/11/2016 1000   BILITOT 0.23 11/22/2013 1514   GFRNONAA 103 11/11/2016 1000   GFRAA 118 11/11/2016 1000       ASSESSMENT AND PLAN  Multiple sclerosis (HCC) - Plan: CBC with Differential/Platelet, Comprehensive metabolic panel, VITAMIN D 25 Hydroxy (Vit-D Deficiency, Fractures)  Migraine without aura and without status migrainosus, not intractable  Cervical pain  Avitaminosis D - Plan: VITAMIN D 25 Hydroxy (Vit-D Deficiency, Fractures)  Urinary frequency  Other fatigue - Plan: CBC with Differential/Platelet, Comprehensive metabolic panel  History of pneumonia   1.   Continue Avonex.  We will check CBC and CMP today. 2.   Continue Flexeril 5 mg for insomnia and neck pain.  Continue Topamax for headaches.   3.  Try to stay active and exercises as tolerated. 4.  Check vitamin D.  If significantly low she may need a large dose.  Otherwise she can take 5000 units daily OTC..   5.  She will return to see me in 6 months or sooner if there are new or worsening neurologic symptoms.   Richard A. Felecia Shelling, MD, PhD 20/03/3341, 56:86 PM Certified in Neurology, Clinical Neurophysiology, Sleep Medicine, Pain Medicine and Neuroimaging  [facility] [address], [place], [place] [address] [phone]

## 2017-11-13 ENCOUNTER — Telehealth: Payer: Self-pay | Admitting: *Deleted

## 2017-11-13 LAB — COMPREHENSIVE METABOLIC PANEL
A/G RATIO: 1.4 (ref 1.2–2.2)
ALT: 16 IU/L (ref 0–32)
AST: 13 IU/L (ref 0–40)
Albumin: 3.7 g/dL (ref 3.5–5.5)
Alkaline Phosphatase: 89 IU/L (ref 39–117)
BUN / CREAT RATIO: 14 (ref 9–23)
BUN: 9 mg/dL (ref 6–24)
CHLORIDE: 102 mmol/L (ref 96–106)
CO2: 27 mmol/L (ref 20–29)
Calcium: 8.8 mg/dL (ref 8.7–10.2)
Creatinine, Ser: 0.65 mg/dL (ref 0.57–1.00)
GFR calc non Af Amer: 101 mL/min/{1.73_m2} (ref >59)
GFR, EST AFRICAN AMERICAN: 116 mL/min/{1.73_m2} (ref >59)
GLOBULIN, TOTAL: 2.7 g/dL (ref 1.5–4.5)
Glucose: 96 mg/dL (ref 65–99)
POTASSIUM: 3.3 mmol/L — AB (ref 3.5–5.2)
SODIUM: 143 mmol/L (ref 134–144)
TOTAL PROTEIN: 6.4 g/dL (ref 6.0–8.5)

## 2017-11-13 LAB — CBC WITH DIFFERENTIAL/PLATELET
BASOS: 0 %
Basophils Absolute: 0.1 10*3/uL (ref 0.0–0.2)
EOS (ABSOLUTE): 0.1 10*3/uL (ref 0.0–0.4)
Eos: 0 %
Hematocrit: 36.9 % (ref 34.0–46.6)
Hemoglobin: 11.6 g/dL (ref 11.1–15.9)
IMMATURE GRANS (ABS): 0.4 10*3/uL — AB (ref 0.0–0.1)
Immature Granulocytes: 1 %
LYMPHS: 23 %
Lymphocytes Absolute: 6.4 10*3/uL — ABNORMAL HIGH (ref 0.7–3.1)
MCH: 24.8 pg — AB (ref 26.6–33.0)
MCHC: 31.4 g/dL — ABNORMAL LOW (ref 31.5–35.7)
MCV: 79 fL (ref 79–97)
MONOS ABS: 1.6 10*3/uL — AB (ref 0.1–0.9)
Monocytes: 6 %
Neutrophils Absolute: 19.7 10*3/uL — ABNORMAL HIGH (ref 1.4–7.0)
Neutrophils: 70 %
PLATELETS: 322 10*3/uL (ref 150–450)
RBC: 4.68 x10E6/uL (ref 3.77–5.28)
RDW: 16.4 % — ABNORMAL HIGH (ref 12.3–15.4)
WBC: 28.3 10*3/uL (ref 3.4–10.8)

## 2017-11-13 LAB — VITAMIN D 25 HYDROXY (VIT D DEFICIENCY, FRACTURES): Vit D, 25-Hydroxy: 24.3 ng/mL — ABNORMAL LOW (ref 30.0–100.0)

## 2017-11-13 MED ORDER — VITAMIN D (ERGOCALCIFEROL) 1.25 MG (50000 UNIT) PO CAPS: 50000.0000 [IU] | ORAL_CAPSULE | ORAL | 0 refills | Status: DC

## 2017-11-13 NOTE — Telephone Encounter (Signed)
Spoke with Jacqueline Adams and reviewed below lab results, and need for rx. vit. d for 12 wks, then otc vit. d 5,000iu daily. She verbalized understanding of same. Rx. escribed to [facility] per her request/fim

## 2017-11-13 NOTE — Telephone Encounter (Signed)
-----   Message from Britt Bottom, MD sent at 11/13/2017  4:27 PM EDT ----- Her WBC's are high (but she is getting over a pneumonia and was on prednisone) so probably ok... She needs to make sure to follow up ith PCP about her pneumonia at some point.  Vit D is low     Lets do 50000 Units weekly x 12 weeks, then 5000 Units daily OTC

## 2017-11-20 ENCOUNTER — Other Ambulatory Visit: Payer: Self-pay | Admitting: Neurology

## 2017-12-16 ENCOUNTER — Ambulatory Visit: Payer: BLUE CROSS/BLUE SHIELD | Admitting: Internal Medicine

## 2017-12-16 ENCOUNTER — Encounter: Payer: Self-pay | Admitting: Internal Medicine

## 2017-12-16 ENCOUNTER — Institutional Professional Consult (permissible substitution): Payer: BLUE CROSS/BLUE SHIELD | Admitting: Pulmonary Disease

## 2017-12-16 ENCOUNTER — Ambulatory Visit (INDEPENDENT_AMBULATORY_CARE_PROVIDER_SITE_OTHER)
Admission: RE | Admit: 2017-12-16 | Discharge: 2017-12-16 | Disposition: A | Discharge status: Home / Self Care | Payer: BLUE CROSS/BLUE SHIELD | Source: Ambulatory Visit | Attending: Internal Medicine | Admitting: Internal Medicine

## 2017-12-16 DIAGNOSIS — J45991 Cough variant asthma: Secondary | ICD-10-CM | POA: Diagnosis not present

## 2017-12-16 DIAGNOSIS — R05 Cough: Secondary | ICD-10-CM | POA: Diagnosis not present

## 2017-12-16 DIAGNOSIS — R059 Cough, unspecified: Secondary | ICD-10-CM

## 2017-12-16 LAB — NITRIC OXIDE: NITRIC OXIDE: 12

## 2017-12-16 MED ORDER — PANTOPRAZOLE SODIUM 40 MG PO TBEC: DELAYED_RELEASE_TABLET | ORAL | 2 refills | Status: DC

## 2017-12-16 NOTE — Patient Instructions (Addendum)
Protonix 40 mg Take 30- 60 min before your first and last meals of the day   GERD (REFLUX)  is an extremely common cause of respiratory symptoms just like yours , many times with no obvious heartburn at all.    It can be treated with medication, but also with lifestyle changes including elevation of the head of your bed (ideally with 6 inch  bed blocks),  Smoking cessation, avoidance of late meals, excessive alcohol, and avoid fatty foods, chocolate, peppermint, colas, red wine, and acidic juices such as orange juice.  NO MINT OR MENTHOL PRODUCTS SO NO COUGH DROPS   USE SUGARLESS CANDY INSTEAD (Jolley ranchers or Stover's or Life Savers) or even ice chips will also do - the key is to swallow to prevent all throat clearing. NO OIL BASED VITAMINS - use powdered substitutes.    For drainage / throat tickle try take CHLORPHENIRAMINE  4 mg ("Chlortab" at [employer])- take one every 4 hours as needed - available over the counter- may cause drowsiness so start with just a bedtime dose or two and see how you tolerate it before trying in daytime    For cough > Delsym 2 tsp every 12 hours as needed    Please see patient coordinator before you leave today  to schedule sinus CT   Please remember to go to the  x-ray department downstairs in the basement  for your tests - we will call you with the results when they are available.      Please schedule a follow up office visit in 2 weeks, sooner if needed  with all medications /inhalers/ solutions in hand so we can verify exactly what you are taking. This includes all medications from all doctors and over the counters

## 2017-12-16 NOTE — Progress Notes (Signed)
Jacqueline Adams, female    DOB: [birth year],    MRN: [id number]     Brief patient profile:  39 yobf  Never smoker with onset of nasal symptoms early 30s  esp spring cough s wheeze better with shots per sharma 2000-2002   stopped the shots completely around 2006 and still at that point still needed zyrtec daily then started back with same symptoms nasal congestion/ cough  recurred around 2011 eval with Wilburn Cornelia and then more tendency to cough > Ramaswamy eval final ov 3/34/35 dx cyclical upper airway coughing and did ok until recurrent cough since aug 2019 so referred to pulmonary clinic 12/16/2017 by Dr   Ruben Gottron    History of Present Illness  12/16/2017  Pulmonary/ 1st office eval Jacqueline Adams  Chief Complaint  Patient presents with  . Pulmonary Consult    Referred by Dr. Ruben Gottron. Pt had PNA 10/30/17 and then again on 11/27/17.  She c/o cough and chest discomfort. Her cough is prod with clear to yellow sputum. She states chest bothers her when she lies down.   Late aug 2019 "bad sinus infection"  = lots of pain over entire face/ nasty drainage/ low grade fever  rx augmentin > fever resolved as did the pain still some drainage but turened  clear with persistent cough so had cxr 10/30/17 ? Dx  pna (not def by my review) rx levaquin 750 x 10 days then prednisone rx > better but still uncomfortable when lies flat with coughing fits so sleeps  45 degrees / still using albuterol nightly though doesn't think it helps  No obvious day to day or daytime variability or assoc excess/ purulent sputum or mucus plugs or hemoptysis or cp or chest tightness, subjective wheeze or overt   hb symptoms.    Also denies any obvious fluctuation of symptoms with weather or environmental changes or other aggravating or alleviating factors except as outlined above   No unusual exposure hx or h/o childhood pna/ asthma or knowledge of premature birth.  Current Allergies, Complete Past Medical History, Past Surgical History,  Family History, and Social History were reviewed in [facility] record.  ROS  The following are not active complaints unless bolded Hoarseness, sore throat, dysphagia, dental problems, itching, sneezing,  nasal congestion or discharge of excess mucus or purulent secretions, ear ache,   fever, chills, sweats, unintended wt loss or wt gain, classically pleuritic or exertional cp,  orthopnea pnd or arm/hand swelling  or leg swelling, presyncope, palpitations, abdominal pain, anorexia, nausea, vomiting, diarrhea  or change in bowel habits or change in bladder habits, change in stools or change in urine, dysuria, hematuria,  rash, arthralgias, visual complaints, headache, numbness, weakness or ataxia or problems with walking or coordination,  change in mood or  memory.           Past Medical History:  Diagnosis Date  . Bronchitis   . Cervical spondylarthritis   . Headache(784.0)   . High cholesterol   . Leukocytosis 2009  . Multiple sclerosis ([place]) 2009   followed by Dr. Erling Cruz  . Rhinitis   . Sinusitis     Outpatient Medications Prior to Visit  Medication Sig Dispense Refill  . AVONEX PREFILLED 30 MCG/0.5ML PSKT injection INJECT ONE SYRINGE INTRAMUSCULARLY ONCE WEEKLY. REFRIGERATE. PROTECT FROM LIGHT. ALLOW TO WARM TO ROOM TEMPERATURE PRIOR TO USE. 3 kit 3  . cyclobenzaprine (FLEXERIL) 5 MG tablet Take 1 tablet (5 mg total) by mouth 3 (three) times daily as  needed for muscle spasms. 90 tablet 11  . fluticasone (FLONASE) 50 MCG/ACT nasal spray Place 2 sprays into both nostrils daily. 16 g 2  . ibuprofen (ADVIL,MOTRIN) 400 MG tablet Take 400 mg by mouth every 8 (eight) hours as needed.      . Multiple Vitamins-Minerals (CENTRUM SILVER ADULT 50+ PO) Take by mouth.    . simvastatin (ZOCOR) 20 MG tablet Take 1 tablet by mouth daily.    [name] topiramate (TOPAMAX) 25 MG tablet Take 25 mg by mouth.    . VENTOLIN HFA 108 (90 BASE) MCG/ACT inhaler Inhale 2 puffs into the lungs every 6  (six) hours as needed.    . Vitamin D, Ergocalciferol, (DRISDOL) 50000 units CAPS capsule Take 50,000 Units by mouth every 7 (seven) days.    . cholecalciferol (VITAMIN D) 1000 units tablet Take 2,000 Units by mouth daily.    . fluconazole (DIFLUCAN) 150 MG tablet     . Vitamin D, Ergocalciferol, (DRISDOL) 50000 units CAPS capsule Take 1 capsule (50,000 Units total) by mouth every 7 (seven) days. Once done with 12 wk. rx, take otc Vit. D3 5,000iu once daily 12 capsule 0              Objective:     BP 130/82 (BP Location: Left Arm, Cuff Size: Normal)   Pulse 77   Ht 5' 4.25" (1.632 m)   Wt 219 lb (99.3 kg)   SpO2 98%   BMI 37.30 kg/m   SpO2: 98 %  RA   amb  Obese bf with vigorous freq throat clearing       HEENT: nl dentition,  and oropharynx which is pristine . Nl external ear canals without cough reflex - mild/mod  bilateral non-specific turbinate edema    NECK :  without JVD/Nodes/TM/ nl carotid upstrokes bilaterally   LUNGS: no acc muscle use,  Nl contour chest which is clear to A and P bilaterally without cough on insp or exp maneuvers   CV:  RRR  no s3 or murmur or increase in P2, and no edema   ABD:  soft and nontender with nl inspiratory excursion in the supine position. No bruits or organomegaly appreciated, bowel sounds nl  MS:  Nl gait/ ext warm without deformities, calf tenderness, cyanosis or clubbing No obvious joint restrictions   SKIN: warm and dry without lesions    NEURO:  alert, approp, nl sensorium with  no motor or cerebellar deficits apparent.     CXR PA and Lateral:   12/16/2017 :    I personally reviewed images and agree with radiology impression as follows:   No active cardiopulmonary disease.       Assessment   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 UACS FENO 12/16/2017  =   12  - Spirometry 12/16/2017  FEV1 2.1 (92%)  Ratio 84   - sinus CT  Pending   Dr Golden Pop notes reviewed and I agree this syndrome strongly favors UACS over asthma.    Upper airway cough syndrome (previously labeled PNDS),  is so named because it's frequently impossible to sort out how much is  CR/sinusitis with freq throat clearing (which can be related to primary GERD)   vs  causing  secondary (" extra esophageal")  GERD from wide swings in gastric pressure that occur with throat clearing, often  promoting self use of mint and menthol lozenges that reduce the lower esophageal sphincter tone and exacerbate the problem further in a cyclical fashion.   These are the  same pts (now being labeled as having "irritable larynx syndrome" by some cough centers) who not infrequently have a history of having failed to tolerate ace inhibitors,  dry powder inhalers or biphosphonates or report having atypical/extraesophageal reflux symptoms that don't respond to standard doses of PPI  and are easily confused as having aecopd or asthma flares by even experienced allergists/ pulmonologists (myself included).    Of the three most common causes of  Sub-acute / recurrent or chronic cough, only one (GERD)  can actually contribute to/ trigger  the other two (asthma and post nasal drip syndrome)  and perpetuate the cylce of cough.  While not intuitively obvious, many patients with chronic low grade reflux do not cough until there is a primary insult that disturbs the protective epithelial barrier and exposes sensitive nerve endings.   This is typically viral but can due to PNDS and  either may apply here.      >>> The point is that once this occurs, it is difficult to eliminate the cycle  using anything but a maximally effective acid suppression regimen at least in the short run, accompanied by an appropriate diet to address non acid GERD and control / eliminate the pnds with 1st gen H1 blockers per guidelines  And if not better consider trial of gabapentin as I suspect the throat clearing, which she is not aware of, extends well before  the present flare.   Will repeat sinus ct now  rather than subject her to more abx at this point and assured her no evidence of refractory pna or for that matter any obvious pulmonary dz here.    rec return in 2 weeks to regroup with all meds in hand using a trust but verify approach to confirm accurate Medication  Reconciliation The principal here is that until we are certain that the  patients are doing what we've asked, it makes no sense to ask them to do more.         Christinia Gully, MD 12/16/2017

## 2017-12-17 ENCOUNTER — Encounter: Payer: Self-pay | Admitting: Internal Medicine

## 2017-12-17 NOTE — Assessment & Plan Note (Addendum)
FENO 12/16/2017  =   12  - Spirometry 12/16/2017  FEV1 2.1 (92%)  Ratio 84   - sinus CT  Pending   Dr Golden Pop notes reviewed and I agree this syndrome strongly favors UACS over asthma.   Upper airway cough syndrome (previously labeled PNDS),  is so named because it's frequently impossible to sort out how much is  CR/sinusitis with freq throat clearing (which can be related to primary GERD)   vs  causing  secondary (" extra esophageal")  GERD from wide swings in gastric pressure that occur with throat clearing, often  promoting self use of mint and menthol lozenges that reduce the lower esophageal sphincter tone and exacerbate the problem further in a cyclical fashion.   These are the same pts (now being labeled as having "irritable larynx syndrome" by some cough centers) who not infrequently have a history of having failed to tolerate ace inhibitors,  dry powder inhalers or biphosphonates or report having atypical/extraesophageal reflux symptoms that don't respond to standard doses of PPI  and are easily confused as having aecopd or asthma flares by even experienced allergists/ pulmonologists (myself included).    Of the three most common causes of  Sub-acute / recurrent or chronic cough, only one (GERD)  can actually contribute to/ trigger  the other two (asthma and post nasal drip syndrome)  and perpetuate the cylce of cough.  While not intuitively obvious, many patients with chronic low grade reflux do not cough until there is a primary insult that disturbs the protective epithelial barrier and exposes sensitive nerve endings.   This is typically viral but can due to PNDS and  either may apply here.      >>> The point is that once this occurs, it is difficult to eliminate the cycle  using anything but a maximally effective acid suppression regimen at least in the short run, accompanied by an appropriate diet to address non acid GERD and control / eliminate the pnds with 1st gen H1 blockers per  guidelines  And if not better consider trial of gabapentin as I suspect the throat clearing, which she is not aware of, extends well before  the present flare.   Will repeat sinus ct now rather than subject her to more abx at this point and assured her no evidence of refractory pna or for that matter any obvious pulmonary dz here.    rec return in 2 weeks to regroup with all meds in hand using a trust but verify approach to confirm accurate Medication  Reconciliation The principal here is that until we are certain that the  patients are doing what we've asked, it makes no sense to ask them to do more.     Total time devoted to counseling  > 50 % of initial 60 min office visit:  review case with pt/ discussion of options/alternatives/ personally creating written customized instructions  in presence of pt  then going over those specific  Instructions directly with the pt including how to use all of the meds but in particular covering each new medication in detail and the difference between the maintenance= "automatic" meds and the prns using an action plan format for the latter (If this problem/symptom => do that organization reading Left to right).  Please see AVS from this visit for a full list of these instructions which I personally wrote for this pt and  are unique to this visit.

## 2017-12-22 ENCOUNTER — Telehealth: Payer: Self-pay | Admitting: Internal Medicine

## 2017-12-22 NOTE — Telephone Encounter (Signed)
Called and spoke with patient she is requesting results from cxr. MW please advise, thank you.

## 2017-12-22 NOTE — Telephone Encounter (Signed)
Sorry I hit the wrong result button but I had intended to let her know it was nl

## 2017-12-22 NOTE — Telephone Encounter (Signed)
LMTCB

## 2017-12-23 ENCOUNTER — Ambulatory Visit (INDEPENDENT_AMBULATORY_CARE_PROVIDER_SITE_OTHER)
Admission: RE | Admit: 2017-12-23 | Discharge: 2017-12-23 | Disposition: A | Discharge status: Home / Self Care | Payer: BLUE CROSS/BLUE SHIELD | Source: Ambulatory Visit | Attending: Internal Medicine | Admitting: Internal Medicine

## 2017-12-23 ENCOUNTER — Telehealth: Payer: Self-pay

## 2017-12-23 DIAGNOSIS — R059 Cough, unspecified: Secondary | ICD-10-CM

## 2017-12-23 DIAGNOSIS — R05 Cough: Secondary | ICD-10-CM | POA: Diagnosis not present

## 2017-12-23 DIAGNOSIS — J45991 Cough variant asthma: Secondary | ICD-10-CM

## 2017-12-23 NOTE — Telephone Encounter (Signed)
Patient called and stated she was at [facility] for her Sinus CCT but would like a Chest CT as well, states she is already there and would like to have it done today. I informed patient that we would not be able to get it done today as it is already late in the afternoon 4:20pm unless ordered STAT and typically STAT orders are scheduled before the patient leaves the office. Patient voiced understanding.   MW please advise if patient can have Chest CT.

## 2017-12-23 NOTE — Telephone Encounter (Signed)
ATC pt, no answer. Left message for pt to call back.  

## 2017-12-23 NOTE — Telephone Encounter (Signed)
I reviewed her notes and cxr and she may need a ct as part of the work up but it's quite expensive and lots of Radiation so we don't start with this study for that reason and it would have to be approved by her insurance which can be a challenge (typically they have to review my notes and get back to [place])

## 2017-12-23 NOTE — Telephone Encounter (Signed)
Called and spoke with pt letting her know the results of her cxr. Pt expressed understanding. Nothing further needed.

## 2017-12-24 NOTE — Progress Notes (Signed)
Left detailed msg on machine with results ok per DPR

## 2017-12-30 ENCOUNTER — Ambulatory Visit: Payer: BLUE CROSS/BLUE SHIELD | Admitting: Internal Medicine

## 2017-12-30 ENCOUNTER — Encounter: Payer: Self-pay | Admitting: Internal Medicine

## 2017-12-30 ENCOUNTER — Other Ambulatory Visit (INDEPENDENT_AMBULATORY_CARE_PROVIDER_SITE_OTHER): Payer: BLUE CROSS/BLUE SHIELD

## 2017-12-30 ENCOUNTER — Ambulatory Visit (INDEPENDENT_AMBULATORY_CARE_PROVIDER_SITE_OTHER): Payer: BLUE CROSS/BLUE SHIELD | Admitting: Internal Medicine

## 2017-12-30 DIAGNOSIS — J45991 Cough variant asthma: Secondary | ICD-10-CM

## 2017-12-30 DIAGNOSIS — D509 Iron deficiency anemia, unspecified: Secondary | ICD-10-CM | POA: Diagnosis not present

## 2017-12-30 LAB — CBC WITH DIFFERENTIAL/PLATELET
Basophils Absolute: 0 10*3/uL (ref 0.0–0.1)
Basophils Relative: 0.4 % (ref 0.0–3.0)
EOS ABS: 0.2 10*3/uL (ref 0.0–0.7)
EOS PCT: 1.7 % (ref 0.0–5.0)
HCT: 38.6 % (ref 36.0–46.0)
Hemoglobin: 12.3 g/dL (ref 12.0–15.0)
LYMPHS ABS: 4.5 10*3/uL — AB (ref 0.7–4.0)
Lymphocytes Relative: 38.8 % (ref 12.0–46.0)
MCHC: 32 g/dL (ref 30.0–36.0)
MCV: 77.1 fl — ABNORMAL LOW (ref 78.0–100.0)
MONO ABS: 0.7 10*3/uL (ref 0.1–1.0)
Monocytes Relative: 6.1 % (ref 3.0–12.0)
NEUTROS PCT: 53 % (ref 43.0–77.0)
Neutro Abs: 6.1 10*3/uL (ref 1.4–7.7)
Platelets: 287 10*3/uL (ref 150.0–400.0)
RBC: 5.01 Mil/uL (ref 3.87–5.11)
RDW: 17.3 % — ABNORMAL HIGH (ref 11.5–15.5)
WBC: 11.6 10*3/uL — ABNORMAL HIGH (ref 4.0–10.5)

## 2017-12-30 MED ORDER — GABAPENTIN 100 MG PO CAPS: 100.0000 mg | ORAL_CAPSULE | Freq: Three times a day (TID) | ORAL | 2 refills | Status: DC

## 2017-12-30 NOTE — Patient Instructions (Addendum)
Gabapentin 100 mg three times a day   Resume protonix 40 mg Take 30- 60 min before your first and last meals of the day - only wean this off if 100% for a couple of weeks    Call Dr Wilburn Cornelia for follow up   Take flonase twice daily   Please remember to go to the lab department downstairs in the basement  for your tests - we will call you with the results when they are available.      Please schedule a follow up office visit in 6 weeks, call sooner if needed with all medications /inhalers/ solutions in hand so we can verify exactly what you are taking. This includes all medications from all doctors and over the counters  - needs fe panel next ov if not done in meantime

## 2017-12-30 NOTE — Progress Notes (Signed)
Jacqueline Adams, female    DOB: 04-11-1960,    MRN: [id number]     Brief patient profile:  69 yobf  Never smoker with onset of nasal symptoms early 30s  esp spring cough s wheeze better with shots per sharma 2000-2002 variably thereafter but stopped the shots completely around 2006 and still at that point   needed zyrtec daily then same symptoms recurred around 2011  with same symptoms nasal congestion/ cough  eval with Wilburn Cornelia 07/07/2015   and then more tendency to cough > Ramaswamy eval final ov 05/30/60 dx cyclical upper airway coughing and did ok until recurrent cough since aug 2019 so referred to pulmonary clinic 12/16/2017 by Dr   Ruben Gottron    History of Present Illness  12/16/2017  Pulmonary/ 1st office eval Melvyn Novas  Chief Complaint  Patient presents with  . Pulmonary Consult    Referred by Dr. Ruben Gottron. Pt had PNA 10/30/17 and then again on 11/27/17.  She c/o cough and chest discomfort. Her cough is prod with clear to yellow sputum. She states chest bothers her when she lies down.   Late aug 2019 "bad sinus infection"  = lots of pain over entire face/ nasty drainage/ low grade fever  rx augmentin > fever resolved as did the pain still some drainage but turned  clear with persistent cough so had cxr 10/30/17 ? Dx  pna (not def by my review) rx levaquin 750 x 10 days then prednisone rx > better but still uncomfortable when lies flat with coughing fits so sleeps  45 degrees / still using albuterol nightly though doesn't think it helps rec Protonix 40 mg Take 30- 60 min before your first and last meals of the day  GERD diet  For drainage / throat tickle try take CHLORPHENIRAMINE  4 mg ("Chlortab" at [employer])- take one every 4 hours as needed - available over the counter-  For cough > Delsym 2 tsp every 12 hours as needed  Please see patient coordinator before you leave today  to schedule sinus CT > neg  Please schedule a follow up office visit in 2 weeks, sooner if needed  with all medications  /inhalers/ solutions in hand so we can verify exactly what you are taking. This includes all medications from all doctors and over the counters    12/30/2017  f/u ov/Wert re:  Difficulty breathing thru nose/ sensation of pnds/ allegra not helping and still also on zyrtec - sometimes zyrtec d at hs so she can breathe Chief Complaint  Patient presents with  . Follow-up    Breathing has improved some but still has the same chest discomfort and can not lie down comfortably.   Dyspnea:  Nl pace, steps cause some doe  Cough: esp when wakes up in am/ clear mucus= tsp and again when supine so has to sit up  Has twinges of cp started in September assoc act of lying in back in bed most every night x 2 secs never longer Sleeping: eventually able to sleep but 45 degrees x aug 2019 SABA use:  Rarely needs     No obvious day to day or daytime variability or assoc excess/ purulent sputum or mucus plugs or hemoptysis or  chest tightness, subjective wheeze or overt sinus or hb symptoms.     Also denies any obvious fluctuation of symptoms with weather or environmental changes or other aggravating or alleviating factors except as outlined above   No unusual exposure hx or h/o childhood pna/  asthma or knowledge of premature birth.  Current Allergies, Complete Past Medical History, Past Surgical History, Family History, and Social History were reviewed in [employer] [facility] record.  ROS  The following are not active complaints unless bolded Hoarseness, sore throat, dysphagia, dental problems, itching, sneezing,  nasal congestion or discharge of excess mucus or purulent secretions, ear ache,   fever, chills, sweats, unintended wt loss or wt gain, classically pleuritic or exertional cp,  orthopnea pnd or arm/hand swelling  or leg swelling, presyncope, palpitations, abdominal pain, anorexia, nausea, vomiting, diarrhea  or change in bowel habits or change in bladder habits, change in stools or change  in urine, dysuria, hematuria,  rash, arthralgias, visual complaints, headache, numbness, weakness or ataxia or problems with walking or coordination,  change in mood or  memory.        Current Meds  Medication Sig  . AVONEX PREFILLED 30 MCG/0.5ML PSKT injection INJECT ONE SYRINGE INTRAMUSCULARLY ONCE WEEKLY. REFRIGERATE. PROTECT FROM LIGHT. ALLOW TO WARM TO ROOM TEMPERATURE PRIOR TO USE.  . cyclobenzaprine (FLEXERIL) 5 MG tablet Take 1 tablet (5 mg total) by mouth 3 (three) times daily as needed for muscle spasms.  . fluticasone (FLONASE) 50 MCG/ACT nasal spray Place 2 sprays into both nostrils daily.  [name] ibuprofen (ADVIL,MOTRIN) 400 MG tablet Take 400 mg by mouth every 8 (eight) hours as needed.    . Multiple Vitamins-Minerals (CENTRUM SILVER ADULT 50+ PO) Take by mouth.  . pantoprazole (PROTONIX) 40 MG tablet Take 30- 60 min before your first and last meals of the day  . simvastatin (ZOCOR) 20 MG tablet Take 1 tablet by mouth daily.  . VENTOLIN HFA 108 (90 BASE) MCG/ACT inhaler Inhale 2 puffs into the lungs every 6 (six) hours as needed.  . Vitamin D, Ergocalciferol, (DRISDOL) 50000 units CAPS capsule Take 50,000 Units by mouth every 7 (seven) days.           Objective:      amb obese bf nad   Wt Readings from Last 3 Encounters:  12/30/17 222 lb 12.8 oz (101.1 kg)  12/16/17 219 lb (99.3 kg)  11/12/17 221 lb (100.2 kg)     Vital signs reviewed - Note on arrival 02 sats  100% on RA   HEENT: nl dentition,   and oropharynx. Nl external ear canals without cough reflex - severe bilateral non-specific turbinate edema     NECK :  without JVD/Nodes/TM/ nl carotid upstrokes bilaterally   LUNGS: no acc muscle use,  Nl contour chest which is clear to A and P bilaterally without cough on insp or exp maneuvers   CV:  RRR  no s3 or murmur or increase in P2, and no edema   ABD: Obese soft and nontender with nl inspiratory excursion in the supine position. No bruits or organomegaly  appreciated, bowel sounds nl  MS:  Nl gait/ ext warm without deformities, calf tenderness, cyanosis or clubbing No obvious joint restrictions   SKIN: warm and dry without lesions    NEURO:  alert, approp, nl sensorium with  no motor or cerebellar deficits apparent.           Labs ordered 12/30/2017   Allergy profile   Lab Results  Component Value Date   WBC 11.6 (H) 12/30/2017   HGB 12.3 12/30/2017   HCT 38.6 12/30/2017   MCV 77.1 (L) 12/30/2017   PLT 287.0 12/30/2017          Assessment

## 2017-12-31 ENCOUNTER — Encounter: Payer: Self-pay | Admitting: Internal Medicine

## 2017-12-31 DIAGNOSIS — D509 Iron deficiency anemia, unspecified: Secondary | ICD-10-CM | POA: Insufficient documentation

## 2017-12-31 NOTE — Assessment & Plan Note (Signed)
This may represent thal minor but is c/w borderline fe def so will need f/u by either pcp or gyn or here if not done by next f/u ov/ advised

## 2017-12-31 NOTE — Assessment & Plan Note (Addendum)
Onset around 2011 assoc with severe chronic nasal congestion/pnds FENO 12/16/2017  =   12  - Spirometry 12/16/2017  FEV1 2.1 (92%)  Ratio 84   - sinus CT  12/23/17  >>> wnl - Allergy profile 12/30/2017 >  Eos 0.2 /  IgE      - Gabapentin 100 mg tid 12/30/2017  Pending   Still strongly favor here dx of Upper airway cough syndrome (previously labeled PNDS),  is so named because it's frequently impossible to sort out how much is  CR/sinusitis with freq throat clearing (which can be related to primary GERD)   vs  causing  secondary (" extra esophageal")  GERD from wide swings in gastric pressure that occur with throat clearing, often  promoting self use of mint and menthol lozenges that reduce the lower esophageal sphincter tone and exacerbate the problem further in a cyclical fashion.   These are the same pts (now being labeled as having "irritable larynx syndrome" by some cough centers) who not infrequently have a history of having failed to tolerate ace inhibitors,  dry powder inhalers or biphosphonates or report having atypical/extraesophageal reflux symptoms that don't respond to standard doses of PPI  and are easily confused as having aecopd or asthma flares by even experienced allergists/ pulmonologists (myself included).    rec trial of gabapentin for the globus sensation and ent eval for severe refractory nasal obst symptoms noting over use of both antihistamines and decongestants chronically and offer per Dr Wilburn Cornelia to do inferior turbinate reduction in his last note and in meantime increase flonase to bid and discourage use of decongestants as risking rebound.   Discussed in detail all the  indications, usual  risks and alternatives  relative to the benefits with patient who agrees to proceed with w/u as outlined.      I had an extended discussion with the patient reviewing all relevant studies completed to date and  lasting 15 to 20 minutes of a 25 minute visit    Each maintenance  medication was reviewed in detail including most importantly the difference between maintenance and prns and under what circumstances the prns are to be triggered using an action plan format that is not reflected in the computer generated alphabetically organized AVS.     Please see AVS for specific instructions unique to this visit that I personally wrote and verbalized to the the pt in detail and then reviewed with pt  by my nurse highlighting any  changes in therapy recommended at today's visit to their plan of care.

## 2017-12-31 NOTE — Progress Notes (Signed)
LMTCB

## 2017-12-31 NOTE — Telephone Encounter (Signed)
Patient returned phone call, made aware of lab results. Order placed for repeat labs. Patient aware of new address. Nothing further needed at this time.

## 2018-01-01 LAB — RESPIRATORY ALLERGY PROFILE REGION II ~~LOC~~
Allergen, D pternoyssinus,d7: 0.31 kU/L — ABNORMAL HIGH
Allergen, Mulberry, t76: <0.1 kU/L
Allergen, Oak,t7: <0.1 kU/L
Allergen, P. notatum, m1: <0.1 kU/L
BERMUDA GRASS: 0.26 kU/L — AB
Box Elder IgE: <0.1 kU/L
CLADOSPORIUM HERBARUM (M2) IGE: <0.1 kU/L
CLASS: 0
CLASS: 0
CLASS: 0
CLASS: 0
CLASS: 0
CLASS: 0
CLASS: 0
CLASS: 1
COMMON RAGWEED (SHORT) (W1) IGE: <0.1 kU/L
Cat Dander: <0.1 kU/L
Class: 0
Class: 0
Class: 0
Class: 0
Class: 0
Class: 0
Class: 0
Class: 0
Class: 0
Class: 0
Class: 0
Class: 0
Class: 0
Class: 0
Class: 0
Class: 2
Cockroach: <0.1 kU/L
D. farinae: 0.27 kU/L — ABNORMAL HIGH
Elm IgE: <0.1 kU/L
IgE (Immunoglobulin E), Serum: 64 kU/L (ref <=114)
Johnson Grass: 0.68 kU/L — ABNORMAL HIGH
Pecan/Hickory Tree IgE: 0.29 kU/L — ABNORMAL HIGH
Sheep Sorrel IgE: <0.1 kU/L
Timothy Grass: 2.63 kU/L — ABNORMAL HIGH

## 2018-01-01 LAB — INTERPRETATION:

## 2018-01-02 ENCOUNTER — Telehealth: Payer: Self-pay | Admitting: Internal Medicine

## 2018-01-02 NOTE — Telephone Encounter (Addendum)
Tanda Rockers, MD  Rosana Berger, CMA        Call patient : Studies are c.w mild allergies to trees/ grass / dust. Since her symptoms are mostly year round instead of just in spring the dust is probably the most impt > avoidance only rec  Be sure patient has/keeps f/u ov so we can go over all the details of this study and get a plan together moving forward - ok to move up f/u if not feeling better and wants to be seen sooner    Pt is aware of results and voiced her understanding.  Pt has pending OV for 02/10/18. Nothing further is needed.

## 2018-01-02 NOTE — Progress Notes (Signed)
LMTCB

## 2018-01-30 ENCOUNTER — Other Ambulatory Visit: Payer: Self-pay | Admitting: Neurology

## 2018-02-02 ENCOUNTER — Telehealth: Payer: Self-pay | Admitting: *Deleted

## 2018-02-02 NOTE — Telephone Encounter (Signed)
Received [facility] from [facility] that rx Avonex shipped to pt 01/26/18.

## 2018-02-09 NOTE — Telephone Encounter (Signed)
Patient calling to discuss options for getting medication AVONEX PREFILLED 30 MCG/0.5ML PSKT injection. She says will need a PA starting in January.

## 2018-02-09 NOTE — Telephone Encounter (Signed)
Called pt. Advised we will work on PA for new year once we receive request. No need to change therapy. She verbalized understanding.

## 2018-02-10 ENCOUNTER — Ambulatory Visit: Payer: BLUE CROSS/BLUE SHIELD | Admitting: Internal Medicine

## 2018-02-12 NOTE — Telephone Encounter (Signed)
Called [facility] at [phone] and spoke with [facility]. She will fax me PA form to complete. Waiting on this form.   Pt has been on Avonex since 11/09/14 and tolerating well. Dx: G35.

## 2018-02-12 NOTE — Telephone Encounter (Signed)
Faxed completed/signed form to [facility] at [phone]. Received fax confirmation. Waiting on determination.

## 2018-02-13 ENCOUNTER — Encounter: Payer: Self-pay | Admitting: *Deleted

## 2018-02-13 NOTE — Telephone Encounter (Signed)
PA denied. Submitted urgent appeal to [facility] specialty appeals department. Fax: [phone]. Received fax confirmation. Waiting on determination.

## 2018-02-17 NOTE — Telephone Encounter (Signed)
Appeal letter denied. Pt must try/fail rebif plus two additional formulary alternatives which include: aubagio, betaseron, copaxone, Gilenya, glatiramer, glatopa, Mayzent, Tecfidera, and Tysabri).  I spoke with Dr. Felecia Shelling. He would like to bring pt in for appt to discuss changing medication. I called pt and scheduled appt for 02/19/2018 at 830am. Pt verbalized understanding and appreciation for call.

## 2018-02-19 ENCOUNTER — Encounter: Payer: Self-pay | Admitting: Neurology

## 2018-02-19 ENCOUNTER — Ambulatory Visit: Payer: BLUE CROSS/BLUE SHIELD | Admitting: Neurology

## 2018-02-19 VITALS — BP 126/79 | HR 73 | Wt 223.0 lb

## 2018-02-19 DIAGNOSIS — G35 Multiple sclerosis: Secondary | ICD-10-CM

## 2018-02-19 DIAGNOSIS — R350 Frequency of micturition: Secondary | ICD-10-CM

## 2018-02-19 DIAGNOSIS — Z79899 Other long term (current) drug therapy: Secondary | ICD-10-CM

## 2018-02-19 DIAGNOSIS — R69 Illness, unspecified: Secondary | ICD-10-CM | POA: Diagnosis not present

## 2018-02-19 DIAGNOSIS — R5383 Other fatigue: Secondary | ICD-10-CM

## 2018-02-19 DIAGNOSIS — R200 Anesthesia of skin: Secondary | ICD-10-CM

## 2018-02-19 NOTE — Progress Notes (Signed)
[facility]  PATIENT: Jacqueline Adams DOB: [birth year]  REFERRING DOCTOR OR PCP:  Rolan Lipa  SOURCE: patientand records form [facility]  _________________________________   HISTORICAL  CHIEF COMPLAINT:  Chief Complaint  Patient presents with  . Follow-up    RM 12, alone. Avonex no longer covered by insurnace plan 2020. PA and appeal letter were denied. Here to discuss switching DMT. Preferred: Aubagio, Betaseron, copaxone, Gilenya, glatiramer, Glatopa, Mayzent, Tecfidera, Tysabri.    HISTORY OF PRESENT ILLNESS:   Jacqueline Adams is a 58 y.o. woman with MS diagnosed in 2009.       Update 02/19/2018: She feels her MS is stable.   No recent exacerbation.   She tolerates it well but her insurance refuses to cover Avonex (we appealed).   The list of preferred agents are: Aubagio, Betaseron, copaxone, Gilenya, glatiramer, Glatopa, Mayzent, Tecfidera, Tysabri.     She had tried and failed Tecfidera in the past due to stomach upset.      Gait is fine.   She notes tingling in hr hands,       Bladder is doing well.   Fatigue is her main issue.   She has a severe sprain of the left ankle and is wearing a boot.    She is most interested in either Mayzent or Aubagio and we went over pros and cons of each.    She had an eye exam in November (goes every 6 months as she has borderline glaucoma) and was told everything was fine.     Update 11/12/2017: She feels the MS is mostly stable and she has no exacerbation.  She is on Avonex as her DMT.    She was recently diagnosed with pneumonia and is on Levaquin (completes today), prednisone and an inhaler.    She feels wiped out and went down to 1/2 days at work.     She also has more trouble with sleep, maintenance issues worse than onset insomnia.        Her gait is doing ok.   The right leg sometimes feels a little weaker.    She has some numbness and tingling in the hands.     She denies any current bladder  issues.   Vision is doing well.   She has no corrective lenses.   She denies depression or anxiety.     Cognition is fine.    She has some neck pain.   Flexeril helped with the pain but she is out.  Update 05/12/2017:   She remains on Avonex weekly for her multiple sclerosis.  She feels that she has been stable with no exacerbations recently.  She notes no significant injection reactions and gets mild flulike symptoms, helped by NSAIDs,for just one night after her shots.  Gait and balance are stable.  Occasionally she feels a little off balance but does not have any falls.  She is able to climb stairs.  She continues on Flexeril prn for right-sided muscle spasms.    No significant weakness or numbness.  She has some urinary frequency and nocturia but this is not bothering her too much.  No MS related visual problems.  She notes some fatigue that generally is worse in hot weather.  She sleeps well most nights but has nocturia waking her up about twice.  She usually falls back asleep easily.  She has some stress with her mom having health issues and she helps a lot.  This makes her more tired.  She denies any significant problems with mood disturbances.  Cognition is fine.  She also has migraine headaches.  These are doing better on Topamax.   She tolerates it well.   She has some tingling, better now than initially.  Update 11/11/2016:   She feels her MS has been stable. She takes Avonex weekly. She does not have any injection reactions. She does get mild flulike symptoms that night only, helped by NSAIDs.       She is noting no change in her gait. Balance is sometimes off but she could walk a mile without difficulty. She does not need support. She is having fewer of the right sided spasms that she did at the last visit. Flexeril has helped and she tolerates it well. She still has urinary frequency, usually 2-3 times a night. She falls back asleep quickly.. Urinary frequency is less bothersome during the  daytime. Vision is fine. She does go to an ophthalmologist regularly because she has borderline glaucoma.  She notes fatigue most days. This is much worse with heat. She has done better the last 2 weeks she sleeps well most nights. Although she has nocturia she quickly falls back asleep. She does not note any significant problems with mood or cognition.  Headaches have done better on Topamax.  She has noted a little bit of numbness and tingling in the hands and toes but this is not bothering her much.   She had MRI of the brain and cervical spine 05/22/2016 were personally reviewed..   The MRI of the brain shows lesion consistent with MS. None of them enhanced. Compared to the 2017 MRI, there were no significant changes. The MRI of the cervical spine did not show any MS lesions.  _____________________________________________ From 05/09/2016  MS:   She feels that she has been stable on Avonex. In 2016 she switch back to Avonex from Tecfidera due to side effects. She has not had any exacerbations.  I have reviewed the MRI of the brain dated 05/14/2015. It shows T2/FLAIR hyperintense foci in the periventricular and juxtacortical white matter, mostly locatedin the left hemisphere. None of the foci appears to be acute. When compared to the MRI from 12/07/2013, there was no interval change.  Headaches:   She is having more pressure like headaches in her face near the sinuses.    These are distinct from her migraines.     Gait/strength/sensation:  She denies problems with her gait or with strength. She is noting spasms of the right arm that will last for about a minute and then relax again. With activities present she is clumsy in the arm. She also has noted this in her legs but it is less troublesome than in the arm.  At times, the right foot locks up and she stumbles.    She has mild right > left foot numbness and tingling, present x years.  Bladder/bowel:    She denies any urinary urgency, frequency or  hesitancy.  She has 2-3  x nocturia but quickly falls back asleep.   Frequency is not bad enough for her to want a medication.     She feels she empties well.   No UTI's    Npo bowel issues  Vision   She feels vision is good and she denies any MS related visual problems.  She has slightly elevate IOP but not bad enough to be called glaucoma and she is followed q 6 months for tonometry.   Eyes often itchy and she  uses drops.    Fatigue/sleep:   She has fatigue that is 24/7 and is worse with heat..  She travels some for her job (did a lot in the spring but less for rest of year)  She also is going through menopause and has hot flashes.      She usually sleeps well but is doing worse with menopause.    Mood/Cognition:   She denies any depression or anxiety.    Last year was tougher with sister's death. She works as a Garment/textile technologist for [employer].    She denies any cognitive difficulty.       Neck Pain:    Neck pain bothers her more some days despite changing her workstation around to have the monitor at eye level.  It bothers her more when she has a headache. She has cervical disc degeneration.     Vit D was low in the past.    MS History:  In 2009, she had the onset of right arm and leg tingling and clumsiness. At the time, she was seeing Dr. Earley Favor for migraines and he performed a nerve conduction study only showing mild carpal tunnel syndrome. Symptoms worsened the next day and she was referred to Dr. Erling Cruz. An MRI of the brain, cervical spine and a lumbar puncture was performed. The brain MRI showed a large left frontoparietal periventricular focus and several other foci. CSF was consistent with multiple sclerosis.  MRI of the cervical spine was normal. She was started on Avonex.   She was switched to Tecfidera in late 2014 as she had one new focus on her 2014 MRI. She switched back to Avonex in 2016.[name]   REVIEW OF SYSTEMS: Constitutional: No fevers, chills, sweats, or change in appetite.   She has  fatigue Eyes: No visual changes, double vision, eye pain.   Being followed for borderline high intraocular pressure. Ear, nose and throat: No hearing loss, ear pain/.    Respiratory: No shortness of breath at rest or with exertion.   No wheezes.   She has frequent bronchitis. Cardiovascular: No chest pain, palpitations GastrointestinaI: No nausea, vomiting, diarrhea, abdominal pain, fecal incontinence Genitourinary: No dysuria.  She has urgency and or frequency.  2-3 x  nocturia. Musculoskeletal: No neck pain, back pain Integumentary: No rash, pruritus, skin lesions Neurological: as above Psychiatric: No depression at this time.  No anxiety Endocrine: No palpitations, diaphoresis, change in appetite, change in weigh or increased thirst Hematologic/Lymphatic: No anemia, purpura, petechiae. Allergic/Immunologic: No itchy/runny eyes, nasal congestion, recent allergic reactions, rashes  ALLERGIES: Allergies  Allergen Reactions  . Procaine Swelling  . Relafen [Nabumetone] Swelling    HOME MEDICATIONS:  Current Outpatient Medications:  .  AVONEX PREFILLED 30 MCG/0.5ML PSKT injection, INJECT ONE SYRINGE INTRAMUSCULARLY ONCE WEEKLY. REFRIGERATE. PROTECT FROM LIGHT. ALLOW TO WARM TO ROOM TEMPERATURE PRIOR TO USE., Disp: 3 kit, Rfl: 3 .  cyclobenzaprine (FLEXERIL) 5 MG tablet, Take 1 tablet (5 mg total) by mouth 3 (three) times daily as needed for muscle spasms., Disp: 90 tablet, Rfl: 11 .  fluticasone (FLONASE) 50 MCG/ACT nasal spray, Place 2 sprays into both nostrils daily., Disp: 16 g, Rfl: 2 .  ibuprofen (ADVIL,MOTRIN) 400 MG tablet, Take 400 mg by mouth every 8 (eight) hours as needed.  , Disp: , Rfl:  .  Multiple Vitamins-Minerals (CENTRUM SILVER ADULT 50+ PO), Take by mouth., Disp: , Rfl:  .  pantoprazole (PROTONIX) 40 MG tablet, Take 30- 60 min before your first and last meals of  the day, Disp: 60 tablet, Rfl: 2 .  simvastatin (ZOCOR) 20 MG tablet, Take 1 tablet by mouth daily.,  Disp: , Rfl:  .  VENTOLIN HFA 108 (90 BASE) MCG/ACT inhaler, Inhale 2 puffs into the lungs every 6 (six) hours as needed., Disp: , Rfl:  .  VITAMIN D PO, Take 5,000 Units by mouth daily., Disp: , Rfl:  .  topiramate (TOPAMAX) 25 MG tablet, Take 25 mg by mouth., Disp: , Rfl:  No current facility-administered medications for this visit.   Facility-Administered Medications Ordered in Other Visits:  .  gadopentetate dimeglumine (MAGNEVIST) injection 20 mL, 20 mL, Intravenous, Once PRN, Jayin Derousse, Nanine Means, MD  PAST MEDICAL HISTORY: Past Medical History:  Diagnosis Date  . Bronchitis   . Cervical spondylarthritis   . Headache(784.0)   . High cholesterol   . Leukocytosis 2009  . Multiple sclerosis ([place]) 2009   followed by Dr. Erling Cruz  . Rhinitis   . Sinusitis     PAST SURGICAL HISTORY: Past Surgical History:  Procedure Laterality Date  . CESAREAN SECTION     x 3  . PARTIAL HYSTERECTOMY  2006   for uterine fibroid.     FAMILY HISTORY: Family History  Problem Relation Age of Onset  . Cancer Mother 73       ovarian cancer; now with suspected breast cancer  . Diabetes Mother        also HTN  . Clotting disorder Father   . Breast cancer Maternal Aunt   . Cancer Maternal Grandmother        unknown female reproductive organ cancer  . Breast cancer Maternal Aunt   . Breast cancer Maternal Aunt   . Colon cancer Sister 58       anal cancer (2014)  . Lung cancer Sister 25  . Stroke Sister 70  . Ovarian cancer Sister 64  . BRCA 1/2 Sister     SOCIAL HISTORY:  Social History   Socioeconomic History  . Marital status: Married    Spouse name: [name]  . Number of children: 2  . Years of education: College  . Highest education level: Not on file  Occupational History  . Occupation: Print production planner: [employer]  Social Needs  . Financial resource strain: Not on file  . Food insecurity:    Worry: Not on file    Inability: Not on file  . Transportation needs:    Medical:  Not on file    Non-medical: Not on file  Tobacco Use  . Smoking status: Never Smoker  . Smokeless tobacco: Never Used  Substance and Sexual Activity  . Alcohol use: No  . Drug use: No  . Sexual activity: Yes    Birth control/protection: None, Surgical  Lifestyle  . Physical activity:    Days per week: Not on file    Minutes per session: Not on file  . Stress: Not on file  Relationships  . Social connections:    Talks on phone: Not on file    Gets together: Not on file    Attends religious service: Not on file    Active member of club or organization: Not on file    Attends meetings of clubs or organizations: Not on file    Relationship status: Not on file  . Intimate partner violence:    Fear of current or ex partner: Not on file    Emotionally abused: Not on file    Physically abused: Not on file  Forced sexual activity: Not on file  Other Topics Concern  . Not on file  Social History Narrative   Patient lives at home with her spouse.   Caffeine Use: 2 sodas daily     PHYSICAL EXAM  Vitals:   02/19/18 0833  BP: 126/79  Pulse: 73  Weight: 223 lb (101.2 kg)    Body mass index is 38.28 kg/m.   General: The patient is well-developed and well-nourished and in no acute distress   Neurologic Exam  Mental status: The patient is alert and oriented x 3 at the time of the examination. The patient has apparent normal recent and remote memory, with an apparently normal attention span and concentration ability.   Speech is normal.  Cranial nerves: Extraocular movements are full.   Facial strength is normal.  Trapezius strength is normal.  No obvious hearing deficits are noted.  Motor:  Muscle bulk is normal.   Tone is normal. Strength is  5 / 5 in all 4 extremities.   Sensory: Intact touch sensation in the arms and legs..  Coordination: Cerebellar testing reveals good finger-nose-finger and heel-to-shin bilaterally.  Gait and station: Station is normal.   The gait  is normal and the tandem gait is mildly wide.  Romberg is negative.   Reflexes: Deep tendon reflexes are normal and symmetric in the arms.  Deep tendon reflexes are increased at the knees, right greater than left.  There is no ankle clonus.   DIAGNOSTIC DATA (LABS, IMAGING, TESTING) - I reviewed patient records, labs, notes, testing and imaging myself where available.  Lab Results  Component Value Date   WBC 11.6 (H) 12/30/2017   HGB 12.3 12/30/2017   HCT 38.6 12/30/2017   MCV 77.1 (L) 12/30/2017   PLT 287.0 12/30/2017      Component Value Date/Time   NA 143 11/12/2017 1154   NA 143 11/22/2013 1514   K 3.3 (L) 11/12/2017 1154   K 3.1 (L) 11/22/2013 1514   CL 102 11/12/2017 1154   CL 105 02/18/2012 1207   CO2 27 11/12/2017 1154   CO2 25 11/22/2013 1514   GLUCOSE 96 11/12/2017 1154   GLUCOSE 105 11/22/2013 1514   GLUCOSE 96 02/18/2012 1207   BUN 9 11/12/2017 1154   BUN 6.6 (L) 11/22/2013 1514   CREATININE 0.65 11/12/2017 1154   CREATININE 0.8 11/22/2013 1514   CALCIUM 8.8 11/12/2017 1154   CALCIUM 9.0 11/22/2013 1514   PROT 6.4 11/12/2017 1154   PROT 7.4 11/22/2013 1514   ALBUMIN 3.7 11/12/2017 1154   ALBUMIN 3.3 (L) 11/22/2013 1514   AST 13 11/12/2017 1154   AST 15 11/22/2013 1514   ALT 16 11/12/2017 1154   ALT 13 11/22/2013 1514   ALKPHOS 89 11/12/2017 1154   ALKPHOS 91 11/22/2013 1514   BILITOT <0.2 11/12/2017 1154   BILITOT 0.23 11/22/2013 1514   GFRNONAA 101 11/12/2017 1154   GFRAA 116 11/12/2017 1154       ASSESSMENT AND PLAN  Multiple sclerosis (HCC) - Plan: Hepatitis B core antibody, total, Hepatitis B surface antigen, CBC with Differential/Platelet, QuantiFERON-TB Gold Plus, Hepatitis B surface antibody,qualitative, Hepatic function panel, Varicella zoster antibody, IgG, Cytochrome P450 2C9 Genotyping  High risk medication use - Plan: Hepatitis B core antibody, total, Hepatitis B surface antigen, CBC with Differential/Platelet, QuantiFERON-TB Gold  Plus, Hepatitis B surface antibody,qualitative, Hepatic function panel, Varicella zoster antibody, IgG, Cytochrome P450 2C9 Genotyping  Taking drug for chronic disease  Urinary frequency  Numbness  Other fatigue  1.   As insurance would not cover Avonex anymore, we went over the list of authorized medications with her.  She is interested in switching to [place].  We will check the appropriate blood work. 2.  Continue other medications for MS symptoms..   3.  Try to stay active and exercises as tolerated. 4.  She will return to see me in 6 months or sooner if there are new or worsening neurologic symptoms.   Elliot Simoneaux A. Felecia Shelling, MD, PhD 02/17/3844, 6:59 PM Certified in Neurology, Clinical Neurophysiology, Sleep Medicine, Pain Medicine and Neuroimaging  [facility] [address], [place], [place] [address] [phone]

## 2018-02-28 LAB — CBC WITH DIFFERENTIAL/PLATELET
BASOS: 1 %
Basophils Absolute: 0.1 10*3/uL (ref 0.0–0.2)
EOS (ABSOLUTE): 0.2 10*3/uL (ref 0.0–0.4)
EOS: 2 %
HEMATOCRIT: 38 % (ref 34.0–46.6)
HEMOGLOBIN: 12.4 g/dL (ref 11.1–15.9)
Immature Grans (Abs): 0 10*3/uL (ref 0.0–0.1)
Immature Granulocytes: 0 %
Lymphocytes Absolute: 3.6 10*3/uL — ABNORMAL HIGH (ref 0.7–3.1)
Lymphs: 31 %
MCH: 25.9 pg — ABNORMAL LOW (ref 26.6–33.0)
MCHC: 32.6 g/dL (ref 31.5–35.7)
MCV: 79 fL (ref 79–97)
MONOCYTES: 5 %
Monocytes Absolute: 0.5 10*3/uL (ref 0.1–0.9)
NEUTROS PCT: 61 %
Neutrophils Absolute: 7.4 10*3/uL — ABNORMAL HIGH (ref 1.4–7.0)
Platelets: 297 10*3/uL (ref 150–450)
RBC: 4.79 x10E6/uL (ref 3.77–5.28)
RDW: 15.9 % — ABNORMAL HIGH (ref 11.7–15.4)
WBC: 11.9 10*3/uL — AB (ref 3.4–10.8)

## 2018-02-28 LAB — HEPATIC FUNCTION PANEL
ALBUMIN: 3.9 g/dL (ref 3.5–5.5)
ALT: 17 IU/L (ref 0–32)
AST: 20 IU/L (ref 0–40)
Alkaline Phosphatase: 96 IU/L (ref 39–117)
BILIRUBIN TOTAL: 0.3 mg/dL (ref 0.0–1.2)
Bilirubin, Direct: 0.07 mg/dL (ref 0.00–0.40)
TOTAL PROTEIN: 7.1 g/dL (ref 6.0–8.5)

## 2018-02-28 LAB — QUANTIFERON-TB GOLD PLUS
QuantiFERON Mitogen Value: >10 IU/mL
QuantiFERON Nil Value: 0.02 IU/mL
QuantiFERON TB1 Ag Value: 0.02 IU/mL
QuantiFERON TB2 Ag Value: 0.02 IU/mL
QuantiFERON-TB Gold Plus: NEGATIVE

## 2018-02-28 LAB — VARICELLA ZOSTER ANTIBODY, IGG: Varicella zoster IgG: 1087 index (ref >165)

## 2018-02-28 LAB — HEPATITIS B SURFACE ANTIGEN: Hepatitis B Surface Ag: NEGATIVE

## 2018-02-28 LAB — HEPATITIS B CORE ANTIBODY, TOTAL: Hep B Core Total Ab: NEGATIVE

## 2018-02-28 LAB — CYTOCHROME P450 2C9 GENOTYPING

## 2018-02-28 LAB — HEPATITIS B SURFACE ANTIBODY,QUALITATIVE: Hep B Surface Ab, Qual: NONREACTIVE

## 2018-03-03 ENCOUNTER — Telehealth: Payer: Self-pay | Admitting: *Deleted

## 2018-03-03 NOTE — Telephone Encounter (Signed)
Called pt. Advised Dr. Felecia Shelling states labs ok for Mayzent. She states she wants to finish out her supply of Avonex first. She has about 3 months and 2 weeks left. She also said she has not decided yet if she wants to start [place] or Aubagio and told Dr. Felecia Shelling she would let him know.  Advised I will discuss with Dr. Felecia Shelling and call back.  She had eye exam via Dr Alois Cliche back November 2019 Address: [address] [place], [place] [address] Phone: [phone]. Also advised she will need EKG if we go forward for Mayzent and we will set her up to have that done in our office.  She verbalized understanding.

## 2018-03-03 NOTE — Telephone Encounter (Signed)
I called and LVM for pt relaying below information. Asked her to call us back once she has about a month of Avonex left. Gave [facility] phone number if she has further questions/concerns.

## 2018-03-03 NOTE — Telephone Encounter (Addendum)
Spoke with Dr. Felecia Shelling- pt should let us know when she gets down to one month left of Avonex and let us know which medication she would like to proceed with at that time. (Either the Aubagio or Mayzent). If she decides Mayzent, we will still need to complete EKG and have her get eye exam within last 3 months.

## 2018-03-05 NOTE — Telephone Encounter (Signed)
Spoke with [facility] [employer].  She sts. she spoke with [facility] this afternoon and Avonex has been added back to her approved list of dmt's for MS. As she never had a lapse in therapy, she was able to go ahead and order refills. She is not going to start Aubagio/fim

## 2018-03-05 NOTE — Telephone Encounter (Signed)
Pt has called for RN Terrence Dupont, she is asking for a call back re: message on 01-21

## 2018-03-16 ENCOUNTER — Ambulatory Visit
Admission: RE | Admit: 2018-03-16 | Discharge: 2018-03-16 | Disposition: A | Discharge status: Home / Self Care | Payer: BLUE CROSS/BLUE SHIELD | Source: Ambulatory Visit | Attending: Hematology and Oncology | Admitting: Hematology and Oncology

## 2018-03-16 DIAGNOSIS — Z1501 Genetic susceptibility to malignant neoplasm of breast: Secondary | ICD-10-CM

## 2018-03-16 DIAGNOSIS — Z1509 Genetic susceptibility to other malignant neoplasm: Principal | ICD-10-CM

## 2018-03-16 DIAGNOSIS — Z1502 Genetic susceptibility to malignant neoplasm of ovary: Principal | ICD-10-CM

## 2018-03-19 ENCOUNTER — Other Ambulatory Visit: Payer: Self-pay | Admitting: Internal Medicine

## 2018-03-19 DIAGNOSIS — J45991 Cough variant asthma: Secondary | ICD-10-CM

## 2018-04-06 ENCOUNTER — Telehealth: Payer: Self-pay | Admitting: *Deleted

## 2018-04-06 NOTE — Telephone Encounter (Signed)
Received another request from [facility] to submit PA for Avonex. I completed and faxed back. Waiting on determination.

## 2018-04-10 NOTE — Telephone Encounter (Signed)
Spoke with [facility] and advised that we submitted PA and appeal for Avonex, both of which were denied, then received another PA request from [facility], which we completed and returned, now have received another PA request from [place] and not sure why. I was told that additional chart notes from office visits are required, showing any tried and failed meds. Pt. was dx. with MS in 2009 and started Avonex at that time. She had one new MS spot in 2014 and was switched to Tecfidera, which she was not able to tolerate. Due to pt. preference, Avonex was restarted. I have faxed pt's 11/12/17 ov note to [facility], fax# [phone], and asked them to attach it to PA# [id number]/[id number]

## 2018-04-16 NOTE — Telephone Encounter (Signed)
I called and spoke with Jacqueline Adams who transferred me to Jacqueline Adams (clincial pharmacist with [facility]) to check on status of approval for Avonex sine OV notes were submitted 04/10/18 by Jacqueline Searles, RN. They stated PA denied again and appeal would need to be done. I spent over a hour on the phone providing clinical info/rationale to get rx approved. They re-submitted second level appeal urgently to include appeal letter from 02/13/18 and OV notes that were submitted 04/10/18 as we felt it was not reviewed correctly. She spoke with appeals pharmacist who is reviewing and should have determination in 72 hr.   We tried getting names of all those involved in PA/appeal denial but they would not provide this information to [place].   I received name of supervisor that Jacqueline Adams was speaking with: Jacqueline Adams. Phone: [phone]. She transferred me to her VM as she could not speak with me. I expressed concerns and advised I will be following up Monday on this issue. I provided my name twice and office phone number for her. Jacqueline Adams advised her that I would be out of the office tomorrow.

## 2018-04-20 NOTE — Telephone Encounter (Signed)
Spoke with Dr. Felecia Shelling- she will need to switch to different DMT as previously discussed. I will call pt.

## 2018-04-20 NOTE — Telephone Encounter (Signed)
Received another fax dated 04/18/18 that PA Avonex still denied, stating they did not feel it was medically necessary, that current medical literature did not support the use of Avonex over the available formulary alternatives. I will speak with Dr. Felecia Shelling to see how he would like to proceed.

## 2018-04-20 NOTE — Telephone Encounter (Signed)
Called and spoke with pt. Relayed below information. She does not want to do Aubagio d/t possible hair loss. Does not want to do Mayzent or Betaseron d/t 3x/week inj SQ (cannot tolerate per pt). Scheduled f/u for her to come in and discuss treatment options with Dr. Felecia Shelling on 04/22/18 at 2:30pm, check in 2:00pm. She verbalized understanding.

## 2018-04-22 ENCOUNTER — Encounter: Payer: Self-pay | Admitting: Neurology

## 2018-04-22 ENCOUNTER — Other Ambulatory Visit: Payer: Self-pay

## 2018-04-22 ENCOUNTER — Ambulatory Visit: Payer: BLUE CROSS/BLUE SHIELD | Admitting: Neurology

## 2018-04-22 VITALS — BP 110/60 | HR 100 | Ht 64.0 in | Wt 219.0 lb

## 2018-04-22 DIAGNOSIS — G35 Multiple sclerosis: Secondary | ICD-10-CM | POA: Diagnosis not present

## 2018-04-22 DIAGNOSIS — R5383 Other fatigue: Secondary | ICD-10-CM | POA: Diagnosis not present

## 2018-04-22 DIAGNOSIS — R200 Anesthesia of skin: Secondary | ICD-10-CM

## 2018-04-22 NOTE — Progress Notes (Signed)
[facility]  PATIENT: Jacqueline Adams DOB: 01-21-1961  REFERRING DOCTOR OR PCP:  Rolan Lipa  _________________________________   HISTORICAL  CHIEF COMPLAINT:  Chief Complaint  Patient presents with  . Follow-up    RM 12, alone. Here to discuss DMT options for MS. She does not want to go on any other MS medication. Wants to stay on Avonex. Explained Aubagio does not cause permanent hair loss, but she still does not want to try this. I did explain the risk of relapse with going off of a DMT. She mentioned possibly doing this instead of going to a different DMT.     HISTORY OF PRESENT ILLNESS:   Jacqueline Adams is a 58 y.o. woman with MS diagnosed in 2009.     Update 04/22/2018: She is on Avonex for her MS but insurance is not authorizing her for more.   She has about 8-10 more shots.     We have tried to get her covered and appealed but the appeal was also turned down.     She had tried Betaseron in the past but had severe skin reactions.    She is concerned about akin reactions with Copaxone/glatopa.  She had stomach upset with Tecfidera.     She is concerned about hair loss with Aubagio.    She has recent pneumonia and is not a good candidate for Ocrevus or Gilenya/Mayzent.  Her MS appears stable and she has numbness in her hands which is old.   She denies new numbness.    Gait is doing ok.   She had a bad ankle strain playing a role in her mildly wide tandem gait.    Bladder function is doing well.     Update 02/19/2018: She feels her MS is stable.   No recent exacerbation.   She tolerates it well but her insurance refuses to cover Avonex (we appealed).   The list of preferred agents are: Aubagio, Betaseron, copaxone, Gilenya, glatiramer, Glatopa, Mayzent, Tecfidera, Tysabri.     She had tried and failed Tecfidera in the past due to stomach upset.      Gait is fine.   She notes tingling in hr hands,       Bladder is doing well.   Fatigue is her main issue.    She has a severe sprain of the left ankle and is wearing a boot.    She is most interested in either Mayzent or Aubagio and we went over pros and cons of each.    She had an eye exam in November (goes every 6 months as she has borderline glaucoma) and was told everything was fine.     Update 11/12/2017: She feels the MS is mostly stable and she has no exacerbation.  She is on Avonex as her DMT.    She was recently diagnosed with pneumonia and is on Levaquin (completes today), prednisone and an inhaler.    She feels wiped out and went down to 1/2 days at work.     She also has more trouble with sleep, maintenance issues worse than onset insomnia.        Her gait is doing ok.   The right leg sometimes feels a little weaker.    She has some numbness and tingling in the hands.     She denies any current bladder issues.   Vision is doing well.   She has no corrective lenses.   She denies depression or anxiety.  Cognition is fine.    She has some neck pain.   Flexeril helped with the pain but she is out.  Update 05/12/2017:   She remains on Avonex weekly for her multiple sclerosis.  She feels that she has been stable with no exacerbations recently.  She notes no significant injection reactions and gets mild flulike symptoms, helped by NSAIDs,for just one night after her shots.  Gait and balance are stable.  Occasionally she feels a little off balance but does not have any falls.  She is able to climb stairs.  She continues on Flexeril prn for right-sided muscle spasms.    No significant weakness or numbness.  She has some urinary frequency and nocturia but this is not bothering her too much.  No MS related visual problems.  She notes some fatigue that generally is worse in hot weather.  She sleeps well most nights but has nocturia waking her up about twice.  She usually falls back asleep easily.  She has some stress with her mom having health issues and she helps a lot.  This makes her more tired.    She  denies any significant problems with mood disturbances.  Cognition is fine.  She also has migraine headaches.  These are doing better on Topamax.   She tolerates it well.   She has some tingling, better now than initially.  Update 11/11/2016:   She feels her MS has been stable. She takes Avonex weekly. She does not have any injection reactions. She does get mild flulike symptoms that night only, helped by NSAIDs.       She is noting no change in her gait. Balance is sometimes off but she could walk a mile without difficulty. She does not need support. She is having fewer of the right sided spasms that she did at the last visit. Flexeril has helped and she tolerates it well. She still has urinary frequency, usually 2-3 times a night. She falls back asleep quickly.. Urinary frequency is less bothersome during the daytime. Vision is fine. She does go to an ophthalmologist regularly because she has borderline glaucoma.  She notes fatigue most days. This is much worse with heat. She has done better the last 2 weeks she sleeps well most nights. Although she has nocturia she quickly falls back asleep. She does not note any significant problems with mood or cognition.  Headaches have done better on Topamax.  She has noted a little bit of numbness and tingling in the hands and toes but this is not bothering her much.   She had MRI of the brain and cervical spine 05/22/2016 were personally reviewed..   The MRI of the brain shows lesion consistent with MS. None of them enhanced. Compared to the 2017 MRI, there were no significant changes. The MRI of the cervical spine did not show any MS lesions.  _____________________________________________ From 05/09/2016  MS:   She feels that she has been stable on Avonex. In 2016 she switch back to Avonex from Tecfidera due to side effects. She has not had any exacerbations.  I have reviewed the MRI of the brain dated 05/14/2015. It shows T2/FLAIR hyperintense foci in the  periventricular and juxtacortical white matter, mostly locatedin the left hemisphere. None of the foci appears to be acute. When compared to the MRI from 12/07/2013, there was no interval change.  Headaches:   She is having more pressure like headaches in her face near the sinuses.    These are distinct  from her migraines.     Gait/strength/sensation:  She denies problems with her gait or with strength. She is noting spasms of the right arm that will last for about a minute and then relax again. With activities present she is clumsy in the arm. She also has noted this in her legs but it is less troublesome than in the arm.  At times, the right foot locks up and she stumbles.    She has mild right > left foot numbness and tingling, present x years.  Bladder/bowel:    She denies any urinary urgency, frequency or hesitancy.  She has 2-3  x nocturia but quickly falls back asleep.   Frequency is not bad enough for her to want a medication.     She feels she empties well.   No UTI's    Npo bowel issues  Vision   She feels vision is good and she denies any MS related visual problems.  She has slightly elevate IOP but not bad enough to be called glaucoma and she is followed q 6 months for tonometry.   Eyes often itchy and she uses drops.    Fatigue/sleep:   She has fatigue that is 24/7 and is worse with heat..  She travels some for her job (did a lot in the spring but less for rest of year)  She also is going through menopause and has hot flashes.      She usually sleeps well but is doing worse with menopause.    Mood/Cognition:   She denies any depression or anxiety.    Last year was tougher with sister's death. She works as a Garment/textile technologist for [employer].    She denies any cognitive difficulty.       Neck Pain:    Neck pain bothers her more some days despite changing her workstation around to have the monitor at eye level.  It bothers her more when she has a headache. She has cervical disc degeneration.      Vit D was low in the past.    MS History:  In 2009, she had the onset of right arm and leg tingling and clumsiness. At the time, she was seeing Dr. Earley Favor for migraines and he performed a nerve conduction study only showing mild carpal tunnel syndrome. Symptoms worsened the next day and she was referred to Dr. Erling Cruz. An MRI of the brain, cervical spine and a lumbar puncture was performed. The brain MRI showed a large left frontoparietal periventricular focus and several other foci. CSF was consistent with multiple sclerosis.  MRI of the cervical spine was normal. She was started on Avonex.   She was switched to Tecfidera in late 2014 as she had one new focus on her 2014 MRI. She switched back to Avonex in 2016.[name]   REVIEW OF SYSTEMS: Constitutional: No fevers, chills, sweats, or change in appetite.   She has fatigue Eyes: No visual changes, double vision, eye pain.   Being followed for borderline high intraocular pressure. Ear, nose and throat: No hearing loss, ear pain/.    Respiratory: No shortness of breath at rest or with exertion.   No wheezes.   She has frequent bronchitis. Cardiovascular: No chest pain, palpitations GastrointestinaI: No nausea, vomiting, diarrhea, abdominal pain, fecal incontinence Genitourinary: No dysuria.  She has urgency and or frequency.  2-3 x  nocturia. Musculoskeletal: No neck pain, back pain Integumentary: No rash, pruritus, skin lesions Neurological: as above Psychiatric: No depression at this time.  No anxiety Endocrine: No palpitations, diaphoresis, change in appetite, change in weigh or increased thirst Hematologic/Lymphatic: No anemia, purpura, petechiae. Allergic/Immunologic: No itchy/runny eyes, nasal congestion, recent allergic reactions, rashes  ALLERGIES: Allergies  Allergen Reactions  . Procaine Swelling  . Relafen [Nabumetone] Swelling    HOME MEDICATIONS:  Current Outpatient Medications:  .  AVONEX PREFILLED 30 MCG/0.5ML PSKT injection,  INJECT ONE SYRINGE INTRAMUSCULARLY ONCE WEEKLY. REFRIGERATE. PROTECT FROM LIGHT. ALLOW TO WARM TO ROOM TEMPERATURE PRIOR TO USE., Disp: 3 kit, Rfl: 3 .  cyclobenzaprine (FLEXERIL) 5 MG tablet, Take 1 tablet (5 mg total) by mouth 3 (three) times daily as needed for muscle spasms., Disp: 90 tablet, Rfl: 11 .  fluticasone (FLONASE) 50 MCG/ACT nasal spray, Place 2 sprays into both nostrils daily., Disp: 16 g, Rfl: 2 .  ibuprofen (ADVIL,MOTRIN) 400 MG tablet, Take 400 mg by mouth every 8 (eight) hours as needed.  , Disp: , Rfl:  .  Multiple Vitamins-Minerals (CENTRUM SILVER ADULT 50+ PO), Take by mouth., Disp: , Rfl:  .  pantoprazole (PROTONIX) 40 MG tablet, TAKE TAKE 1 TABLET BY MOUTH 30- 60 MINUTES BEFORE YOUR FIRST AND LAST MEALS OF THE DAY, Disp: 180 tablet, Rfl: 0 .  simvastatin (ZOCOR) 20 MG tablet, Take 1 tablet by mouth daily., Disp: , Rfl:  .  VENTOLIN HFA 108 (90 BASE) MCG/ACT inhaler, Inhale 2 puffs into the lungs every 6 (six) hours as needed., Disp: , Rfl:  .  VITAMIN D PO, Take 5,000 Units by mouth daily., Disp: , Rfl:  .  topiramate (TOPAMAX) 25 MG tablet, Take 25 mg by mouth., Disp: , Rfl:  No current facility-administered medications for this visit.   Facility-Administered Medications Ordered in Other Visits:  .  gadopentetate dimeglumine (MAGNEVIST) injection 20 mL, 20 mL, Intravenous, Once PRN, Sharronda Schweers, Nanine Means, MD  PAST MEDICAL HISTORY: Past Medical History:  Diagnosis Date  . Bronchitis   . Cervical spondylarthritis   . Headache(784.0)   . High cholesterol   . Leukocytosis 2009  . Multiple sclerosis ([place]) 2009   followed by Dr. Erling Cruz  . Rhinitis   . Sinusitis     PAST SURGICAL HISTORY: Past Surgical History:  Procedure Laterality Date  . CESAREAN SECTION     x 3  . PARTIAL HYSTERECTOMY  2006   for uterine fibroid.     FAMILY HISTORY: Family History  Problem Relation Age of Onset  . Cancer Mother 63       ovarian cancer; now with suspected breast cancer  .  Diabetes Mother        also HTN  . Clotting disorder Father   . Breast cancer Maternal Aunt   . Cancer Maternal Grandmother        unknown female reproductive organ cancer  . Breast cancer Maternal Aunt   . Breast cancer Maternal Aunt   . Colon cancer Sister 27       anal cancer (2014)  . Lung cancer Sister 74  . Stroke Sister 11  . Ovarian cancer Sister 58  . BRCA 1/2 Sister     SOCIAL HISTORY:  Social History   Socioeconomic History  . Marital status: Married    Spouse name: [name]  . Number of children: 2  . Years of education: College  . Highest education level: Not on file  Occupational History  . Occupation: Print production planner: [employer]  Social Needs  . Financial resource strain: Not on file  . Food insecurity:  Worry: Not on file    Inability: Not on file  . Transportation needs:    Medical: Not on file    Non-medical: Not on file  Tobacco Use  . Smoking status: Never Smoker  . Smokeless tobacco: Never Used  Substance and Sexual Activity  . Alcohol use: No  . Drug use: No  . Sexual activity: Yes    Birth control/protection: None, Surgical  Lifestyle  . Physical activity:    Days per week: Not on file    Minutes per session: Not on file  . Stress: Not on file  Relationships  . Social connections:    Talks on phone: Not on file    Gets together: Not on file    Attends religious service: Not on file    Active member of club or organization: Not on file    Attends meetings of clubs or organizations: Not on file    Relationship status: Not on file  . Intimate partner violence:    Fear of current or ex partner: Not on file    Emotionally abused: Not on file    Physically abused: Not on file    Forced sexual activity: Not on file  Other Topics Concern  . Not on file  Social History Narrative   Patient lives at home with her spouse.   Caffeine Use: 2 sodas daily     PHYSICAL EXAM  Vitals:   04/22/18 1440  BP: 110/60  Pulse: 100  SpO2:  98%  Weight: 219 lb (99.3 kg)  Height: '5\' 4"'$  (1.626 m)    Body mass index is 37.59 kg/m.   General: The patient is well-developed and well-nourished and in no acute distress   Neurologic Exam  Mental status: The patient is alert and oriented x 3 at the time of the examination. The patient has apparent normal recent and remote memory, with an apparently normal attention span and concentration ability.   Speech is normal.  Cranial nerves: Extraocular movements are full.   Facial strength is normal.  Trapezius strength is normal.  No obvious hearing deficits are noted.  Motor:  Muscle bulk is normal.   Tone is normal. Strength is  5 / 5 in all 4 extremities.   Sensory: Intact touch sensation in the arms and legs..  Coordination: Cerebellar testing reveals good finger-nose-finger and heel-to-shin bilaterally.  Gait and station: Station is normal.   The gait is normal and the tandem gait is mildly wide.  Romberg is negative.   Reflexes: Deep tendon reflexes are normal and symmetric in the arms.  Deep tendon reflexes are increased at the knees, right greater than left.  There is no ankle clonus.   DIAGNOSTIC DATA (LABS, IMAGING, TESTING) - I reviewed patient records, labs, notes, testing and imaging myself where available.  Lab Results  Component Value Date   WBC 11.9 (H) 02/19/2018   HGB 12.4 02/19/2018   HCT 38.0 02/19/2018   MCV 79 02/19/2018   PLT 297 02/19/2018      Component Value Date/Time   NA 143 11/12/2017 1154   NA 143 11/22/2013 1514   K 3.3 (L) 11/12/2017 1154   K 3.1 (L) 11/22/2013 1514   CL 102 11/12/2017 1154   CL 105 02/18/2012 1207   CO2 27 11/12/2017 1154   CO2 25 11/22/2013 1514   GLUCOSE 96 11/12/2017 1154   GLUCOSE 105 11/22/2013 1514   GLUCOSE 96 02/18/2012 1207   BUN 9 11/12/2017 1154   BUN 6.6 (  L) 11/22/2013 1514   CREATININE 0.65 11/12/2017 1154   CREATININE 0.8 11/22/2013 1514   CALCIUM 8.8 11/12/2017 1154   CALCIUM 9.0 11/22/2013 1514    PROT 7.1 02/19/2018 0918   PROT 7.4 11/22/2013 1514   ALBUMIN 3.9 02/19/2018 0918   ALBUMIN 3.3 (L) 11/22/2013 1514   AST 20 02/19/2018 0918   AST 15 11/22/2013 1514   ALT 17 02/19/2018 0918   ALT 13 11/22/2013 1514   ALKPHOS 96 02/19/2018 0918   ALKPHOS 91 11/22/2013 1514   BILITOT 0.3 02/19/2018 0918   BILITOT 0.23 11/22/2013 1514   GFRNONAA 101 11/12/2017 1154   GFRAA 116 11/12/2017 1154       ASSESSMENT AND PLAN  Numbness  Multiple sclerosis (HCC) - Plan: MR BRAIN W WO CONTRAST  Other fatigue   1.  We have been trouble getting Avonex covered by her insurance as it is not on formulary.  We will see if we can get her free drug through patient assistance.  If not, I would urge her to go on Aubagio or Mayzent.  We also need to check an MRI of the brain to determine if there is any subclinical activity that may help to choose a different disease modifying therapy. 2.  Continue other medications for MS symptoms..   3.  Try to stay active and exercises as tolerated. 4.  She will return to see me in 6 months or sooner if there are new or worsening neurologic symptoms.   Tenelle Andreason A. Felecia Shelling, MD, PhD 1/36/8599, 2:34 PM Certified in Neurology, Clinical Neurophysiology, Sleep Medicine, Pain Medicine and Neuroimaging  [facility] [address] [place], [place] [address] [phone]

## 2018-04-28 ENCOUNTER — Telehealth: Payer: Self-pay | Admitting: Neurology

## 2018-04-28 NOTE — Telephone Encounter (Signed)
MR Brain w/wo contrast Dr. Cheree Ditto Auth: [place] Ref # [id number]. Patient is scheduled at [facility] for 04/29/18

## 2018-04-29 ENCOUNTER — Ambulatory Visit: Payer: BLUE CROSS/BLUE SHIELD

## 2018-04-29 ENCOUNTER — Other Ambulatory Visit: Payer: Self-pay

## 2018-04-29 DIAGNOSIS — G35 Multiple sclerosis: Secondary | ICD-10-CM

## 2018-04-29 MED ORDER — GADOBENATE DIMEGLUMINE 529 MG/ML IV SOLN
20.0000 mL | Freq: Once | INTRAVENOUS | Status: AC | PRN
  Administered 2018-04-29: 20 mL via INTRAVENOUS

## 2018-04-30 ENCOUNTER — Telehealth: Payer: Self-pay | Admitting: *Deleted

## 2018-04-30 NOTE — Telephone Encounter (Signed)
Spoke to patient and she is aware of her MRI results.

## 2018-04-30 NOTE — Telephone Encounter (Signed)
-----   Message from Britt Bottom, MD sent at 04/30/2018  5:27 PM EDT ----- Please let the patient know that the MRI shows no new lesions

## 2018-05-08 ENCOUNTER — Telehealth: Payer: Self-pay | Admitting: Neurology

## 2018-05-08 NOTE — Telephone Encounter (Signed)
Patient called in and stated she has been approved by [employer] to continue her AVONEX PREFILLED 30 MCG/0.5ML PSKT injection and she needs the medical release form filled out and faxed over .  Fax # [phone]  Patient is requesting a call back to discuss , she states anytime is a good time for a call back, please call before fax due to different drug provider.

## 2018-05-08 NOTE — Telephone Encounter (Signed)
Returned call to patient.  She wanted to let us know we may received paperwork or a prescription request from [facility] or [facility] (pharmacy that [facility] uses for patient assistance).  She is going to contact [facility] to set up her home delivery and will let us know if she runs into any trouble.  She is aware we are happy to help.

## 2018-05-14 ENCOUNTER — Ambulatory Visit: Payer: BLUE CROSS/BLUE SHIELD | Admitting: Neurology

## 2018-05-14 ENCOUNTER — Telehealth: Payer: Self-pay | Admitting: Neurology

## 2018-05-14 MED ORDER — INTERFERON BETA-1A 30 MCG/0.5ML IM PSKT: PREFILLED_SYRINGE | INTRAMUSCULAR | 3 refills | Status: DC

## 2018-05-14 NOTE — Telephone Encounter (Signed)
Called and spoke with pt. She states [facility] sent fax request for rx avonex last week. Advised we did not received anything. I will escribe rx and f/u with them to make sure they receive it. She will call back if any further questions or concerns.   Phone: [phone] Fax: [phone]  Unable to find pharmacy in [place]. I printed rx and will have MD sign then fax

## 2018-05-14 NOTE — Telephone Encounter (Signed)
Pt called wanting to know the update on her AVONEX PREFILLED 30 MCG/0.5ML PSKT injection she is needing to get them from the pharmacy but she states they are waiting on a Fax answer. Please advise.

## 2018-05-14 NOTE — Telephone Encounter (Signed)
Called pharmacy and spoke with Zina. She verified they received faxed rx for Avonex. Nothing further needed.

## 2018-05-14 NOTE — Telephone Encounter (Signed)
Faxed printed/signed rx Avonex to fax below. Received fax confirmation.

## 2018-06-18 NOTE — Assessment & Plan Note (Signed)
BRCA2 mutation positive: Patient plans to get oophorectomy done with Dr. Ruthann Cancer.  Breast cancer surveillance:  1. Mammogram 2/4/2020is benign breast density category C Breast MRI:07/18/2016: No evidence of malignancy in bilateral breasts 2. Breast examination 04/26/2018is normal  Multiple sclerosis: Patient is currently on treatment and appears to be doing better. Brain MRI 04/30/2018: Findings of multiple sclerosis  Breast cancer surveillance: 1.  Breast exam 06/24/2017: Benign  2. mammogram 07/11/2016: Right breast possible mass warrants evaluation.  On further evaluation it is a benign lymph node 3.  Breast MRI 07/18/2016: No MR evidence of malignancy in bilateral breasts, breast MRI has been ordered but she has not done it.  GYN referral for oophorectomy: Patient will need to arrange an appointment with her gynecologist.  She plans to get the ovaries removed sometime this year.  She travels a lot for work.  She has son and her daughter ages 34 and 59.  Return to clinic in one year for follow-up. She will need annual mammograms and breast MRIs.

## 2018-06-19 ENCOUNTER — Telehealth: Payer: Self-pay | Admitting: Hematology and Oncology

## 2018-06-19 NOTE — Telephone Encounter (Signed)
Called regarding upcoming [facility] appointment, patient is notified. Screen test complete and e-mail has been sent.

## 2018-06-22 NOTE — Progress Notes (Signed)
HEMATOLOGY-ONCOLOGY [place] VISIT PROGRESS NOTE  I connected with [place] on 06/23/2018 at  9:00 AM EDT by Webex video conference and verified that I am speaking with the correct person using two identifiers.  I discussed the limitations, risks, security and privacy concerns of performing an evaluation and management service by Webex and the availability of in person appointments.  I also discussed with the patient that there may be a patient responsible charge related to this service. The patient expressed understanding and agreed to proceed.   Patient's Location: Home Physician Location: Clinic  CHIEF COMPLIANT: Follow-up of BRCA2 mutation high risk for breast cancer  INTERVAL HISTORY: Jacqueline Adams is a 58 y.o. female with history of BRCA2 mutation who is currently on annual surveillance. I last saw her a year ago. Screening mammogram in 03/16/18 showed no evidence of malignancy bilaterally. She presents today for annual follow-up.   REVIEW OF SYSTEMS:   Constitutional: Denies fevers, chills or abnormal weight loss Eyes: Denies blurriness of vision Ears, nose, mouth, throat, and face: Denies mucositis or sore throat Respiratory: Denies cough, dyspnea or wheezes Cardiovascular: Denies palpitation, chest discomfort Gastrointestinal:  Denies nausea, heartburn or change in bowel habits Skin: Denies abnormal skin rashes Lymphatics: Denies new lymphadenopathy or easy bruising Neurological:Denies numbness, tingling or new weaknesses Behavioral/Psych: Mood is stable, no new changes  Extremities: No lower extremity edema Breast: denies any pain or lumps or nodules in either breasts All other systems were reviewed with the patient and are negative.  Observations/Objective:  There were no vitals filed for this visit. There is no height or weight on file to calculate BMI.  I have reviewed the data as listed CMP Latest Ref Rng & Units 02/19/2018 11/12/2017 11/11/2016  Glucose 65 - 99  mg/dL - 96 92  BUN 6 - 24 mg/dL - 9 7  Creatinine 0.57 - 1.00 mg/dL - 0.65 0.63  Sodium 134 - 144 mmol/L - 143 145(H)  Potassium 3.5 - 5.2 mmol/L - 3.3(L) 3.8  Chloride 96 - 106 mmol/L - 102 107(H)  CO2 20 - 29 mmol/L - 27 23  Calcium 8.7 - 10.2 mg/dL - 8.8 9.4  Total Protein 6.0 - 8.5 g/dL 7.1 6.4 6.9  Total Bilirubin 0.0 - 1.2 mg/dL 0.3 <0.2 0.2  Alkaline Phos 39 - 117 IU/L 96 89 87  AST 0 - 40 IU/L _0 ALT 0 - 32 IU/L _1 Lab Results  Component Value Date   WBC 11.9 (H) 02/19/2018   HGB 12.4 02/19/2018   HCT 38.0 02/19/2018   MCV 79 02/19/2018   PLT 297 02/19/2018   NEUTROABS 7.4 (H) 02/19/2018     Assessment Plan:  BRCA gene mutation positive in female BRCA2 mutation positive: Patient plans to get oophorectomy done with Dr. Ruthann Cancer.  Breast cancer surveillance:  1. Mammogram 2/4/2020is benign breast density category C Breast MRI:07/18/2016: No evidence of malignancy in bilateral breasts 2. Breast examination 04/26/2018is normal  Multiple sclerosis: Patient is currently on treatment and appears to be doing better. Brain MRI 04/30/2018: Findings of multiple sclerosis  Breast cancer surveillance: 1.  Breast exam 06/24/2017: Benign  2. mammogram 03/16/2018: Benign breast density category C 3.  Breast MRI 07/18/2016: No MR evidence of malignancy in bilateral breasts, breast MRI has been ordered but she has not done it. MRI breast has been ordered for August 2020.  GYN referral for oophorectomy: Patient will need to arrange an appointment with her gynecologist.  She plans to get the ovaries removed sometime this year.  She plans to do this in June but because of COVID-19 this is been postponed.  She has not made up a new appointment yet.   She has son and her daughter ages 46 and 60.  Return to clinic in one year for follow-up. She will need annual mammograms and breast MRIs.  I discussed the assessment and treatment plan with the patient. The patient was  provided an opportunity to ask questions and all were answered. The patient agreed with the plan and demonstrated an understanding of the instructions. The patient was advised to call back or seek an in-person evaluation if the symptoms worsen or if the condition fails to improve as anticipated.   I provided 11 minutes of face-to-face Web Ex time during this encounter.    Rulon Eisenmenger, MD 06/23/2018   I, Molly Dorshimer, am acting as scribe for Nicholas Lose, MD.  I have reviewed the above documentation for accuracy and completeness, and I agree with the above.

## 2018-06-23 ENCOUNTER — Inpatient Hospital Stay: Payer: BLUE CROSS/BLUE SHIELD | Attending: Hematology and Oncology | Admitting: Hematology and Oncology

## 2018-06-23 DIAGNOSIS — Z1501 Genetic susceptibility to malignant neoplasm of breast: Secondary | ICD-10-CM | POA: Diagnosis not present

## 2018-06-23 DIAGNOSIS — Z1502 Genetic susceptibility to malignant neoplasm of ovary: Secondary | ICD-10-CM | POA: Diagnosis not present

## 2018-06-23 DIAGNOSIS — G35 Multiple sclerosis: Secondary | ICD-10-CM | POA: Diagnosis not present

## 2018-06-23 DIAGNOSIS — Z1509 Genetic susceptibility to other malignant neoplasm: Secondary | ICD-10-CM

## 2018-08-18 ENCOUNTER — Other Ambulatory Visit: Payer: Self-pay | Admitting: Internal Medicine

## 2018-09-22 ENCOUNTER — Encounter: Payer: Self-pay | Admitting: Neurology

## 2018-09-22 ENCOUNTER — Ambulatory Visit: Payer: BC Managed Care – PPO | Admitting: Neurology

## 2018-09-22 ENCOUNTER — Other Ambulatory Visit: Payer: Self-pay

## 2018-09-22 VITALS — BP 130/79 | HR 79 | Temp 96.8°F | Ht 64.5 in | Wt 218.0 lb

## 2018-09-22 DIAGNOSIS — Z79899 Other long term (current) drug therapy: Secondary | ICD-10-CM | POA: Diagnosis not present

## 2018-09-22 DIAGNOSIS — D72829 Elevated white blood cell count, unspecified: Secondary | ICD-10-CM | POA: Diagnosis not present

## 2018-09-22 DIAGNOSIS — G35 Multiple sclerosis: Secondary | ICD-10-CM

## 2018-09-22 DIAGNOSIS — R5383 Other fatigue: Secondary | ICD-10-CM

## 2018-09-22 DIAGNOSIS — G43009 Migraine without aura, not intractable, without status migrainosus: Secondary | ICD-10-CM | POA: Diagnosis not present

## 2018-09-22 DIAGNOSIS — R200 Anesthesia of skin: Secondary | ICD-10-CM

## 2018-09-22 MED ORDER — IBUPROFEN 400 MG PO TABS: 400.0000 mg | ORAL_TABLET | Freq: Three times a day (TID) | ORAL | 5 refills | Status: DC | PRN

## 2018-09-22 NOTE — Progress Notes (Addendum)
[facility]  PATIENT: Jacqueline Adams DOB: [birth year]  REFERRING DOCTOR OR PCP:  Rolan Lipa  _________________________________   HISTORICAL  CHIEF COMPLAINT:  Chief Complaint  Patient presents with   Follow-up    RM 12, alone. No major issues. If she stands too long, right lower leg will go numb. Last seen 04/22/18.    HISTORY OF PRESENT ILLNESS:   Jacqueline Adams is a 58 y.o. woman with MS diagnosed in 2009.    Update 09/22/2018: She feels stable with no exacerbation.   She was able to get Avonex through patient assistance.  Injections go well without s.e.     She is walking well.    She denies difficulty with balance.    Strength is ok.  If she stands a long time, she gets tingling in the right anterolateral thigh.   No numbness elsewhere.  She reports more issues with headache.  She has some pain in the back of the head.  She can do her work from home with [employer].   She works with [employer].  She has mild fatigue .    She notes some stress and sleep is sometimes off but generally feels good and denies depression.   Cognition is fine.     Update 04/22/2018: She is on Avonex for her MS but insurance is not authorizing her for more.   She has about 8-10 more shots.     We have tried to get her covered and appealed but the appeal was also turned down.     She had tried Betaseron in the past but had severe skin reactions.    She is concerned about akin reactions with Copaxone/glatopa.  She had stomach upset with Tecfidera.     She is concerned about hair loss with Aubagio.    She has recent pneumonia and is not a good candidate for Ocrevus or Gilenya/Mayzent.  Her MS appears stable and she has numbness in her hands which is old.   She denies new numbness.    Gait is doing ok.   She had a bad ankle strain playing a role in her mildly wide tandem gait.    Bladder function is doing well.     Update 02/19/2018: She feels her MS is stable.   No recent exacerbation.    She tolerates it well but her insurance refuses to cover Avonex (we appealed).   The list of preferred agents are: Aubagio, Betaseron, copaxone, Gilenya, glatiramer, Glatopa, Mayzent, Tecfidera, Tysabri.     She had tried and failed Tecfidera in the past due to stomach upset.      Gait is fine.   She notes tingling in hr hands,       Bladder is doing well.   Fatigue is her main issue.   She has a severe sprain of the left ankle and is wearing a boot.    She is most interested in either Mayzent or Aubagio and we went over pros and cons of each.    She had an eye exam in November (goes every 6 months as she has borderline glaucoma) and was told everything was fine.     Update 11/12/2017: She feels the MS is mostly stable and she has no exacerbation.  She is on Avonex as her DMT.    She was recently diagnosed with pneumonia and is on Levaquin (completes today), prednisone and an inhaler.    She feels wiped out and went down to 1/2 days  at work.     She also has more trouble with sleep, maintenance issues worse than onset insomnia.        Her gait is doing ok.   The right leg sometimes feels a little weaker.    She has some numbness and tingling in the hands.     She denies any current bladder issues.   Vision is doing well.   She has no corrective lenses.   She denies depression or anxiety.     Cognition is fine.    She has some neck pain.   Flexeril helped with the pain but she is out.  Update 05/12/2017:   She remains on Avonex weekly for her multiple sclerosis.  She feels that she has been stable with no exacerbations recently.  She notes no significant injection reactions and gets mild flulike symptoms, helped by NSAIDs,for just one night after her shots.  Gait and balance are stable.  Occasionally she feels a little off balance but does not have any falls.  She is able to climb stairs.  She continues on Flexeril prn for right-sided muscle spasms.    No significant weakness or numbness.  She has some  urinary frequency and nocturia but this is not bothering her too much.  No MS related visual problems.  She notes some fatigue that generally is worse in hot weather.  She sleeps well most nights but has nocturia waking her up about twice.  She usually falls back asleep easily.  She has some stress with her mom having health issues and she helps a lot.  This makes her more tired.    She denies any significant problems with mood disturbances.  Cognition is fine.  She also has migraine headaches.  These are doing better on Topamax.   She tolerates it well.   She has some tingling, better now than initially.  Update 11/11/2016:   She feels her MS has been stable. She takes Avonex weekly. She does not have any injection reactions. She does get mild flulike symptoms that night only, helped by NSAIDs.       She is noting no change in her gait. Balance is sometimes off but she could walk a mile without difficulty. She does not need support. She is having fewer of the right sided spasms that she did at the last visit. Flexeril has helped and she tolerates it well. She still has urinary frequency, usually 2-3 times a night. She falls back asleep quickly.. Urinary frequency is less bothersome during the daytime. Vision is fine. She does go to an ophthalmologist regularly because she has borderline glaucoma.  She notes fatigue most days. This is much worse with heat. She has done better the last 2 weeks she sleeps well most nights. Although she has nocturia she quickly falls back asleep. She does not note any significant problems with mood or cognition.  Headaches have done better on Topamax.  She has noted a little bit of numbness and tingling in the hands and toes but this is not bothering her much.   She had MRI of the brain and cervical spine 05/22/2016 were personally reviewed..   The MRI of the brain shows lesion consistent with MS. None of them enhanced. Compared to the 2017 MRI, there were no significant  changes. The MRI of the cervical spine did not show any MS lesions.  _____________________________________________ From 05/09/2016  MS:   She feels that she has been stable on Avonex. In 2016 she  switch back to Avonex from Tecfidera due to side effects. She has not had any exacerbations.  I have reviewed the MRI of the brain dated 05/14/2015. It shows T2/FLAIR hyperintense foci in the periventricular and juxtacortical white matter, mostly locatedin the left hemisphere. None of the foci appears to be acute. When compared to the MRI from 12/07/2013, there was no interval change.  Headaches:   She is having more pressure like headaches in her face near the sinuses.    These are distinct from her migraines.     Gait/strength/sensation:  She denies problems with her gait or with strength. She is noting spasms of the right arm that will last for about a minute and then relax again. With activities present she is clumsy in the arm. She also has noted this in her legs but it is less troublesome than in the arm.  At times, the right foot locks up and she stumbles.    She has mild right > left foot numbness and tingling, present x years.  Bladder/bowel:    She denies any urinary urgency, frequency or hesitancy.  She has 2-3  x nocturia but quickly falls back asleep.   Frequency is not bad enough for her to want a medication.     She feels she empties well.   No UTI's    Npo bowel issues  Vision   She feels vision is good and she denies any MS related visual problems.  She has slightly elevate IOP but not bad enough to be called glaucoma and she is followed q 6 months for tonometry.   Eyes often itchy and she uses drops.    Fatigue/sleep:   She has fatigue that is 24/7 and is worse with heat..  She travels some for her job (did a lot in the spring but less for rest of year)  She also is going through menopause and has hot flashes.      She usually sleeps well but is doing worse with menopause.     Mood/Cognition:   She denies any depression or anxiety.    Last year was tougher with sister's death. She works as a Garment/textile technologist for [employer].    She denies any cognitive difficulty.       Neck Pain:    Neck pain bothers her more some days despite changing her workstation around to have the monitor at eye level.  It bothers her more when she has a headache. She has cervical disc degeneration.     Vit D was low in the past.    MS History:  In 2009, she had the onset of right arm and leg tingling and clumsiness. At the time, she was seeing Dr. Earley Favor for migraines and he performed a nerve conduction study only showing mild carpal tunnel syndrome. Symptoms worsened the next day and she was referred to Dr. Erling Cruz. An MRI of the brain, cervical spine and a lumbar puncture was performed. The brain MRI showed a large left frontoparietal periventricular focus and several other foci. CSF was consistent with multiple sclerosis.  MRI of the cervical spine was normal. She was started on Avonex.   She was switched to Tecfidera in late 2014 as she had one new focus on her 2014 MRI. She switched back to Avonex in 2016.[name]   REVIEW OF SYSTEMS: Constitutional: No fevers, chills, sweats, or change in appetite.   She has fatigue Eyes: No visual changes, double vision, eye pain.   Being followed for  borderline high intraocular pressure. Ear, nose and throat: No hearing loss, ear pain/.    Respiratory: No shortness of breath at rest or with exertion.   No wheezes.   She has frequent bronchitis. Cardiovascular: No chest pain, palpitations GastrointestinaI: No nausea, vomiting, diarrhea, abdominal pain, fecal incontinence Genitourinary: No dysuria.  She has urgency and or frequency.  2-3 x  nocturia. Musculoskeletal: No neck pain, back pain Integumentary: No rash, pruritus, skin lesions Neurological: as above Psychiatric: No depression at this time.  No anxiety Endocrine: No palpitations, diaphoresis, change in  appetite, change in weigh or increased thirst Hematologic/Lymphatic: No anemia, purpura, petechiae. Allergic/Immunologic: No itchy/runny eyes, nasal congestion, recent allergic reactions, rashes  ALLERGIES: Allergies  Allergen Reactions   Procaine Swelling   Relafen [Nabumetone] Swelling    HOME MEDICATIONS:  Current Outpatient Medications:    cyclobenzaprine (FLEXERIL) 5 MG tablet, Take 1 tablet (5 mg total) by mouth 3 (three) times daily as needed for muscle spasms., Disp: 90 tablet, Rfl: 11   fluticasone (FLONASE) 50 MCG/ACT nasal spray, Place 2 sprays into both nostrils daily., Disp: 16 g, Rfl: 2   ibuprofen (ADVIL,MOTRIN) 400 MG tablet, Take 400 mg by mouth every 8 (eight) hours as needed.  , Disp: , Rfl:    interferon beta-1a (AVONEX PREFILLED) 30 MCG/0.5ML PSKT injection, INJECT ONE SYRINGE INTRAMUSCULARLY ONCE WEEKLY. REFRIGERATE. PROTECT FROM LIGHT. ALLOW TO WARM TO ROOM TEMPERATURE PRIOR TO USE., Disp: 3 kit, Rfl: 3   Multiple Vitamins-Minerals (CENTRUM SILVER ADULT 50+ PO), Take by mouth., Disp: , Rfl:    simvastatin (ZOCOR) 20 MG tablet, Take 1 tablet by mouth daily., Disp: , Rfl:    VENTOLIN HFA 108 (90 BASE) MCG/ACT inhaler, Inhale 2 puffs into the lungs every 6 (six) hours as needed., Disp: , Rfl:    VITAMIN D PO, Take 2 tablets by mouth daily. +calcium, Disp: , Rfl:    topiramate (TOPAMAX) 25 MG tablet, Take 25 mg by mouth., Disp: , Rfl:  No current facility-administered medications for this visit.   Facility-Administered Medications Ordered in Other Visits:    gadopentetate dimeglumine (MAGNEVIST) injection 20 mL, 20 mL, Intravenous, Once PRN, Montrice Montuori, Nanine Means, MD  PAST MEDICAL HISTORY: Past Medical History:  Diagnosis Date   Bronchitis    Cervical spondylarthritis    Headache(784.0)    High cholesterol    Leukocytosis 2009   Multiple sclerosis ([place]) 2009   followed by Dr. Erling Cruz   Rhinitis    Sinusitis     PAST SURGICAL HISTORY: Past  Surgical History:  Procedure Laterality Date   CESAREAN SECTION     x 3   PARTIAL HYSTERECTOMY  2006   for uterine fibroid.     FAMILY HISTORY: Family History  Problem Relation Age of Onset   Cancer Mother 49       ovarian cancer; now with suspected breast cancer   Diabetes Mother        also HTN   Clotting disorder Father    Breast cancer Maternal Aunt    Cancer Maternal Grandmother        unknown female reproductive organ cancer   Breast cancer Maternal Aunt    Breast cancer Maternal Aunt    Colon cancer Sister 21       anal cancer (2014)   Lung cancer Sister 37   Stroke Sister 7   Ovarian cancer Sister 22   BRCA 1/2 Sister     SOCIAL HISTORY:  Social History   Socioeconomic History  Marital status: Married    Spouse name: John   Number of children: 2   Years of education: College   Highest education level: Not on file  Occupational History   Occupation: Print production planner: [employer]  Social Designer, fashion/clothing strain: Not on file   Food insecurity    Worry: Not on file    Inability: Not on file   Transportation needs    Medical: Not on file    Non-medical: Not on file  Tobacco Use   Smoking status: Never Smoker   Smokeless tobacco: Never Used  Substance and Sexual Activity   Alcohol use: No   Drug use: No   Sexual activity: Yes    Birth control/protection: None, Surgical  Lifestyle   Physical activity    Days per week: Not on file    Minutes per session: Not on file   Stress: Not on file  Relationships   Social connections    Talks on phone: Not on file    Gets together: Not on file    Attends religious service: Not on file    Active member of club or organization: Not on file    Attends meetings of clubs or organizations: Not on file    Relationship status: Not on file   Intimate partner violence    Fear of current or ex partner: Not on file    Emotionally abused: Not on file    Physically  abused: Not on file    Forced sexual activity: Not on file  Other Topics Concern   Not on file  Social History Narrative   Patient lives at home with her spouse.   Caffeine Use: 2 sodas daily     PHYSICAL EXAM  Vitals:   09/22/18 0826  BP: 130/79  Pulse: 79  Temp: (!) 96.8 F (36 C)  Weight: 218 lb (98.9 kg)  Height: 5' 4.5" (1.638 m)    Body mass index is 36.84 kg/m.   General: The patient is well-developed and well-nourished and in no acute distress   Neurologic Exam  Mental status: The patient is alert and oriented x 3 at the time of the examination. The patient has apparent normal recent and remote memory, with an apparently normal attention span and concentration ability.   Speech is normal.  Cranial nerves: Extraocular movements are full.   Facial strength is normal.  Trapezius strength is normal.  No obvious hearing deficits are noted.  Motor:  Muscle bulk is normal.   Tone is normal. Strength is  5 / 5 in all 4 extremities.   Sensory: Normal sensation to touch x 4 (including right thigh)  Coordination: Cerebellar testing reveals good finger-nose-finger and heel-to-shin bilaterally.  Gait and station: Station is normal.   The gait is normal and the tandem gait is mildly wide.  Romberg is negative.   Reflexes: Deep tendon reflexes are normal and symmetric in the arms.  Deep tendon reflexes are mildly increased at the knees.   There is no ankle clonus.    DIAGNOSTIC DATA (LABS, IMAGING, TESTING) - I reviewed patient records, labs, notes, testing and imaging myself where available.  Lab Results  Component Value Date   WBC 11.9 (H) 02/19/2018   HGB 12.4 02/19/2018   HCT 38.0 02/19/2018   MCV 79 02/19/2018   PLT 297 02/19/2018      Component Value Date/Time   NA 143 11/12/2017 1154   NA 143 11/22/2013 1514  K 3.3 (L) 11/12/2017 1154   K 3.1 (L) 11/22/2013 1514   CL 102 11/12/2017 1154   CL 105 02/18/2012 1207   CO2 27 11/12/2017 1154   CO2 25  11/22/2013 1514   GLUCOSE 96 11/12/2017 1154   GLUCOSE 105 11/22/2013 1514   GLUCOSE 96 02/18/2012 1207   BUN 9 11/12/2017 1154   BUN 6.6 (L) 11/22/2013 1514   CREATININE 0.65 11/12/2017 1154   CREATININE 0.8 11/22/2013 1514   CALCIUM 8.8 11/12/2017 1154   CALCIUM 9.0 11/22/2013 1514   PROT 7.1 02/19/2018 0918   PROT 7.4 11/22/2013 1514   ALBUMIN 3.9 02/19/2018 0918   ALBUMIN 3.3 (L) 11/22/2013 1514   AST 20 02/19/2018 0918   AST 15 11/22/2013 1514   ALT 17 02/19/2018 0918   ALT 13 11/22/2013 1514   ALKPHOS 96 02/19/2018 0918   ALKPHOS 91 11/22/2013 1514   BILITOT 0.3 02/19/2018 0918   BILITOT 0.23 11/22/2013 1514   GFRNONAA 101 11/12/2017 1154   GFRAA 116 11/12/2017 1154       ASSESSMENT AND PLAN    1. Multiple sclerosis (HCC)   2. Leukocytosis   3. High risk medication use   4. Migraine without aura and without status migrainosus, not intractable   5. Numbness   6. Other fatigue       1. Continue Avonex.   Check CBC/D and LFT 2.  Continue other medications for MS symptoms..   3.  Try to stay active and exercises as tolerated. 4.  She will return to see me in 6 months or sooner if there are new or worsening neurologic symptoms.   Mekiah Wahler A. Felecia Shelling, MD, PhD 4/53/6468, 0:32 AM Certified in Neurology, Clinical Neurophysiology, Sleep Medicine, Pain Medicine and Neuroimaging  [facility] [address] [place],  [address] [phone]

## 2018-11-17 ENCOUNTER — Telehealth: Payer: Self-pay | Admitting: *Deleted

## 2018-11-17 NOTE — Telephone Encounter (Signed)
Faxed completed/signed PA Avonex back to [facility] at [phone]. Received fax confirmation. Waiting on determination. Marked urgent.

## 2018-11-24 NOTE — Telephone Encounter (Signed)
I fax PA form for Avonex and office notes again to [phone]. Forms fax twice and confirmed.

## 2018-11-24 NOTE — Telephone Encounter (Signed)
I called [facility] at  [phone] to state we receive a fax stating they have not receive the PA form or clinica documentation. I stated it was all fax by Laurence Ferrari on 11/17/2018. I verified their fax number was [phone]. She stated they do not have any information on the fax. I stated it will be fax again.

## 2018-12-01 ENCOUNTER — Encounter: Payer: Self-pay | Admitting: *Deleted

## 2018-12-01 NOTE — Telephone Encounter (Signed)
Received fax that PA denied. Will submit appeal letter to see if we can get Avonex approved. Waiting on MD signature and will then fax in.

## 2018-12-01 NOTE — Telephone Encounter (Signed)
Faxed signed appeal letter to [place] department at [phone]. Received fax confirmation, marked urgent. Waiting on determination.

## 2018-12-07 NOTE — Telephone Encounter (Signed)
Received fax notification from [facility] that appeal approved 11/03/2018-12/03/2019. PA# Dianna Rossetti [id number].

## 2019-01-14 ENCOUNTER — Other Ambulatory Visit: Payer: Self-pay

## 2019-01-14 DIAGNOSIS — Z20822 Contact with and (suspected) exposure to covid-19: Secondary | ICD-10-CM

## 2019-01-15 LAB — NOVEL CORONAVIRUS, NAA: SARS-CoV-2, NAA: NOT DETECTED

## 2019-03-25 ENCOUNTER — Encounter: Payer: Self-pay | Admitting: Neurology

## 2019-03-25 ENCOUNTER — Other Ambulatory Visit: Payer: Self-pay

## 2019-03-25 ENCOUNTER — Ambulatory Visit: Payer: BC Managed Care – PPO | Admitting: Neurology

## 2019-03-25 VITALS — BP 130/83 | HR 97 | Temp 97.0°F | Ht 64.5 in | Wt 223.0 lb

## 2019-03-25 DIAGNOSIS — E785 Hyperlipidemia, unspecified: Secondary | ICD-10-CM

## 2019-03-25 DIAGNOSIS — E559 Vitamin D deficiency, unspecified: Secondary | ICD-10-CM | POA: Diagnosis not present

## 2019-03-25 DIAGNOSIS — R200 Anesthesia of skin: Secondary | ICD-10-CM

## 2019-03-25 DIAGNOSIS — G35 Multiple sclerosis: Secondary | ICD-10-CM | POA: Diagnosis not present

## 2019-03-25 DIAGNOSIS — Z79899 Other long term (current) drug therapy: Secondary | ICD-10-CM | POA: Insufficient documentation

## 2019-03-25 NOTE — Progress Notes (Signed)
[facility]  PATIENT: Jacqueline Adams DOB: 05-Oct-1960  REFERRING DOCTOR OR PCP:  Rolan Lipa  _________________________________   HISTORICAL  CHIEF COMPLAINT:  Chief Complaint  Patient presents with  . Follow-up    Rm 12, alone. Last seen 09/22/2018. Doing well, no new sx.  . Multiple Sclerosis    On Avonex    HISTORY OF PRESENT ILLNESS:   Jacqueline Adams is a 58 y.o. woman with relapsing remitting MS diagnosed in 2009.    Update 03/25/2019: She is on Avonex and tolerates it well.    She is getting the drug through an assistance.  She tolerates it well.  She is walking well and can go up/down the stairs without the bannister.    She denies weakness in legs.   She has occasional tingling in hands.   She feels the lateral femoral cutaneous numbness has resolved.    Bladder is doing well.   Vision is fine.    Her sleep pattern is off with working at home.  She sometimes falls asleep after dinner through 11 pm and then is up a couple hours, falls back asleep an then wakes up at 530 am.   She is walking some everyday.   She denies depression.  Cognition is doing well.     Update 09/22/2018: She feels stable with no exacerbation.   She was able to get Avonex through patient assistance.  Injections go well without s.e.     She is walking well.    She denies difficulty with balance.    Strength is ok.  If she stands a long time, she gets tingling in the right anterolateral thigh.   No numbness elsewhere.  She reports more issues with headache.  She has some pain in the back of the head.  She can do her work from home with [employer].   She works with [employer].  She has mild fatigue .    She notes some stress and sleep is sometimes off but generally feels good and denies depression.   Cognition is fine.     Update 04/22/2018: She is on Avonex for her MS but insurance is not authorizing her for more.   She has about 8-10 more shots.     We have tried to get her  covered and appealed but the appeal was also turned down.     She had tried Betaseron in the past but had severe skin reactions.    She is concerned about akin reactions with Copaxone/glatopa.  She had stomach upset with Tecfidera.     She is concerned about hair loss with Aubagio.    She has recent pneumonia and is not a good candidate for Ocrevus or Gilenya/Mayzent.  Her MS appears stable and she has numbness in her hands which is old.   She denies new numbness.    Gait is doing ok.   She had a bad ankle strain playing a role in her mildly wide tandem gait.    Bladder function is doing well.     Update 02/19/2018: She feels her MS is stable.   No recent exacerbation.   She tolerates it well but her insurance refuses to cover Avonex (we appealed).   The list of preferred agents are: Aubagio, Betaseron, copaxone, Gilenya, glatiramer, Glatopa, Mayzent, Tecfidera, Tysabri.     She had tried and failed Tecfidera in the past due to stomach upset.      Gait is fine.   She notes  tingling in hr hands,       Bladder is doing well.   Fatigue is her main issue.   She has a severe sprain of the left ankle and is wearing a boot.    She is most interested in either Mayzent or Aubagio and we went over pros and cons of each.    She had an eye exam in November (goes every 6 months as she has borderline glaucoma) and was told everything was fine.     Update 11/12/2017: She feels the MS is mostly stable and she has no exacerbation.  She is on Avonex as her DMT.    She was recently diagnosed with pneumonia and is on Levaquin (completes today), prednisone and an inhaler.    She feels wiped out and went down to 1/2 days at work.     She also has more trouble with sleep, maintenance issues worse than onset insomnia.        Her gait is doing ok.   The right leg sometimes feels a little weaker.    She has some numbness and tingling in the hands.     She denies any current bladder issues.   Vision is doing well.   She has no  corrective lenses.   She denies depression or anxiety.     Cognition is fine.    She has some neck pain.   Flexeril helped with the pain but she is out.  Update 05/12/2017:   She remains on Avonex weekly for her multiple sclerosis.  She feels that she has been stable with no exacerbations recently.  She notes no significant injection reactions and gets mild flulike symptoms, helped by NSAIDs,for just one night after her shots.  Gait and balance are stable.  Occasionally she feels a little off balance but does not have any falls.  She is able to climb stairs.  She continues on Flexeril prn for right-sided muscle spasms.    No significant weakness or numbness.  She has some urinary frequency and nocturia but this is not bothering her too much.  No MS related visual problems.  She notes some fatigue that generally is worse in hot weather.  She sleeps well most nights but has nocturia waking her up about twice.  She usually falls back asleep easily.  She has some stress with her mom having health issues and she helps a lot.  This makes her more tired.    She denies any significant problems with mood disturbances.  Cognition is fine.  She also has migraine headaches.  These are doing better on Topamax.   She tolerates it well.   She has some tingling, better now than initially.  Update 11/11/2016:   She feels her MS has been stable. She takes Avonex weekly. She does not have any injection reactions. She does get mild flulike symptoms that night only, helped by NSAIDs.       She is noting no change in her gait. Balance is sometimes off but she could walk a mile without difficulty. She does not need support. She is having fewer of the right sided spasms that she did at the last visit. Flexeril has helped and she tolerates it well. She still has urinary frequency, usually 2-3 times a night. She falls back asleep quickly.. Urinary frequency is less bothersome during the daytime. Vision is fine. She does go to an  ophthalmologist regularly because she has borderline glaucoma.  She notes fatigue most days. This  is much worse with heat. She has done better the last 2 weeks she sleeps well most nights. Although she has nocturia she quickly falls back asleep. She does not note any significant problems with mood or cognition.  Headaches have done better on Topamax.  She has noted a little bit of numbness and tingling in the hands and toes but this is not bothering her much.   She had MRI of the brain and cervical spine 05/22/2016 were personally reviewed..   The MRI of the brain shows lesion consistent with MS. None of them enhanced. Compared to the 2017 MRI, there were no significant changes. The MRI of the cervical spine did not show any MS lesions.  _____________________________________________ From 05/09/2016  MS:   She feels that she has been stable on Avonex. In 2016 she switch back to Avonex from Tecfidera due to side effects. She has not had any exacerbations.  I have reviewed the MRI of the brain dated 05/14/2015. It shows T2/FLAIR hyperintense foci in the periventricular and juxtacortical white matter, mostly locatedin the left hemisphere. None of the foci appears to be acute. When compared to the MRI from 12/07/2013, there was no interval change.  Headaches:   She is having more pressure like headaches in her face near the sinuses.    These are distinct from her migraines.     Gait/strength/sensation:  She denies problems with her gait or with strength. She is noting spasms of the right arm that will last for about a minute and then relax again. With activities present she is clumsy in the arm. She also has noted this in her legs but it is less troublesome than in the arm.  At times, the right foot locks up and she stumbles.    She has mild right > left foot numbness and tingling, present x years.  Bladder/bowel:    She denies any urinary urgency, frequency or hesitancy.  She has 2-3  x nocturia but  quickly falls back asleep.   Frequency is not bad enough for her to want a medication.     She feels she empties well.   No UTI's    Npo bowel issues  Vision   She feels vision is good and she denies any MS related visual problems.  She has slightly elevate IOP but not bad enough to be called glaucoma and she is followed q 6 months for tonometry.   Eyes often itchy and she uses drops.    Fatigue/sleep:   She has fatigue that is 24/7 and is worse with heat..  She travels some for her job (did a lot in the spring but less for rest of year)  She also is going through menopause and has hot flashes.      She usually sleeps well but is doing worse with menopause.    Mood/Cognition:   She denies any depression or anxiety.    Last year was tougher with sister's death. She works as a Garment/textile technologist for [employer].    She denies any cognitive difficulty.       Neck Pain:    Neck pain bothers her more some days despite changing her workstation around to have the monitor at eye level.  It bothers her more when she has a headache. She has cervical disc degeneration.     Vit D was low in the past.    MS History:  In 2009, she had the onset of right arm and leg tingling and  clumsiness. At the time, she was seeing Dr. Earley Favor for migraines and he performed a nerve conduction study only showing mild carpal tunnel syndrome. Symptoms worsened the next day and she was referred to Dr. Erling Cruz. An MRI of the brain, cervical spine and a lumbar puncture was performed. The brain MRI showed a large left frontoparietal periventricular focus and several other foci. CSF was consistent with multiple sclerosis.  MRI of the cervical spine was normal. She was started on Avonex.   She was switched to Tecfidera in late 2014 as she had one new focus on her 2014 MRI. She switched back to Avonex in 2016.[name]   REVIEW OF SYSTEMS: Constitutional: No fevers, chills, sweats, or change in appetite.   She has fatigue Eyes: No visual changes, double  vision, eye pain.   Being followed for borderline high intraocular pressure. Ear, nose and throat: No hearing loss, ear pain/.    Respiratory: No shortness of breath at rest or with exertion.   No wheezes.   She has frequent bronchitis. Cardiovascular: No chest pain, palpitations GastrointestinaI: No nausea, vomiting, diarrhea, abdominal pain, fecal incontinence Genitourinary: No dysuria.  She has urgency and or frequency.  2-3 x  nocturia. Musculoskeletal: No neck pain, back pain Integumentary: No rash, pruritus, skin lesions Neurological: as above Psychiatric: No depression at this time.  No anxiety Endocrine: No palpitations, diaphoresis, change in appetite, change in weigh or increased thirst Hematologic/Lymphatic: No anemia, purpura, petechiae. Allergic/Immunologic: No itchy/runny eyes, nasal congestion, recent allergic reactions, rashes  ALLERGIES: Allergies  Allergen Reactions  . Procaine Swelling  . Relafen [Nabumetone] Swelling    HOME MEDICATIONS:  Current Outpatient Medications:  .  fluticasone (FLONASE) 50 MCG/ACT nasal spray, Place 2 sprays into both nostrils daily., Disp: 16 g, Rfl: 2 .  ibuprofen (ADVIL) 400 MG tablet, Take 1 tablet (400 mg total) by mouth every 8 (eight) hours as needed., Disp: 90 tablet, Rfl: 5 .  interferon beta-1a (AVONEX PREFILLED) 30 MCG/0.5ML PSKT injection, INJECT ONE SYRINGE INTRAMUSCULARLY ONCE WEEKLY. REFRIGERATE. PROTECT FROM LIGHT. ALLOW TO WARM TO ROOM TEMPERATURE PRIOR TO USE., Disp: 3 kit, Rfl: 3 .  Multiple Vitamins-Minerals (CENTRUM SILVER ADULT 50+ PO), Take by mouth., Disp: , Rfl:  .  simvastatin (ZOCOR) 20 MG tablet, Take 1 tablet by mouth daily., Disp: , Rfl:  .  VENTOLIN HFA 108 (90 BASE) MCG/ACT inhaler, Inhale 2 puffs into the lungs every 6 (six) hours as needed., Disp: , Rfl:  .  VITAMIN D PO, Take 2 tablets by mouth daily. +calcium, Disp: , Rfl:  .  topiramate (TOPAMAX) 25 MG tablet, Take 25 mg by mouth., Disp: , Rfl:  No  current facility-administered medications for this visit.  Facility-Administered Medications Ordered in Other Visits:  .  gadopentetate dimeglumine (MAGNEVIST) injection 20 mL, 20 mL, Intravenous, Once PRN, Kash Mothershead, Nanine Means, MD  PAST MEDICAL HISTORY: Past Medical History:  Diagnosis Date  . Bronchitis   . Cervical spondylarthritis   . Headache(784.0)   . High cholesterol   . Leukocytosis 2009  . Multiple sclerosis ([place]) 2009   followed by Dr. Erling Cruz  . Rhinitis   . Sinusitis     PAST SURGICAL HISTORY: Past Surgical History:  Procedure Laterality Date  . CESAREAN SECTION     x 3  . PARTIAL HYSTERECTOMY  2006   for uterine fibroid.     FAMILY HISTORY: Family History  Problem Relation Age of Onset  . Cancer Mother 31       ovarian cancer;  now with suspected breast cancer  . Diabetes Mother        also HTN  . Clotting disorder Father   . Breast cancer Maternal Aunt   . Cancer Maternal Grandmother        unknown female reproductive organ cancer  . Breast cancer Maternal Aunt   . Breast cancer Maternal Aunt   . Colon cancer Sister 19       anal cancer (2014)  . Lung cancer Sister 53  . Stroke Sister 68  . Ovarian cancer Sister 63  . BRCA 1/2 Sister     SOCIAL HISTORY:  Social History   Socioeconomic History  . Marital status: Married    Spouse name: [name]  . Number of children: 2  . Years of education: College  . Highest education level: Not on file  Occupational History  . Occupation: Print production planner: [employer]  Tobacco Use  . Smoking status: Never Smoker  . Smokeless tobacco: Never Used  Substance and Sexual Activity  . Alcohol use: No  . Drug use: No  . Sexual activity: Yes    Birth control/protection: None, Surgical  Other Topics Concern  . Not on file  Social History Narrative   Patient lives at home with her spouse.   Caffeine Use: 2 sodas daily   Social Determinants of Health   Financial Resource Strain:   . Difficulty of Paying  Living Expenses: Not on file  Food Insecurity:   . Worried About Charity fundraiser in the Last Year: Not on file  . Ran Out of Food in the Last Year: Not on file  Transportation Needs:   . Lack of Transportation (Medical): Not on file  . Lack of Transportation (Non-Medical): Not on file  Physical Activity:   . Days of Exercise per Week: Not on file  . Minutes of Exercise per Session: Not on file  Stress:   . Feeling of Stress : Not on file  Social Connections:   . Frequency of Communication with Friends and Family: Not on file  . Frequency of Social Gatherings with Friends and Family: Not on file  . Attends Religious Services: Not on file  . Active Member of Clubs or Organizations: Not on file  . Attends Archivist Meetings: Not on file  . Marital Status: Not on file  Intimate Partner Violence:   . Fear of Current or Ex-Partner: Not on file  . Emotionally Abused: Not on file  . Physically Abused: Not on file  . Sexually Abused: Not on file     PHYSICAL EXAM  Vitals:   03/25/19 0819  BP: 130/83  Pulse: 97  Temp: (!) 97 F (36.1 C)  Weight: 223 lb (101.2 kg)  Height: 5' 4.5" (1.638 m)    Body mass index is 37.69 kg/m.   General: The patient is well-developed and well-nourished and in no acute distress   Neurologic Exam  Mental status: The patient is alert and oriented x 3 at the time of the examination. The patient has apparent normal recent and remote memory, with an apparently normal attention span and concentration ability.   Speech is normal.  Cranial nerves: Extraocular movements are full.  Color vision is symmetric.   Facial strength is normal.  Trapezius strength is normal.  No obvious hearing deficits are noted.  Motor:  Muscle bulk is normal.   Tone is normal. Strength is  5 / 5 in all 4 extremities.   Sensory:  Normal sensation to touch and vibration in bilateral arms and legs.   Coordination: Cerebellar testing reveals good finger-nose-finger  and heel-to-shin bilaterally.  Gait and station: Station is normal.   The gait is normal and the tandem gait is mildly wide.  Romberg is negative.   Reflexes: Deep tendon reflexes are normal and symmetric in the arms.  Deep tendon reflexes are mildly increased at the knees.       DIAGNOSTIC DATA (LABS, IMAGING, TESTING) - I reviewed patient records, labs, notes, testing and imaging myself where available.  Lab Results  Component Value Date   WBC 11.9 (H) 02/19/2018   HGB 12.4 02/19/2018   HCT 38.0 02/19/2018   MCV 79 02/19/2018   PLT 297 02/19/2018      Component Value Date/Time   NA 143 11/12/2017 1154   NA 143 11/22/2013 1514   K 3.3 (L) 11/12/2017 1154   K 3.1 (L) 11/22/2013 1514   CL 102 11/12/2017 1154   CL 105 02/18/2012 1207   CO2 27 11/12/2017 1154   CO2 25 11/22/2013 1514   GLUCOSE 96 11/12/2017 1154   GLUCOSE 105 11/22/2013 1514   GLUCOSE 96 02/18/2012 1207   BUN 9 11/12/2017 1154   BUN 6.6 (L) 11/22/2013 1514   CREATININE 0.65 11/12/2017 1154   CREATININE 0.8 11/22/2013 1514   CALCIUM 8.8 11/12/2017 1154   CALCIUM 9.0 11/22/2013 1514   PROT 7.1 02/19/2018 0918   PROT 7.4 11/22/2013 1514   ALBUMIN 3.9 02/19/2018 0918   ALBUMIN 3.3 (L) 11/22/2013 1514   AST 20 02/19/2018 0918   AST 15 11/22/2013 1514   ALT 17 02/19/2018 0918   ALT 13 11/22/2013 1514   ALKPHOS 96 02/19/2018 0918   ALKPHOS 91 11/22/2013 1514   BILITOT 0.3 02/19/2018 0918   BILITOT 0.23 11/22/2013 1514   GFRNONAA 101 11/12/2017 1154   GFRAA 116 11/12/2017 1154       ASSESSMENT AND PLAN    1. Multiple sclerosis ([place])   2. High risk medication use   3. Numbness   4. Vitamin D deficiency   5. Hyperlipidemia, unspecified hyperlipidemia type       1. Continue Avonex.   Check CBC/D and CMP labs today.    We discussed getting an MRI later in the year to determine if any subclinical progression and considering a different medication if this is occurring.   2.  Continue vit D  supplementation, if low today increase amount.   3.  Try to stay active and exercises as tolerated. 4.  She will return to see [place] in 6 months or sooner if there are new or worsening neurologic symptoms.   Ethelean Colla A. Felecia Shelling, MD, PhD 3/79/0240, 9:73 AM Certified in Neurology, Clinical Neurophysiology, Sleep Medicine, Pain Medicine and Neuroimaging  [facility] [address], [place], [place] [address] [phone]

## 2019-03-26 ENCOUNTER — Other Ambulatory Visit: Payer: Self-pay | Admitting: Hematology and Oncology

## 2019-03-26 DIAGNOSIS — Z1231 Encounter for screening mammogram for malignant neoplasm of breast: Secondary | ICD-10-CM

## 2019-03-26 LAB — COMPREHENSIVE METABOLIC PANEL
ALT: 22 IU/L (ref 0–32)
AST: 21 IU/L (ref 0–40)
Albumin/Globulin Ratio: 1.4 (ref 1.2–2.2)
Albumin: 4 g/dL (ref 3.8–4.9)
Alkaline Phosphatase: 98 IU/L (ref 39–117)
BUN/Creatinine Ratio: 10 (ref 9–23)
BUN: 7 mg/dL (ref 6–24)
Bilirubin Total: 0.4 mg/dL (ref 0.0–1.2)
CO2: 24 mmol/L (ref 20–29)
Calcium: 9.2 mg/dL (ref 8.7–10.2)
Chloride: 104 mmol/L (ref 96–106)
Creatinine, Ser: 0.68 mg/dL (ref 0.57–1.00)
GFR calc Af Amer: 113 mL/min/{1.73_m2} (ref >59)
GFR calc non Af Amer: 98 mL/min/{1.73_m2} (ref >59)
Globulin, Total: 2.8 g/dL (ref 1.5–4.5)
Glucose: 103 mg/dL — ABNORMAL HIGH (ref 65–99)
Potassium: 3.5 mmol/L (ref 3.5–5.2)
Sodium: 143 mmol/L (ref 134–144)
Total Protein: 6.8 g/dL (ref 6.0–8.5)

## 2019-03-26 LAB — LIPID PANEL
Chol/HDL Ratio: 4.5 ratio — ABNORMAL HIGH (ref 0.0–4.4)
Cholesterol, Total: 194 mg/dL (ref 100–199)
HDL: 43 mg/dL (ref >39)
LDL Chol Calc (NIH): 133 mg/dL — ABNORMAL HIGH (ref 0–99)
Triglycerides: 99 mg/dL (ref 0–149)
VLDL Cholesterol Cal: 18 mg/dL (ref 5–40)

## 2019-03-26 LAB — CBC WITH DIFFERENTIAL/PLATELET
Basophils Absolute: 0.1 10*3/uL (ref 0.0–0.2)
Basos: 0 %
EOS (ABSOLUTE): 0.1 10*3/uL (ref 0.0–0.4)
Eos: 1 %
Hematocrit: 36.9 % (ref 34.0–46.6)
Hemoglobin: 12.1 g/dL (ref 11.1–15.9)
Immature Grans (Abs): 0 10*3/uL (ref 0.0–0.1)
Immature Granulocytes: 0 %
Lymphocytes Absolute: 3.3 10*3/uL — ABNORMAL HIGH (ref 0.7–3.1)
Lymphs: 28 %
MCH: 25.7 pg — ABNORMAL LOW (ref 26.6–33.0)
MCHC: 32.8 g/dL (ref 31.5–35.7)
MCV: 79 fL (ref 79–97)
Monocytes Absolute: 0.6 10*3/uL (ref 0.1–0.9)
Monocytes: 5 %
Neutrophils Absolute: 7.6 10*3/uL — ABNORMAL HIGH (ref 1.4–7.0)
Neutrophils: 66 %
Platelets: 295 10*3/uL (ref 150–450)
RBC: 4.7 x10E6/uL (ref 3.77–5.28)
RDW: 15.6 % — ABNORMAL HIGH (ref 11.7–15.4)
WBC: 11.7 10*3/uL — ABNORMAL HIGH (ref 3.4–10.8)

## 2019-03-26 LAB — VITAMIN D 25 HYDROXY (VIT D DEFICIENCY, FRACTURES): Vit D, 25-Hydroxy: 33.2 ng/mL (ref 30.0–100.0)

## 2019-03-26 LAB — TSH: TSH: 1.21 u[IU]/mL (ref 0.450–4.500)

## 2019-03-31 ENCOUNTER — Ambulatory Visit
Admission: RE | Admit: 2019-03-31 | Discharge: 2019-03-31 | Disposition: A | Discharge status: Home / Self Care | Payer: BC Managed Care – PPO | Source: Ambulatory Visit | Attending: Hematology and Oncology | Admitting: Hematology and Oncology

## 2019-03-31 ENCOUNTER — Other Ambulatory Visit: Payer: Self-pay

## 2019-03-31 DIAGNOSIS — Z1231 Encounter for screening mammogram for malignant neoplasm of breast: Secondary | ICD-10-CM

## 2019-05-27 ENCOUNTER — Telehealth: Payer: Self-pay | Admitting: Neurology

## 2019-05-27 DIAGNOSIS — G35 Multiple sclerosis: Secondary | ICD-10-CM

## 2019-05-27 MED ORDER — AVONEX PREFILLED 30 MCG/0.5ML IM PSKT: PREFILLED_SYRINGE | INTRAMUSCULAR | 3 refills | Status: DC

## 2019-05-27 NOTE — Telephone Encounter (Signed)
Pt called to inform she is needing her avonex sent to new pharmacy  [facility]  [phone]

## 2019-05-27 NOTE — Telephone Encounter (Signed)
E-sribed refill to [facility] as requested.

## 2019-06-14 ENCOUNTER — Telehealth: Payer: Self-pay | Admitting: Neurology

## 2019-06-14 NOTE — Telephone Encounter (Signed)
I called pt back. Instructed her that she should f/u with PCP first. She verbalized understanding and appreciation for call.

## 2019-06-14 NOTE — Telephone Encounter (Signed)
Patient called stating she has gotten her second covid shot and is not feeling well. Pt states she is exhausted, body aches, headaches, weakness and had a fever friday and would like a CB to discuss

## 2019-09-21 NOTE — Patient Instructions (Addendum)
We will continue current treatment regimen. I will update labs today.   Consider melatonin or valerian root  Try to stay active. Focus on healthy lifestyle habits with regular sleep patters, adequate hydration and well balanced diet.   Follow up in 6 months   Melatonin oral solid dosage forms (Typical dose is 3-5mg  every night)  What is this medicine? MELATONIN (mel uh TOH nin) is a dietary supplement. It is mostly promoted to help maintain normal sleep patterns. The FDA has not approved this supplement for any medical use. This supplement may be used for other purposes; ask your health care provider or pharmacist if you have questions. This medicine may be used for other purposes; ask your health care provider or pharmacist if you have questions. COMMON BRAND NAME(S): Melatonex What should I tell my health care provider before I take this medicine? They need to know if you have any of these conditions:  cancer  depression or mental illness  diabetes  hormone problems  if you often drink alcohol  immune system problems  liver disease  lung or breathing disease, like asthma  organ transplant  seizure disorder  an unusual or allergic reaction to melatonin, other medicines, foods, dyes, or preservatives  pregnant or trying to get pregnant  breast-feeding How should I use this medicine? Take this supplement by mouth with a glass of water. Do not take with food. This supplement is usually taken 1 or 2 hours before bedtime. After taking this supplement, limit your activities to those needed to prepare for bed. Some products may be chewed or dissolved in the mouth before swallowing. Some tablets or capsules must be swallowed whole; do not cut, crush or chew. Follow the directions on the package labeling, or take as directed by your health care professional. Do not take this supplement more often than directed. Talk to your pediatrician regarding the use of this supplement in  children. Special care may be needed. This supplement is not recommended for use in children without a prescription. Overdosage: If you think you have taken too much of this medicine contact a poison control center or emergency room at once. NOTE: This medicine is only for you. Do not share this medicine with others. What if I miss a dose? If you miss taking your dose at the usual time, skip that dose. If it is almost time for your next dose, take only that dose. Do not take double or extra doses. What may interact with this medicine? Do not take this medicine with any of the following medications:  fluvoxamine  ramelteon  tasimelteon This medicine may also interact with the following medications:  alcohol  caffeine  carbamazepine  certain antibiotics like ciprofloxacin  certain medicines for depression, anxiety, or psychotic disturbances  cimetidine  female hormones, like estrogens and birth control pills, patches, rings, or injections  methoxsalen  nifedipine  other medications for sleep  other herbal or dietary supplements  phenobarbital  rifampin  smoking tobacco  tamoxifen  warfarin This list may not describe all possible interactions. Give your health care provider a list of all the medicines, herbs, non-prescription drugs, or dietary supplements you use. Also tell them if you smoke, drink alcohol, or use illegal drugs. Some items may interact with your medicine. What should I watch for while using this medicine? See your doctor if your symptoms do not get better or if they get worse. Do not take this supplement for more than 2 weeks unless your doctor tells you  to. You may get drowsy or dizzy. Do not drive, use machinery, or do anything that needs mental alertness until you know how this medicine affects you. Do not stand or sit up quickly, especially if you are an older patient. This reduces the risk of dizzy or fainting spells. Alcohol may interfere with the  effect of this medicine. Avoid alcoholic drinks. After taking this medicine, you may get up out of bed and do an activity that you do not know you are doing. The next morning, you may have no memory of this. Activities include driving a car ("sleep-driving"), making and eating food, talking on the phone, sexual activity, and sleep-walking. Serious injuries have occurred. Call your doctor right away if you find out you have done any of these activities. Do not take this medicine if you have used alcohol that evening. Do not take it if you have taken another medicine for sleep. The risk of doing these sleep-related activities is higher. Talk to your doctor before you use this supplement if you are currently being treated for an emotional, mental, or sleep problem. This medicine may interfere with your treatment. Herbal or dietary supplements are not regulated like medicines. Rigid quality control standards are not required for dietary supplements. The purity and strength of these products can vary. The safety and effect of this dietary supplement for a certain disease or illness is not well known. This product is not intended to diagnose, treat, cure or prevent any disease. The Food and Drug Administration suggests the following to help consumers protect themselves:  Always read product labels and follow directions.  Natural does not mean a product is safe for humans to take.  Look for products that include USP after the ingredient name. This means that the manufacturer followed the standards of the [place] Pharmacopoeia.  Supplements made or sold by a nationally known food or drug company are more likely to be made under tight controls. You can write to the company for more information about how the product was made. What side effects may I notice from receiving this medicine? Side effects that you should report to your doctor or health care professional as soon as possible:  allergic reactions like skin  rash, itching or hives, swelling of the face, lips, or tongue  breathing problems  confusion  depressed mood, irritable, or other changes in moods or behaviors  feeling faint or lightheaded, falls  increased blood pressure  irregular or missed menstrual periods  signs and symptoms of liver injury like dark yellow or brown urine; general ill feeling or flu-like symptoms; light-colored stools; loss of appetite; nausea; right upper belly pain; unusually weak or tired; yellowing of the eyes or skin  trouble staying awake or alert during the day  unusual activities while you are still asleep like driving, eating, making phone calls  unusual bleeding or bruising Side effects that usually do not require medical attention (report to your doctor or health care professional if they continue or are bothersome):  dizziness  drowsiness  headache  hot flashes  nausea  tiredness  unusual dreams or nightmares  upset stomach This list may not describe all possible side effects. Call your doctor for medical advice about side effects. You may report side effects to FDA at 1-800-FDA-1088. Where should I keep my medicine? Keep out of the reach of children. Store at room temperature or as directed on the package label. Protect from moisture. Throw away any unused supplement after the expiration date. NOTE:  This sheet is a summary. It may not cover all possible information. If you have questions about this medicine, talk to your doctor, pharmacist, or health care provider.  2020 Elsevier/Gold Standard (2017-07-25 12:11:58)   Nonah Mattes officinalis oral dosage forms What is this medicine? VALERIAN (vuh LEER ee uhn) is an herbal or dietary supplement. It is promoted to help relaxation, sleep and stress. The FDA has not approved this supplement for any medical use. This supplement may be used for other purposes; ask your health care provider or pharmacist if you have questions. This  medicine may be used for other purposes; ask your health care provider or pharmacist if you have questions. What should I tell my health care provider before I take this medicine? They need to know if you have any of these conditions:  drug abuse or addiction  emotional illness like anxiety, depression  heart disease  kidney disease  liver disease  if you frequently drink alcohol containing drinks  sleeping problems  an unusual or allergic reaction to valerian, herbs, plants, other medicines, foods, dyes, or preservatives  pregnant or trying to get pregnant  breast-feeding How should I use this medicine? Take this supplement by mouth with a glass of water. Follow the directions on the package labeling, or take as directed by your health care professional. If this supplement upsets your stomach, take it with food. Do not take this supplement more often than directed. Contact your pediatrician regarding the use of this supplement in children. Special care may be needed. Overdosage: If you think you have taken too much of this medicine contact a poison control center or emergency room at once. NOTE: This medicine is only for you. Do not share this medicine with others. What if I miss a dose? If you miss a dose, take it as soon as you can. If it is almost time for your next dose, take only that dose. Do not take double or extra doses. What may interact with this medicine? Check with your doctor or healthcare professional if you are taking any of the following medications:  alcohol  barbiturate medicines for sleep or seizures  medicines for depression, anxiety, or psychotic disturbances  medicines for sleep  muscle relaxants  narcotic pain medicines This list may not describe all possible interactions. Give your health care provider a list of all the medicines, herbs, non-prescription drugs, or dietary supplements you use. Also tell them if you smoke, drink alcohol, or use  illegal drugs. Some items may interact with your medicine. What should I watch for while using this medicine? See your doctor if your symptoms do not get better or if they get worse. You may get drowsy or dizzy. Do not drive, use machinery, or do anything that needs mental alertness until you know how this medicine affects you. Do not stand or sit up quickly, especially if you are an older patient. This reduces the risk of dizzy or fainting spells. Alcohol may interfere with the effect of this medicine. If you are scheduled for any medical or dental procedure, tell your healthcare provider that you are taking this supplement. You may need to stop taking this supplement before the procedure. Herbal or dietary supplements are not regulated like medicines. Rigid quality control standards are not required for dietary supplements. The purity and strength of these products can vary. The safety and effect of this dietary supplement for a certain disease or illness is not well known. This product is not intended to diagnose,  treat, cure or prevent any disease. The Food and Drug Administration suggests the following to help consumers protect themselves:  Always read product labels and follow directions.  Natural does not mean a product is safe for humans to take.  Look for products that include USP after the ingredient name. This means that the manufacturer followed the standards of the [place] Pharmacopoeia.  Supplements made or sold by a nationally known food or drug company are more likely to be made under tight controls. You can write to the company for more information about how the product was made. What side effects may I notice from receiving this medicine? Side effects that you should report to your doctor or health care professional as soon as possible:  allergic reactions like skin rash, itching or hives, swelling of the face, lips, or tongue  breathing problems  changes in emotions or moods, like  depressed mood  confused, forgetful  dark urine  fast, irregular heartbeat  problems with balance, talking, walking  unusually weak or tired  yellowing of the eyes, skin Side effects that usually do not require medical attention (report to your doctor or health care professional if they continue or are bothersome):  stomach upset  dizziness  tiredness This list may not describe all possible side effects. Call your doctor for medical advice about side effects. You may report side effects to FDA at 1-800-FDA-1088. Where should I keep my medicine? Keep out of the reach of children. Store at room temperature or as directed on the package label. Protect from moisture. Throw away any unused supplement after the expiration date. NOTE: This sheet is a summary. It may not cover all possible information. If you have questions about this medicine, talk to your doctor, pharmacist, or health care provider.  2020 Elsevier/Gold Standard (2007-10-26 17:33:52)    Multiple Sclerosis Multiple sclerosis (MS) is a disease of the brain, spinal cord, and optic nerves (central nervous system). It causes the body's disease-fighting (immune) system to destroy the protective covering (myelin sheath) around nerves in the brain. When this happens, signals (nerve impulses) going to and from the brain and spinal cord do not get sent properly or may not get sent at all. There are several types of MS:  Relapsing-remitting MS. This is the most common type. This causes sudden attacks of symptoms. After an attack, you may recover completely until the next attack, or some symptoms may remain permanently.  Secondary progressive MS. This usually develops after the onset of relapsing-remitting MS. Similar to relapsing-remitting MS, this type also causes sudden attacks of symptoms. Attacks may be less frequent, but symptoms slowly get worse (progress) over time.  Primary progressive MS. This causes symptoms that  steadily progress over time. This type of MS does not cause sudden attacks of symptoms. The age of onset of MS varies, but it often develops between 65-57 years of age. MS is a lifelong (chronic) condition. There is no cure, but treatment can help slow down the progression of the disease. What are the causes? The cause of this condition is not known. What increases the risk? You are more likely to develop this condition if:  You are a woman.  You have a relative with MS. However, the condition is not passed from parent to child (inherited).  You have a lack (deficiency) of vitamin D.  You smoke. MS is more common in the [place] than in the [place]. What are the signs or symptoms? Relapsing-remitting and secondary  progressive MS cause symptoms to occur in episodes or attacks that may last weeks to months. There may be long periods between attacks in which there are almost no symptoms. Primary progressive MS causes symptoms to steadily progress after they develop. Symptoms of MS vary because of the many different ways it affects the central nervous system. The main symptoms include:  Vision problems and eye pain.  Numbness.  Weakness.  Inability to move your arms, hands, feet, or legs (paralysis).  Balance problems.  Shaking that you cannot control (tremors).  Muscle spasms.  Problems with thinking (cognitive changes). MS can also cause symptoms that are associated with the disease, but are not always the direct result of an MS attack. They may include:  Inability to control urination or bowel movements (incontinence).  Headaches.  Fatigue.  Inability to tolerate heat.  Emotional changes.  Depression.  Pain. How is this diagnosed? This condition is diagnosed based on:  Your symptoms.  A neurological exam. This involves checking central nervous system function, such as nerve function, reflexes, and coordination.  MRIs of the brain and  spinal cord.  Lab tests, including a lumbar puncture that tests the fluid that surrounds the brain and spinal cord (cerebrospinal fluid).  Tests to measure the electrical activity of the brain in response to stimulation (evoked potentials). How is this treated? There is no cure for MS, but medicines can help decrease the number and frequency of attacks and help relieve nuisance symptoms. Treatment options may include:  Medicines that reduce the frequency of attacks. These medicines may be given by injection, by mouth (orally), or through an IV.  Medicines that reduce inflammation (steroids). These may provide short-term relief of symptoms.  Medicines to help control pain, depression, fatigue, or incontinence.  Vitamin D, if you have a deficiency.  Using devices to help you move around (assistive devices), such as braces, a cane, or a walker.  Physical therapy to strengthen and stretch your muscles.  Occupational therapy to help you with everyday tasks.  Alternative or complementary treatments such as exercise, massage, or acupuncture. Follow these instructions at home:  Take over-the-counter and prescription medicines only as told by your health care provider.  Do not drive or use heavy machinery while taking prescription pain medicine.  Use assistive devices as recommended by your physical therapist or your health care provider.  Exercise as directed by your health care provider.  Return to your normal activities as told by your health care provider. Ask your health care provider what activities are safe for you.  Reach out for support. Share your feelings with friends, family, or a support group.  Keep all follow-up visits as told by your health care provider and therapists. This is important. Where to find more information  National Multiple Sclerosis Society: https://www.nationalmssociety.org Contact a health care provider if:  You feel depressed.  You develop new  pain or numbness.  You have tremors.  You have problems with sexual function. Get help right away if:  You develop paralysis.  You develop numbness.  You have problems with your bladder or bowel function.  You develop double vision.  You lose vision in one or both eyes.  You develop suicidal thoughts.  You develop severe confusion. If you ever feel like you may hurt yourself or others, or have thoughts about taking your own life, get help right away. You can go to your nearest emergency department or call:  Your local emergency services (911 in the [place]  A  suicide crisis helpline, such as the [facility] at (979) 812-2203. This is open 24 hours a day. Summary  Multiple sclerosis (MS) is a disease of the central nervous system that causes the body's immune system to destroy the protective covering (myelin sheath) around nerves in the brain.  There are 3 types of MS: relapsing-remitting, secondary progressive, and primary progressive. Relapsing-remitting and secondary progressive MS cause symptoms to occur in episodes or attacks that may last weeks to months. Primary progressive MS causes symptoms to steadily progress after they develop.  There is no cure for MS, but medicines can help decrease the number and frequency of attacks and help relieve nuisance symptoms. Treatment may also include physical or occupational therapy.  If you develop numbness, paralysis, vision problems, or other neurological symptoms, get help right away. This information is not intended to replace advice given to you by your health care provider. Make sure you discuss any questions you have with your health care provider. Document Revised: 01/10/2017 Document Reviewed: 04/08/2016 Elsevier Patient Education  2020 [employer].

## 2019-09-21 NOTE — Progress Notes (Signed)
PATIENT: Jacqueline Adams DOB: [birth year]  REASON FOR VISIT: follow up HISTORY FROM: patient  Chief Complaint  Patient presents with   Follow-up    rm 7 here for a f/u on MS. Pt said she is havinf the same sx of tired, fatigue, tingling.     HISTORY OF PRESENT ILLNESS: Today 09/22/19 Jacqueline Adams is a 59 y.o. female here today for follow up for RRMS. She continues Avonex. Labs were stable in 03/2019. Last MRI stable in 04/2018.   Overall she is doing well. She continues to have chronic fatigue and dysesthesias (mostly in hands and feet). She is taking topiramate 38m BID for headache management and feels it works well. She continues vitamin D OTC.   She does sleep well. 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 She tries to go to bed around the same time but wakes in the middle of the night and can't get back to sleep. She wakes up almost every day around 5:30. She tries to walk around the house every day but has not been able to get outside due to heat. She drinks plenty of water. She denies anxiety or depression.    HISTORY: (copied from Dr SGarth Bignessnote on 03/25/2019)  KHanalei Glaceis a 59y.o. woman with relapsing remitting MS diagnosed in 2009.    Update 03/25/2019: She is on Avonex and tolerates it well.    She is getting the drug through an assistance.  She tolerates it well.  She is walking well and can go up/down the stairs without the bannister.    She denies weakness in legs.   She has occasional tingling in hands.   She feels the lateral femoral cutaneous numbness has resolved.    Bladder is doing well.   Vision is fine.    Her sleep pattern is off with working at home.  She sometimes falls asleep after dinner through 11 pm and then is up a couple hours, falls back asleep an then wakes up at 530 am.   She is walking some everyday.   She denies depression.  Cognition is doing  well.     Update 09/22/2018: She feels stable with no exacerbation.   She was able to get Avonex through patient assistance.  Injections go well without s.e.     She is walking well.    She denies difficulty with balance.    Strength is ok.  If she stands a long time, she gets tingling in the right anterolateral thigh.   No numbness elsewhere.  She reports more issues with headache.  She has some pain in the back of the head.  She can do her work from home with [employer]   She works with [employer]  She has mild fatigue .    She notes some stress and sleep is sometimes off but generally feels good and denies depression.   Cognition is fine.     Update 04/22/2018: She is on Avonex for her MS but insurance is not authorizing her for more.   She has about 8-10 more shots.     We have tried to get her covered and appealed but the appeal was also turned down.     She had tried Betaseron in the past but had severe skin reactions.    She is concerned about akin reactions with Copaxone/glatopa.  She had stomach upset with  Tecfidera.     She is concerned about hair loss with Aubagio.    She has recent pneumonia and is not a good candidate for Ocrevus or Gilenya/Mayzent.  Her MS appears stable and she has numbness in her hands which is old.   She denies new numbness.    Gait is doing ok.   She had a bad ankle strain playing a role in her mildly wide tandem gait.    Bladder function is doing well.     Update 02/19/2018: She feels her MS is stable.   No recent exacerbation.   She tolerates it well but her insurance refuses to cover Avonex (we appealed).   The list of preferred agents are: Aubagio, Betaseron, copaxone, Gilenya, glatiramer, Glatopa, Mayzent, Tecfidera, Tysabri.     She had tried and failed Tecfidera in the past due to stomach upset.      Gait is fine.   She notes tingling in hr hands,       Bladder is doing well.   Fatigue is her main issue.   She has a severe sprain of the left ankle and is  wearing a boot.    She is most interested in either Mayzent or Aubagio and we went over pros and cons of each.    She had an eye exam in November (goes every 6 months as she has borderline glaucoma) and was told everything was fine.     Update 11/12/2017: She feels the MS is mostly stable and she has no exacerbation.  She is on Avonex as her DMT.    She was recently diagnosed with pneumonia and is on Levaquin (completes today), prednisone and an inhaler.    She feels wiped out and went down to 1/2 days at work.     She also has more trouble with sleep, maintenance issues worse than onset insomnia.        Her gait is doing ok.   The right leg sometimes feels a little weaker.    She has some numbness and tingling in the hands.     She denies any current bladder issues.   Vision is doing well.   She has no corrective lenses.   She denies depression or anxiety.     Cognition is fine.    She has some neck pain.   Flexeril helped with the pain but she is out.  Update 05/12/2017:   She remains on Avonex weekly for her multiple sclerosis.  She feels that she has been stable with no exacerbations recently.  She notes no significant injection reactions and gets mild flulike symptoms, helped by NSAIDs,for just one night after her shots.  Gait and balance are stable.  Occasionally she feels a little off balance but does not have any falls.  She is able to climb stairs.  She continues on Flexeril prn for right-sided muscle spasms.    No significant weakness or numbness.  She has some urinary frequency and nocturia but this is not bothering her too much.  No MS related visual problems.  She notes some fatigue that generally is worse in hot weather.  She sleeps well most nights but has nocturia waking her up about twice.  She usually falls back asleep easily.  She has some stress with her mom having health issues and she helps a lot.  This makes her more tired.    She denies any significant problems with mood  disturbances.  Cognition is fine.  She also  has migraine headaches.  These are doing better on Topamax.   She tolerates it well.   She has some tingling, better now than initially.  Update 11/11/2016:   She feels her MS has been stable. She takes Avonex weekly. She does not have any injection reactions. She does get mild flulike symptoms that night only, helped by NSAIDs.       She is noting no change in her gait. Balance is sometimes off but she could walk a mile without difficulty. She does not need support. She is having fewer of the right sided spasms that she did at the last visit. Flexeril has helped and she tolerates it well. She still has urinary frequency, usually 2-3 times a night. She falls back asleep quickly.. Urinary frequency is less bothersome during the daytime. Vision is fine. She does go to an ophthalmologist regularly because she has borderline glaucoma.  She notes fatigue most days. This is much worse with heat. She has done better the last 2 weeks she sleeps well most nights. Although she has nocturia she quickly falls back asleep. She does not note any significant problems with mood or cognition.  Headaches have done better on Topamax.  She has noted a little bit of numbness and tingling in the hands and toes but this is not bothering her much.   She had MRI of the brain and cervical spine 05/22/2016 were personally reviewed..   The MRI of the brain shows lesion consistent with MS. None of them enhanced. Compared to the 2017 MRI, there were no significant changes. The MRI of the cervical spine did not show any MS lesions.  _____________________________________________ From 05/09/2016  MS:   She feels that she has been stable on Avonex. In 2016 she switch back to Avonex from Tecfidera due to side effects. She has not had any exacerbations.  I have reviewed the MRI of the brain dated 05/14/2015. It shows T2/FLAIR hyperintense foci in the periventricular and juxtacortical  white matter, mostly locatedin the left hemisphere. None of the foci appears to be acute. When compared to the MRI from 12/07/2013, there was no interval change.  Headaches:   She is having more pressure like headaches in her face near the sinuses.    These are distinct from her migraines.     Gait/strength/sensation:  She denies problems with her gait or with strength. She is noting spasms of the right arm that will last for about a minute and then relax again. With activities present she is clumsy in the arm. She also has noted this in her legs but it is less troublesome than in the arm.  At times, the right foot locks up and she stumbles.    She has mild right > left foot numbness and tingling, present x years.  Bladder/bowel:    She denies any urinary urgency, frequency or hesitancy.  She has 2-3  x nocturia but quickly falls back asleep.   Frequency is not bad enough for her to want a medication.     She feels she empties well.   No UTI's    Npo bowel issues  Vision   She feels vision is good and she denies any MS related visual problems.  She has slightly elevate IOP but not bad enough to be called glaucoma and she is followed q 6 months for tonometry.   Eyes often itchy and she uses drops.    Fatigue/sleep:   She has fatigue that is 24/7 and is  worse with heat..  She travels some for her job (did a lot in the spring but less for rest of year)  She also is going through menopause and has hot flashes.      She usually sleeps well but is doing worse with menopause.    Mood/Cognition:   She denies any depression or anxiety.    Last year was tougher with sister's death. She works as a Garment/textile technologist for [employer].    She denies any cognitive difficulty.       Neck Pain:    Neck pain bothers her more some days despite changing her workstation around to have the monitor at eye level.  It bothers her more when she has a headache. She has cervical disc degeneration.     Vit D was low in the  past.    MS History:  In 2009, she had the onset of right arm and leg tingling and clumsiness. At the time, she was seeing Dr. Earley Favor for migraines and he performed a nerve conduction study only showing mild carpal tunnel syndrome. Symptoms worsened the next day and she was referred to Dr. Erling Cruz. An MRI of the brain, cervical spine and a lumbar puncture was performed. The brain MRI showed a large left frontoparietal periventricular focus and several other foci. CSF was consistent with multiple sclerosis.  MRI of the cervical spine was normal. She was started on Avonex.   She was switched to Tecfidera in late 2014 as she had one new focus on her 2014 MRI. She switched back to Avonex in 2016.[name]   REVIEW OF SYSTEMS: Out of a complete 14 system review of symptoms, the patient complains only of the following symptoms, chronic fatigue, dysesthesias, headaches and all other reviewed systems are negative.  ALLERGIES: Allergies  Allergen Reactions   Procaine Swelling   Relafen [Nabumetone] Swelling    HOME MEDICATIONS: Outpatient Medications Prior to Visit  Medication Sig Dispense Refill   fluticasone (FLONASE) 50 MCG/ACT nasal spray Place 2 sprays into both nostrils daily. 16 g 2   ibuprofen (ADVIL) 400 MG tablet Take 1 tablet (400 mg total) by mouth every 8 (eight) hours as needed. 90 tablet 5   interferon beta-1a (AVONEX PREFILLED) 30 MCG/0.5ML PSKT injection INJECT ONE SYRINGE INTRAMUSCULARLY ONCE WEEKLY. REFRIGERATE. PROTECT FROM LIGHT. ALLOW TO WARM TO ROOM TEMPERATURE PRIOR TO USE. 3 each 3   Multiple Vitamins-Minerals (CENTRUM SILVER ADULT 50+ PO) Take by mouth.     simvastatin (ZOCOR) 20 MG tablet Take 1 tablet by mouth daily.     VENTOLIN HFA 108 (90 BASE) MCG/ACT inhaler Inhale 2 puffs into the lungs every 6 (six) hours as needed.     VITAMIN D PO Take 2 tablets by mouth daily. +calcium     topiramate (TOPAMAX) 25 MG tablet Take 25 mg by mouth.     gadopentetate dimeglumine  (MAGNEVIST) injection 20 mL      No facility-administered medications prior to visit.    PAST MEDICAL HISTORY: Past Medical History:  Diagnosis Date   Bronchitis    Cervical spondylarthritis    Headache(784.0)    High cholesterol    Leukocytosis 2009   Multiple sclerosis ([place]) 2009   followed by Dr. Erling Cruz   Rhinitis    Sinusitis     PAST SURGICAL HISTORY: Past Surgical History:  Procedure Laterality Date   CESAREAN SECTION     x 3   PARTIAL HYSTERECTOMY  2006   for uterine fibroid.  FAMILY HISTORY: Family History  Problem Relation Age of Onset   Cancer Mother 65       ovarian cancer; now with suspected breast cancer   Diabetes Mother        also HTN   Breast cancer Mother    Clotting disorder Father    Breast cancer Maternal Aunt    Cancer Maternal Grandmother        unknown female reproductive organ cancer   Breast cancer Maternal Aunt    Breast cancer Maternal Aunt    Colon cancer Sister 14       anal cancer (2014)   Lung cancer Sister 82   Stroke Sister 52   Ovarian cancer Sister 72   BRCA 1/2 Sister     SOCIAL HISTORY: Social History   Socioeconomic History   Marital status: Married    Spouse name: John   Number of children: 2   Years of education: Engineer, agricultural education level: Not on file  Occupational History   Occupation: Print production planner: [employer]  Tobacco Use   Smoking status: Never Smoker   Smokeless tobacco: Never Used  Scientific laboratory technician Use: Never used  Substance and Sexual Activity   Alcohol use: No   Drug use: No   Sexual activity: Yes    Birth control/protection: None, Surgical  Other Topics Concern   Not on file  Social History Narrative   Patient lives at home with her spouse.   Caffeine Use: 2 sodas daily   Social Determinants of Health   Financial Resource Strain:    Difficulty of Paying Living Expenses:   Food Insecurity:    Worried About Charity fundraiser in  the Last Year:    Arboriculturist in the Last Year:   Transportation Needs:    Film/video editor (Medical):    Lack of Transportation (Non-Medical):   Physical Activity:    Days of Exercise per Week:    Minutes of Exercise per Session:   Stress:    Feeling of Stress :   Social Connections:    Frequency of Communication with Friends and Family:    Frequency of Social Gatherings with Friends and Family:    Attends Religious Services:    Active Member of Clubs or Organizations:    Attends Archivist Meetings:    Marital Status:   Intimate Partner Violence:    Fear of Current or Ex-Partner:    Emotionally Abused:    Physically Abused:    Sexually Abused:       PHYSICAL EXAM  Vitals:   09/22/19 0808  BP: 126/72  Pulse: 82  Weight: 218 lb (98.9 kg)  Height: _0  (1.626 m)   Body mass index is 37.42 kg/m.  Generalized: Well developed, in no acute distress  Cardiology: normal rate and rhythm, no murmur noted Respiratory: clear to auscultation bilaterally  Neurological examination  Mentation: Alert oriented to time, place, history taking. Follows all commands speech and language fluent Cranial nerve II-XII: Pupils were equal round reactive to light. Extraocular movements were full, visual field were full on confrontational test. Facial sensation and strength were normal. Uvula tongue midline. Head turning and shoulder shrug  were normal and symmetric. Motor: The motor testing reveals 5 over 5 strength of all 4 extremities. Good symmetric motor tone is noted throughout.  Sensory: Sensory testing is intact to soft touch on all 4 extremities. No evidence of extinction is  noted.  Coordination: Cerebellar testing reveals good finger-nose-finger and heel-to-shin bilaterally.  Gait and station: Gait is normal.   Reflexes: Deep tendon reflexes are symmetric and normal bilaterally.   DIAGNOSTIC DATA (LABS, IMAGING, TESTING) - I reviewed patient  records, labs, notes, testing and imaging myself where available.  No flowsheet data found.   Lab Results  Component Value Date   WBC 11.7 (H) 03/25/2019   HGB 12.1 03/25/2019   HCT 36.9 03/25/2019   MCV 79 03/25/2019   PLT 295 03/25/2019      Component Value Date/Time   NA 143 03/25/2019 0901   NA 143 11/22/2013 1514   K 3.5 03/25/2019 0901   K 3.1 (L) 11/22/2013 1514   CL 104 03/25/2019 0901   CL 105 02/18/2012 1207   CO2 24 03/25/2019 0901   CO2 25 11/22/2013 1514   GLUCOSE 103 (H) 03/25/2019 0901   GLUCOSE 105 11/22/2013 1514   GLUCOSE 96 02/18/2012 1207   BUN 7 03/25/2019 0901   BUN 6.6 (L) 11/22/2013 1514   CREATININE 0.68 03/25/2019 0901   CREATININE 0.8 11/22/2013 1514   CALCIUM 9.2 03/25/2019 0901   CALCIUM 9.0 11/22/2013 1514   PROT 6.8 03/25/2019 0901   PROT 7.4 11/22/2013 1514   ALBUMIN 4.0 03/25/2019 0901   ALBUMIN 3.3 (L) 11/22/2013 1514   AST 21 03/25/2019 0901   AST 15 11/22/2013 1514   ALT 22 03/25/2019 0901   ALT 13 11/22/2013 1514   ALKPHOS 98 03/25/2019 0901   ALKPHOS 91 11/22/2013 1514   BILITOT 0.4 03/25/2019 0901   BILITOT 0.23 11/22/2013 1514   GFRNONAA 98 03/25/2019 0901   GFRAA 113 03/25/2019 0901   Lab Results  Component Value Date   CHOL 194 03/25/2019   HDL 43 03/25/2019   LDLCALC 133 (H) 03/25/2019   TRIG 99 03/25/2019   CHOLHDL 4.5 (H) 03/25/2019   No results found for: HGBA1C No results found for: VITAMINB12 Lab Results  Component Value Date   TSH 1.210 03/25/2019       ASSESSMENT AND PLAN 59 y.o. year old female  has a past medical history of Bronchitis, Cervical spondylarthritis, Headache(784.0), High cholesterol, Leukocytosis (2009), Multiple sclerosis ([place]) (2009), Rhinitis, and Sinusitis. here with     ICD-10-CM   1. Multiple sclerosis ([place])  G35 CBC with Differential/Platelets    CMP    Vitamin D, 25-hydroxy  2. High risk medication use  Z79.899 CBC with Differential/Platelets    CMP    Vitamin D,  25-hydroxy  3. Vitamin D deficiency  E55.9 Vitamin D, 25-hydroxy  4. Migraine without aura and without status migrainosus, not intractable  G43.009   5. Other fatigue  R53.83   6. Dysesthesia  R20.8   7. Insomnia, unspecified type  G47.00     Ahniyah is doing well. Symptoms are stable. No new weakness, numbness, vision or gait changes. No changes in bowel or bladder habits. We will continue Avonex injections as prescribed. I will update labs today. She will continue vitamin D supplement OTC. We have discussed sleep patters. Sleep hygiene encouraged. She will consider melatonin or valerian root as a sleep aid. Education provided in AVS. She will continue focusing on healthy lifestyle habits. She will follow up in 6 months, sooner if needed. She verbalizes understanding and agreement with this plan.    Orders Placed This Encounter  Procedures   CBC with Differential/Platelets   CMP   Vitamin D, 25-hydroxy     No orders of the defined  types were placed in this encounter.     I spent 30 minutes with the patient. 50% of this time was spent counseling and educating patient on plan of care and medications.    Debbora Presto, FNP-C 09/22/2019, 9:04 AM [facility] [address] [place], [place] [address] [phone]

## 2019-09-22 ENCOUNTER — Encounter: Payer: Self-pay | Admitting: Family Medicine

## 2019-09-22 ENCOUNTER — Ambulatory Visit: Payer: BC Managed Care – PPO | Admitting: Family Medicine

## 2019-09-22 ENCOUNTER — Encounter: Payer: Self-pay | Admitting: Genetic Counselor

## 2019-09-22 VITALS — BP 126/72 | HR 82 | Ht 64.0 in | Wt 218.0 lb

## 2019-09-22 DIAGNOSIS — R208 Other disturbances of skin sensation: Secondary | ICD-10-CM

## 2019-09-22 DIAGNOSIS — Z79899 Other long term (current) drug therapy: Secondary | ICD-10-CM | POA: Diagnosis not present

## 2019-09-22 DIAGNOSIS — G43009 Migraine without aura, not intractable, without status migrainosus: Secondary | ICD-10-CM

## 2019-09-22 DIAGNOSIS — E559 Vitamin D deficiency, unspecified: Secondary | ICD-10-CM | POA: Diagnosis not present

## 2019-09-22 DIAGNOSIS — R5383 Other fatigue: Secondary | ICD-10-CM

## 2019-09-22 DIAGNOSIS — G35 Multiple sclerosis: Secondary | ICD-10-CM

## 2019-09-22 DIAGNOSIS — G4700 Insomnia, unspecified: Secondary | ICD-10-CM

## 2019-09-22 NOTE — Progress Notes (Signed)
I have read the note, and I agree with the clinical assessment and plan.  Breann Losano A. Emmagrace Runkel, MD, PhD, FAAN Certified in Neurology, Clinical Neurophysiology, Sleep Medicine, Pain Medicine and Neuroimaging  [facility] [address], [address] [place], [place] [address] [phone]  

## 2019-09-23 ENCOUNTER — Telehealth: Payer: Self-pay

## 2019-09-23 LAB — COMPREHENSIVE METABOLIC PANEL
ALT: 20 IU/L (ref 0–32)
AST: 21 IU/L (ref 0–40)
Albumin/Globulin Ratio: 1.3 (ref 1.2–2.2)
Albumin: 4 g/dL (ref 3.8–4.9)
Alkaline Phosphatase: 106 IU/L (ref 48–121)
BUN/Creatinine Ratio: 10 (ref 9–23)
BUN: 7 mg/dL (ref 6–24)
Bilirubin Total: 0.2 mg/dL (ref 0.0–1.2)
CO2: 24 mmol/L (ref 20–29)
Calcium: 9.2 mg/dL (ref 8.7–10.2)
Chloride: 107 mmol/L — ABNORMAL HIGH (ref 96–106)
Creatinine, Ser: 0.69 mg/dL (ref 0.57–1.00)
GFR calc Af Amer: 113 mL/min/{1.73_m2} (ref >59)
GFR calc non Af Amer: 98 mL/min/{1.73_m2} (ref >59)
Globulin, Total: 3 g/dL (ref 1.5–4.5)
Glucose: 118 mg/dL — ABNORMAL HIGH (ref 65–99)
Potassium: 3.6 mmol/L (ref 3.5–5.2)
Sodium: 144 mmol/L (ref 134–144)
Total Protein: 7 g/dL (ref 6.0–8.5)

## 2019-09-23 LAB — CBC WITH DIFFERENTIAL/PLATELET
Basophils Absolute: 0.1 10*3/uL (ref 0.0–0.2)
Basos: 1 %
EOS (ABSOLUTE): 0.2 10*3/uL (ref 0.0–0.4)
Eos: 2 %
Hematocrit: 37.5 % (ref 34.0–46.6)
Hemoglobin: 12.4 g/dL (ref 11.1–15.9)
Immature Grans (Abs): 0 10*3/uL (ref 0.0–0.1)
Immature Granulocytes: 0 %
Lymphocytes Absolute: 3.8 10*3/uL — ABNORMAL HIGH (ref 0.7–3.1)
Lymphs: 36 %
MCH: 25.8 pg — ABNORMAL LOW (ref 26.6–33.0)
MCHC: 33.1 g/dL (ref 31.5–35.7)
MCV: 78 fL — ABNORMAL LOW (ref 79–97)
Monocytes Absolute: 0.6 10*3/uL (ref 0.1–0.9)
Monocytes: 6 %
Neutrophils Absolute: 5.8 10*3/uL (ref 1.4–7.0)
Neutrophils: 55 %
Platelets: 329 10*3/uL (ref 150–450)
RBC: 4.8 x10E6/uL (ref 3.77–5.28)
RDW: 16 % — ABNORMAL HIGH (ref 11.7–15.4)
WBC: 10.4 10*3/uL (ref 3.4–10.8)

## 2019-09-23 LAB — VITAMIN D 25 HYDROXY (VIT D DEFICIENCY, FRACTURES): Vit D, 25-Hydroxy: 28.2 ng/mL — ABNORMAL LOW (ref 30.0–100.0)

## 2019-09-23 NOTE — Telephone Encounter (Signed)
Attempted to call pt, LVM Asked pt to call back

## 2019-09-23 NOTE — Telephone Encounter (Signed)
-----   Message from Debbora Presto, NP sent at 09/23/2019  8:02 AM EDT ----- Let her know that her labs are stable. Glucose was a little elevated but could have been due to non fasting labs. Her vitamin D is a touch low. Please make sure she is taking vitamin D supplements every day. We will repeat labs with follow up and adjust dose if needed.

## 2019-09-27 ENCOUNTER — Telehealth: Payer: Self-pay | Admitting: *Deleted

## 2019-09-27 NOTE — Telephone Encounter (Signed)
-----   Message from Debbora Presto, NP sent at 09/23/2019  8:02 AM EDT ----- Let her know that her labs are stable. Glucose was a little elevated but could have been due to non fasting labs. Her vitamin D is a touch low. Please make sure she is taking vitamin D supplements every day. We will repeat labs with follow up and adjust dose if needed.

## 2019-12-20 ENCOUNTER — Other Ambulatory Visit: Payer: Self-pay | Admitting: *Deleted

## 2019-12-20 DIAGNOSIS — Z1501 Genetic susceptibility to malignant neoplasm of breast: Secondary | ICD-10-CM

## 2019-12-20 DIAGNOSIS — Z1509 Genetic susceptibility to other malignant neoplasm: Secondary | ICD-10-CM

## 2019-12-20 NOTE — Progress Notes (Signed)
Received call from pt with complaint of new right under arm nodule. Pt denies recent injury, trauma or covid vaccine.  Per MD pt needing bilateral diagnostic mammogram and right breast US with biopsy.  Orders placed, appointments scheduled and pt verbalized understanding of apt date and times.

## 2019-12-23 ENCOUNTER — Other Ambulatory Visit: Payer: Self-pay

## 2019-12-23 ENCOUNTER — Other Ambulatory Visit: Payer: Self-pay | Admitting: Hematology and Oncology

## 2019-12-23 ENCOUNTER — Ambulatory Visit
Admission: RE | Admit: 2019-12-23 | Discharge: 2019-12-23 | Disposition: A | Discharge status: Home / Self Care | Payer: BC Managed Care – PPO | Source: Ambulatory Visit | Attending: Hematology and Oncology | Admitting: Hematology and Oncology

## 2019-12-23 DIAGNOSIS — Z1509 Genetic susceptibility to other malignant neoplasm: Secondary | ICD-10-CM

## 2019-12-23 DIAGNOSIS — Z1501 Genetic susceptibility to malignant neoplasm of breast: Secondary | ICD-10-CM

## 2019-12-29 ENCOUNTER — Encounter: Payer: Self-pay | Admitting: *Deleted

## 2019-12-29 ENCOUNTER — Telehealth: Payer: Self-pay | Admitting: *Deleted

## 2019-12-29 NOTE — Telephone Encounter (Signed)
Submitted PA Avonex with letter for supporting documentation to [facility] at [phone]. Marked urgent. Received fax confirmation. Waiting on determination.

## 2019-12-30 NOTE — Telephone Encounter (Signed)
[facility] called insurance rejected Avonex due to needing a PA. I confirmed PA has been submitted PA for Avonex on 12/29/19

## 2019-12-31 ENCOUNTER — Other Ambulatory Visit: Payer: Self-pay

## 2019-12-31 ENCOUNTER — Other Ambulatory Visit: Payer: BC Managed Care – PPO

## 2019-12-31 ENCOUNTER — Ambulatory Visit
Admission: RE | Admit: 2019-12-31 | Discharge: 2019-12-31 | Disposition: A | Discharge status: Home / Self Care | Payer: BC Managed Care – PPO | Source: Ambulatory Visit | Attending: Hematology and Oncology | Admitting: Hematology and Oncology

## 2019-12-31 DIAGNOSIS — Z1509 Genetic susceptibility to other malignant neoplasm: Secondary | ICD-10-CM

## 2019-12-31 DIAGNOSIS — Z1501 Genetic susceptibility to malignant neoplasm of breast: Secondary | ICD-10-CM

## 2019-12-31 HISTORY — PX: BREAST BIOPSY: SHX20

## 2020-01-03 ENCOUNTER — Other Ambulatory Visit: Payer: Self-pay

## 2020-01-03 ENCOUNTER — Ambulatory Visit
Admission: RE | Admit: 2020-01-03 | Discharge: 2020-01-03 | Disposition: A | Discharge status: Home / Self Care | Payer: BC Managed Care – PPO | Source: Ambulatory Visit | Attending: Hematology and Oncology | Admitting: Hematology and Oncology

## 2020-01-03 ENCOUNTER — Other Ambulatory Visit: Payer: Self-pay | Admitting: Hematology and Oncology

## 2020-01-03 ENCOUNTER — Telehealth: Payer: Self-pay | Admitting: Hematology and Oncology

## 2020-01-03 DIAGNOSIS — C50011 Malignant neoplasm of nipple and areola, right female breast: Secondary | ICD-10-CM

## 2020-01-03 NOTE — Telephone Encounter (Signed)
Left message for patient to return call in reference to the [facility] on 12/1 with an arrival time of 815

## 2020-01-03 NOTE — Telephone Encounter (Signed)
PA denied. Faxed urgent appeal letter to [facility] Appeals department at [phone]. Received fax confirmation. Waiting on determination.

## 2020-01-05 NOTE — Telephone Encounter (Signed)
Called pt. Advised PA/appeal denied for Avonex. Dr. Felecia Shelling recommends switching therapy to Rebif once a week. Pt declined. She is going to reach out to [place] about getting screened for free drug for Avonex so she can stay on this therapy. Advised I will also e-mail Crystal P. (rep for [employer]) to let her know. I also faxed denial letters to [employer] at [phone]. Received fax confirmation.

## 2020-01-10 ENCOUNTER — Inpatient Hospital Stay: Payer: BC Managed Care – PPO | Admitting: Hematology and Oncology

## 2020-01-10 ENCOUNTER — Encounter: Payer: Self-pay | Admitting: *Deleted

## 2020-01-10 DIAGNOSIS — C50411 Malignant neoplasm of upper-outer quadrant of right female breast: Secondary | ICD-10-CM | POA: Insufficient documentation

## 2020-01-10 DIAGNOSIS — Z171 Estrogen receptor negative status [ER-]: Secondary | ICD-10-CM

## 2020-01-11 NOTE — Progress Notes (Signed)
Patient Care Team: Robyne Peers, MD as PCP - General (Family Medicine) Love, Alyson Locket, MD (Neurology) Jerrell Belfast, MD Mauro Kaufmann, RN as Oncology Nurse Navigator Rockwell Germany, RN as Oncology Nurse Navigator Donnie Mesa, MD as Consulting Physician (General Surgery) Nicholas Lose, MD as Consulting Physician (Hematology and Oncology) Kyung Rudd, MD as Consulting Physician (Radiation Oncology)  DIAGNOSIS:    ICD-10-CM   1. Malignant neoplasm of upper-outer quadrant of right breast in female, estrogen receptor negative ([place])  C50.411 MR BREAST BILATERAL W [place] CAD   Z17.1 ECHOCARDIOGRAM COMPLETE    SUMMARY OF ONCOLOGIC HISTORY: Oncology History  Malignant neoplasm of upper-outer quadrant of right breast in female, estrogen receptor negative ([place])  01/10/2020 Initial Diagnosis   Palpable right breast mass. Mammogram showed a 1.8cm mass at the 10:30 position 2cm from the nipple, a 1.5cm mass at the 10:30 position 7cm from the nipple, and indeterminate calcifications between the two masses in the right breast. 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    01/12/2020 Cancer Staging   Staging form: Breast, AJCC 8th Edition - Clinical stage from 01/12/2020: Stage IB (cT1c, cN0, cM0, G2, ER-, PR-, HER2-) - Signed by Nicholas Lose, MD on 01/12/2020     CHIEF COMPLIANT: Newly diagnosed breast cancer  INTERVAL HISTORY: Jacqueline Adams is a 59 y.o. with above-mentioned history of high risk for breast cancer and BRCA2 mutation. She palpated a right breast mass. Mammogram on 12/23/19 showed a 1.8cm mass at the 10:30 position 2cm from the nipple, a 1.5cm mass at the 10:30 position 7cm from the nipple, and indeterminate calcifications between the two masses in the right breast. Biopsy on 12/31/19 showed invasive ductal carcinoma with DCIS, grade 2, at both masses, HER-2 negative by FISH (ratio  1.77), ER/PR negative, Ki67 80%. She presents to the clinic today to discuss treatment options.   ALLERGIES:  is allergic to procaine and relafen [nabumetone].  MEDICATIONS:  Current Outpatient Medications  Medication Sig Dispense Refill  . ibuprofen (ADVIL) 400 MG tablet Take 1 tablet (400 mg total) by mouth every 8 (eight) hours as needed. 90 tablet 5  . interferon beta-1a (AVONEX PREFILLED) 30 MCG/0.5ML PSKT injection INJECT ONE SYRINGE INTRAMUSCULARLY ONCE WEEKLY. REFRIGERATE. PROTECT FROM LIGHT. ALLOW TO WARM TO ROOM TEMPERATURE PRIOR TO USE. 3 each 3  . Multiple Vitamins-Minerals (CENTRUM SILVER ADULT 50+ PO) Take by mouth.    . simvastatin (ZOCOR) 20 MG tablet Take 1 tablet by mouth daily.    [name] topiramate (TOPAMAX) 25 MG tablet Take 25 mg by mouth.    . fluticasone (FLONASE) 50 MCG/ACT nasal spray Place 2 sprays into both nostrils daily. 16 g 2  . VENTOLIN HFA 108 (90 BASE) MCG/ACT inhaler Inhale 2 puffs into the lungs every 6 (six) hours as needed.    [name] VITAMIN D PO Take 2 tablets by mouth daily. +calcium     No current facility-administered medications for this visit.    PHYSICAL EXAMINATION: ECOG PERFORMANCE STATUS: 1 - Symptomatic but completely ambulatory  Vitals:   01/12/20 0856  BP: 131/75  Pulse: 85  Resp: 18  Temp: 99 F (37.2 C)  SpO2: 98%   Filed Weights   01/12/20 0856  Weight: 217 lb 4.8 oz (98.6 kg)    BREAST: Palpable right breast mass. (exam performed in the presence of a chaperone)  LABORATORY DATA:  I have reviewed the data  as listed CMP Latest Ref Rng & Units 01/12/2020 09/22/2019 03/25/2019  Glucose 70 - 99 mg/dL 113(H) 118(H) 103(H)  BUN 6 - 20 mg/dL 8 7 7   Creatinine 0.44 - 1.00 mg/dL 0.73 0.69 0.68  Sodium 135 - 145 mmol/L 143 144 143  Potassium 3.5 - 5.1 mmol/L 3.5 3.6 3.5  Chloride 98 - 111 mmol/L 110 107(H) 104  CO2 22 - 32 mmol/L 27 24 24   Calcium 8.9 - 10.3 mg/dL 9.4 9.2 9.2  Total Protein 6.5 - 8.1 g/dL 7.7 7.0 6.8  Total Bilirubin  0.3 - 1.2 mg/dL 0.3 0.2 0.4  Alkaline Phos 38 - 126 U/L 96 106 98  AST 15 - 41 U/L 16 21 21   ALT 0 - 44 U/L 15 20 22     Lab Results  Component Value Date   WBC 13.1 (H) 01/12/2020   HGB 12.3 01/12/2020   HCT 38.3 01/12/2020   MCV 77.4 (L) 01/12/2020   PLT 330 01/12/2020   NEUTROABS 8.1 (H) 01/12/2020    ASSESSMENT & PLAN:  Malignant neoplasm of upper-outer quadrant of right breast in female, estrogen receptor negative ([place]) 01/10/2020:Palpable right breast mass. Mammogram showed a 1.8cm mass at the 10:30 position 2cm from the nipple, a 1.5cm mass at the 10:30 position 7cm from the nipple, and indeterminate calcifications between the two masses in the right breast. 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  BRCA2 Mutation  Pathology and radiology counseling: Discussed with the patient, the details of pathology including the type of breast cancer,the clinical staging, the significance of ER, PR and HER-2/neu receptors and the implications for treatment. After reviewing the pathology in detail, we proceeded to discuss the different treatment options between surgery, radiation, chemotherapy, antiestrogen therapies.  Recommendation based on multidisciplinary tumor board: 1. Neoadjuvant chemotherapy with Adriamycin and Cytoxan with pembrolizumab every 3 weeks 4 followed by Taxol weekly 12 with carboplatin and pembrolizumab every 3 weeks x4  2. Followed by breast conserving surgery with sentinel lymph node study vs bilateral mastectomies 3. Followed by adjuvant radiation therapy if she undergoes breast conserving surgery 4.  Adjuvant pembrolizumab 5.  Adjuvant olaparib  Chemotherapy Counseling: I discussed the risks and benefits of chemotherapy including the risks of nausea/ vomiting, risk of infection from low WBC count, fatigue due to chemo or anemia, bruising or bleeding due to low platelets, mouth sores, loss/  change in taste and decreased appetite. Liver and kidney function will be monitored through out chemotherapy as abnormalities in liver and kidney function may be a side effect of treatment.  Peripheral neuropathy due to Taxol and cardiac dysfunction due to Adriamycin was discussed in detail. Risk of permanent bone marrow dysfunction due to chemo were also discussed.  Plan: 1. Port placement to be done next Monday 2. Echocardiogram 3. Chemotherapy class 4. Breast MRI 5. URCC nausea study  URCC 16070: Treatment of refractory nausea.  After first cycle of chemo if patient experience chemo induced nausea and vomiting the randomized from cycle 2 to Aloxi plus Dex plus olanzapine or placebo plus Compazine or placebo plus placebo prior to chemo and take home medications for day 2 today for of Dex plus olanzapine or placebo and Compazine or placebo every 8 hours.  If patient does not have nausea after cycle 1, then the trial is complete.   Return to clinic in 2 weeks to start chemotherapy.    Orders Placed This Encounter  Procedures  .  MR BREAST BILATERAL W WO CONTRAST INC CAD    [employer] Epic order PF @ [facility] 12/23/2019 Dx:Malignant Neoplasm/New Breast No cycle  Wt:217lbs Ht: 5'4/No needs/No Claus/No to all Covid/NKDA to Contrast dye/No metal in eyes or removed by a physician//No gluc mon/no stents/no pacemaker/No spinal stimu/no injectors/No defibulator/No Port/no bullets or bbs/No Implants/No hardware/No hx of breast sx/No hx of brain, heart, eyes, or ear sx/BCR with [place]   Patient aware to come alone & aware he/she will be asked to change into surgical mask w/o metal for MRI safety purposes    Standing Status:   Future    Standing Expiration Date:   01/11/2021    Order Specific Question:   If indicated for the ordered procedure, I authorize the administration of contrast media per Radiology protocol    Answer:   Yes    Order Specific Question:   What is the patient's sedation  requirement?    Answer:   No Sedation    Order Specific Question:   Does the patient have a pacemaker or implanted devices?    Answer:   No    Order Specific Question:   Radiology Contrast Protocol - do NOT remove file path    Answer:   [email or web address]    Order Specific Question:   Preferred imaging location?    Answer:   [facility] (table limit-550lbs)  . ECHOCARDIOGRAM COMPLETE    Standing Status:   Future    Standing Expiration Date:   01/11/2021    Order Specific Question:   Where should this test be performed    Answer:   [facility]    Order Specific Question:   Perflutren DEFINITY (image enhancing agent) should be administered unless hypersensitivity or allergy exist    Answer:   Administer Perflutren    Order Specific Question:   Reason for exam-Echo    Answer:   Chemotherapy evaluation  v87.41 / v58.11   The patient has a good understanding of the overall plan. she agrees with it. she will call with any problems that may develop before the next visit here.  Total time spent: 60 mins including face to face time and time spent for planning, charting and coordination of care  Nicholas Lose, MD 01/12/2020  I, Cloyde Reams Dorshimer, am acting as scribe for Dr. Nicholas Lose.  I have reviewed the above documentation for accuracy and completeness, and I agree with the above.

## 2020-01-12 ENCOUNTER — Encounter: Payer: Self-pay | Admitting: Licensed Clinical Social Worker

## 2020-01-12 ENCOUNTER — Ambulatory Visit: Payer: BC Managed Care – PPO | Attending: Surgery | Admitting: Physical Therapy

## 2020-01-12 ENCOUNTER — Encounter: Payer: Self-pay | Admitting: Hematology and Oncology

## 2020-01-12 ENCOUNTER — Ambulatory Visit: Payer: Self-pay | Admitting: Surgery

## 2020-01-12 ENCOUNTER — Ambulatory Visit
Admission: RE | Admit: 2020-01-12 | Discharge: 2020-01-12 | Disposition: A | Discharge status: Home / Self Care | Payer: BC Managed Care – PPO | Source: Ambulatory Visit | Attending: Radiation Oncology | Admitting: Radiation Oncology

## 2020-01-12 ENCOUNTER — Inpatient Hospital Stay: Payer: BC Managed Care – PPO | Attending: Hematology and Oncology | Admitting: Hematology and Oncology

## 2020-01-12 ENCOUNTER — Inpatient Hospital Stay: Payer: BC Managed Care – PPO

## 2020-01-12 ENCOUNTER — Other Ambulatory Visit: Payer: Self-pay | Admitting: *Deleted

## 2020-01-12 ENCOUNTER — Encounter: Payer: Self-pay | Admitting: *Deleted

## 2020-01-12 ENCOUNTER — Encounter: Payer: Self-pay | Admitting: Physical Therapy

## 2020-01-12 ENCOUNTER — Other Ambulatory Visit: Payer: Self-pay

## 2020-01-12 VITALS — BP 131/75 | HR 85 | Temp 99.0°F | Resp 18 | Ht 64.0 in | Wt 217.3 lb

## 2020-01-12 DIAGNOSIS — C50411 Malignant neoplasm of upper-outer quadrant of right female breast: Secondary | ICD-10-CM | POA: Insufficient documentation

## 2020-01-12 DIAGNOSIS — Z171 Estrogen receptor negative status [ER-]: Secondary | ICD-10-CM

## 2020-01-12 DIAGNOSIS — C50919 Malignant neoplasm of unspecified site of unspecified female breast: Secondary | ICD-10-CM

## 2020-01-12 DIAGNOSIS — R293 Abnormal posture: Secondary | ICD-10-CM | POA: Insufficient documentation

## 2020-01-12 HISTORY — DX: Malignant neoplasm of unspecified site of unspecified female breast: C50.919

## 2020-01-12 LAB — CBC WITH DIFFERENTIAL (CANCER CENTER ONLY)
Abs Immature Granulocytes: 0.04 10*3/uL (ref 0.00–0.07)
Basophils Absolute: 0.1 10*3/uL (ref 0.0–0.1)
Basophils Relative: 1 %
Eosinophils Absolute: 0.2 10*3/uL (ref 0.0–0.5)
Eosinophils Relative: 1 %
HCT: 38.3 % (ref 36.0–46.0)
Hemoglobin: 12.3 g/dL (ref 12.0–15.0)
Immature Granulocytes: 0 %
Lymphocytes Relative: 31 %
Lymphs Abs: 4 10*3/uL (ref 0.7–4.0)
MCH: 24.8 pg — ABNORMAL LOW (ref 26.0–34.0)
MCHC: 32.1 g/dL (ref 30.0–36.0)
MCV: 77.4 fL — ABNORMAL LOW (ref 80.0–100.0)
Monocytes Absolute: 0.7 10*3/uL (ref 0.1–1.0)
Monocytes Relative: 5 %
Neutro Abs: 8.1 10*3/uL — ABNORMAL HIGH (ref 1.7–7.7)
Neutrophils Relative %: 62 %
Platelet Count: 330 10*3/uL (ref 150–400)
RBC: 4.95 MIL/uL (ref 3.87–5.11)
RDW: 16.3 % — ABNORMAL HIGH (ref 11.5–15.5)
WBC Count: 13.1 10*3/uL — ABNORMAL HIGH (ref 4.0–10.5)
nRBC: 0 % (ref 0.0–0.2)

## 2020-01-12 LAB — CMP (CANCER CENTER ONLY)
ALT: 15 U/L (ref 0–44)
AST: 16 U/L (ref 15–41)
Albumin: 3.6 g/dL (ref 3.5–5.0)
Alkaline Phosphatase: 96 U/L (ref 38–126)
Anion gap: 6 (ref 5–15)
BUN: 8 mg/dL (ref 6–20)
CO2: 27 mmol/L (ref 22–32)
Calcium: 9.4 mg/dL (ref 8.9–10.3)
Chloride: 110 mmol/L (ref 98–111)
Creatinine: 0.73 mg/dL (ref 0.44–1.00)
GFR, Estimated: >60 mL/min (ref >60)
Glucose, Bld: 113 mg/dL — ABNORMAL HIGH (ref 70–99)
Potassium: 3.5 mmol/L (ref 3.5–5.1)
Sodium: 143 mmol/L (ref 135–145)
Total Bilirubin: 0.3 mg/dL (ref 0.3–1.2)
Total Protein: 7.7 g/dL (ref 6.5–8.1)

## 2020-01-12 LAB — GENETIC SCREENING ORDER

## 2020-01-12 MED ORDER — LIDOCAINE-PRILOCAINE 2.5-2.5 % EX CREA: TOPICAL_CREAM | CUTANEOUS | 3 refills | Status: DC

## 2020-01-12 MED ORDER — PROCHLORPERAZINE MALEATE 10 MG PO TABS: 10.0000 mg | ORAL_TABLET | Freq: Four times a day (QID) | ORAL | 1 refills | Status: DC | PRN

## 2020-01-12 MED ORDER — DEXAMETHASONE 4 MG PO TABS: ORAL_TABLET | ORAL | 0 refills | Status: DC

## 2020-01-12 MED ORDER — ONDANSETRON HCL 8 MG PO TABS: 8.0000 mg | ORAL_TABLET | Freq: Two times a day (BID) | ORAL | 1 refills | Status: DC | PRN

## 2020-01-12 NOTE — Patient Instructions (Signed)

## 2020-01-12 NOTE — Therapy (Signed)
[place], [place], [address] Phone: [phone]   Fax:  [phone]  Physical Therapy Evaluation  Patient Details  Name: Jacqueline Adams MRN: [id number] Date of Birth: 05/07/1960 Referring Provider (PT): Dr. Donnie Mesa   Encounter Date: 01/12/2020   PT End of Session - 01/12/20 1108    Visit Number 1    Number of Visits 2    Date for PT Re-Evaluation 07/12/20    PT Start Time 0946    PT Stop Time 0954   Also saw pt from 1017-1040 for a total of 31 minutes   PT Time Calculation (min) 8 min    Activity Tolerance Patient tolerated treatment well    Behavior During Therapy [facility] for tasks assessed/performed           Past Medical History:  Diagnosis Date  . Bronchitis   . Cervical spondylarthritis   . Headache(784.0)   . High cholesterol   . Leukocytosis 2009  . Multiple sclerosis ([place]) 2009   followed by Dr. Erling Cruz  . Rhinitis   . Sinusitis     Past Surgical History:  Procedure Laterality Date  . CESAREAN SECTION     x 3  . PARTIAL HYSTERECTOMY  2006   for uterine fibroid.     There were no vitals filed for this visit.    Subjective Assessment - 01/12/20 1100    Subjective Patient reports he is here today to be seen by her medical team for her newly diagnosed right breast cancer.    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 There are 2 masses in the upper outer quadrant measuring 1.6 cm and 1.8 cm. She has relapsing remitting multiple sclerosis since 2009.    Patient Stated Goals Reduce lymphedema risk and learn post op shoulder ROM HEP    Currently in Pain? No/denies              [facility] PT Assessment - 01/12/20 0001      Assessment   Medical Diagnosis Right breast cancer    Referring Provider (PT) Dr. Donnie Mesa    Onset  Date/Surgical Date 12/23/19    Hand Dominance Right    Prior Therapy none      Precautions   Precautions Other (comment)    Precaution Comments Active cancer; MS      Restrictions   Weight Bearing Restrictions No      Balance Screen   Has the patient fallen in the past 6 months No    Has the patient had a decrease in activity level because of a fear of falling?  No    Is the patient reluctant to leave their home because of a fear of falling?  No      Home Environment   Living Environment Private residence    Living Arrangements Spouse/significant other;Children   Husband and 42 y.o. son; dauhgter is away at college   Available Help at Discharge Family      Prior Function   Level of Independence Independent    Vocation Full time employment    Education officer, community at [employer] She walks around her house but does not exercise      Cognition   Overall Cognitive Status Within Functional Limits for tasks assessed      Posture/Postural Control   Posture/Postural Control  Postural limitations    Postural Limitations Rounded Shoulders;Forward head      ROM / Strength   AROM / PROM / Strength AROM;Strength      AROM   Overall AROM Comments Cervical bilateral sidebending limited 25%; others WNL    AROM Assessment Site Shoulder    Right/Left Shoulder Right;Left    Right Shoulder Extension 46 Degrees    Right Shoulder Flexion 151 Degrees    Right Shoulder ABduction 144 Degrees    Right Shoulder Internal Rotation 40 Degrees    Right Shoulder External Rotation 72 Degrees    Left Shoulder Extension 45 Degrees    Left Shoulder Flexion 146 Degrees    Left Shoulder ABduction 139 Degrees    Left Shoulder Internal Rotation 54 Degrees    Left Shoulder External Rotation 77 Degrees      Strength   Overall Strength Within functional limits for tasks performed             LYMPHEDEMA/ONCOLOGY QUESTIONNAIRE - 01/12/20 0001      Type   Cancer Type Right breast cancer       Lymphedema Assessments   Lymphedema Assessments Upper extremities      Right Upper Extremity Lymphedema   10 cm Proximal to Olecranon Process 34.4 cm    Olecranon Process 28.5 cm    10 cm Proximal to Ulnar Styloid Process 24.9 cm    Just Proximal to Ulnar Styloid Process 17.1 cm    Across Hand at [employer] 19.8 cm    At [place] of 2nd Digit 6.6 cm      Left Upper Extremity Lymphedema   10 cm Proximal to Olecranon Process 35 cm    Olecranon Process 29.1 cm    10 cm Proximal to Ulnar Styloid Process 24.3 cm    Just Proximal to Ulnar Styloid Process 17.8 cm    Across Hand at [employer] 20.8 cm    At [place] of 2nd Digit 6.6 cm           L-DEX FLOWSHEETS - 01/12/20 1100      L-DEX LYMPHEDEMA SCREENING   Measurement Type Unilateral    L-DEX MEASUREMENT EXTREMITY Upper Extremity    POSITION  Standing    DOMINANT SIDE Right    At Risk Side Right    BASELINE SCORE (UNILATERAL) -0.3          The patient was assessed using the L-Dex machine today to produce a lymphedema index baseline score. The patient will be reassessed on a regular basis (typically every 3 months) to obtain new L-Dex scores. If the score is > 6.5 points away from his/her baseline score indicating onset of subclinical lymphedema, it will be recommended to wear a compression garment for 4 weeks, 12 hours per day and then be reassessed. If the score continues to be > 6.5 points from baseline at reassessment, we will initiate lymphedema treatment. Assessing in this manner has a 95% rate of preventing clinically significant lymphedema.       Katina Dung - 01/12/20 0001    Open a tight or new jar Mild difficulty    Do heavy household chores (wash walls, wash floors) No difficulty    Carry a shopping bag or briefcase No difficulty    Wash your back No difficulty    Use a knife to cut food No difficulty    Recreational activities in which you take some force or impact through your arm, shoulder, or hand  (golf,  hammering, tennis) No difficulty    During the past week, to what extent has your arm, shoulder or hand problem interfered with your normal social activities with family, friends, neighbors, or groups? Not at all    During the past week, to what extent has your arm, shoulder or hand problem limited your work or other regular daily activities Not at all    Arm, shoulder, or hand pain. None    Tingling (pins and needles) in your arm, shoulder, or hand None    Difficulty Sleeping No difficulty    DASH Score 2.27 %            Objective measurements completed on examination: See above findings.        Patient was instructed today in a home exercise program today for post op shoulder range of motion. These included active assist shoulder flexion in sitting, scapular retraction, wall walking with shoulder abduction, and hands behind head external rotation.  She was encouraged to do these twice a day, holding 3 seconds and repeating 5 times when permitted by her physician.           PT Education - 01/12/20 1107    Education Details Lymphedema risk reduction and post op shoulder ROM HEP    Person(s) Educated Patient;Other (comment)   sister   Methods Explanation;Demonstration;Handout    Comprehension Returned demonstration;Verbalized understanding               PT Long Term Goals - 01/12/20 1114      PT LONG TERM GOAL #1   Title Patient will demonstrate she has regained full shoulder ROM and function post operatively compared to baselines.    Time 6    Period Months    Status New    Target Date 07/12/20           Breast Clinic Goals - 01/12/20 1114      Patient will be able to verbalize understanding of pertinent lymphedema risk reduction practices relevant to her diagnosis specifically related to skin care.   Time 1    Period Days    Status Achieved      Patient will be able to return demonstrate and/or verbalize understanding of the post-op home exercise  program related to regaining shoulder range of motion.   Time 1    Period Days    Status Achieved      Patient will be able to verbalize understanding of the importance of attending the postoperative After Breast Cancer Class for further lymphedema risk reduction education and therapeutic exercise.   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 There are 2 masses in the upper outer quadrant measuring 1.6 cm and 1.8 cm. She has relapsing remitting multiple sclerosis since 2009. Her multidiscipinary medical team met prior to her assessments to determine a recommended treatment. She is planning to have neoadjuvant chemotherapy followed by right breast surgery (lumpectomy likely) with sentinel node biopsy, followed by radiation. She will benefit from a post op PT reassessment to determine needs and from L-Dex screens every 3 months for 2 years to detect subclinical lymphedema.    Stability/Clinical Decision Making Stable/Uncomplicated    Clinical Decision Making Low    Rehab Potential Excellent  PT Frequency --   Eval and 1 f/u visit   PT Treatment/Interventions ADLs/Self Care Home Management;Therapeutic exercise;Patient/family education    PT Next Visit Plan Will reassess 3-4 weeks post op    PT Home Exercise Plan Post op shoulder ROM HEP    Consulted and Agree with Plan of Care Patient;Family member/caregiver    Family Member Consulted sister           Patient will benefit from skilled therapeutic intervention in order to improve the following deficits and impairments:  Postural dysfunction, Decreased knowledge of precautions, Impaired UE functional use, Pain, Decreased range of motion  Visit Diagnosis: Malignant neoplasm of upper-outer quadrant of right breast in female, estrogen receptor negative  ([place]) - Plan: PT plan of care cert/re-cert  Abnormal posture - Plan: PT plan of care cert/re-cert   Patient will follow up at outpatient cancer rehab 3-4 weeks following surgery.  If the patient requires physical therapy at that time, a specific plan will be dictated and sent to the referring physician for approval. The patient was educated today on appropriate basic range of motion exercises to begin post operatively and the importance of attending the After Breast Cancer class following surgery.  Patient was educated today on lymphedema risk reduction practices as it pertains to recommendations that will benefit the patient immediately following surgery.  She verbalized good understanding.      Problem List Patient Active Problem List   Diagnosis Date Noted  . Malignant neoplasm of upper-outer quadrant of right breast in female, estrogen receptor negative ([place]) 01/10/2020  . High risk medication use 03/25/2019  . Microcytic anemia 12/31/2017  . 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 UACS 12/16/2017  . History of pneumonia 11/12/2017  . Morbid obesity ([place]) 08/19/2016  . Numbness 05/09/2015  . Other fatigue 05/09/2015  . Arthralgia of hip 11/16/2014  . Low back pain with sciatica 11/16/2014  . Urinary frequency 11/16/2014  . Encounter for general adult medical examination without abnormal findings 06/07/2014  . DOE (dyspnea on exertion) 04/27/2014  . Atypical chest pain 04/27/2014  . Dyslipidemia 04/27/2014  . HLD (hyperlipidemia) 02/14/2014  . Cervical spondylosis without myelopathy 07/28/2013  . Cervical pain 07/28/2013  . CFIDS (chronic fatigue and immune dysfunction syndrome) ([place]) 07/28/2013  . Chronic infection of sinus 07/28/2013  . Headache, migraine 07/28/2013  . 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 Spasms of the hands or feet 07/28/2013  . DDD (degenerative disc disease), cervical 07/28/2013  . Menopausal symptom 07/28/2013  . Taking drug for chronic disease 07/28/2013  .  Vitamin D deficiency 07/28/2013  . Degeneration of intervertebral disc of cervical region 07/28/2013  . Cough 04/06/2013  . BRCA gene mutation positive in female 06/12/2011  . Genetic susceptibility to malignant neoplasm of breast 06/12/2011  . Multiple sclerosis ([place])   . Leukocytosis    Annia Friendly, [place] 01/12/20 11:16 AM  [place], [place], [address] Phone: [phone]   Fax:  [phone]  Name: GURTHA PICKER MRN: [id number] Date of Birth: Jul 12, 1960

## 2020-01-12 NOTE — Progress Notes (Signed)
[place] Work  Initial Assessment   Jacqueline Adams is a 59 y.o. year old female accompanied by sister, 20. Clinical Social Work was referred by [facility] for assessment of psychosocial needs.   SDOH (Social Determinants of Health) assessments performed: Yes SDOH Interventions     Most Recent Value  SDOH Interventions  Food Insecurity Interventions Intervention Not Indicated  Financial Strain Interventions Intervention Not Indicated  Housing Interventions Intervention Not Indicated  Social Connections Interventions Intervention Not Indicated  Transportation Interventions Intervention Not Indicated      Distress Screen completed: Yes ONCBCN DISTRESS SCREENING 01/12/2020  Screening Type Initial Screening  Distress experienced in past week (1-10) 0  Information Concerns Type Lack of info about diagnosis  Physical Problem type Tingling hands/feet      Family/Social Information:  . Housing Arrangement: patient lives with husband, Jacqueline Adams and 67yo son Jacqueline Adams. 16yo daughter in college . Family members/support persons in your life? Family (husband, kids, siblings), Friends/Colleagues and faith community . Transportation concerns: no  . 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 Income source: Employment. Working remotely since March 2020 . Financial concerns: No o Type of concern: None . Food access concerns: no . Religious or spiritual practice: yes, have faith/communion nightly . Services Currently in place:  Medical team for MS  Coping/ Adjustment to diagnosis: . Patient understands treatment plan and what happens next? yes, feels confident with plan presented today . Concerns about diagnosis and/or treatment: I'm not especially worried about anything because of faith . Patient reported stressors: No significant stressors . Hopes and priorities: priorities are faith and family . Patient enjoys time with family/ friends and faith and prayer times . Current  coping skills/ strengths: Average or above average intelligence, Capable of independent living, Financial means, Motivation for treatment/growth, Religious Affiliation and Supportive family/friends    SUMMARY: Current SDOH Barriers:  . No significant SDOH barriers noted today  Clinical Social Work Clinical Goal(s):  [name] Patient will continue to attend appointments as recommended  Interventions: . Discussed common feeling and emotions when being diagnosed with cancer, and the importance of support during treatment . Informed patient of the support team roles and support services at [facility] . Provided CSW contact information and encouraged patient to call with any questions or concerns   Follow Up Plan: Patient will contact CSW with any support or resource needs Patient verbalizes understanding of plan: Yes    Christeen Douglas , LCSW

## 2020-01-12 NOTE — H&P (Signed)
History of Present Illness Jacqueline Adams. Jacqueline Adams Jacqueline Adams; 01/12/2020 12:00 PM) The patient is a 59 year old female who presents with breast cancer. Breast MDC 01/12/20 [place]ud[place]/ Jacqueline Adams  This is a 59 year old female who is BRCA 2 positive after testing when her mother had breast and ovarian cancer. She presents after a normal mammogram in 2/21 with a palpable mass in her right breast. She underwent further work-up which revealed two tumors in the RUOQ at 10:30. The mass 2 cmfn measured 1.3 x 1.8 x 1.1 cm. The mass 7 cmfn measured 1.6 x 1.5 x 1.5 cm. 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 She has seen Dr. Lindi Adams in the past and has had the discussion regarding mastectomies and oophorectomies but has not had anything done yet. Her sister also is BRCA 2 positive.    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 Cesarean Section - Multiple Colon Polyp Removal - Colonoscopy Foot Surgery Bilateral. Hysterectomy (not due to cancer) - Partial Oral Surgery  Diagnostic Studies History Jacqueline Slipper, RN; 01/12/2020 7:54 AM) Colonoscopy 5-10 years ago Mammogram within last year Pap Smear 1-5 years ago  Allergies Jacqueline Adams, Jacqueline Adams; 01/12/2020 12:00 PM) Procaine HCl *LOCAL ANESTHETICS-Parenteral* Swelling. Relafen *ANALGESICS - ANTI-INFLAMMATORY* Swelling.  Medication History Jacqueline Adams. Jacqueline Adams, Jacqueline Adams; 01/12/2020 12:00 PM) Medications Reconciled Flonase (50MCG/DOSE Inhaler, Nasal) Active. Avonex Pen (30MCG/0.5ML Auto-inj Kit, Intramuscular) Active. Multiple Vitamin (1 (one) Oral) Active. Zocor (20MG Tablet, Oral) Active. Topamax (25MG Tablet, Oral) Active. Ventolin HFA (108 (90 Base)MCG/ACT Aerosol Soln, Inhalation) Active. Vitamin D (1000UNIT Tablet, Oral)  Active.  Social History Jacqueline Slipper, RN; 01/12/2020 7:54 AM) Caffeine use Carbonated beverages, Tea. No alcohol use No drug use Tobacco use Never smoker.  Family History Jacqueline Slipper, RN; 01/12/2020 7:54 AM) Arthritis Mother. Breast Cancer Mother. Diabetes Mellitus Mother. Hypertension Mother. Migraine Headache Sister. Ovarian Cancer Mother, Sister. Rectal Cancer Sister. Respiratory Condition Sister. Thyroid problems Sister.  Pregnancy / Birth History Jacqueline Slipper, RN; 01/12/2020 7:54 AM) Age at menarche 45 years. Age of menopause 48-55 Gravida 3 Irregular periods Length (months) of breastfeeding 7-12 Maternal age 6-25 Para 2  Other Problems Jacqueline Adams. Jacqueline Adams, Jacqueline Adams; 01/12/2020 12:00 PM) Hypercholesterolemia Lump In Breast Migraine Headache     Review of Systems Jacqueline Slipper RN; 01/12/2020 7:54 AM) General Not Present- Appetite Loss, Chills, Fatigue, Fever, Night Sweats, Weight Gain and Weight Loss. Skin Not Present- Change in Wart/Mole, Dryness, Hives, Jaundice, New Lesions, Non-Healing Wounds, Rash and Ulcer. HEENT Present- Seasonal Allergies and Wears glasses/contact lenses. Not Present- Earache, Hearing Loss, Hoarseness, Nose Bleed, Oral Ulcers, Ringing in the Ears, Sinus Pain, Sore Throat, Visual Disturbances and Yellow Eyes. Respiratory Not Present- Bloody sputum, Chronic Cough, Difficulty Breathing, Snoring and Wheezing. Breast Present- Breast Mass. Not Present- Breast Pain, Nipple Discharge and Skin Changes. Cardiovascular Not Present- Chest Pain, Difficulty Breathing Lying Down, Leg Cramps, Palpitations, Rapid Heart Rate, Shortness of Breath and Swelling of Extremities. Gastrointestinal Not Present- Abdominal Pain, Bloating, Bloody Stool, Change in Bowel Habits, Chronic diarrhea, Constipation, Difficulty Swallowing, Excessive gas, Gets full quickly at meals, Hemorrhoids, Indigestion, Nausea, Rectal Pain and Vomiting. Female Genitourinary Not  Present- Frequency, Nocturia, Painful Urination, Pelvic Pain and Urgency. Musculoskeletal Present- Muscle Pain. Not Present- Back Pain, Joint Pain, Joint Stiffness, Muscle Weakness and Swelling of Extremities. Neurological Present- Numbness and Tingling. Not  Present- Decreased Memory, Fainting, Headaches, Seizures, Tremor, Trouble walking and Weakness. Psychiatric Not Present- Anxiety, Bipolar, Change in Sleep Pattern, Depression, Fearful and Frequent crying. Endocrine Present- Hot flashes. Not Present- Cold Intolerance, Excessive Hunger, Hair Changes, Heat Intolerance and New Diabetes. Hematology Not Present- Blood Thinners, Easy Bruising, Excessive bleeding, Gland problems, HIV and Persistent Infections.   Physical Exam Jacqueline Adams Jacqueline Adams; 01/12/2020 12:02 PM)  The physical exam findings are as follows: Note:Constitutional: WDWN in NAD, conversant, no obvious deformities; resting comfortably Eyes: Pupils equal, round; sclera anicteric; moist conjunctiva; no lid lag HENT: Oral mucosa moist; good dentition Neck: No masses palpated, trachea midline; no thyromegaly Lungs: CTA bilaterally; normal respiratory effort CV: Regular rate and rhythm; no murmurs; extremities well-perfused with no edema Breasts: symmetric; no nipple changes; palpable mass in RUOQ; no axillary lymphadenopathy; no left masses or LAD Abd: +bowel sounds, soft, non-tender, no palpable organomegaly; no palpable hernias Musc: Normal gait; no apparent clubbing or cyanosis in extremities Lymphatic: No palpable cervical or axillary lymphadenopathy Skin: Warm, dry; no sign of jaundice Psychiatric - alert and oriented x 4; calm mood and affect    Assessment & Plan Jacqueline Adams Jacqueline Adams; 01/12/2020 11:57 AM)  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 The surgical procedure has been discussed with the patient. Potential risks, benefits, alternative treatments, and expected outcomes have been explained. All of the patient's questions at this time have been answered. The likelihood of reaching the patient's treatment goal is good. The patient understand the proposed surgical procedure and wishes to proceed. Note:I spent a considerable amount of time with the patient and her sister, who is also BRCA 2 positive. We discussed breast conserving surgery x radiation after neoadjuvant chemotherapy, if the the tumors respond. We also discussed the recommendation for mastectomy, possibly bilateral mastectomies for BRCA2, along with oophorectomy by GYN. This was all discussed with her in the past, but she seems to not remember any discussion. We discussed the possibility of breast reconstruction by Plastic Surgery. If she wishes to learn more about the possibility of reconstruction, she can see Plastic Surgery while she is receiving neoadjuvant chemotherapy so we can schedule her surgery in timely fashion after chemotherapy.  Current plan:   MRI Biopsy of microcalcifications between the two cancers Port placement Neoadjuvant chemotherapy Follow-up MRI Surgery - mastectomy +/- reconstruction vs. breast conserving therapy with XRT  Jacqueline Adams. Georgette Dover, Jacqueline Adams, [facility] Surgery  General/ Trauma Surgery   01/12/2020 12:02 PM

## 2020-01-12 NOTE — Assessment & Plan Note (Signed)
01/10/2020:Palpable right breast mass. Mammogram showed a 1.8cm mass at the 10:30 position 2cm from the nipple, a 1.5cm mass at the 10:30 position 7cm from the nipple, and indeterminate calcifications between the two masses in the right breast. 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  BRCA Mutation  Pathology and radiology counseling: Discussed with the patient, the details of pathology including the type of breast cancer,the clinical staging, the significance of ER, PR and HER-2/neu receptors and the implications for treatment. After reviewing the pathology in detail, we proceeded to discuss the different treatment options between surgery, radiation, chemotherapy, antiestrogen therapies.  Recommendation based on multidisciplinary tumor board: 1. Neoadjuvant chemotherapy with Adriamycin and Cytoxan with pembrolizumab every 3 weeks 4 followed by Taxol weekly 12 with carboplatin and pembrolizumab every 3 weeks x4  2. Followed by breast conserving surgery with sentinel lymph node study vs bilateral mastectomies 3. Followed by adjuvant radiation therapy if she undergoes breast conserving surgery 4.  Adjuvant pembrolizumab 5.  Adjuvant olaparib  Chemotherapy Counseling: I discussed the risks and benefits of chemotherapy including the risks of nausea/ vomiting, risk of infection from low WBC count, fatigue due to chemo or anemia, bruising or bleeding due to low platelets, mouth sores, loss/ change in taste and decreased appetite. Liver and kidney function will be monitored through out chemotherapy as abnormalities in liver and kidney function may be a side effect of treatment.  Peripheral neuropathy due to Taxol and cardiac dysfunction due to Adriamycin was discussed in detail. Risk of permanent bone marrow dysfunction due to chemo were also discussed.  Plan: 1. Port placement to be done next Monday 2. Echocardiogram 3.  Chemotherapy class 4. Breast MRI 5. URCC nausea study  URCC 16070: Treatment of refractory nausea.  After first cycle of chemo if patient experience chemo induced nausea and vomiting the randomized from cycle 2 to Aloxi plus Dex plus olanzapine or placebo plus Compazine or placebo plus placebo prior to chemo and take home medications for day 2 today for of Dex plus olanzapine or placebo and Compazine or placebo every 8 hours.  If patient does not have nausea after cycle 1, then the trial is complete.   Return to clinic in 2 weeks to start chemotherapy.

## 2020-01-12 NOTE — Progress Notes (Signed)
Radiation Oncology         [phone] ________________________________  Name: Jacqueline Adams        MRN: [id number]  Date of Service: 01/12/2020 DOB: 1961/01/04  QQ:[id number], Wynonia Musty, MD  Donnie Mesa, MD     REFERRING PHYSICIAN: Donnie Mesa, MD   DIAGNOSIS: The encounter diagnosis was Malignant neoplasm of upper-outer quadrant of right breast in female, estrogen receptor negative ([place]).   HISTORY OF PRESENT ILLNESS: Jacqueline Adams is a 59 y.o. female seen in the multidisciplinary breast clinic for a new diagnosis of right breast cancer, patient had a palpable mass in the right breast, and has a strong family history of breast and ovarian cancer in herself is BRCA2 positive.  By diagnostic imaging, a mass was seen that she was able to palpate in the 1030 o'clock position of the upper outer quadrant measuring 1.6 cm, a second mass in the upper outer quadrant also at 1030 o'clock was noted and measured 1.8 cm.  Her axilla was negative for adenopathy, and clip films measured the 2 masses 7 cm apart.  There were also indeterminate calcifications between the 2 masses.  These have not been sampled.  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  She is seen today to discuss treatment recommendations for her cancer.    PREVIOUS RADIATION THERAPY: No   PAST MEDICAL HISTORY:  Past Medical History:  Diagnosis Date   Bronchitis    Cervical spondylarthritis    Headache(784.0)    High cholesterol    Leukocytosis 2009   Multiple sclerosis ([place]) 2009   followed by Dr. Erling Cruz   Rhinitis    Sinusitis        PAST SURGICAL HISTORY: Past Surgical History:  Procedure Laterality Date   CESAREAN SECTION     x 3   PARTIAL HYSTERECTOMY  2006   for uterine fibroid.      FAMILY HISTORY:  Family History  Problem Relation  Age of Onset   Cancer Mother 14       ovarian cancer; now with suspected breast cancer   Diabetes Mother        also HTN   Breast cancer Mother    Clotting disorder Father    Breast cancer Maternal Aunt    Cancer Maternal Grandmother        unknown female reproductive organ cancer   Breast cancer Maternal Aunt    Breast cancer Maternal Aunt    Colon cancer Sister 47       anal cancer (2014)   Lung cancer Sister 73   Stroke Sister 64   Ovarian cancer Sister 43   BRCA 1/2 Sister      SOCIAL HISTORY:  reports that she has never smoked. She has never used smokeless tobacco. She reports that she does not drink alcohol and does not use drugs.  The patient is married and resides in [place].  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 She's been able to work remotely since the covid pandemic, and has two teenage children. She's accompanied by her mother.   ALLERGIES: Procaine and Relafen [nabumetone]   MEDICATIONS:  Current Outpatient Medications  Medication Sig Dispense Refill   fluticasone (FLONASE) 50 MCG/ACT nasal spray Place 2 sprays into both nostrils daily. 16 g 2   ibuprofen (ADVIL) 400 MG tablet Take  1 tablet (400 mg total) by mouth every 8 (eight) hours as needed. 90 tablet 5   interferon beta-1a (AVONEX PREFILLED) 30 MCG/0.5ML PSKT injection INJECT ONE SYRINGE INTRAMUSCULARLY ONCE WEEKLY. REFRIGERATE. PROTECT FROM LIGHT. ALLOW TO WARM TO ROOM TEMPERATURE PRIOR TO USE. 3 each 3   Multiple Vitamins-Minerals (CENTRUM SILVER ADULT 50+ PO) Take by mouth.     simvastatin (ZOCOR) 20 MG tablet Take 1 tablet by mouth daily.     topiramate (TOPAMAX) 25 MG tablet Take 25 mg by mouth.     VENTOLIN HFA 108 (90 BASE) MCG/ACT inhaler Inhale 2 puffs into the lungs every 6 (six) hours as needed.     VITAMIN D PO Take 2 tablets by mouth daily. +calcium     No current facility-administered medications for this encounter.     REVIEW OF SYSTEMS: On review of  systems, the patient reports that she is doing well overall. She is doing pretty well overall. No specific complaints are verbalized.     PHYSICAL EXAM:  Wt Readings from Last 3 Encounters:  09/22/19 218 lb (98.9 kg)  03/25/19 223 lb (101.2 kg)  09/22/18 218 lb (98.9 kg)   Temp Readings from Last 3 Encounters:  03/25/19 (!) 97 F (36.1 C)  09/22/18 (!) 96.8 F (36 C)  06/24/17 98.4 F (36.9 C) (Oral)   BP Readings from Last 3 Encounters:  09/22/19 126/72  03/25/19 130/83  09/22/18 130/79   Pulse Readings from Last 3 Encounters:  09/22/19 82  03/25/19 97  09/22/18 79    In general this is a well appearing African American female in no acute distress. She's alert and oriented x4 and appropriate throughout the examination. Cardiopulmonary assessment is negative for acute distress and she exhibits normal effort. Bilateral breast exam is deferred.    ECOG = 0  0 - Asymptomatic (Fully active, able to carry on all predisease activities without restriction)  1 - Symptomatic but completely ambulatory (Restricted in physically strenuous activity but ambulatory and able to carry out work of a light or sedentary nature. For example, light housework, office work)  2 - Symptomatic, <50% in bed during the day (Ambulatory and capable of all self care but unable to carry out any work activities. Up and about more than 50% of waking hours)  3 - Symptomatic, >50% in bed, but not bedbound (Capable of only limited self-care, confined to bed or chair 50% or more of waking hours)  4 - Bedbound (Completely disabled. Cannot carry on any self-care. Totally confined to bed or chair)  5 - Death   Eustace Pen MM, Creech RH, Tormey DC, et al. [id number]). "Toxicity and response criteria of the [facility]". [place] Oncol. 5 (6): 649-55    LABORATORY DATA:  Lab Results  Component Value Date   WBC 10.4 09/22/2019   HGB 12.4 09/22/2019   HCT 37.5 09/22/2019   MCV 78 (L)  09/22/2019   PLT 329 09/22/2019   Lab Results  Component Value Date   NA 144 09/22/2019   K 3.6 09/22/2019   CL 107 (H) 09/22/2019   CO2 24 09/22/2019   Lab Results  Component Value Date   ALT 20 09/22/2019   AST 21 09/22/2019   ALKPHOS 106 09/22/2019   BILITOT 0.2 09/22/2019      RADIOGRAPHY: US BREAST LTD UNI RIGHT INC AXILLA  Addendum Date: 01/03/2020   ADDENDUM REPORT: 01/03/2020 12:41 ADDENDUM: The mass at 10:30, 2 cm from the nipple actually measures  1.3 x 1.8 x 1.1 cm. Electronically Signed   By: Dorise Bullion III M.D   On: 01/03/2020 12:41   Result Date: 01/03/2020 CLINICAL DATA:  Palpable lump in the right breast. EXAM: DIGITAL DIAGNOSTIC BILATERAL MAMMOGRAM WITH CAD AND TOMO ULTRASOUND RIGHT BREAST COMPARISON:  Previous exam(s). ACR Breast Density Category c: The breast tissue is heterogeneously dense, which may obscure small masses. FINDINGS: There is a mass in the region of the patient's palpable lump in the upper outer posterior right breast. There is a spiculated mass more anteriorly in the upper outer right breast. This spiculated mass measures up to 1.8 cm. There may be some obscured adjacent nodularity. There are calcifications in the tissue between the 2 masses in the upper outer right breast. No other suspicious mammographic findings in either breast. Mammographic images were processed with CAD. On physical exam, there is a lump in the upper outer posterior right breast. Targeted ultrasound is performed, showing an irregular suspicious mass in the right breast at 10:30, 2 cm from the nipple measuring 1.3 x 1.8 by 11.1 cm. A few mildly complicated cysts are seen in the adjacent tissue. The palpable lump correlates with the mass seen at 10:30, 7 cm from the nipple containing a calcification measuring 1.6 x 1.5 x 1.5 cm. No adenopathy. IMPRESSION: Two suspicious masses in the upper outer right breast at 10 30, 2 cm from the nipple and 10 30, 7 cm from the nipple. There are  calcifications between these 2 masses which are indeterminate. No adenopathy identified. No suspicious findings otherwise seen. RECOMMENDATION: Recommend ultrasound-guided biopsy of both 10 30 right breast masses. If the biopsies demonstrate malignancy, recommend breast MRI to assess extent of disease. If breast conservation is being considered after the breast MRI, recommend biopsying the calcifications between the 2 masses to fully excess extent. I have discussed the findings and recommendations with the patient. If applicable, a reminder letter will be sent to the patient regarding the next appointment. BI-RADS CATEGORY  5: Highly suggestive of malignancy. Electronically Signed: By: Dorise Bullion III M.D On: 12/23/2019 11:06   MM DIAG BREAST TOMO UNI LEFT  Result Date: 01/03/2020 CLINICAL DATA:  59 year old female with newly diagnosed right breast cancer presents for mammographic evaluation of the left breast. EXAM: DIGITAL DIAGNOSTIC UNILATERAL LEFT MAMMOGRAM WITH TOMO AND CAD COMPARISON:  Previous exam(s). ACR Breast Density Category b: There are scattered areas of fibroglandular density. FINDINGS: No suspicious mammographic findings are identified in the left breast. The parenchymal pattern is stable. Mammographic images were processed with CAD. IMPRESSION: No mammographic evidence of malignancy on the left. RECOMMENDATION: Per clinical treatment plan for known right breast malignancy. I have discussed the findings and recommendations with the patient. If applicable, a reminder letter will be sent to the patient regarding the next appointment. BI-RADS CATEGORY  1: Negative. Electronically Signed   By: Kristopher Oppenheim M.D.   On: 01/03/2020 15:20   MM DIAG BREAST TOMO UNI RIGHT  Addendum Date: 01/03/2020   ADDENDUM REPORT: 01/03/2020 12:41 ADDENDUM: The mass at 10:30, 2 cm from the nipple actually measures 1.3 x 1.8 x 1.1 cm. Electronically Signed   By: Dorise Bullion III M.D   On: 01/03/2020 12:41     Result Date: 01/03/2020 CLINICAL DATA:  Palpable lump in the right breast. EXAM: DIGITAL DIAGNOSTIC BILATERAL MAMMOGRAM WITH CAD AND TOMO ULTRASOUND RIGHT BREAST COMPARISON:  Previous exam(s). ACR Breast Density Category c: The breast tissue is heterogeneously dense, which may obscure small masses.  FINDINGS: There is a mass in the region of the patient's palpable lump in the upper outer posterior right breast. There is a spiculated mass more anteriorly in the upper outer right breast. This spiculated mass measures up to 1.8 cm. There may be some obscured adjacent nodularity. There are calcifications in the tissue between the 2 masses in the upper outer right breast. No other suspicious mammographic findings in either breast. Mammographic images were processed with CAD. On physical exam, there is a lump in the upper outer posterior right breast. Targeted ultrasound is performed, showing an irregular suspicious mass in the right breast at 10:30, 2 cm from the nipple measuring 1.3 x 1.8 by 11.1 cm. A few mildly complicated cysts are seen in the adjacent tissue. The palpable lump correlates with the mass seen at 10:30, 7 cm from the nipple containing a calcification measuring 1.6 x 1.5 x 1.5 cm. No adenopathy. IMPRESSION: Two suspicious masses in the upper outer right breast at 10 30, 2 cm from the nipple and 10 30, 7 cm from the nipple. There are calcifications between these 2 masses which are indeterminate. No adenopathy identified. No suspicious findings otherwise seen. RECOMMENDATION: Recommend ultrasound-guided biopsy of both 10 30 right breast masses. If the biopsies demonstrate malignancy, recommend breast MRI to assess extent of disease. If breast conservation is being considered after the breast MRI, recommend biopsying the calcifications between the 2 masses to fully excess extent. I have discussed the findings and recommendations with the patient. If applicable, a reminder letter will be sent to the  patient regarding the next appointment. BI-RADS CATEGORY  5: Highly suggestive of malignancy. Electronically Signed: By: Dorise Bullion III M.D On: 12/23/2019 11:06   MM CLIP PLACEMENT RIGHT  Result Date: 12/31/2019 CLINICAL DATA:  Status post ultrasound-guided core biopsies of 2 masses in the right breast. EXAM: 3D DIAGNOSTIC RIGHT MAMMOGRAM POST ULTRASOUND BIOPSIES COMPARISON:  Previous exam(s). FINDINGS: 3D Mammographic images were obtained following ultrasound guided biopsies of 2 masses in the right breast. There is a ribbon shaped and coil shaped clip in the upper-outer quadrant of the right breast. The clips are located 7 cm apart. IMPRESSION: Appropriate positioning of the ribbon and coil shaped clips in the upper-outer quadrant of the right breast. Final Assessment: Post Procedure Mammograms for Marker Placement Electronically Signed   By: Lillia Mountain M.D.   On: 12/31/2019 11:30   [place] RT BREAST BX W LOC DEV 1ST LESION IMG BX SPEC US GUIDE  Addendum Date: 01/03/2020   ADDENDUM REPORT: 01/03/2020 14:43 ADDENDUM: Pathology revealed GRADE II INVASIVE DUCTAL CARCINOMA, DUCTAL CARCINOMA IN SITU, of the RIGHT breast, 10:30 o'clock 2 CMFN. This was found to be concordant by Dr. Lillia Mountain. Pathology revealed GRADE III INVASIVE DUCTAL CARCINOMA, of the RIGHT breast, 10:30 o'clock 7 CMFN. This was found to be concordant by Dr. Lillia Mountain. Pathology results were discussed with the patient by telephone. The patient reported doing well after the biopsies with tenderness at the sites. Post biopsy instructions and care were reviewed and questions were answered. The patient was encouraged to call [facility] for any additional concerns. My direct phone number was provided. The patient was referred to [facility] on January 12, 2020, AM clinic for Dr. Nicholas Lose. The patient is scheduled for a LEFT breast  diagnostic mammogram on January 03, 2020. Further recommendations will be guided by the results of this mammogram. Recommendation  for a bilateral breast MRI for further evaluation of extent of disease given heterogeneously dense breast tissue and strong family history. Pathology results reported by Terie Purser, RN on 01/03/2020. Electronically Signed   By: Lillia Mountain M.D.   On: 01/03/2020 14:43   Result Date: 01/03/2020 CLINICAL DATA:  Suspicious right breast masses. EXAM: ULTRASOUND GUIDED RIGHT BREAST CORE NEEDLE BIOPSIES COMPARISON:  Previous exam(s). PROCEDURE: I met with the patient and we discussed the procedure of ultrasound-guided biopsy, including benefits and alternatives. We discussed the high likelihood of a successful procedure. We discussed the risks of the procedure, including infection, bleeding, tissue injury, clip migration, and inadequate sampling. Informed written consent was given. The usual time-out protocol was performed immediately prior to the procedure. Lesion quadrant: 10:30 2 cm from the nipple Using sterile technique and 1% lidocaine and 1% lidocaine with epinephrine as local anesthetic, under direct ultrasound visualization, a 14 gauge spring-loaded device was used to perform biopsy of a mass in the 10:30 region of the right breast 2 cm from the nipple using a lateral to medial approach. At the conclusion of the procedure ribbon shaped tissue marker clip was deployed into the biopsy cavity. Follow up 2 view mammogram was performed and dictated separately. I met with the patient and we discussed the procedure of ultrasound-guided biopsy, including benefits and alternatives. We discussed the high likelihood of a successful procedure. We discussed the risks of the procedure, including infection, bleeding, tissue injury, clip migration, and inadequate sampling. Informed written consent was given. The usual time-out protocol was performed immediately prior to the procedure. Lesion  quadrant: 10:30 7 cm from the nipple Using sterile technique and 1% lidocaine and 1% lidocaine with epinephrine as local anesthetic, under direct ultrasound visualization, a 12 gauge spring-loaded device was used to perform biopsy of a mass in the 10:30 region of the right breast 7 cm from the nipple using an inferior to superior approach. At the conclusion of the procedure coil shaped tissue marker clip was deployed into the biopsy cavity. Follow up 2 view mammogram was performed and dictated separately. IMPRESSION: Ultrasound guided biopsies of the right breast. No apparent complications. Electronically Signed: By: Lillia Mountain M.D. On: 12/31/2019 11:20   [place] RT BREAST BX W LOC DEV EA ADD LESION IMG BX SPEC US GUIDE  Addendum Date: 01/03/2020   ADDENDUM REPORT: 01/03/2020 14:43 ADDENDUM: Pathology revealed GRADE II INVASIVE DUCTAL CARCINOMA, DUCTAL CARCINOMA IN SITU, of the RIGHT breast, 10:30 o'clock 2 CMFN. This was found to be concordant by Dr. Lillia Mountain. Pathology revealed GRADE III INVASIVE DUCTAL CARCINOMA, of the RIGHT breast, 10:30 o'clock 7 CMFN. This was found to be concordant by Dr. Lillia Mountain. Pathology results were discussed with the patient by telephone. The patient reported doing well after the biopsies with tenderness at the sites. Post biopsy instructions and care were reviewed and questions were answered. The patient was encouraged to call [facility] for any additional concerns. My direct phone number was provided. The patient was referred to [facility] on January 12, 2020, AM clinic for Dr. Nicholas Lose. The patient is scheduled for a LEFT breast diagnostic mammogram on January 03, 2020. Further recommendations will be guided by the results of this mammogram. Recommendation for a bilateral breast MRI for further evaluation of extent of disease given heterogeneously dense breast tissue  and strong family history. Pathology results reported by Terie Purser, RN on 01/03/2020.  Electronically Signed   By: Lillia Mountain M.D.   On: 01/03/2020 14:43   Result Date: 01/03/2020 CLINICAL DATA:  Suspicious right breast masses. EXAM: ULTRASOUND GUIDED RIGHT BREAST CORE NEEDLE BIOPSIES COMPARISON:  Previous exam(s). PROCEDURE: I met with the patient and we discussed the procedure of ultrasound-guided biopsy, including benefits and alternatives. We discussed the high likelihood of a successful procedure. We discussed the risks of the procedure, including infection, bleeding, tissue injury, clip migration, and inadequate sampling. Informed written consent was given. The usual time-out protocol was performed immediately prior to the procedure. Lesion quadrant: 10:30 2 cm from the nipple Using sterile technique and 1% lidocaine and 1% lidocaine with epinephrine as local anesthetic, under direct ultrasound visualization, a 14 gauge spring-loaded device was used to perform biopsy of a mass in the 10:30 region of the right breast 2 cm from the nipple using a lateral to medial approach. At the conclusion of the procedure ribbon shaped tissue marker clip was deployed into the biopsy cavity. Follow up 2 view mammogram was performed and dictated separately. I met with the patient and we discussed the procedure of ultrasound-guided biopsy, including benefits and alternatives. We discussed the high likelihood of a successful procedure. We discussed the risks of the procedure, including infection, bleeding, tissue injury, clip migration, and inadequate sampling. Informed written consent was given. The usual time-out protocol was performed immediately prior to the procedure. Lesion quadrant: 10:30 7 cm from the nipple Using sterile technique and 1% lidocaine and 1% lidocaine with epinephrine as local anesthetic, under direct ultrasound visualization, a 12 gauge spring-loaded device was used to perform biopsy of a mass in the  10:30 region of the right breast 7 cm from the nipple using an inferior to superior approach. At the conclusion of the procedure coil shaped tissue marker clip was deployed into the biopsy cavity. Follow up 2 view mammogram was performed and dictated separately. IMPRESSION: Ultrasound guided biopsies of the right breast. No apparent complications. Electronically Signed: By: Lillia Mountain M.D. On: 12/31/2019 11:20       IMPRESSION/PLAN: 1. 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 Dr. [name] discusses the pathology findings and reviews the nature of triple negative breast disease. The consensus from the breast conference includes proceeding with neoadjuvant chemotherapy to determine her oncologic performance and response to chemotherapy, followed by  mastectomy plus or minus reconstruction. That being said the patient is motivated to consider breast conservation surgery if possible.  Dr. [name] discussed the rationale for certain scenarios in which postmastectomy radiotherapy would be offered, we do not anticipate a role for postmastectomy radiotherapy based on her work up [place], but did emphasize that if she was able to have breast conservation surgery this would be recommended. We will  follow-up with the final results of her surgical pathology before making final determinations. We discussed the risks, benefits, short, and long term effects of radiotherapy, including delivery and logistics in our discussion as well. She is in agreement with this plan. 2. History of BRCA2 mutation.  The patient will be counseled on the rationale for oophorectomy given her history of BRCA2 with hopes of avoiding ovarian cancer at the appropriate time.  In a visit lasting 60 minutes, greater than 50% of the time was spent face to face reviewing her case, as well as in preparation of, discussing, and coordinating the patient's care.  The above documentation reflects my direct  findings during this shared patient visit. Please see the  separate note by Dr. [name] on this date for the remainder of the patient's plan of care.    Carola Rhine, PAC

## 2020-01-12 NOTE — Research (Signed)
This patient was referred to the [facility]-16070/Testing the Addition of Two Approved Drugs, Olanzapine or Prochlorperazine, for Persistent Nausea by Dr. [name]. This Clinical Research Nurse met with Jacqueline Adams, [phone]  in a manner and location that ensures patient privacy to discuss participation in the above listed research study.  Patient is accompanied by her sister, [name].   A copy of the informed consent document and HIPAA authorization were provided to the patient.  Patient read, speaks, and understands [place].   Patient was provided with business card of the research nurse and encouraged to contact the research team with any questions.  Approximately 15 minutes  were spent with the patient reviewing the informed consent documents.  Patient provided the option of taking informed consent documents home to review and was encouraged to review at her convenience with her support network, including other care providers. Patient took the consent documents home to review.  The pt asked the research nurse to call her next week on Monday, 01/17/20 to discuss her participation in the study.  The pt was thanked for her interest in this research study.  Brion Aliment RN, BSN, CCRP Clinical Research Nurse 01/12/2020 12:53 PM

## 2020-01-13 ENCOUNTER — Telehealth: Payer: Self-pay | Admitting: Neurology

## 2020-01-13 ENCOUNTER — Telehealth: Payer: Self-pay | Admitting: Family Medicine

## 2020-01-13 ENCOUNTER — Encounter: Payer: Self-pay | Admitting: *Deleted

## 2020-01-13 NOTE — Telephone Encounter (Signed)
I called pt just to get some information prior to sending message.  She said she was just diagnosed with breast cancer.  Met with team yesterday.  ? About one of drugs interacting with her MS.  She wanted a call back.  [facility] information noted in epic.

## 2020-01-13 NOTE — Telephone Encounter (Signed)
She called that she was recently diagnosed with breast cancer (see below)  Because she is going to be going on cancer immunotherapy (pembrolizumab; checkpoint inhibitor) the Avonex can be discontinued if recommended by oncology.     Malignant neoplasm of upper-outer quadrant of right breast in female, estrogen receptor negative ([place]) 01/10/2020:Palpable right breast mass. Mammogram showed a 1.8cm mass at the 10:30 position 2cm from the nipple, a 1.5cm mass at the 10:30 position 7cm from the nipple, and indeterminate calcifications between the two masses in the right breast. 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  BRCA2 Mutation  Pathology and radiology counseling: Discussed with the patient, the details of pathology including the type of breast cancer,the clinical staging, the significance of ER, PR and HER-2/neu receptors and the implications for treatment. After reviewing the pathology in detail, we proceeded to discuss the different treatment options between surgery, radiation, chemotherapy, antiestrogen therapies.  Recommendation based on multidisciplinary tumor board: 1. Neoadjuvant chemotherapy with Adriamycin and Cytoxan with pembrolizumab every 3 weeks 4 followed by Taxol weekly 12 with carboplatin and pembrolizumab every 3 weeks x4  2. Followed by breast conserving surgery with sentinel lymph node study vs bilateral mastectomies 3. Followed by adjuvant radiation therapy if she undergoes breast conserving surgery 4.  Adjuvant pembrolizumab 5.  Adjuvant olaparib

## 2020-01-13 NOTE — Telephone Encounter (Signed)
Pt is asking for a call from Dr Felecia Shelling to discuss a change in her medical history. Pt states she has a new diagnosis and is needing to discuss, please call.

## 2020-01-14 ENCOUNTER — Telehealth: Payer: Self-pay | Admitting: Hematology and Oncology

## 2020-01-14 ENCOUNTER — Encounter: Payer: Self-pay | Admitting: Hematology and Oncology

## 2020-01-14 ENCOUNTER — Other Ambulatory Visit: Payer: Self-pay

## 2020-01-14 ENCOUNTER — Other Ambulatory Visit: Payer: BC Managed Care – PPO

## 2020-01-14 ENCOUNTER — Inpatient Hospital Stay: Payer: BC Managed Care – PPO

## 2020-01-14 ENCOUNTER — Ambulatory Visit
Admission: RE | Admit: 2020-01-14 | Discharge: 2020-01-14 | Disposition: A | Discharge status: Home / Self Care | Payer: BC Managed Care – PPO | Source: Ambulatory Visit | Attending: Hematology and Oncology | Admitting: Hematology and Oncology

## 2020-01-14 ENCOUNTER — Ambulatory Visit (HOSPITAL_COMMUNITY)
Admission: RE | Admit: 2020-01-14 | Discharge: 2020-01-14 | Disposition: A | Discharge status: Home / Self Care | Payer: BC Managed Care – PPO | Source: Ambulatory Visit | Attending: Hematology and Oncology | Admitting: Hematology and Oncology

## 2020-01-14 DIAGNOSIS — Z0189 Encounter for other specified special examinations: Secondary | ICD-10-CM

## 2020-01-14 DIAGNOSIS — Z171 Estrogen receptor negative status [ER-]: Secondary | ICD-10-CM | POA: Insufficient documentation

## 2020-01-14 DIAGNOSIS — C50411 Malignant neoplasm of upper-outer quadrant of right female breast: Secondary | ICD-10-CM | POA: Diagnosis not present

## 2020-01-14 DIAGNOSIS — E785 Hyperlipidemia, unspecified: Secondary | ICD-10-CM | POA: Diagnosis not present

## 2020-01-14 LAB — ECHOCARDIOGRAM COMPLETE
Area-P 1/2: 2.17 cm2
S' Lateral: 3.4 cm

## 2020-01-14 MED ORDER — GADOBUTROL 1 MMOL/ML IV SOLN
10.0000 mL | Freq: Once | INTRAVENOUS | Status: AC | PRN
  Administered 2020-01-14: 10 mL via INTRAVENOUS

## 2020-01-14 NOTE — Progress Notes (Signed)
Echocardiogram 2D Echocardiogram has been performed.  [place] Deputy Marieta Markov 01/14/2020, 9:36 AM

## 2020-01-14 NOTE — Progress Notes (Signed)
Met with patient at registration to introduce myself as Arboriculturist and to offer available resources.  Discussed one-time $1000 Radio broadcast assistant to assist with personal expenses while going through treatment.  Also discussed possible available copay assistance for specific treatment drugs if she will be left with a co-insurance after insurance pays.  Gave her my card if interested in applying and for any additional financial questions or concerns.

## 2020-01-14 NOTE — Telephone Encounter (Signed)
No 12/1 los, no changes made to pt schedule

## 2020-01-17 ENCOUNTER — Encounter: Payer: Self-pay | Admitting: *Deleted

## 2020-01-17 ENCOUNTER — Telehealth: Payer: Self-pay | Admitting: *Deleted

## 2020-01-17 ENCOUNTER — Other Ambulatory Visit: Payer: Self-pay | Admitting: *Deleted

## 2020-01-17 DIAGNOSIS — Z171 Estrogen receptor negative status [ER-]: Secondary | ICD-10-CM

## 2020-01-17 DIAGNOSIS — C50411 Malignant neoplasm of upper-outer quadrant of right female breast: Secondary | ICD-10-CM

## 2020-01-17 NOTE — Telephone Encounter (Signed)
Spoke to pt concerning Jacqueline Adams from 12.1.21. Denies questions or concerns regarding dx or treatment care plan. Discussed MRI results and need for MR bx and stereo bx. Received verbal understanding. No further needs or questions voiced. Encourage pt to call with needs.

## 2020-01-18 ENCOUNTER — Other Ambulatory Visit: Payer: Self-pay | Admitting: Hematology and Oncology

## 2020-01-18 ENCOUNTER — Encounter: Payer: Self-pay | Admitting: *Deleted

## 2020-01-18 ENCOUNTER — Other Ambulatory Visit: Payer: Self-pay | Admitting: Surgery

## 2020-01-18 DIAGNOSIS — C50411 Malignant neoplasm of upper-outer quadrant of right female breast: Secondary | ICD-10-CM

## 2020-01-18 DIAGNOSIS — Z171 Estrogen receptor negative status [ER-]: Secondary | ICD-10-CM

## 2020-01-20 ENCOUNTER — Encounter: Payer: Self-pay | Admitting: *Deleted

## 2020-01-24 ENCOUNTER — Encounter: Payer: Self-pay | Admitting: *Deleted

## 2020-01-24 ENCOUNTER — Ambulatory Visit: Payer: Self-pay | Admitting: Surgery

## 2020-01-24 ENCOUNTER — Inpatient Hospital Stay: Admission: RE | Admit: 2020-01-24 | Payer: BC Managed Care – PPO | Source: Ambulatory Visit

## 2020-01-26 ENCOUNTER — Encounter: Payer: Self-pay | Admitting: *Deleted

## 2020-01-27 ENCOUNTER — Other Ambulatory Visit (HOSPITAL_COMMUNITY): Payer: Self-pay | Admitting: Diagnostic Radiology

## 2020-01-28 ENCOUNTER — Ambulatory Visit
Admission: RE | Admit: 2020-01-28 | Discharge: 2020-01-28 | Disposition: A | Discharge status: Home / Self Care | Payer: BC Managed Care – PPO | Source: Ambulatory Visit | Attending: Hematology and Oncology | Admitting: Hematology and Oncology

## 2020-01-28 ENCOUNTER — Other Ambulatory Visit: Payer: Self-pay

## 2020-01-28 ENCOUNTER — Other Ambulatory Visit: Payer: BC Managed Care – PPO

## 2020-01-28 DIAGNOSIS — C50411 Malignant neoplasm of upper-outer quadrant of right female breast: Secondary | ICD-10-CM

## 2020-01-28 HISTORY — PX: BREAST BIOPSY: SHX20

## 2020-01-28 MED ORDER — GADOBENATE DIMEGLUMINE 529 MG/ML IV SOLN
10.0000 mL | Freq: Once | INTRAVENOUS | Status: AC | PRN
  Administered 2020-01-28: 09:00:00 10 mL via INTRAVENOUS

## 2020-01-31 ENCOUNTER — Encounter: Payer: Self-pay | Admitting: *Deleted

## 2020-02-02 ENCOUNTER — Other Ambulatory Visit: Payer: Self-pay

## 2020-02-02 ENCOUNTER — Ambulatory Visit
Admission: RE | Admit: 2020-02-02 | Discharge: 2020-02-02 | Disposition: A | Discharge status: Home / Self Care | Payer: BC Managed Care – PPO | Source: Ambulatory Visit | Attending: Surgery | Admitting: Surgery

## 2020-02-02 DIAGNOSIS — Z171 Estrogen receptor negative status [ER-]: Secondary | ICD-10-CM

## 2020-02-02 DIAGNOSIS — C50411 Malignant neoplasm of upper-outer quadrant of right female breast: Secondary | ICD-10-CM

## 2020-02-02 HISTORY — PX: BREAST BIOPSY: SHX20

## 2020-02-02 NOTE — Progress Notes (Signed)
Patient Care Team: Robyne Peers, MD as PCP - General (Family Medicine) Love, Alyson Locket, MD (Neurology) Jerrell Belfast, MD Mauro Kaufmann, RN as Oncology Nurse Navigator Rockwell Germany, RN as Oncology Nurse Navigator Donnie Mesa, MD as Consulting Physician (General Surgery) Nicholas Lose, MD as Consulting Physician (Hematology and Oncology) Kyung Rudd, MD as Consulting Physician (Radiation Oncology)  DIAGNOSIS:    ICD-10-CM   1. Malignant neoplasm of upper-outer quadrant of right breast in female, estrogen receptor negative ([place])  C50.411    Z17.1     SUMMARY OF ONCOLOGIC HISTORY: Oncology History  Malignant neoplasm of upper-outer quadrant of right breast in female, estrogen receptor negative ([place])  01/10/2020 Initial Diagnosis   Palpable right breast mass. Mammogram showed a 1.8cm mass at the 10:30 position 2cm from the nipple, a 1.5cm mass at the 10:30 position 7cm from the nipple, and indeterminate calcifications between the two masses in the right breast. 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    01/12/2020 Cancer Staging   Staging form: Breast, AJCC 8th Edition - Clinical stage from 01/12/2020: Stage IB (cT1c, cN0, cM0, G2, ER-, PR-, HER2-) - Signed by Nicholas Lose, MD on 01/12/2020   01/26/2020 -  Chemotherapy   The patient had dexamethasone (DECADRON) 4 MG tablet, 1 of 1 cycle, Start date: 01/12/2020, End date: -- DOXOrubicin (ADRIAMYCIN) chemo injection 126 mg, 60 mg/m2 = 126 mg, Intravenous,  Once, 0 of 4 cycles palonosetron (ALOXI) injection 0.25 mg, 0.25 mg, Intravenous,  Once, 0 of 8 cycles pegfilgrastim-jmdb (FULPHILA) injection 6 mg, 6 mg, Subcutaneous,  Once, 0 of 4 cycles CARBOplatin (PARAPLATIN) 700 mg in sodium chloride 0.9 % 250 mL chemo infusion, 700 mg (100 % of original dose 700 mg), Intravenous,  Once, 0 of 4 cycles Dose modification: 700 mg (original dose 700 mg, Cycle  5) cyclophosphamide (CYTOXAN) 1,260 mg in sodium chloride 0.9 % 250 mL chemo infusion, 600 mg/m2 = 1,260 mg, Intravenous,  Once, 0 of 4 cycles PACLitaxel (TAXOL) 168 mg in sodium chloride 0.9 % 250 mL chemo infusion (</= 50m/m2), 80 mg/m2 = 168 mg, Intravenous,  Once, 0 of 4 cycles fosaprepitant (EMEND) 150 mg in sodium chloride 0.9 % 145 mL IVPB, 150 mg, Intravenous,  Once, 0 of 8 cycles pembrolizumab (KEYTRUDA) 200 mg in sodium chloride 0.9 % 50 mL chemo infusion, 200 mg (100 % of original dose 200 mg), Intravenous, Once, 0 of 8 cycles Dose modification: 200 mg (original dose 200 mg, Cycle 1, Reason: Provider Judgment)  for chemotherapy treatment.      CHIEF COMPLIANT: Follow-up of right breast cancer  INTERVAL HISTORY: Jacqueline Adams a 59y.o. with above-mentioned history of BRCA2 mutation and right breast cancer. She is currently on neoadjuvant chemotherapy with Adriamycin and Cytoxan with pembrolizumab. Echo on 01/14/20 showed an ejection fraction of 55-60%. Breast MRI on 01/14/20 showed the two biopsy proven malignancies, and a 0.7cm mass 1.8cm from the posterior mass. 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 She presents to the clinic today for follow-up. She wanted to discuss the treatment plan once again with [facility]aprior to proceeding with port placement.   ALLERGIES:  is allergic to procaine and relafen [nabumetone].  MEDICATIONS:  Current Outpatient Medications  Medication Sig Dispense Refill  . dexamethasone (DECADRON) 4 MG tablet Take 1 tablet day before chemo and 1 tablet day after chemo with food 12 tablet 0  .  fluticasone (FLONASE) 50 MCG/ACT nasal spray Place 2 sprays into both nostrils daily. 16 g 2  . ibuprofen (ADVIL) 400 MG tablet Take 1 tablet (400 mg total) by mouth every 8 (eight) hours as needed. 90 tablet 5  . interferon beta-1a (AVONEX PREFILLED) 30 MCG/0.5ML PSKT injection INJECT ONE SYRINGE INTRAMUSCULARLY ONCE WEEKLY. REFRIGERATE. PROTECT FROM  LIGHT. ALLOW TO WARM TO ROOM TEMPERATURE PRIOR TO USE. 3 each 3  . lidocaine-prilocaine (EMLA) cream Apply to affected area once 30 g 3  . Multiple Vitamins-Minerals (CENTRUM SILVER ADULT 50+ PO) Take by mouth.    . ondansetron (ZOFRAN) 8 MG tablet Take 1 tablet (8 mg total) by mouth 2 (two) times daily as needed. Start on the third day after chemotherapy. 30 tablet 1  . prochlorperazine (COMPAZINE) 10 MG tablet Take 1 tablet (10 mg total) by mouth every 6 (six) hours as needed (Nausea or vomiting). 30 tablet 1  . simvastatin (ZOCOR) 20 MG tablet Take 1 tablet by mouth daily.    [name] topiramate (TOPAMAX) 25 MG tablet Take 25 mg by mouth.    . VENTOLIN HFA 108 (90 BASE) MCG/ACT inhaler Inhale 2 puffs into the lungs every 6 (six) hours as needed.    [name] VITAMIN D PO Take 2 tablets by mouth daily. +calcium     No current facility-administered medications for this visit.    PHYSICAL EXAMINATION: ECOG PERFORMANCE STATUS: 1 - Symptomatic but completely ambulatory  Vitals:   02/03/20 0942  BP: 127/63  Pulse: 85  Resp: 17  Temp: 98.6 F (37 C)  SpO2: 100%   Filed Weights   02/03/20 0942  Weight: 216 lb 4.8 oz (98.1 kg)    LABORATORY DATA:  I have reviewed the data as listed CMP Latest Ref Rng & Units 01/12/2020 09/22/2019 03/25/2019  Glucose 70 - 99 mg/dL 113(H) 118(H) 103(H)  BUN 6 - 20 mg/dL _0 Creatinine 0.44 - 1.00 mg/dL 0.73 0.69 0.68  Sodium 135 - 145 mmol/L 143 144 143  Potassium 3.5 - 5.1 mmol/L 3.5 3.6 3.5  Chloride 98 - 111 mmol/L 110 107(H) 104  CO2 22 - 32 mmol/L _1 Calcium 8.9 - 10.3 mg/dL 9.4 9.2 9.2  Total Protein 6.5 - 8.1 g/dL 7.7 7.0 6.8  Total Bilirubin 0.3 - 1.2 mg/dL 0.3 0.2 0.4  Alkaline Phos 38 - 126 U/L 96 106 98  AST 15 - 41 U/L _2 ALT 0 - 44 U/L _3 Lab Results  Component Value Date   WBC 13.1 (H) 01/12/2020   HGB 12.3 01/12/2020   HCT 38.3 01/12/2020   MCV 77.4 (L) 01/12/2020   PLT 330 01/12/2020   NEUTROABS 8.1 (H)  01/12/2020    ASSESSMENT & PLAN:  Malignant neoplasm of upper-outer quadrant of right breast in female, estrogen receptor negative ([place]) 01/10/2020:Palpable right breast mass. Mammogram showed a 1.8cm mass at the 10:30 position 2cm from the nipple, a 1.5cm mass at the 10:30 position 7cm from the nipple, and indeterminate calcifications between the two masses in the right breast. 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  BRCA2 Mutation  Treatment plan: 1. Neoadjuvant chemotherapy with Adriamycin and Cytoxan with pembrolizumab every 3 weeks 4 followed by Taxol weekly 12 with carboplatin and pembrolizumab every 3 weeks x4  2. Followed by breast conserving surgery with sentinel lymph node study vs bilateral mastectomies 3.  Followed by adjuvant radiation therapy if she undergoes breast conserving surgery 4.  Adjuvant pembrolizumab 5.  Adjuvant olaparib URCC nausea study ----------------------------------------------------------------------------------------------------------------------------------------------- Current treatment: Cycle 1 neoadjuvant Adriamycin and Cytoxan with pembrolizumab every 3 weeks will start 02/23/2020 Labs reviewed, chemo education completed, chemo consent obtained Echocardiogram 01/14/2020: EF 55 to 60% Breast MRI 01/14/2020: 2 cm spiculated mass UOQ right breast, 2.1 cm spiculated mass UOQ right breast, biopsy-proven malignancies.  7 mm suspicious enhancing mass: Biopsy 02/02/2020  Extensive discussion about treatment plan.  Patient wants to consider neoadjuvant chemo followed by lumpectomy if it is possible. She took some time to digest all this information and finally decided that she is willing to proceed with the treatment.  Return to clinic to start chemotherapy   No orders of the defined types were placed in this encounter.  The patient has a good understanding of the overall plan. she  agrees with it. she will call with any problems that may develop before the next visit here.  Total time spent: 30 mins including face to face time and time spent for planning, charting and coordination of care  Nicholas Lose, MD 02/03/2020  I, Cloyde Reams Dorshimer, am acting as scribe for Dr. Nicholas Lose.  I have reviewed the above documentation for accuracy and completeness, and I agree with the above.

## 2020-02-03 ENCOUNTER — Other Ambulatory Visit: Payer: Self-pay

## 2020-02-03 ENCOUNTER — Inpatient Hospital Stay: Payer: BC Managed Care – PPO | Admitting: Hematology and Oncology

## 2020-02-03 ENCOUNTER — Encounter: Payer: Self-pay | Admitting: *Deleted

## 2020-02-03 DIAGNOSIS — C50411 Malignant neoplasm of upper-outer quadrant of right female breast: Secondary | ICD-10-CM

## 2020-02-03 DIAGNOSIS — Z171 Estrogen receptor negative status [ER-]: Secondary | ICD-10-CM | POA: Diagnosis not present

## 2020-02-03 NOTE — Research (Signed)
02/03/20 at 10:27am - Research studies- [id number] and DCP-001- The research nurse called the pt on 02/02/20 to see if the pt had any questions/concerns about the [id number] (nausea study). The pt said that she did not want to participate in this study.  The pt was thanked for her consideration of this study.  This morning the research nurse met briefly with the pt to discuss the DCP-001 study. The pt was informed that all patients referred for a NCI study are then offered to participate in the DCP-001 study whether or not they enroll in a study or not.  The pt was asked if she would like to read the DCP-001 study consent form and hippa.  The pt was told that her participation in the study was completely voluntary.  The pt was told that it is a one-time collection of information to better understand the clinical trial participation process.  The pt agreed to read the consent form and hipaa form.  The research nurse will meet with the pt on 02/23/20 at her first chemo appt to see if she has any questions/concerns about the DCP-001 study.  The pt was again thanked for her time and consideration of this study. Brion Aliment RN, BSN, CCRP  Clinical Research Nurse 02/03/2020 10:38 AM

## 2020-02-03 NOTE — Assessment & Plan Note (Signed)
01/10/2020:Palpable right breast mass. Mammogram showed a 1.8cm mass at the 10:30 position 2cm from the nipple, a 1.5cm mass at the 10:30 position 7cm from the nipple, and indeterminate calcifications between the two masses in the right breast. 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  BRCA2 Mutation  Treatment plan: 1. Neoadjuvant chemotherapy with Adriamycin and Cytoxan with pembrolizumab every 3 weeks 4 followed by Taxol weekly 12 with carboplatin and pembrolizumab every 3 weeks x4  2. Followed by breast conserving surgery with sentinel lymph node study vs bilateral mastectomies 3. Followed by adjuvant radiation therapy if she undergoes breast conserving surgery 4.  Adjuvant pembrolizumab 5.  Adjuvant olaparib URCC nausea study ----------------------------------------------------------------------------------------------------------------------------------------------- Current treatment: Cycle 1 neoadjuvant Adriamycin and Cytoxan with pembrolizumab every 3 weeks Labs reviewed, chemo education completed, chemo consent obtained Echocardiogram 01/14/2020: EF 55 to 60% Breast MRI 01/14/2020: 2 cm spiculated mass UOQ right breast, 2.1 cm spiculated mass UOQ right breast, biopsy-proven malignancies.  7 mm suspicious enhancing mass: Biopsy 02/02/2020  Return to clinic in 1 week for toxicity check

## 2020-02-07 ENCOUNTER — Telehealth: Payer: Self-pay | Admitting: Hematology and Oncology

## 2020-02-07 NOTE — Telephone Encounter (Signed)
No 12/23 los, no changes made to pt schedule 

## 2020-02-08 ENCOUNTER — Encounter: Payer: Self-pay | Admitting: Hematology and Oncology

## 2020-02-08 NOTE — Progress Notes (Signed)
Patient called and left voicemail regarding [employer].  Called patient to discuss details of grant and what is needed to apply. Provided gross household income guidelines verbally. Based on income guidelines, patient states they exceed.  Discussed available copay assistance for Keytruda if needed for new year. Patient gave me permission to apply on her behalf after 02/12/20. Advised I would contact her to advise what is needed to apply on her behalf. She verbalized understanding.  Patient also states she has new insurance. Asked her if she could take a picture of the new card and send via email. She did. I emailed a copy to chemo auth team for authorization purposes.   She has my card for any additional financial questions or concerns.

## 2020-02-14 ENCOUNTER — Other Ambulatory Visit: Payer: Self-pay

## 2020-02-14 MED ORDER — PEGFILGRASTIM INJECTION 6 MG/0.6ML ~~LOC~~: 6.0000 mg | PREFILLED_SYRINGE | Freq: Once | SUBCUTANEOUS | 3 refills | Status: AC

## 2020-02-15 ENCOUNTER — Telehealth: Payer: Self-pay

## 2020-02-15 NOTE — Telephone Encounter (Signed)
RN obtained information from pharmacy staff that patient's insurance is requiring administration of GCSF support at home.    RN successfully faxed prescription, demographics, and insurance infromation to [facility] at [phone].   Pt is aware of change.  RN will follow up with [facility] for status and rely to patient.

## 2020-02-16 ENCOUNTER — Encounter (HOSPITAL_BASED_OUTPATIENT_CLINIC_OR_DEPARTMENT_OTHER): Payer: Self-pay | Admitting: Surgery

## 2020-02-16 ENCOUNTER — Other Ambulatory Visit: Payer: Self-pay

## 2020-02-16 NOTE — Progress Notes (Signed)
Pharmacist Chemotherapy Monitoring - Initial Assessment    Anticipated start date: 02/23/2020   Regimen:  . Are orders appropriate based on the patient's diagnosis, regimen, and cycle? Yes . Does the plan date match the patient's scheduled date? Yes . Is the sequencing of drugs appropriate? Yes . Are the premedications appropriate for the patient's regimen? yes . Prior Authorization for treatment is: Approved o If applicable, is the correct biosimilar selected based on the patient's insurance? not applicable  Organ Function and Labs: [name] Are dose adjustments needed based on the patient's renal function, hepatic function, or hematologic function? Yes . Are appropriate labs ordered prior to the start of patient's treatment? Yes . Other organ system assessment, if indicated: anthracyclines: Echo/ MUGA . The following baseline labs, if indicated, have been ordered: pembrolizumab: baseline TSH +/- T4  Dose Assessment: . Are the drug doses appropriate? Yes . Are the following correct: o Drug concentrations Yes o IV fluid compatible with drug Yes o Administration routes Yes o Timing of therapy Yes . If applicable, does the patient have documented access for treatment and/or plans for port-a-cath placement? no . If applicable, have lifetime cumulative doses been properly documented and assessed? not applicable Lifetime Dose Tracking  No doses have been documented on this patient for the following tracked chemicals: Doxorubicin, Epirubicin, Idarubicin, Daunorubicin, Mitoxantrone, Bleomycin, Oxaliplatin, Carboplatin, Liposomal Doxorubicin  o   Toxicity Monitoring/Prevention: . The patient has the following take home antiemetics prescribed: Ondansetron and Prochlorperazine . The patient has the following take home medications prescribed: N/A . Medication allergies and previous infusion related reactions, if applicable, have been reviewed and addressed. No . The patient's current medication list  has been assessed for drug-drug interactions with their chemotherapy regimen. no significant drug-drug interactions were identified on review.  Order Review: . Are the treatment plan orders signed? Yes . Is the patient scheduled to see a provider prior to their treatment? Yes  I verify that I have reviewed each item in the above checklist and answered each question accordingly.  Shatima Zalar D 02/16/2020 9:51 AM

## 2020-02-18 ENCOUNTER — Other Ambulatory Visit (HOSPITAL_COMMUNITY)
Admission: RE | Admit: 2020-02-18 | Discharge: 2020-02-18 | Disposition: A | Discharge status: Home / Self Care | Payer: 59 | Source: Ambulatory Visit | Attending: Surgery | Admitting: Surgery

## 2020-02-18 ENCOUNTER — Encounter: Payer: Self-pay | Admitting: *Deleted

## 2020-02-18 DIAGNOSIS — Z01812 Encounter for preprocedural laboratory examination: Secondary | ICD-10-CM | POA: Diagnosis present

## 2020-02-18 DIAGNOSIS — Z20822 Contact with and (suspected) exposure to covid-19: Secondary | ICD-10-CM | POA: Diagnosis not present

## 2020-02-18 LAB — SARS CORONAVIRUS 2 (TAT 6-24 HRS): SARS Coronavirus 2: NEGATIVE

## 2020-02-18 NOTE — Progress Notes (Signed)
[facility] stating [facility] are not the pt's preferred speciality pharmacy for Neulasta 6mg /0.6 mL injection.  States prescription was sent to [facility] [phone]). RN placed call to [facility] and was told that pt insurance prefers Ziextenzo.  RN reviewed with MD and per MD request verbal order given to pharmacist for Ziextenzo 6mg /0.6 mL with the instructions for pt to self inject Ziextenzo 6mg /0.66mL into subcutaneous tissue 24 hours after chemotherapy for a total of 4 cycles (each cycle is 21 days). Pharmacist verbalized order understanding and states PA is still needed for home injection.  RN contacted PA specialist with our office to submit PA and will take up to 72 days to receive a response. Pt due for first home injection on 02/25/2020.

## 2020-02-21 ENCOUNTER — Telehealth: Payer: Self-pay | Admitting: *Deleted

## 2020-02-21 NOTE — Telephone Encounter (Signed)
Received PA approval for Ziextenzo from [facility].  Pt notified and states [facility] has [facility] to [facility] up [facility] delivery.  [facility] she is going to [facility] to confirm delivery and review administration instructions.

## 2020-02-21 NOTE — Progress Notes (Signed)
      Enhanced Recovery after Surgery for Orthopedics Enhanced Recovery after Surgery is a protocol used to improve the stress on your body and your recovery after surgery.  Patient Instructions  . The night before surgery:  o No food after midnight. ONLY clear liquids after midnight  . The day of surgery (if you do NOT have diabetes):  o Drink ONE (1) Pre-Surgery Clear Ensure as directed.   o This drink was given to you during your hospital  pre-op appointment visit. o The pre-op nurse will instruct you on the time to drink the  Pre-Surgery Ensure depending on your surgery time. o Finish the drink at the designated time by the pre-op nurse.  o Nothing else to drink after completing the  Pre-Surgery Clear Ensure.  . The day of surgery (if you have diabetes): o Drink ONE (1) Gatorade 2 (G2) as directed. o This drink was given to you during your hospital  pre-op appointment visit.  o The pre-op nurse will instruct you on the time to drink the   Gatorade 2 (G2) depending on your surgery time. o Color of the Gatorade may vary. Red is not allowed. o Nothing else to drink after completing the  Gatorade 2 (G2).         If you have questions, please contact your surgeon's office.  Given surgical soap and instructions. Verbalized understanding. 

## 2020-02-22 ENCOUNTER — Ambulatory Visit (HOSPITAL_BASED_OUTPATIENT_CLINIC_OR_DEPARTMENT_OTHER)
Admission: RE | Admit: 2020-02-22 | Discharge: 2020-02-22 | Disposition: A | Discharge status: Home / Self Care | Payer: 59 | Attending: Surgery | Admitting: Surgery

## 2020-02-22 ENCOUNTER — Other Ambulatory Visit: Payer: Self-pay

## 2020-02-22 ENCOUNTER — Encounter (HOSPITAL_BASED_OUTPATIENT_CLINIC_OR_DEPARTMENT_OTHER)
Admission: RE | Disposition: A | Discharge status: Home / Self Care | Payer: Self-pay | Source: Home / Self Care | Attending: Surgery

## 2020-02-22 ENCOUNTER — Encounter (HOSPITAL_BASED_OUTPATIENT_CLINIC_OR_DEPARTMENT_OTHER): Payer: Self-pay | Admitting: Surgery

## 2020-02-22 ENCOUNTER — Ambulatory Visit (HOSPITAL_COMMUNITY): Payer: 59

## 2020-02-22 ENCOUNTER — Ambulatory Visit (HOSPITAL_BASED_OUTPATIENT_CLINIC_OR_DEPARTMENT_OTHER): Payer: 59 | Admitting: Anesthesiology

## 2020-02-22 DIAGNOSIS — Z452 Encounter for adjustment and management of vascular access device: Secondary | ICD-10-CM

## 2020-02-22 DIAGNOSIS — Z888 Allergy status to other drugs, medicaments and biological substances status: Secondary | ICD-10-CM | POA: Insufficient documentation

## 2020-02-22 DIAGNOSIS — Z886 Allergy status to analgesic agent status: Secondary | ICD-10-CM | POA: Insufficient documentation

## 2020-02-22 DIAGNOSIS — C50911 Malignant neoplasm of unspecified site of right female breast: Secondary | ICD-10-CM | POA: Diagnosis present

## 2020-02-22 DIAGNOSIS — Z1501 Genetic susceptibility to malignant neoplasm of breast: Secondary | ICD-10-CM | POA: Insufficient documentation

## 2020-02-22 DIAGNOSIS — Z8041 Family history of malignant neoplasm of ovary: Secondary | ICD-10-CM | POA: Insufficient documentation

## 2020-02-22 DIAGNOSIS — Z803 Family history of malignant neoplasm of breast: Secondary | ICD-10-CM | POA: Insufficient documentation

## 2020-02-22 DIAGNOSIS — Z419 Encounter for procedure for purposes other than remedying health state, unspecified: Secondary | ICD-10-CM

## 2020-02-22 DIAGNOSIS — Z1509 Genetic susceptibility to other malignant neoplasm: Secondary | ICD-10-CM | POA: Insufficient documentation

## 2020-02-22 DIAGNOSIS — Z1502 Genetic susceptibility to malignant neoplasm of ovary: Secondary | ICD-10-CM | POA: Diagnosis not present

## 2020-02-22 DIAGNOSIS — Z800 Family history of malignant neoplasm of digestive organs: Secondary | ICD-10-CM | POA: Diagnosis not present

## 2020-02-22 HISTORY — PX: PORTACATH PLACEMENT: SHX2246

## 2020-02-22 HISTORY — DX: Other seasonal allergic rhinitis: J30.2

## 2020-02-22 SURGERY — INSERTION, TUNNELED CENTRAL VENOUS DEVICE, WITH PORT
Anesthesia: General | Site: Chest | Laterality: Right

## 2020-02-22 MED ORDER — HYDROCODONE-ACETAMINOPHEN 5-325 MG PO TABS: 1.0000 | ORAL_TABLET | Freq: Four times a day (QID) | ORAL | 0 refills | Status: DC | PRN

## 2020-02-22 MED ORDER — FENTANYL CITRATE (PF) 100 MCG/2ML IJ SOLN
INTRAMUSCULAR | Status: DC | PRN
  Administered 2020-02-22 (×2): 50 ug via INTRAVENOUS

## 2020-02-22 MED ORDER — ACETAMINOPHEN 10 MG/ML IV SOLN: 1000.0000 mg | Freq: Once | INTRAVENOUS | Status: DC | PRN

## 2020-02-22 MED ORDER — BUPIVACAINE-EPINEPHRINE 0.25% -1:200000 IJ SOLN
INTRAMUSCULAR | Status: DC | PRN
  Administered 2020-02-22: 7 mL

## 2020-02-22 MED ORDER — PHENYLEPHRINE HCL (PRESSORS) 10 MG/ML IV SOLN
INTRAVENOUS | Status: DC | PRN
  Administered 2020-02-22 (×2): 80 ug via INTRAVENOUS

## 2020-02-22 MED ORDER — PROPOFOL 500 MG/50ML IV EMUL
INTRAVENOUS | Status: AC
  Filled 2020-02-22: qty 50

## 2020-02-22 MED ORDER — FENTANYL CITRATE (PF) 100 MCG/2ML IJ SOLN
INTRAMUSCULAR | Status: AC
  Filled 2020-02-22: qty 2

## 2020-02-22 MED ORDER — HEPARIN SOD (PORK) LOCK FLUSH 100 UNIT/ML IV SOLN
INTRAVENOUS | Status: DC | PRN
  Administered 2020-02-22: 500 [IU] via INTRAVENOUS

## 2020-02-22 MED ORDER — HEPARIN (PORCINE) IN NACL 2-0.9 UNITS/ML
INTRAMUSCULAR | Status: AC | PRN
  Administered 2020-02-22: 1 via INTRAVENOUS

## 2020-02-22 MED ORDER — PROPOFOL 10 MG/ML IV BOLUS
INTRAVENOUS | Status: DC | PRN
  Administered 2020-02-22: 200 mg via INTRAVENOUS

## 2020-02-22 MED ORDER — CHLORHEXIDINE GLUCONATE CLOTH 2 % EX PADS: 6.0000 | MEDICATED_PAD | Freq: Once | CUTANEOUS | Status: DC

## 2020-02-22 MED ORDER — DEXAMETHASONE SODIUM PHOSPHATE 4 MG/ML IJ SOLN
INTRAMUSCULAR | Status: DC | PRN
  Administered 2020-02-22: 5 mg via INTRAVENOUS

## 2020-02-22 MED ORDER — CEFAZOLIN SODIUM-DEXTROSE 2-4 GM/100ML-% IV SOLN
2.0000 g | INTRAVENOUS | Status: AC
  Administered 2020-02-22: 2 g via INTRAVENOUS

## 2020-02-22 MED ORDER — LIDOCAINE HCL (CARDIAC) PF 100 MG/5ML IV SOSY
PREFILLED_SYRINGE | INTRAVENOUS | Status: DC | PRN
  Administered 2020-02-22: 60 mg via INTRAVENOUS

## 2020-02-22 MED ORDER — MIDAZOLAM HCL 5 MG/5ML IJ SOLN
INTRAMUSCULAR | Status: DC | PRN
  Administered 2020-02-22: 2 mg via INTRAVENOUS

## 2020-02-22 MED ORDER — LACTATED RINGERS IV SOLN: INTRAVENOUS | Status: DC

## 2020-02-22 MED ORDER — ONDANSETRON HCL 4 MG/2ML IJ SOLN: 4.0000 mg | Freq: Once | INTRAMUSCULAR | Status: DC | PRN

## 2020-02-22 MED ORDER — CEFAZOLIN SODIUM-DEXTROSE 2-4 GM/100ML-% IV SOLN
INTRAVENOUS | Status: AC
  Filled 2020-02-22: qty 100

## 2020-02-22 MED ORDER — HYDROMORPHONE HCL 1 MG/ML IJ SOLN: 0.2500 mg | INTRAMUSCULAR | Status: DC | PRN

## 2020-02-22 MED ORDER — LIDOCAINE 2% (20 MG/ML) 5 ML SYRINGE
INTRAMUSCULAR | Status: AC
  Filled 2020-02-22: qty 5

## 2020-02-22 MED ORDER — MIDAZOLAM HCL 2 MG/2ML IJ SOLN
INTRAMUSCULAR | Status: AC
  Filled 2020-02-22: qty 2

## 2020-02-22 SURGICAL SUPPLY — 51 items
APL PRP STRL LF DISP 70% ISPRP (MISCELLANEOUS) ×1
APL SKNCLS STERI-STRIP NONHPOA (GAUZE/BANDAGES/DRESSINGS) ×1
BAG DECANTER FOR FLEXI CONT (MISCELLANEOUS) ×2 IMPLANT
BENZOIN TINCTURE PRP APPL 2/3 (GAUZE/BANDAGES/DRESSINGS) ×2 IMPLANT
BLADE SURG 11 STRL SS (BLADE) ×2 IMPLANT
BLADE SURG 15 STRL LF DISP TIS (BLADE) ×1 IMPLANT
BLADE SURG 15 STRL SS (BLADE) ×2
CANISTER SUCT 1200ML W/VALVE (MISCELLANEOUS) IMPLANT
CHLORAPREP W/TINT 26 (MISCELLANEOUS) ×2 IMPLANT
CLEANER CAUTERY TIP 5X5 PAD (MISCELLANEOUS) ×1 IMPLANT
COVER BACK TABLE 60X90IN (DRAPES) ×2 IMPLANT
COVER MAYO STAND STRL (DRAPES) ×2 IMPLANT
COVER PROBE 5X48 (MISCELLANEOUS) ×2
COVER WAND RF STERILE (DRAPES) IMPLANT
DECANTER SPIKE VIAL GLASS SM (MISCELLANEOUS) ×2 IMPLANT
DRAPE C-ARM 42X72 X-RAY (DRAPES) ×2 IMPLANT
DRAPE LAPAROTOMY TRNSV 102X78 (DRAPES) ×2 IMPLANT
DRAPE UTILITY XL STRL (DRAPES) ×2 IMPLANT
DRSG TEGADERM 4X4.75 (GAUZE/BANDAGES/DRESSINGS) ×2 IMPLANT
ELECT REM PT RETURN 9FT ADLT (ELECTROSURGICAL) ×2
ELECTRODE REM PT RTRN 9FT ADLT (ELECTROSURGICAL) ×1 IMPLANT
GAUZE SPONGE 4X4 12PLY STRL LF (GAUZE/BANDAGES/DRESSINGS) IMPLANT
GLOVE BIO SURGEON STRL SZ 6.5 (GLOVE) ×1 IMPLANT
GLOVE SURG ENC MOIS LTX SZ7 (GLOVE) ×2 IMPLANT
GLOVE SURG UNDER POLY LF SZ7.5 (GLOVE) ×2 IMPLANT
GOWN STRL REUS W/ TWL LRG LVL3 (GOWN DISPOSABLE) ×1 IMPLANT
GOWN STRL REUS W/TWL LRG LVL3 (GOWN DISPOSABLE) ×2
IV KIT MINILOC 20X1 SAFETY (NEEDLE) IMPLANT
KIT CVR 48X5XPRB PLUP LF (MISCELLANEOUS) IMPLANT
KIT PORT POWER 8FR ISP CVUE (Port) ×1 IMPLANT
NDL HYPO 25X1 1.5 SAFETY (NEEDLE) ×1 IMPLANT
NDL SAFETY ECLIPSE 18X1.5 (NEEDLE) IMPLANT
NDL SPNL 22GX3.5 QUINCKE BK (NEEDLE) IMPLANT
NEEDLE HYPO 18GX1.5 SHARP (NEEDLE)
NEEDLE HYPO 25X1 1.5 SAFETY (NEEDLE) ×2 IMPLANT
NEEDLE SPNL 22GX3.5 QUINCKE BK (NEEDLE) IMPLANT
PACK BASIN DAY SURGERY FS (CUSTOM PROCEDURE TRAY) ×2 IMPLANT
PAD CLEANER CAUTERY TIP 5X5 (MISCELLANEOUS) ×1
PENCIL SMOKE EVACUATOR (MISCELLANEOUS) ×2 IMPLANT
SLEEVE SCD COMPRESS KNEE MED (MISCELLANEOUS) ×2 IMPLANT
SPONGE GAUZE 2X2 8PLY STRL LF (GAUZE/BANDAGES/DRESSINGS) ×2 IMPLANT
STRIP CLOSURE SKIN 1/2X4 (GAUZE/BANDAGES/DRESSINGS) ×2 IMPLANT
SUT MON AB 4-0 PC3 18 (SUTURE) ×2 IMPLANT
SUT PROLENE 2 0 CT2 30 (SUTURE) ×2 IMPLANT
SUT VIC AB 3-0 SH 27 (SUTURE) ×2
SUT VIC AB 3-0 SH 27X BRD (SUTURE) ×1 IMPLANT
SYR 5ML LUER SLIP (SYRINGE) ×2 IMPLANT
SYR CONTROL 10ML LL (SYRINGE) ×2 IMPLANT
TOWEL GREEN STERILE FF (TOWEL DISPOSABLE) ×2 IMPLANT
TUBE CONNECTING 20X1/4 (TUBING) IMPLANT
YANKAUER SUCT BULB TIP NO VENT (SUCTIONS) IMPLANT

## 2020-02-22 NOTE — H&P (Signed)
History of Present Illness  The patient is a 60 year old female who presents with breast cancer. Breast MDC 01/12/20 [name]  This is a 60 year old female who is BRCA 2 positive after testing when her mother had breast and ovarian cancer. She presents after a normal mammogram in 2/21 with a palpable mass in her right breast. She underwent further work-up which revealed two tumors in the RUOQ at 10:30. The mass 2 cmfn measured 1.3 x 1.8 x 1.1 cm. The mass 7 cmfn measured 1.6 x 1.5 x 1.5 cm. 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 She has seen Dr. [name] in the past and has had the discussion regarding mastectomies and oophorectomies but has not had anything done yet. Her sister also is BRCA 2 positive.    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 Cesarean Section - Multiple Colon Polyp Removal - Colonoscopy Foot Surgery Bilateral. Hysterectomy (not due to cancer) - Partial Oral Surgery  Diagnostic Studies History  Colonoscopy 5-10 years ago Mammogram within last year Pap Smear 1-5 years ago  Allergies Procaine HCl *LOCAL ANESTHETICS-Parenteral* Swelling. Relafen *ANALGESICS - ANTI-INFLAMMATORY* Swelling.  Medication History  Medications Reconciled Flonase (50MCG/DOSE Inhaler, Nasal) Active. Avonex Pen (30MCG/0.5ML Auto-inj Kit, Intramuscular) Active. Multiple Vitamin (1 (one) Oral) Active. Zocor (20MG Tablet, Oral) Active. Topamax (25MG Tablet, Oral) Active. Ventolin HFA (108 (90 Base)MCG/ACT Aerosol Soln, Inhalation) Active. Vitamin D (1000UNIT Tablet, Oral) Active.  Social History Caffeine use Carbonated beverages, Tea. No alcohol use No drug use Tobacco use Never smoker.  Family History  Arthritis Mother. Breast Cancer Mother. Diabetes Mellitus Mother. Hypertension  Mother. Migraine Headache Sister. Ovarian Cancer Mother, Sister. Rectal Cancer Sister. Respiratory Condition Sister. Thyroid problems Sister.  Pregnancy / Birth History Age at menarche 87 years. Age of menopause 33-55 Gravida 3 Irregular periods Length (months) of breastfeeding 7-12 Maternal age 37-25 Para 2  Other Problems  Hypercholesterolemia Lump In Breast Migraine Headache     Review of Systems  General Not Present- Appetite Loss, Chills, Fatigue, Fever, Night Sweats, Weight Gain and Weight Loss. Skin Not Present- Change in Wart/Mole, Dryness, Hives, Jaundice, New Lesions, Non-Healing Wounds, Rash and Ulcer. HEENT Present- Seasonal Allergies and Wears glasses/contact lenses. Not Present- Earache, Hearing Loss, Hoarseness, Nose Bleed, Oral Ulcers, Ringing in the Ears, Sinus Pain, Sore Throat, Visual Disturbances and Yellow Eyes. Respiratory Not Present- Bloody sputum, Chronic Cough, Difficulty Breathing, Snoring and Wheezing. Breast Present- Breast Mass. Not Present- Breast Pain, Nipple Discharge and Skin Changes. Cardiovascular Not Present- Chest Pain, Difficulty Breathing Lying Down, Leg Cramps, Palpitations, Rapid Heart Rate, Shortness of Breath and Swelling of Extremities. Gastrointestinal Not Present- Abdominal Pain, Bloating, Bloody Stool, Change in Bowel Habits, Chronic diarrhea, Constipation, Difficulty Swallowing, Excessive gas, Gets full quickly at meals, Hemorrhoids, Indigestion, Nausea, Rectal Pain and Vomiting. Female Genitourinary Not Present- Frequency, Nocturia, Painful Urination, Pelvic Pain and Urgency. Musculoskeletal Present- Muscle Pain. Not Present- Back Pain, Joint Pain, Joint Stiffness, Muscle Weakness and Swelling of Extremities. Neurological Present- Numbness and Tingling. Not Present- Decreased Memory, Fainting, Headaches, Seizures, Tremor, Trouble walking and Weakness. Psychiatric Not Present- Anxiety, Bipolar, Change in Sleep  Pattern, Depression, Fearful and Frequent crying. Endocrine Present- Hot flashes. Not Present- Cold Intolerance, Excessive Hunger, Hair Changes, Heat Intolerance and New Diabetes. Hematology Not Present- Blood Thinners, Easy Bruising, Excessive bleeding, Gland problems, HIV and Persistent Infections.   Physical Exam  The  physical exam findings are as follows: Note:Constitutional: WDWN in NAD, conversant, no obvious deformities; resting comfortably Eyes: Pupils equal, round; sclera anicteric; moist conjunctiva; no lid lag HENT: Oral mucosa moist; good dentition Neck: No masses palpated, trachea midline; no thyromegaly Lungs: CTA bilaterally; normal respiratory effort CV: Regular rate and rhythm; no murmurs; extremities well-perfused with no edema Breasts: symmetric; no nipple changes; palpable mass in RUOQ; no axillary lymphadenopathy; no left masses or LAD Abd: +bowel sounds, soft, non-tender, no palpable organomegaly; no palpable hernias Musc: Normal gait; no apparent clubbing or cyanosis in extremities Lymphatic: No palpable cervical or axillary lymphadenopathy Skin: Warm, dry; no sign of jaundice Psychiatric - alert and oriented x 4; calm mood and affect    Assessment & Plan   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 The surgical procedure has been discussed with the patient. Potential risks, benefits, alternative treatments, and expected outcomes have been explained. All of the patient's questions at this time have been answered. The likelihood of reaching the patient's treatment goal is good. The patient understand the proposed surgical procedure and wishes to proceed. Note:I spent a considerable  amount of time with the patient and her sister, who is also BRCA 2 positive. We discussed breast conserving surgery x radiation after neoadjuvant chemotherapy, if the the tumors respond. We also discussed the recommendation for mastectomy, possibly bilateral mastectomies for BRCA2, along with oophorectomy by GYN. This was all discussed with her in the past, but she seems to not remember any discussion. We discussed the possibility of breast reconstruction by Plastic Surgery. If she wishes to learn more about the possibility of reconstruction, she can see Plastic Surgery while she is receiving neoadjuvant chemotherapy so we can schedule her surgery in timely fashion after chemotherapy.  Current plan:   MRI Biopsy of microcalcifications between the two cancers Port placement today.   Neoadjuvant chemotherapy Follow-up MRI Surgery - mastectomy +/- reconstruction vs. breast conserving therapy with XRT  Imogene Burn. Georgette Dover, MD, [facility] Surgery  General/ Trauma Surgery   02/22/2020 12:16 PM

## 2020-02-22 NOTE — Op Note (Signed)
Preop diagnosis: Right breast invasive ductal carcinoma Postop diagnosis: Same Procedure performed: Right subclavian vein port placement Surgeon:Brittni Hult K Georgeann Brinkman Anesthesia: General via LMA Indications: This is a 60 year old female who is BRCA 2 positive after testing when her mother had breast and ovarian cancer. She presents after a normal mammogram in 2/21 with a palpable mass in her right breast. She underwent further work-up which revealed two tumors in the RUOQ at 10:30. The mass 2 cmfn measured 1.3 x 1.8 x 1.1 cm. The mass 7 cmfn measured 1.6 x 1.5 x 1.5 cm. 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  MRI revealed two other masses that were biopsied.  One revealed an additional area of IDC and the other showed DCIS.  She presents for port placement to begin neoadjuvant chemotherapy.   Description of procedure: The patient is brought to the operating room placed in the supine position on the operating table.  After an adequate level of general anesthesia was obtained, the patient right arm was tucked at her side.  Her right chest and neck were prepped with ChloraPrep and draped sterile fashion.  A timeout was taken to ensure the proper patient and proper procedure.  She was placed in Trendelenburg position.  We interrogated her neck with the ultrasound.  The patient has a very short neck and I did not feel that there was a good window of tissue to access the jugular vein.  I then moved to the right subclavian vein approach.  I cannulated the right subclavian vein with the 18 gauge needle with easy blood return.  The wire passed easily.  Fluoroscopy confirmed that the wire headed down the right side of the mediastinum.  The needle was removed.  We created a subcutaneous pocket below the right clavicle around the insertion site..  We first anesthetized with local anesthetic.  We created a subcutaneous tunnel from the subcutaneous pocket to the insertion site on the neck.  An 8 French [place] port was assembled  and inserted into the subcutaneous pocket.  The catheter was cut to the appropriate length using fluoroscopic guidance.  Using fluoroscopic guidance, we passed the dilator and breakaway sheath over the wire.  The wire and dilator were removed.  The catheter was then advanced through the sheath which was removed.  Fluoroscopy confirmed that there were no kinks along the length of the catheter.  We are able to aspirate blood easily through the port and were able to flush easily.  The port was secured with two interrupted 2-0 Prolene sutures.  3-0 Vicryl was used to close the subcutaneous tissue and 4-0 Monocryl was used to close the skin at both sites.  Benzoin and Steri-Strips were applied.  The port was left accessed for chemotherapy tomorrow.   An occlusive dressing was placed.  The patient was then extubated and brought to the recovery room in stable condition.  All sponge, instrument, and needle counts are correct.  Post-op chest x-ray is pending.  Imogene Burn. Georgette Dover, MD, [facility] Surgery  General/ Trauma Surgery   02/22/2020 1:34 PM

## 2020-02-22 NOTE — Discharge Instructions (Signed)

## 2020-02-22 NOTE — Anesthesia Preprocedure Evaluation (Signed)
Anesthesia Evaluation  Patient identified by MRN, date of birth, ID band Patient awake    Reviewed: NPO status , Patient's Chart, lab work & pertinent test results  Airway Mallampati: II  TM Distance: >3 FB Neck ROM: Full    Dental  (+) Teeth Intact   Pulmonary asthma ,    Pulmonary exam normal        Cardiovascular negative cardio ROS   Rhythm:Regular Rate:Normal     Neuro/Psych  Headaches,  Neuromuscular disease (MS dx 2009) negative psych ROS   GI/Hepatic negative GI ROS, Neg liver ROS,   Endo/Other  negative endocrine ROS  Renal/GU negative Renal ROS  negative genitourinary   Musculoskeletal  (+) Arthritis , Osteoarthritis,  Right IDC   Abdominal (+)  Abdomen: soft. Bowel sounds: normal.  Peds  Hematology  (+) anemia ,   Anesthesia Other Findings   Reproductive/Obstetrics                             Anesthesia Physical Anesthesia Plan  ASA: III  Anesthesia Plan: General   Post-op Pain Management:    Induction: Intravenous  PONV Risk Score and Plan: 2 and Ondansetron and Propofol infusion  Airway Management Planned: Mask and LMA  Additional Equipment: None  Intra-op Plan:   Post-operative Plan: Extubation in OR  Informed Consent: I have reviewed the patients History and Physical, chart, labs and discussed the procedure including the risks, benefits and alternatives for the proposed anesthesia with the patient or authorized representative who has indicated his/her understanding and acceptance.     Dental advisory given  Plan Discussed with: CRNA  Anesthesia Plan Comments: (ECHO 12/21: Left ventricular ejection fraction, by estimation, is 55 to 60%. The left ventricle has normal function. The left ventricle has no regional wall motion abnormalities. Left ventricular diastolic parameters are consistent with Grade I diastolic dysfunction (impaired relaxation). The  average left ventricular global longitudinal strain is -18.1 %. The global longitudinal strain is normal. 2. Right ventricular systolic function is normal. The right ventricular size is normal. 3. The mitral valve is normal in structure. No evidence of mitral valve regurgitation. No evidence of mitral stenosis. 4. The aortic valve is normal in structure. Aortic valve regurgitation is not visualized. No aortic stenosis is present. 5. The inferior vena cava is normal in size with greater than 50% respiratory variability, suggesting right atrial pressure of 3 mmHg.)        Anesthesia Quick Evaluation

## 2020-02-22 NOTE — Anesthesia Procedure Notes (Signed)
Procedure Name: LMA Insertion Date/Time: 02/22/2020 12:40 PM Performed by: Maryella Shivers, CRNA Pre-anesthesia Checklist: Patient identified, Emergency Drugs available, Suction available and Patient being monitored Patient Re-evaluated:Patient Re-evaluated prior to induction Oxygen Delivery Method: Circle system utilized Preoxygenation: Pre-oxygenation with 100% oxygen Induction Type: IV induction Ventilation: Mask ventilation without difficulty LMA: LMA inserted LMA Size: 4.0 Number of attempts: 1 Airway Equipment and Method: Bite block Placement Confirmation: positive ETCO2 Tube secured with: Tape Dental Injury: Teeth and Oropharynx as per pre-operative assessment

## 2020-02-22 NOTE — Transfer of Care (Signed)
Immediate Anesthesia Transfer of Care Note  Patient: Jacqueline Adams  Procedure(s) Performed: RIGHT SUBCLAVIAN VEIN PORT PLACEMENT (Right Chest)  Patient Location: PACU  Anesthesia Type:General  Level of Consciousness: awake, alert  and oriented  Airway & Oxygen Therapy: Patient Spontanous Breathing and Patient connected to face mask oxygen  Post-op Assessment: Report given to RN and Post -op Vital signs reviewed and stable  Post vital signs: Reviewed and stable  Last Vitals:  Vitals Value Taken Time  BP 109/82 02/22/20 1330  Temp    Pulse 94 02/22/20 1331  Resp 22 02/22/20 1331  SpO2 100 % 02/22/20 1331  Vitals shown include unvalidated device data.  Last Pain:  Vitals:   02/22/20 1119  TempSrc: Oral  PainSc: 0-No pain         Complications: No complications documented.

## 2020-02-22 NOTE — Anesthesia Postprocedure Evaluation (Signed)
Anesthesia Post Note  Patient: Jacqueline Adams  Procedure(s) Performed: RIGHT SUBCLAVIAN VEIN PORT PLACEMENT (Right Chest)     Patient location during evaluation: PACU Anesthesia Type: General Level of consciousness: awake and alert Pain management: pain level controlled Vital Signs Assessment: post-procedure vital signs reviewed and stable Respiratory status: spontaneous breathing, nonlabored ventilation, respiratory function stable and patient connected to nasal cannula oxygen Cardiovascular status: blood pressure returned to baseline and stable Postop Assessment: no apparent nausea or vomiting Anesthetic complications: no   No complications documented.  Last Vitals:  Vitals:   02/22/20 1400 02/22/20 1427  BP: 125/77 124/65  Pulse: 80 80  Resp: 13 15  Temp:  36.9 C  SpO2: 96% 100%    Last Pain:  Vitals:   02/22/20 1412  TempSrc:   PainSc: 0-No pain                 Belenda Cruise P Salvatore Shear

## 2020-02-22 NOTE — Progress Notes (Signed)
° °Patient Care Team: °Aguiar, Rafaela M, MD as PCP - General (Family Medicine) °Love, James M, MD (Neurology) °Shoemaker, David, MD °Stuart, Dawn C, RN as Oncology Nurse Navigator °Martini, Keisha N, RN as Oncology Nurse Navigator °Tsuei, Matthew, MD as Consulting Physician (General Surgery) °Gudena, Vinay, MD as Consulting Physician (Hematology and Oncology) °Moody, John, MD as Consulting Physician (Radiation Oncology) ° °DIAGNOSIS:  °  ICD-10-CM   °1. Malignant neoplasm of upper-outer quadrant of right breast in female, estrogen receptor negative (HCC)  C50.411   ° Z17.1   ° ° °SUMMARY OF ONCOLOGIC HISTORY: °Oncology History  °Malignant neoplasm of upper-outer quadrant of right breast in female, estrogen receptor negative (HCC)  °01/10/2020 Initial Diagnosis  ° Palpable right breast mass. Mammogram showed a 1.8cm mass at the 10:30 position 2cm from the nipple, a 1.5cm mass at the 10:30 position 7cm from the nipple, and indeterminate calcifications between the two masses in the right breast. 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  °  °01/12/2020 Cancer Staging  ° Staging form: Breast, AJCC 8th Edition °- Clinical stage from 01/12/2020: Stage IB (cT1c, cN0, cM0, G3, ER-, PR-, HER2-) - Signed by Gudena, Vinay, MD on 02/03/2020 °  °02/23/2020 -  Chemotherapy  °  Patient is on Treatment Plan: BREAST DOSE DENSE AC Q14D / CARBOPLATIN D1 + PACLITAXEL D1,8,15 Q21D ° °  °  ° ° °CHIEF COMPLIANT: Cycle 1 Adriamycin, Cytoxan  ° °INTERVAL HISTORY: Jacqueline Adams is a 60 y.o. with above-mentioned history of BRCA2 mutation and right breast cancer currently on neoadjuvant chemotherapy with Adriamycin and Cytoxan . Her port was placed by Dr. Tsuei on 02/22/19. She presents to the clinic today for treatment.  °Based on her discussions with neurology, we decided to discontinue pembrolizumab. ° °ALLERGIES:  is allergic to procaine and relafen  [nabumetone]. ° °MEDICATIONS:  °Current Outpatient Medications  °Medication Sig Dispense Refill  °• dexamethasone (DECADRON) 4 MG tablet Take 1 tablet day before chemo and 1 tablet day after chemo with food 12 tablet 0  °• fexofenadine (ALLEGRA) 180 MG tablet Take 180 mg by mouth daily.    °• fluticasone (FLONASE) 50 MCG/ACT nasal spray Place 2 sprays into both nostrils daily. 16 g 2  °• HYDROcodone-acetaminophen (NORCO/VICODIN) 5-325 MG tablet Take 1 tablet by mouth every 6 (six) hours as needed for moderate pain. 15 tablet 0  °• ibuprofen (ADVIL) 400 MG tablet Take 1 tablet (400 mg total) by mouth every 8 (eight) hours as needed. 90 tablet 5  °• interferon beta-1a (AVONEX PREFILLED) 30 MCG/0.5ML PSKT injection INJECT ONE SYRINGE INTRAMUSCULARLY ONCE WEEKLY. REFRIGERATE. PROTECT FROM LIGHT. ALLOW TO WARM TO ROOM TEMPERATURE PRIOR TO USE. 3 each 3  °• lidocaine-prilocaine (EMLA) cream Apply to affected area once 30 g 3  °• Multiple Vitamins-Minerals (CENTRUM SILVER ADULT 50+ PO) Take by mouth.    °• ondansetron (ZOFRAN) 8 MG tablet Take 1 tablet (8 mg total) by mouth 2 (two) times daily as needed. Start on the third day after chemotherapy. 30 tablet 1  °• prochlorperazine (COMPAZINE) 10 MG tablet Take 1 tablet (10 mg total) by mouth every 6 (six) hours as needed (Nausea or vomiting). 30 tablet 1  °• simvastatin (ZOCOR) 20 MG tablet Take 1 tablet by mouth daily.    °• topiramate (TOPAMAX) 25 MG tablet Take 25 mg by mouth.    °• VENTOLIN HFA 108 (90 BASE) MCG/ACT inhaler Inhale 2   2 puffs into the lungs every 6 (six) hours as needed.     VITAMIN D PO Take 2 tablets by mouth daily. +calcium     No current facility-administered medications for this visit.    PHYSICAL EXAMINATION: ECOG PERFORMANCE STATUS: 1 - Symptomatic but completely ambulatory  Vitals:   02/23/20 0818  BP: 138/68  Pulse: 96  Resp: 18  Temp: (!) 97.4 F (36.3 C)  SpO2: 95%   Filed Weights   02/23/20 0818  Weight: 220 lb (99.8 kg)     LABORATORY DATA:  I have reviewed the data as listed CMP Latest Ref Rng & Units 01/12/2020 09/22/2019 03/25/2019  Glucose 70 - 99 mg/dL 113(H) 118(H) 103(H)  BUN 6 - 20 mg/dL _0 Creatinine 0.44 - 1.00 mg/dL 0.73 0.69 0.68  Sodium 135 - 145 mmol/L 143 144 143  Potassium 3.5 - 5.1 mmol/L 3.5 3.6 3.5  Chloride 98 - 111 mmol/L 110 107(H) 104  CO2 22 - 32 mmol/L _1 Calcium 8.9 - 10.3 mg/dL 9.4 9.2 9.2  Total Protein 6.5 - 8.1 g/dL 7.7 7.0 6.8  Total Bilirubin 0.3 - 1.2 mg/dL 0.3 0.2 0.4  Alkaline Phos 38 - 126 U/L 96 106 98  AST 15 - 41 U/L _2 ALT 0 - 44 U/L _3 Lab Results  Component Value Date   WBC 22.7 (H) 02/23/2020   HGB 12.2 02/23/2020   HCT 38.8 02/23/2020   MCV 76.7 (L) 02/23/2020   PLT 346 02/23/2020   NEUTROABS 19.3 (H) 02/23/2020    ASSESSMENT & PLAN:  Malignant neoplasm of upper-outer quadrant of right breast in female, estrogen receptor negative ([place]) 01/10/2020:Palpable right breast mass. Mammogram showed a 1.8cm mass at the 10:30 position 2cm from the nipple, a 1.5cm mass at the 10:30 position 7cm from the nipple, and indeterminate calcifications between the two masses in the right breast. 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 BRCA2Mutation  Treatment plan: 1. Neoadjuvant chemotherapy with Adriamycin and Cytoxan every 2 weeks4 followed by Taxol weekly 12 with carboplatinand pembrolizumabevery 3 weeks x4  2. Followed by breast conserving surgery with sentinel lymph node study vsbilateral mastectomies 3. Followed by adjuvant radiation therapyif she undergoes breast conserving surgery 4.Adjuvant olaparib URCC nausea study  Breast MRI 01/14/2020: 2 cm spiculated mass UOQ right breast, 2.1 cm spiculated mass UOQ right breast, biopsy-proven malignancies.  7 mm suspicious enhancing mass: Biopsy 02/02/2020: High-grade  DCIS ----------------------------------------------------------------------------------------------------------------------------------------------- Current treatment: Cycle 1 neoadjuvant Adriamycin and Cytoxan every 2 weeks 02/23/2020 Echocardiogram 01/14/2020: EF 55 to 60%  Based on my discussions with her neurologist regarding multiple sclerosis and immunotherapy, we decided that it would not be a safe treatment for her and therefore I removed pembrolizumab from her treatment plan.  We will also change her treatment to be done every 2 weeks and a dose dense fashion instead of every 3 weeks.  She is getting the G-CSF injection at home. Return to clinic in 1 week for toxicity check.  No orders of the defined types were placed in this encounter.  The patient has a good understanding of the overall plan. she agrees with it. she will call with any problems that may develop before the next visit here.  Total time spent: 30 mins including face to face time and time spent for planning, charting and coordination of care  Nicholas Lose, MD  02/23/2020  I, Molly Dorshimer, am acting as scribe for Dr. Nicholas Lose.  I have reviewed the above documentation for accuracy and completeness, and I agree with the above.

## 2020-02-23 ENCOUNTER — Inpatient Hospital Stay: Payer: 59 | Attending: Hematology and Oncology

## 2020-02-23 ENCOUNTER — Inpatient Hospital Stay: Payer: 59

## 2020-02-23 ENCOUNTER — Encounter (HOSPITAL_BASED_OUTPATIENT_CLINIC_OR_DEPARTMENT_OTHER): Payer: Self-pay | Admitting: Surgery

## 2020-02-23 ENCOUNTER — Encounter: Payer: Self-pay | Admitting: *Deleted

## 2020-02-23 ENCOUNTER — Encounter: Payer: Self-pay | Admitting: Hematology and Oncology

## 2020-02-23 ENCOUNTER — Inpatient Hospital Stay (HOSPITAL_BASED_OUTPATIENT_CLINIC_OR_DEPARTMENT_OTHER): Payer: 59 | Admitting: Hematology and Oncology

## 2020-02-23 ENCOUNTER — Other Ambulatory Visit: Payer: Self-pay

## 2020-02-23 DIAGNOSIS — Z171 Estrogen receptor negative status [ER-]: Secondary | ICD-10-CM | POA: Diagnosis not present

## 2020-02-23 DIAGNOSIS — C50411 Malignant neoplasm of upper-outer quadrant of right female breast: Secondary | ICD-10-CM | POA: Insufficient documentation

## 2020-02-23 DIAGNOSIS — Z79899 Other long term (current) drug therapy: Secondary | ICD-10-CM | POA: Insufficient documentation

## 2020-02-23 DIAGNOSIS — Z5111 Encounter for antineoplastic chemotherapy: Secondary | ICD-10-CM | POA: Insufficient documentation

## 2020-02-23 DIAGNOSIS — Z95828 Presence of other vascular implants and grafts: Secondary | ICD-10-CM

## 2020-02-23 LAB — CBC WITH DIFFERENTIAL (CANCER CENTER ONLY)
Abs Immature Granulocytes: 0.15 10*3/uL — ABNORMAL HIGH (ref 0.00–0.07)
Basophils Absolute: 0 10*3/uL (ref 0.0–0.1)
Basophils Relative: 0 %
Eosinophils Absolute: 0 10*3/uL (ref 0.0–0.5)
Eosinophils Relative: 0 %
HCT: 38.8 % (ref 36.0–46.0)
Hemoglobin: 12.2 g/dL (ref 12.0–15.0)
Immature Granulocytes: 1 %
Lymphocytes Relative: 12 %
Lymphs Abs: 2.7 10*3/uL (ref 0.7–4.0)
MCH: 24.1 pg — ABNORMAL LOW (ref 26.0–34.0)
MCHC: 31.4 g/dL (ref 30.0–36.0)
MCV: 76.7 fL — ABNORMAL LOW (ref 80.0–100.0)
Monocytes Absolute: 0.5 10*3/uL (ref 0.1–1.0)
Monocytes Relative: 2 %
Neutro Abs: 19.3 10*3/uL — ABNORMAL HIGH (ref 1.7–7.7)
Neutrophils Relative %: 85 %
Platelet Count: 346 10*3/uL (ref 150–400)
RBC: 5.06 MIL/uL (ref 3.87–5.11)
RDW: 15.9 % — ABNORMAL HIGH (ref 11.5–15.5)
WBC Count: 22.7 10*3/uL — ABNORMAL HIGH (ref 4.0–10.5)
nRBC: 0 % (ref 0.0–0.2)

## 2020-02-23 LAB — CMP (CANCER CENTER ONLY)
ALT: 17 U/L (ref 0–44)
AST: 17 U/L (ref 15–41)
Albumin: 3.5 g/dL (ref 3.5–5.0)
Alkaline Phosphatase: 87 U/L (ref 38–126)
Anion gap: 10 (ref 5–15)
BUN: 10 mg/dL (ref 6–20)
CO2: 24 mmol/L (ref 22–32)
Calcium: 9.3 mg/dL (ref 8.9–10.3)
Chloride: 108 mmol/L (ref 98–111)
Creatinine: 0.71 mg/dL (ref 0.44–1.00)
GFR, Estimated: >60 mL/min (ref >60)
Glucose, Bld: 138 mg/dL — ABNORMAL HIGH (ref 70–99)
Potassium: 3.5 mmol/L (ref 3.5–5.1)
Sodium: 142 mmol/L (ref 135–145)
Total Bilirubin: 0.4 mg/dL (ref 0.3–1.2)
Total Protein: 7.8 g/dL (ref 6.5–8.1)

## 2020-02-23 LAB — TSH: TSH: 0.387 u[IU]/mL (ref 0.308–3.960)

## 2020-02-23 MED ORDER — SODIUM CHLORIDE 0.9 % IV SOLN
600.0000 mg/m2 | Freq: Once | INTRAVENOUS | Status: AC
  Administered 2020-02-23: 1260 mg via INTRAVENOUS
  Filled 2020-02-23: qty 63

## 2020-02-23 MED ORDER — PALONOSETRON HCL INJECTION 0.25 MG/5ML
INTRAVENOUS | Status: AC
  Filled 2020-02-23: qty 5

## 2020-02-23 MED ORDER — DEXAMETHASONE SODIUM PHOSPHATE 100 MG/10ML IJ SOLN
10.0000 mg | Freq: Once | INTRAMUSCULAR | Status: AC
  Administered 2020-02-23: 10 mg via INTRAVENOUS
  Filled 2020-02-23: qty 10

## 2020-02-23 MED ORDER — SODIUM CHLORIDE 0.9 % IV SOLN
Freq: Once | INTRAVENOUS | Status: AC
  Filled 2020-02-23: qty 250

## 2020-02-23 MED ORDER — SODIUM CHLORIDE 0.9% FLUSH
10.0000 mL | INTRAVENOUS | Status: DC | PRN
  Administered 2020-02-23: 10 mL via INTRAVENOUS
  Filled 2020-02-23: qty 10

## 2020-02-23 MED ORDER — PALONOSETRON HCL INJECTION 0.25 MG/5ML
0.2500 mg | Freq: Once | INTRAVENOUS | Status: AC
  Administered 2020-02-23: 0.25 mg via INTRAVENOUS

## 2020-02-23 MED ORDER — SODIUM CHLORIDE 0.9 % IV SOLN
150.0000 mg | Freq: Once | INTRAVENOUS | Status: AC
  Administered 2020-02-23: 150 mg via INTRAVENOUS
  Filled 2020-02-23: qty 150

## 2020-02-23 MED ORDER — HEPARIN SOD (PORK) LOCK FLUSH 100 UNIT/ML IV SOLN
500.0000 [IU] | Freq: Once | INTRAVENOUS | Status: AC | PRN
  Administered 2020-02-23: 500 [IU]
  Filled 2020-02-23: qty 5

## 2020-02-23 MED ORDER — SODIUM CHLORIDE 0.9% FLUSH
10.0000 mL | INTRAVENOUS | Status: DC | PRN
  Administered 2020-02-23: 10 mL
  Filled 2020-02-23: qty 10

## 2020-02-23 MED ORDER — DOXORUBICIN HCL CHEMO IV INJECTION 2 MG/ML
60.0000 mg/m2 | Freq: Once | INTRAVENOUS | Status: AC
  Administered 2020-02-23: 126 mg via INTRAVENOUS
  Filled 2020-02-23: qty 63

## 2020-02-23 NOTE — Patient Instructions (Signed)

## 2020-02-23 NOTE — Patient Instructions (Signed)
[facility] Discharge Instructions for Patients Receiving Chemotherapy  Today you received the following chemotherapy agents: Adriamycin and Cytoxan  To help prevent nausea and vomiting after your treatment, we encourage you to take your nausea medication as prescribed.   If you develop nausea and vomiting that is not controlled by your nausea medication, call the clinic.   BELOW ARE SYMPTOMS THAT SHOULD BE REPORTED IMMEDIATELY:  *FEVER GREATER THAN 100.5 F  *CHILLS WITH OR WITHOUT FEVER  NAUSEA AND VOMITING THAT IS NOT CONTROLLED WITH YOUR NAUSEA MEDICATION  *UNUSUAL SHORTNESS OF BREATH  *UNUSUAL BRUISING OR BLEEDING  TENDERNESS IN MOUTH AND THROAT WITH OR WITHOUT PRESENCE OF ULCERS  *URINARY PROBLEMS  *BOWEL PROBLEMS  UNUSUAL RASH Items with * indicate a potential emergency and should be followed up as soon as possible.  Feel free to call the clinic should you have any questions or concerns. The clinic phone number is [phone].  Please show the [facility] at check-in to the Emergency Department and triage nurse.    Doxorubicin injection What is this medicine? DOXORUBICIN (dox oh ROO bi sin) is a chemotherapy drug. It is used to treat many kinds of cancer like leukemia, lymphoma, neuroblastoma, sarcoma, and Wilms' tumor. It is also used to treat bladder cancer, breast cancer, lung cancer, ovarian cancer, stomach cancer, and thyroid cancer. This medicine may be used for other purposes; ask your health care provider or pharmacist if you have questions. COMMON BRAND NAME(S): Adriamycin, Adriamycin PFS, Adriamycin RDF, Rubex What should I tell my health care provider before I take this medicine? They need to know if you have any of these conditions:  heart disease  history of low blood counts caused by a medicine  liver disease  recent or ongoing radiation therapy  an unusual or allergic reaction to doxorubicin, other chemotherapy agents,  other medicines, foods, dyes, or preservatives  pregnant or trying to get pregnant  breast-feeding How should I use this medicine? This drug is given as an infusion into a vein. It is administered in a hospital or clinic by a specially trained health care professional. If you have pain, swelling, burning or any unusual feeling around the site of your injection, tell your health care professional right away. Talk to your pediatrician regarding the use of this medicine in children. Special care may be needed. Overdosage: If you think you have taken too much of this medicine contact a poison control center or emergency room at once. NOTE: This medicine is only for you. Do not share this medicine with others. What if I miss a dose? It is important not to miss your dose. Call your doctor or health care professional if you are unable to keep an appointment. What may interact with this medicine? This medicine may interact with the following medications:  6-mercaptopurine  paclitaxel  phenytoin  St. John's Wort  trastuzumab  verapamil This list may not describe all possible interactions. Give your health care provider a list of all the medicines, herbs, non-prescription drugs, or dietary supplements you use. Also tell them if you smoke, drink alcohol, or use illegal drugs. Some items may interact with your medicine. What should I watch for while using this medicine? This drug may make you feel generally unwell. This is not uncommon, as chemotherapy can affect healthy cells as well as cancer cells. Report any side effects. Continue your course of treatment even though you feel ill unless your doctor tells you to stop. There is a  this medicine you should receive throughout your life. The amount depends on the medical condition being treated and your overall health. Your doctor will watch how much of this medicine you receive in your lifetime. Tell your doctor if you have taken this  medicine before. You may need blood work done while you are taking this medicine. Your urine may turn red for a few days after your dose. This is not blood. If your urine is dark or brown, call your doctor. In some cases, you may be given additional medicines to help with side effects. Follow all directions for their use. Call your doctor or health care professional for advice if you get a fever, chills or sore throat, or other symptoms of a cold or flu. Do not treat yourself. This drug decreases your body's ability to fight infections. Try to avoid being around people who are sick. This medicine may increase your risk to bruise or bleed. Call your doctor or health care professional if you notice any unusual bleeding. Talk to your doctor about your risk of cancer. You may be more at risk for certain types of cancers if you take this medicine. Do not become pregnant while taking this medicine or for 6 months after stopping it. Women should inform their doctor if they wish to become pregnant or think they might be pregnant. Men should not father a child while taking this medicine and for 6 months after stopping it. There is a potential for serious side effects to an unborn child. Talk to your health care professional or pharmacist for more information. Do not breast-feed an infant while taking this medicine. This medicine has caused ovarian failure in some women and reduced sperm counts in some men This medicine may interfere with the ability to have a child. Talk with your doctor or health care professional if you are concerned about your fertility. This medicine may cause a decrease in Co-Enzyme Q-10. You should make sure that you get enough Co-Enzyme Q-10 while you are taking this medicine. Discuss the foods you eat and the vitamins you take with your health care professional. What side effects may I notice from receiving this medicine? Side effects that you should report to your doctor or health care  professional as soon as possible:  allergic reactions like skin rash, itching or hives, swelling of the face, lips, or tongue  breathing problems  chest pain  fast or irregular heartbeat  low blood counts - this medicine may decrease the number of white blood cells, red blood cells and platelets. You may be at increased risk for infections and bleeding.  pain, redness, or irritation at site where injected  signs of infection - fever or chills, cough, sore throat, pain or difficulty passing urine  signs of decreased platelets or bleeding - bruising, pinpoint red spots on the skin, black, tarry stools, blood in the urine  swelling of the ankles, feet, hands  tiredness  weakness Side effects that usually do not require medical attention (report to your doctor or health care professional if they continue or are bothersome):  diarrhea  hair loss  mouth sores  nail discoloration or damage  nausea  red colored urine  vomiting This list may not describe all possible side effects. Call your doctor for medical advice about side effects. You may report side effects to FDA at 1-800-FDA-1088. Where should I keep my medicine? This drug is given in a hospital or clinic and will not be stored at home. NOTE:   at home. NOTE: This sheet is a summary. It may not cover all possible information. If you have questions about this medicine, talk to your doctor, pharmacist, or health care provider.  2021 Elsevier/Gold Standard (2016-09-11 11:01:26)     Cyclophosphamide Injection What is this medicine? CYCLOPHOSPHAMIDE (sye kloe FOSS fa mide) is a chemotherapy drug. It slows the growth of cancer cells. This medicine is used to treat many types of cancer like lymphoma, myeloma, leukemia, breast cancer, and ovarian cancer, to name a few. This medicine may be used for other purposes; ask your health care provider or pharmacist if you have questions. COMMON BRAND NAME(S): Cytoxan, Neosar What should I  tell my health care provider before I take this medicine? They need to know if you have any of these conditions:  heart disease  history of irregular heartbeat  infection  kidney disease  liver disease  low blood counts, like white cells, platelets, or red blood cells  on hemodialysis  recent or ongoing radiation therapy  scarring or thickening of the lungs  trouble passing urine  an unusual or allergic reaction to cyclophosphamide, other medicines, foods, dyes, or preservatives  pregnant or trying to get pregnant  breast-feeding How should I use this medicine? This drug is usually given as an injection into a vein or muscle or by infusion into a vein. It is administered in a hospital or clinic by a specially trained health care professional. Talk to your pediatrician regarding the use of this medicine in children. Special care may be needed. Overdosage: If you think you have taken too much of this medicine contact a poison control center or emergency room at once. NOTE: This medicine is only for you. Do not share this medicine with others. What if I miss a dose? It is important not to miss your dose. Call your doctor or health care professional if you are unable to keep an appointment. What may interact with this medicine?  amphotericin B  azathioprine  certain antivirals for HIV or hepatitis  certain medicines for blood pressure, heart disease, irregular heart beat  certain medicines that treat or prevent blood clots like warfarin  certain other medicines for cancer  cyclosporine  etanercept  indomethacin  medicines that relax muscles for surgery  medicines to increase blood counts  metronidazole This list may not describe all possible interactions. Give your health care provider a list of all the medicines, herbs, non-prescription drugs, or dietary supplements you use. Also tell them if you smoke, drink alcohol, or use illegal drugs. Some items may  interact with your medicine. What should I watch for while using this medicine? Your condition will be monitored carefully while you are receiving this medicine. You may need blood work done while you are taking this medicine. Drink water or other fluids as directed. Urinate often, even at night. Some products may contain alcohol. Ask your health care professional if this medicine contains alcohol. Be sure to tell all health care professionals you are taking this medicine. Certain medicines, like metronidazole and disulfiram, can cause an unpleasant reaction when taken with alcohol. The reaction includes flushing, headache, nausea, vomiting, sweating, and increased thirst. The reaction can last from 30 minutes to several hours. Do not become pregnant while taking this medicine or for 1 year after stopping it. Women should inform their health care professional if they wish to become pregnant or think they might be pregnant. Men should not father a child while taking this medicine and for 4 months  after stopping it. There is potential for serious side effects to an unborn child. Talk to your health care professional for more information. Do not breast-feed an infant while taking this medicine or for 1 week after stopping it. This medicine has caused ovarian failure in some women. This medicine may make it more difficult to get pregnant. Talk to your health care professional if you are concerned about your fertility. This medicine has caused decreased sperm counts in some men. This may make it more difficult to father a child. Talk to your health care professional if you are concerned about your fertility. Call your health care professional for advice if you get a fever, chills, or sore throat, or other symptoms of a cold or flu. Do not treat yourself. This medicine decreases your body's ability to fight infections. Try to avoid being around people who are sick. Avoid taking medicines that contain aspirin,  acetaminophen, ibuprofen, naproxen, or ketoprofen unless instructed by your health care professional. These medicines may hide a fever. Talk to your health care professional about your risk of cancer. You may be more at risk for certain types of cancer if you take this medicine. If you are going to need surgery or other procedure, tell your health care professional that you are using this medicine. Be careful brushing or flossing your teeth or using a toothpick because you may get an infection or bleed more easily. If you have any dental work done, tell your dentist you are receiving this medicine. What side effects may I notice from receiving this medicine? Side effects that you should report to your doctor or health care professional as soon as possible:  allergic reactions like skin rash, itching or hives, swelling of the face, lips, or tongue  breathing problems  nausea, vomiting  signs and symptoms of bleeding such as bloody or black, tarry stools; red or dark brown urine; spitting up blood or brown material that looks like coffee grounds; red spots on the skin; unusual bruising or bleeding from the eyes, gums, or nose  signs and symptoms of heart failure like fast, irregular heartbeat, sudden weight gain; swelling of the ankles, feet, hands  signs and symptoms of infection like fever; chills; cough; sore throat; pain or trouble passing urine  signs and symptoms of kidney injury like trouble passing urine or change in the amount of urine  signs and symptoms of liver injury like dark yellow or brown urine; general ill feeling or flu-like symptoms; light-colored stools; loss of appetite; nausea; right upper belly pain; unusually weak or tired; yellowing of the eyes or skin Side effects that usually do not require medical attention (report to your doctor or health care professional if they continue or are bothersome):  confusion  decreased hearing  diarrhea  facial flushing  hair  loss  headache  loss of appetite  missed menstrual periods  signs and symptoms of low red blood cells or anemia such as unusually weak or tired; feeling faint or lightheaded; falls  skin discoloration This list may not describe all possible side effects. Call your doctor for medical advice about side effects. You may report side effects to FDA at 1-800-FDA-1088. Where should I keep my medicine? This drug is given in a hospital or clinic and will not be stored at home. NOTE: This sheet is a summary. It may not cover all possible information. If you have questions about this medicine, talk to your doctor, pharmacist, or health care provider.  2021 Elsevier/Gold Standard (  2018-11-02 09:53:29)  

## 2020-02-23 NOTE — Research (Signed)
DCP-001, Use of a Clinical Trial Screening Tool to Address Cancer Health Disparities in the [facility]).    Patient Jacqueline Adams was identified by the research nurse as a potential candidate for the above listed study since she declined participation in the [id number] study on 02/02/20. This Clinical Research Nurse met with PEYTAN ANDRINGA, [id number] on 02/23/20 in a manner and location that ensures patient privacy to discuss participation in the above listed research study.  Patient is Unaccompanied.  Patient was previously provided with informed consent documents.  She confirmed that she read the consent form prior to today's visit.   As outlined in the informed consent form, this Nurse and [name] discussed the purpose of the research study, the investigational nature of the study, study procedures and requirements for study participation, potential risks and benefits of study participation, as well as alternatives to participation.  The patient understands participation is voluntary, and she may withdraw from study participation at any time.  This study does not involve randomization.  This study does not involve an investigational drug or device. This study does not involve a placebo. The pt meets all of the eligibility criteria to enter this research study.   Confidentiality and how the patient's information will be used as part of study participation were discussed.  Patient was informed there is not reimbursement provided for their time and effort spent on trial participation.  The pt understands that her demographic information will be shared with the study.  The pt was informed that this is a one-time data collection study.     All questions were answered to patient's satisfaction.  The informed consent and separate HIPAA Authorization was reviewed page by page.  The patient's mental and emotional status is appropriate to provide informed  consent, and the patient verbalizes an understanding of study participation.  Patient has agreed to participate in the above listed research study and has voluntarily signed the informed consent version and separate HIPAA Authorization, version 08/04/18 on 02/23/20 at 12:01 PM.  The patient was provided with a copy of the signed informed consent form and separate HIPAA Authorization for her reference.  No study specific procedures were obtained prior to the signing of the informed consent document.  Approximately 30 minutes were spent with the patient reviewing the informed consent documents.  Patient was not requested to complete a Release of Information form.  The pt completed her DCP worksheet after signing the consent form.  The pt was thanked for her participation.  The pt will be entered into the study by Remer Macho, Research Specialist.  Brion Aliment RN, BSN, CCRP Clinical Research Nurse Lead 02/23/2020 1:15 PM

## 2020-02-23 NOTE — Progress Notes (Signed)
Left physician form for [employer] for [place] assistance with Merleen Nicely RN for doctor to complete and return to me.  Went to infusion to give patient her portion to complete and return to me.  She has my card for any additional financial questions or concerns.

## 2020-02-23 NOTE — Assessment & Plan Note (Signed)
01/10/2020:Palpable right breast mass. Mammogram showed a 1.8cm mass at the 10:30 position 2cm from the nipple, a 1.5cm mass at the 10:30 position 7cm from the nipple, and indeterminate calcifications between the two masses in the right breast. 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 BRCA2Mutation  Treatment plan: 1. Neoadjuvant chemotherapy with Adriamycin and Cytoxan every 2 weeks4 followed by Taxol weekly 12 with carboplatinand pembrolizumabevery 3 weeks x4  2. Followed by breast conserving surgery with sentinel lymph node study vsbilateral mastectomies 3. Followed by adjuvant radiation therapyif she undergoes breast conserving surgery 4.Adjuvant olaparib URCC nausea study  Breast MRI 01/14/2020: 2 cm spiculated mass UOQ right breast, 2.1 cm spiculated mass UOQ right breast, biopsy-proven malignancies.  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  We will also change her treatment to be done every 2 weeks and a dose dense fashion instead of every 3 weeks.

## 2020-02-24 ENCOUNTER — Encounter: Payer: Self-pay | Admitting: Hematology and Oncology

## 2020-02-24 ENCOUNTER — Telehealth: Payer: Self-pay | Admitting: *Deleted

## 2020-02-24 NOTE — Progress Notes (Signed)
Received [employer] patient portion of application 3/61/44. Advised patient once I receive physician portion, I would fax completed application to [employer]. She verbalized understanding and has my card for any additional financial questions or concerns.  Received physician form from RN for [employer] for [employer].  Faxed to completed application to [employer]. Fax received ok per confirmation sheet.

## 2020-02-25 ENCOUNTER — Ambulatory Visit: Payer: BC Managed Care – PPO

## 2020-02-29 NOTE — Assessment & Plan Note (Signed)
01/10/2020:Palpable right breast mass. Mammogram showed a 1.8cm mass at the 10:30 position 2cm from the nipple, a 1.5cm mass at the 10:30 position 7cm from the nipple, and indeterminate calcifications between the two masses in the right breast. 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 BRCA2Mutation  Treatment plan: 1. Neoadjuvant chemotherapy with Adriamycin and Cytoxan every 2 weeks4 followed by Taxol weekly 12 with carboplatinand pembrolizumabevery 3 weeks x4  2. Followed by breast conserving surgery with sentinel lymph node study vsbilateral mastectomies 3. Followed by adjuvant radiation therapyif she undergoes breast conserving surgery 4.Adjuvant olaparib URCC nausea study  Breast MRI12/04/2019: 2 cm spiculated mass UOQ right breast, 2.1 cm spiculated mass UOQ right breast, biopsy-proven malignancies. 7 mm suspicious enhancing mass: Biopsy 02/02/2020: High-grade DCIS ----------------------------------------------------------------------------------------------------------------------------------------------- Current treatment: Cycle 1 neoadjuvant Adriamycin and Cytoxan every 2 weeks1/01/2021 Echocardiogram 01/14/2020: EF 55 to 60%  Chemo Toxicities:  RTC in 1 week for cycle 2

## 2020-02-29 NOTE — Progress Notes (Signed)
Patient Care Team: Robyne Peers, MD as PCP - General (Family Medicine) Love, Alyson Locket, MD (Neurology) Jerrell Belfast, MD Mauro Kaufmann, RN as Oncology Nurse Navigator Rockwell Germany, RN as Oncology Nurse Navigator Donnie Mesa, MD as Consulting Physician (General Surgery) Nicholas Lose, MD as Consulting Physician (Hematology and Oncology) Kyung Rudd, MD as Consulting Physician (Radiation Oncology)  DIAGNOSIS:    ICD-10-CM   1. Malignant neoplasm of upper-outer quadrant of right breast in female, estrogen receptor negative ([place])  C50.411    Z17.1     SUMMARY OF ONCOLOGIC HISTORY: Oncology History  Malignant neoplasm of upper-outer quadrant of right breast in female, estrogen receptor negative ([place])  01/10/2020 Initial Diagnosis   Palpable right breast mass. Mammogram showed a 1.8cm mass at the 10:30 position 2cm from the nipple, a 1.5cm mass at the 10:30 position 7cm from the nipple, and indeterminate calcifications between the two masses in the right breast. 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    01/12/2020 Cancer Staging   Staging form: Breast, AJCC 8th Edition - Clinical stage from 01/12/2020: Stage IB (cT1c, cN0, cM0, G3, ER-, PR-, HER2-) - Signed by Nicholas Lose, MD on 02/03/2020   02/23/2020 -  Chemotherapy    Patient is on Treatment Plan: BREAST DOSE DENSE AC Q14D / CARBOPLATIN D1 + PACLITAXEL D1,8,15 Q21D        CHIEF COMPLIANT: Cycle 1 Day 8 Adriamycin Cytoxan   INTERVAL HISTORY: Jacqueline Adams is a 60 y.o. with above-mentioned history of BRCA2 mutation and right breast cancer currently on neoadjuvant chemotherapy with Adriamycin and Cytoxan.She presents to the clinic todayfor a toxicity check following cycle 1.   Her major complaint is fatigue that started 3 to 4 days after chemo and is still persistent.  She is also short of breath to minimal exertion.  She is got some  sinus discharge as well as acneform lesions around the mouth.  ALLERGIES:  is allergic to procaine and relafen [nabumetone].  MEDICATIONS:  Current Outpatient Medications  Medication Sig Dispense Refill   dexamethasone (DECADRON) 4 MG tablet Take 1 tablet day before chemo and 1 tablet day after chemo with food 12 tablet 0   fexofenadine (ALLEGRA) 180 MG tablet Take 180 mg by mouth daily.     fluticasone (FLONASE) 50 MCG/ACT nasal spray Place 2 sprays into both nostrils daily. 16 g 2   HYDROcodone-acetaminophen (NORCO/VICODIN) 5-325 MG tablet Take 1 tablet by mouth every 6 (six) hours as needed for moderate pain. 15 tablet 0   ibuprofen (ADVIL) 400 MG tablet Take 1 tablet (400 mg total) by mouth every 8 (eight) hours as needed. 90 tablet 5   interferon beta-1a (AVONEX PREFILLED) 30 MCG/0.5ML PSKT injection INJECT ONE SYRINGE INTRAMUSCULARLY ONCE WEEKLY. REFRIGERATE. PROTECT FROM LIGHT. ALLOW TO WARM TO ROOM TEMPERATURE PRIOR TO USE. 3 each 3   lidocaine-prilocaine (EMLA) cream Apply to affected area once 30 g 3   Multiple Vitamins-Minerals (CENTRUM SILVER ADULT 50+ PO) Take by mouth.     ondansetron (ZOFRAN) 8 MG tablet Take 1 tablet (8 mg total) by mouth 2 (two) times daily as needed. Start on the third day after chemotherapy. 30 tablet 1   prochlorperazine (COMPAZINE) 10 MG tablet Take 1 tablet (10 mg total) by mouth every 6 (six) hours as needed (Nausea or vomiting). 30 tablet 1   simvastatin (ZOCOR) 20 MG tablet Take 1 tablet by mouth daily.  topiramate (TOPAMAX) 25 MG tablet Take 25 mg by mouth.     VENTOLIN HFA 108 (90 BASE) MCG/ACT inhaler Inhale 2 puffs into the lungs every 6 (six) hours as needed.     VITAMIN D PO Take 2 tablets by mouth daily. +calcium     No current facility-administered medications for this visit.    PHYSICAL EXAMINATION: ECOG PERFORMANCE STATUS: 1 - Symptomatic but completely ambulatory  Vitals:   03/01/20 1542  BP: (!) 144/65  Pulse: 96   Resp: 18  Temp: (!) 97 F (36.1 C)  SpO2: 96%   Filed Weights   03/01/20 1542  Weight: 218 lb 9.6 oz (99.2 kg)    LABORATORY DATA:  I have reviewed the data as listed CMP Latest Ref Rng & Units 02/23/2020 01/12/2020 09/22/2019  Glucose 70 - 99 mg/dL 138(H) 113(H) 118(H)  BUN 6 - 20 mg/dL _0 Creatinine 0.44 - 1.00 mg/dL 0.71 0.73 0.69  Sodium 135 - 145 mmol/L 142 143 144  Potassium 3.5 - 5.1 mmol/L 3.5 3.5 3.6  Chloride 98 - 111 mmol/L 108 110 107(H)  CO2 22 - 32 mmol/L _1 Calcium 8.9 - 10.3 mg/dL 9.3 9.4 9.2  Total Protein 6.5 - 8.1 g/dL 7.8 7.7 7.0  Total Bilirubin 0.3 - 1.2 mg/dL 0.4 0.3 0.2  Alkaline Phos 38 - 126 U/L 87 96 106  AST 15 - 41 U/L _2 ALT 0 - 44 U/L _3 Lab Results  Component Value Date   WBC 2.8 (L) 03/01/2020   HGB 11.0 (L) 03/01/2020   HCT 34.6 (L) 03/01/2020   MCV 77.2 (L) 03/01/2020   PLT 159 03/01/2020   NEUTROABS PENDING 03/01/2020    ASSESSMENT & PLAN:  Malignant neoplasm of upper-outer quadrant of right breast in female, estrogen receptor negative ([place]) 01/10/2020:Palpable right breast mass. Mammogram showed a 1.8cm mass at the 10:30 position 2cm from the nipple, a 1.5cm mass at the 10:30 position 7cm from the nipple, and indeterminate calcifications between the two masses in the right breast. 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 BRCA2Mutation  Treatment plan: 1. Neoadjuvant chemotherapy with Adriamycin and Cytoxan every 2 weeks4 followed by Taxol weekly 12 with carboplatinand pembrolizumabevery 3 weeks x4  2. Followed by breast conserving surgery with sentinel lymph node study vsbilateral mastectomies 3. Followed by adjuvant radiation therapyif she undergoes breast conserving surgery 4.Adjuvant olaparib URCC nausea study  Breast MRI12/04/2019: 2 cm spiculated mass UOQ right breast, 2.1 cm spiculated mass UOQ right breast,  biopsy-proven malignancies. 7 mm suspicious enhancing mass: Biopsy 02/02/2020: High-grade DCIS ----------------------------------------------------------------------------------------------------------------------------------------------- Current treatment: Cycle 1 neoadjuvant Adriamycin and Cytoxan every 2 weeks1/01/2021 Echocardiogram 01/14/2020: EF 55 to 60%  Chemo Toxicities: 1.  Fatigue 2. shortness of breath to minimal exertion: I will check a chest x-ray today.  Since she had a new port. 3.  Neutropenia: We will reduce the dosage of neck cycle of chemo. 4.  Diffuse bone pain after her interferon injection for MS  RTC in 1 week for cycle 2    No orders of the defined types were placed in this encounter.  The patient has a good understanding of the overall plan. she agrees with it. she will call with any problems that may develop before the next visit here.  Total time spent: 30 mins including face to face time and time spent for planning,  charting and coordination of care  Nicholas Lose, MD 03/01/2020  Julious Oka Dorshimer, am acting as scribe for Dr. Nicholas Lose.  I have reviewed the above documentation for accuracy and completeness, and I agree with the above.

## 2020-03-01 ENCOUNTER — Encounter: Payer: Self-pay | Admitting: *Deleted

## 2020-03-01 ENCOUNTER — Telehealth: Payer: Self-pay

## 2020-03-01 ENCOUNTER — Inpatient Hospital Stay: Payer: 59

## 2020-03-01 ENCOUNTER — Other Ambulatory Visit: Payer: Self-pay

## 2020-03-01 ENCOUNTER — Inpatient Hospital Stay (HOSPITAL_BASED_OUTPATIENT_CLINIC_OR_DEPARTMENT_OTHER): Payer: 59 | Admitting: Hematology and Oncology

## 2020-03-01 ENCOUNTER — Ambulatory Visit (HOSPITAL_COMMUNITY)
Admission: RE | Admit: 2020-03-01 | Discharge: 2020-03-01 | Disposition: A | Discharge status: Home / Self Care | Payer: 59 | Source: Ambulatory Visit | Attending: Hematology and Oncology | Admitting: Hematology and Oncology

## 2020-03-01 VITALS — BP 144/65 | HR 96 | Temp 97.0°F | Resp 18 | Ht 64.25 in | Wt 218.6 lb

## 2020-03-01 DIAGNOSIS — R0602 Shortness of breath: Secondary | ICD-10-CM | POA: Insufficient documentation

## 2020-03-01 DIAGNOSIS — Z95828 Presence of other vascular implants and grafts: Secondary | ICD-10-CM

## 2020-03-01 DIAGNOSIS — Z171 Estrogen receptor negative status [ER-]: Secondary | ICD-10-CM

## 2020-03-01 DIAGNOSIS — C50411 Malignant neoplasm of upper-outer quadrant of right female breast: Secondary | ICD-10-CM

## 2020-03-01 LAB — CBC WITH DIFFERENTIAL (CANCER CENTER ONLY)
Abs Immature Granulocytes: 0.01 10*3/uL (ref 0.00–0.07)
Basophils Absolute: 0.1 10*3/uL (ref 0.0–0.1)
Basophils Relative: 2 %
Eosinophils Absolute: 0.1 10*3/uL (ref 0.0–0.5)
Eosinophils Relative: 4 %
HCT: 34.6 % — ABNORMAL LOW (ref 36.0–46.0)
Hemoglobin: 11 g/dL — ABNORMAL LOW (ref 12.0–15.0)
Immature Granulocytes: 0 %
Lymphocytes Relative: 75 %
Lymphs Abs: 2.1 10*3/uL (ref 0.7–4.0)
MCH: 24.6 pg — ABNORMAL LOW (ref 26.0–34.0)
MCHC: 31.8 g/dL (ref 30.0–36.0)
MCV: 77.2 fL — ABNORMAL LOW (ref 80.0–100.0)
Monocytes Absolute: 0.1 10*3/uL (ref 0.1–1.0)
Monocytes Relative: 4 %
Neutro Abs: 0.4 10*3/uL — CL (ref 1.7–7.7)
Neutrophils Relative %: 15 %
Platelet Count: 159 10*3/uL (ref 150–400)
RBC: 4.48 MIL/uL (ref 3.87–5.11)
RDW: 15.6 % — ABNORMAL HIGH (ref 11.5–15.5)
WBC Count: 2.8 10*3/uL — ABNORMAL LOW (ref 4.0–10.5)
nRBC: 0 % (ref 0.0–0.2)

## 2020-03-01 LAB — CMP (CANCER CENTER ONLY)
ALT: 12 U/L (ref 0–44)
AST: 13 U/L — ABNORMAL LOW (ref 15–41)
Albumin: 3.2 g/dL — ABNORMAL LOW (ref 3.5–5.0)
Alkaline Phosphatase: 111 U/L (ref 38–126)
Anion gap: 11 (ref 5–15)
BUN: 7 mg/dL (ref 6–20)
CO2: 25 mmol/L (ref 22–32)
Calcium: 8.7 mg/dL — ABNORMAL LOW (ref 8.9–10.3)
Chloride: 106 mmol/L (ref 98–111)
Creatinine: 0.64 mg/dL (ref 0.44–1.00)
GFR, Estimated: >60 mL/min (ref >60)
Glucose, Bld: 94 mg/dL (ref 70–99)
Potassium: 3.8 mmol/L (ref 3.5–5.1)
Sodium: 142 mmol/L (ref 135–145)
Total Bilirubin: 0.3 mg/dL (ref 0.3–1.2)
Total Protein: 6.9 g/dL (ref 6.5–8.1)

## 2020-03-01 MED ORDER — SODIUM CHLORIDE 0.9% FLUSH
10.0000 mL | INTRAVENOUS | Status: DC | PRN
  Administered 2020-03-01: 10 mL via INTRAVENOUS
  Filled 2020-03-01: qty 10

## 2020-03-01 MED ORDER — HEPARIN SOD (PORK) LOCK FLUSH 100 UNIT/ML IV SOLN
500.0000 [IU] | Freq: Once | INTRAVENOUS | Status: AC
  Administered 2020-03-01: 500 [IU] via INTRAVENOUS
  Filled 2020-03-01: qty 5

## 2020-03-01 NOTE — Telephone Encounter (Signed)
CRITICAL VALUE STICKER  CRITICAL VALUE: ANC = 0.4  RECEIVER (on-site recipient of call): Yetta Glassman, [place] NOTIFIED: 03/01/20 at 3:56pm  MESSENGER (representative from lab): Pam  MD NOTIFIED: Dr. [name]  TIME OF NOTIFICATION: 03/01/2020 at 3:59pm  RESPONSE: Notification given to Merleen Nicely, RN for follow-up with provider.

## 2020-03-01 NOTE — Progress Notes (Signed)
CRITICAL VALUE ALERT  Critical Value:  ANC 0.4  Date & Time Notied:  03/01/20 1555  Provider Notified: Nicholas Lose  Orders Received/Actions taken: MD notified,verbalized understanding and no orders received at this time.

## 2020-03-02 ENCOUNTER — Telehealth: Payer: Self-pay | Admitting: Hematology and Oncology

## 2020-03-02 MED ORDER — PROCHLORPERAZINE MALEATE 10 MG PO TABS
ORAL_TABLET | ORAL | Status: AC
  Filled 2020-03-02: qty 1

## 2020-03-02 NOTE — Telephone Encounter (Signed)
No 11/9 los, no changes made to pt schedule  

## 2020-03-07 NOTE — Assessment & Plan Note (Signed)
(  [place]) 01/10/2020:Palpable right breast mass. Mammogram showed a 1.8cm mass at the 10:30 position 2cm from the nipple, a 1.5cm mass at the 10:30 position 7cm from the nipple, and indeterminate calcifications between the two masses in the right breast. 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 BRCA2Mutation  Treatment plan: 1. Neoadjuvant chemotherapy with Adriamycin and Cytoxan every2weeks4 followed by Taxol weekly 12 with carboplatinand pembrolizumabevery 3 weeks x4  2. Followed by breast conserving surgery with sentinel lymph node study vsbilateral mastectomies 3. Followed by adjuvant radiation therapyif she undergoes breast conserving surgery 4.Adjuvant olaparib URCC nausea study  Breast MRI12/04/2019: 2 cm spiculated mass UOQ right breast, 2.1 cm spiculated mass UOQ right breast, biopsy-proven malignancies. 7 mm suspicious enhancing mass: Biopsy 02/02/2020: High-grade DCIS ----------------------------------------------------------------------------------------------------------------------------------------------- Current treatment: Cycle 2 neoadjuvant Adriamycin and Cytoxan every2weeks started1/01/2021 Echocardiogram 01/14/2020: EF 55 to 60%  Chemo Toxicities: 1.  Fatigue 2. shortness of breath to minimal exertion: I will check a chest x-ray today.  Since she had a new port. 3.  Neutropenia: We will reduce the dosage of neck cycle of chemo. 4.  Diffuse bone pain after her interferon injection for MS  RTC in 2 weeks for cycle three

## 2020-03-07 NOTE — Progress Notes (Signed)
Patient Care Team: Robyne Peers, MD as PCP - General (Family Medicine) Love, Alyson Locket, MD (Neurology) Jerrell Belfast, MD Mauro Kaufmann, RN as Oncology Nurse Navigator Rockwell Germany, RN as Oncology Nurse Navigator Donnie Mesa, MD as Consulting Physician (General Surgery) Nicholas Lose, MD as Consulting Physician (Hematology and Oncology) Kyung Rudd, MD as Consulting Physician (Radiation Oncology)  DIAGNOSIS:    ICD-10-CM   1. Malignant neoplasm of upper-outer quadrant of right breast in female, estrogen receptor negative ([place])  C50.411    Z17.1     SUMMARY OF ONCOLOGIC HISTORY: Oncology History  Malignant neoplasm of upper-outer quadrant of right breast in female, estrogen receptor negative ([place])  01/10/2020 Initial Diagnosis   Palpable right breast mass. Mammogram showed a 1.8cm mass at the 10:30 position 2cm from the nipple, a 1.5cm mass at the 10:30 position 7cm from the nipple, and indeterminate calcifications between the two masses in the right breast. 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    01/12/2020 Cancer Staging   Staging form: Breast, AJCC 8th Edition - Clinical stage from 01/12/2020: Stage IB (cT1c, cN0, cM0, G3, ER-, PR-, HER2-) - Signed by Nicholas Lose, MD on 02/03/2020   02/23/2020 -  Chemotherapy    Patient is on Treatment Plan: BREAST DOSE DENSE AC Q14D / CARBOPLATIN D1 + PACLITAXEL D1,8,15 Q21D        CHIEF COMPLIANT: Cycle 2 Day 1AdriamycinCytoxan  INTERVAL HISTORY: Jacqueline Adams is a 60 y.o. with above-mentioned history of BRCA2 mutation and right breast cancer currently on neoadjuvant chemotherapy with Adriamycin and Cytoxan.She presents to the clinic todayfor a toxicity check and cycle 2. she has recovered very well from shortness of breath.  Energy levels have improved.  She is eating better.  Does not have much of nausea.  ALLERGIES:  is allergic to  procaine and relafen [nabumetone].  MEDICATIONS:  Current Outpatient Medications  Medication Sig Dispense Refill   dexamethasone (DECADRON) 4 MG tablet Take 1 tablet day before chemo and 1 tablet day after chemo with food 12 tablet 0   fexofenadine (ALLEGRA) 180 MG tablet Take 180 mg by mouth daily.     fluticasone (FLONASE) 50 MCG/ACT nasal spray Place 2 sprays into both nostrils daily. 16 g 2   HYDROcodone-acetaminophen (NORCO/VICODIN) 5-325 MG tablet Take 1 tablet by mouth every 6 (six) hours as needed for moderate pain. 15 tablet 0   ibuprofen (ADVIL) 400 MG tablet Take 1 tablet (400 mg total) by mouth every 8 (eight) hours as needed. 90 tablet 5   interferon beta-1a (AVONEX PREFILLED) 30 MCG/0.5ML PSKT injection INJECT ONE SYRINGE INTRAMUSCULARLY ONCE WEEKLY. REFRIGERATE. PROTECT FROM LIGHT. ALLOW TO WARM TO ROOM TEMPERATURE PRIOR TO USE. 3 each 3   lidocaine-prilocaine (EMLA) cream Apply to affected area once 30 g 3   Multiple Vitamins-Minerals (CENTRUM SILVER ADULT 50+ PO) Take by mouth.     ondansetron (ZOFRAN) 8 MG tablet Take 1 tablet (8 mg total) by mouth 2 (two) times daily as needed. Start on the third day after chemotherapy. 30 tablet 1   prochlorperazine (COMPAZINE) 10 MG tablet Take 1 tablet (10 mg total) by mouth every 6 (six) hours as needed (Nausea or vomiting). 30 tablet 1   simvastatin (ZOCOR) 20 MG tablet Take 1 tablet by mouth daily.     topiramate (TOPAMAX) 25 MG tablet Take 25 mg by mouth.     VENTOLIN HFA 108 (  90 BASE) MCG/ACT inhaler Inhale 2 puffs into the lungs every 6 (six) hours as needed.     VITAMIN D PO Take 2 tablets by mouth daily. +calcium     No current facility-administered medications for this visit.    PHYSICAL EXAMINATION: ECOG PERFORMANCE STATUS: 1 - Symptomatic but completely ambulatory  Vitals:   03/08/20 1119  BP: 118/79  Pulse: 97  Resp: 18  Temp: 97.9 F (36.6 C)  SpO2: 98%   Filed Weights   03/08/20 1119  Weight: 217  lb 8 oz (98.7 kg)     LABORATORY DATA:  I have reviewed the data as listed CMP Latest Ref Rng & Units 03/01/2020 02/23/2020 01/12/2020  Glucose 70 - 99 mg/dL 94 138(H) 113(H)  BUN 6 - 20 mg/dL $Remove'7 10 8  'ZkpKRQM$ Creatinine 0.44 - 1.00 mg/dL 0.64 0.71 0.73  Sodium 135 - 145 mmol/L 142 142 143  Potassium 3.5 - 5.1 mmol/L 3.8 3.5 3.5  Chloride 98 - 111 mmol/L 106 108 110  CO2 22 - 32 mmol/L $RemoveB'25 24 27  'wFGadnmr$ Calcium 8.9 - 10.3 mg/dL 8.7(L) 9.3 9.4  Total Protein 6.5 - 8.1 g/dL 6.9 7.8 7.7  Total Bilirubin 0.3 - 1.2 mg/dL 0.3 0.4 0.3  Alkaline Phos 38 - 126 U/L 111 87 96  AST 15 - 41 U/L 13(L) 17 16  ALT 0 - 44 U/L $Remo'12 17 15    'ioQks$ Lab Results  Component Value Date   WBC 12.0 (H) 03/08/2020   HGB 11.3 (L) 03/08/2020   HCT 35.3 (L) 03/08/2020   MCV 78.1 (L) 03/08/2020   PLT 271 03/08/2020   NEUTROABS PENDING 03/08/2020    ASSESSMENT & PLAN:  Malignant neoplasm of upper-outer quadrant of right breast in female, estrogen receptor negative ([place]) ([place]) 01/10/2020:Palpable right breast mass. Mammogram showed a 1.8cm mass at the 10:30 position 2cm from the nipple, a 1.5cm mass at the 10:30 position 7cm from the nipple, and indeterminate calcifications between the two masses in the right breast. 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 BRCA2Mutation  Treatment plan: 1. Neoadjuvant chemotherapy with Adriamycin and Cytoxan every2weeks4 followed by Taxol weekly 12 with carboplatinand every 3 weeks x4  2. Followed by breast conserving surgery with sentinel lymph node study vsbilateral mastectomies 3. Followed by adjuvant radiation therapyif she undergoes breast conserving surgery 4.Adjuvant olaparib URCC nausea study  Breast MRI12/04/2019: 2 cm spiculated mass UOQ right breast, 2.1 cm spiculated mass UOQ right breast, biopsy-proven malignancies. 7 mm suspicious enhancing mass: Biopsy 02/02/2020: High-grade  DCIS ----------------------------------------------------------------------------------------------------------------------------------------------- Current treatment: Cycle 2 neoadjuvant Adriamycin and Cytoxan every2weeks started1/01/2021 Echocardiogram 01/14/2020: EF 55 to 60%  Chemo Toxicities: 1.  Fatigue 2. shortness of breath to minimal exertion: Chest x-ray was negative.  She is feeling much better.  Is related to fatigue. 3.  Neutropenia: Reduce dose of chemo with cycle 2. 4.  Diffuse bone pain after her interferon injection for MS. better after taking Advil on the day after her interferon injection.  RTC in 2 weeks for cycle three    No orders of the defined types were placed in this encounter.  The patient has a good understanding of the overall plan. she agrees with it. she will call with any problems that may develop before the next visit here.  Total time spent: 30 mins including face to face time and time spent for planning, charting and coordination of care  Nicholas Lose, MD 03/08/2020  I, Molly Dorshimer, am acting as scribe for Dr. Lynk Marti. ° °I have reviewed the above documentation for accuracy and completeness, and I agree with the above. ° ° ° ° ° ° °

## 2020-03-08 ENCOUNTER — Inpatient Hospital Stay: Payer: 59

## 2020-03-08 ENCOUNTER — Inpatient Hospital Stay: Payer: 59 | Admitting: Hematology and Oncology

## 2020-03-08 ENCOUNTER — Other Ambulatory Visit: Payer: Self-pay

## 2020-03-08 DIAGNOSIS — Z171 Estrogen receptor negative status [ER-]: Secondary | ICD-10-CM

## 2020-03-08 DIAGNOSIS — Z95828 Presence of other vascular implants and grafts: Secondary | ICD-10-CM

## 2020-03-08 DIAGNOSIS — C50411 Malignant neoplasm of upper-outer quadrant of right female breast: Secondary | ICD-10-CM

## 2020-03-08 LAB — CBC WITH DIFFERENTIAL (CANCER CENTER ONLY)
Abs Immature Granulocytes: 0.77 10*3/uL — ABNORMAL HIGH (ref 0.00–0.07)
Basophils Absolute: 0.1 10*3/uL (ref 0.0–0.1)
Basophils Relative: 1 %
Eosinophils Absolute: 0 10*3/uL (ref 0.0–0.5)
Eosinophils Relative: 0 %
HCT: 35.3 % — ABNORMAL LOW (ref 36.0–46.0)
Hemoglobin: 11.3 g/dL — ABNORMAL LOW (ref 12.0–15.0)
Immature Granulocytes: 6 %
Lymphocytes Relative: 26 %
Lymphs Abs: 3.1 10*3/uL (ref 0.7–4.0)
MCH: 25 pg — ABNORMAL LOW (ref 26.0–34.0)
MCHC: 32 g/dL (ref 30.0–36.0)
MCV: 78.1 fL — ABNORMAL LOW (ref 80.0–100.0)
Monocytes Absolute: 0.9 10*3/uL (ref 0.1–1.0)
Monocytes Relative: 8 %
Neutro Abs: 7.1 10*3/uL (ref 1.7–7.7)
Neutrophils Relative %: 59 %
Platelet Count: 271 10*3/uL (ref 150–400)
RBC: 4.52 MIL/uL (ref 3.87–5.11)
RDW: 16.3 % — ABNORMAL HIGH (ref 11.5–15.5)
WBC Count: 12 10*3/uL — ABNORMAL HIGH (ref 4.0–10.5)
nRBC: 0.2 % (ref 0.0–0.2)

## 2020-03-08 LAB — CMP (CANCER CENTER ONLY)
ALT: 21 U/L (ref 0–44)
AST: 20 U/L (ref 15–41)
Albumin: 3.4 g/dL — ABNORMAL LOW (ref 3.5–5.0)
Alkaline Phosphatase: 104 U/L (ref 38–126)
Anion gap: 8 (ref 5–15)
BUN: 6 mg/dL (ref 6–20)
CO2: 26 mmol/L (ref 22–32)
Calcium: 8.8 mg/dL — ABNORMAL LOW (ref 8.9–10.3)
Chloride: 108 mmol/L (ref 98–111)
Creatinine: 0.69 mg/dL (ref 0.44–1.00)
GFR, Estimated: >60 mL/min (ref >60)
Glucose, Bld: 115 mg/dL — ABNORMAL HIGH (ref 70–99)
Potassium: 3.6 mmol/L (ref 3.5–5.1)
Sodium: 142 mmol/L (ref 135–145)
Total Bilirubin: 0.2 mg/dL — ABNORMAL LOW (ref 0.3–1.2)
Total Protein: 7 g/dL (ref 6.5–8.1)

## 2020-03-08 LAB — TSH: TSH: 0.873 u[IU]/mL (ref 0.308–3.960)

## 2020-03-08 MED ORDER — PALONOSETRON HCL INJECTION 0.25 MG/5ML
0.2500 mg | Freq: Once | INTRAVENOUS | Status: AC
  Administered 2020-03-08: 0.25 mg via INTRAVENOUS

## 2020-03-08 MED ORDER — HEPARIN SOD (PORK) LOCK FLUSH 100 UNIT/ML IV SOLN
500.0000 [IU] | Freq: Once | INTRAVENOUS | Status: AC | PRN
  Administered 2020-03-08: 500 [IU]
  Filled 2020-03-08: qty 5

## 2020-03-08 MED ORDER — SODIUM CHLORIDE 0.9% FLUSH
10.0000 mL | INTRAVENOUS | Status: DC | PRN
  Administered 2020-03-08: 10 mL via INTRAVENOUS
  Filled 2020-03-08: qty 10

## 2020-03-08 MED ORDER — PALONOSETRON HCL INJECTION 0.25 MG/5ML
INTRAVENOUS | Status: AC
  Filled 2020-03-08: qty 5

## 2020-03-08 MED ORDER — SODIUM CHLORIDE 0.9 % IV SOLN
10.0000 mg | Freq: Once | INTRAVENOUS | Status: AC
  Administered 2020-03-08: 10 mg via INTRAVENOUS
  Filled 2020-03-08: qty 10

## 2020-03-08 MED ORDER — SODIUM CHLORIDE 0.9% FLUSH
10.0000 mL | INTRAVENOUS | Status: DC | PRN
  Administered 2020-03-08: 10 mL
  Filled 2020-03-08: qty 10

## 2020-03-08 MED ORDER — SODIUM CHLORIDE 0.9 % IV SOLN
150.0000 mg | Freq: Once | INTRAVENOUS | Status: AC
  Administered 2020-03-08: 150 mg via INTRAVENOUS
  Filled 2020-03-08: qty 150

## 2020-03-08 MED ORDER — SODIUM CHLORIDE 0.9 % IV SOLN
Freq: Once | INTRAVENOUS | Status: AC
  Filled 2020-03-08: qty 250

## 2020-03-08 MED ORDER — DOXORUBICIN HCL CHEMO IV INJECTION 2 MG/ML
50.0000 mg/m2 | Freq: Once | INTRAVENOUS | Status: AC
  Administered 2020-03-08: 106 mg via INTRAVENOUS
  Filled 2020-03-08: qty 53

## 2020-03-08 MED ORDER — SODIUM CHLORIDE 0.9 % IV SOLN
500.0000 mg/m2 | Freq: Once | INTRAVENOUS | Status: AC
  Administered 2020-03-08: 1060 mg via INTRAVENOUS
  Filled 2020-03-08: qty 53

## 2020-03-08 NOTE — Patient Instructions (Signed)
[facility] Discharge Instructions for Patients Receiving Chemotherapy  Today you received the following chemotherapy agents: doxorubicin and cyclophosphamide.  To help prevent nausea and vomiting after your treatment, we encourage you to take your nausea medication as directed.   If you develop nausea and vomiting that is not controlled by your nausea medication, call the clinic.   BELOW ARE SYMPTOMS THAT SHOULD BE REPORTED IMMEDIATELY:  *FEVER GREATER THAN 100.5 F  *CHILLS WITH OR WITHOUT FEVER  NAUSEA AND VOMITING THAT IS NOT CONTROLLED WITH YOUR NAUSEA MEDICATION  *UNUSUAL SHORTNESS OF BREATH  *UNUSUAL BRUISING OR BLEEDING  TENDERNESS IN MOUTH AND THROAT WITH OR WITHOUT PRESENCE OF ULCERS  *URINARY PROBLEMS  *BOWEL PROBLEMS  UNUSUAL RASH Items with * indicate a potential emergency and should be followed up as soon as possible.  Feel free to call the clinic should you have any questions or concerns. The clinic phone number is [phone].  Please show the CHEMO ALERT CARD at check-in to the Emergency Department and triage nurse.   

## 2020-03-08 NOTE — Progress Notes (Signed)
Blood return noted before, during and after Adriamycin push

## 2020-03-10 ENCOUNTER — Ambulatory Visit: Payer: BC Managed Care – PPO

## 2020-03-13 ENCOUNTER — Telehealth: Payer: Self-pay | Admitting: *Deleted

## 2020-03-13 ENCOUNTER — Telehealth: Payer: Self-pay | Admitting: Hematology and Oncology

## 2020-03-13 DIAGNOSIS — G35 Multiple sclerosis: Secondary | ICD-10-CM

## 2020-03-13 NOTE — Telephone Encounter (Addendum)
Received fax from [employer] that [place] is approved 03/13/20-03/13/21. PA# Dianna Rossetti [id number].

## 2020-03-13 NOTE — Telephone Encounter (Signed)
Scheduled per 1/26 los. Pt will receive an updated appt calendar per next visit appt notes  °

## 2020-03-13 NOTE — Telephone Encounter (Signed)
Faxed completed/signed PA [employer] back to [employer] at [phone]. Received fax confirmation. Waiting on determination.

## 2020-03-20 ENCOUNTER — Encounter: Payer: Self-pay | Admitting: Hematology and Oncology

## 2020-03-20 NOTE — Progress Notes (Signed)
Received message back from physician regarding [place].  Called [employer]([place]) to withdraw application based on physician response to avoid future calls as received before regarding clinical intake worksheet.   Message sent to provider and RN to advise patient of decision/explanation.  Patient has my card for any additional financial questions or concerns.

## 2020-03-21 ENCOUNTER — Encounter: Payer: Self-pay | Admitting: *Deleted

## 2020-03-22 ENCOUNTER — Inpatient Hospital Stay: Payer: 59 | Admitting: Medical

## 2020-03-22 ENCOUNTER — Inpatient Hospital Stay: Payer: 59 | Admitting: Hematology and Oncology

## 2020-03-22 ENCOUNTER — Inpatient Hospital Stay: Payer: 59

## 2020-03-22 ENCOUNTER — Inpatient Hospital Stay: Payer: 59 | Attending: Hematology and Oncology

## 2020-03-22 ENCOUNTER — Other Ambulatory Visit: Payer: Self-pay

## 2020-03-22 VITALS — BP 139/79 | HR 102 | Temp 99.8°F | Resp 14 | Ht 64.2 in | Wt 219.5 lb

## 2020-03-22 DIAGNOSIS — C50411 Malignant neoplasm of upper-outer quadrant of right female breast: Secondary | ICD-10-CM | POA: Diagnosis not present

## 2020-03-22 DIAGNOSIS — Z8041 Family history of malignant neoplasm of ovary: Secondary | ICD-10-CM | POA: Insufficient documentation

## 2020-03-22 DIAGNOSIS — Z8042 Family history of malignant neoplasm of prostate: Secondary | ICD-10-CM | POA: Diagnosis not present

## 2020-03-22 DIAGNOSIS — Z801 Family history of malignant neoplasm of trachea, bronchus and lung: Secondary | ICD-10-CM | POA: Insufficient documentation

## 2020-03-22 DIAGNOSIS — Z5111 Encounter for antineoplastic chemotherapy: Secondary | ICD-10-CM | POA: Diagnosis not present

## 2020-03-22 DIAGNOSIS — Z803 Family history of malignant neoplasm of breast: Secondary | ICD-10-CM | POA: Insufficient documentation

## 2020-03-22 DIAGNOSIS — Z171 Estrogen receptor negative status [ER-]: Secondary | ICD-10-CM

## 2020-03-22 DIAGNOSIS — Z800 Family history of malignant neoplasm of digestive organs: Secondary | ICD-10-CM | POA: Diagnosis not present

## 2020-03-22 DIAGNOSIS — Z808 Family history of malignant neoplasm of other organs or systems: Secondary | ICD-10-CM | POA: Diagnosis not present

## 2020-03-22 LAB — CBC WITH DIFFERENTIAL (CANCER CENTER ONLY)
Abs Immature Granulocytes: 1.75 10*3/uL — ABNORMAL HIGH (ref 0.00–0.07)
Basophils Absolute: 0.1 10*3/uL (ref 0.0–0.1)
Basophils Relative: 0 %
Eosinophils Absolute: 0 10*3/uL (ref 0.0–0.5)
Eosinophils Relative: 0 %
HCT: 34.1 % — ABNORMAL LOW (ref 36.0–46.0)
Hemoglobin: 11 g/dL — ABNORMAL LOW (ref 12.0–15.0)
Immature Granulocytes: 8 %
Lymphocytes Relative: 12 %
Lymphs Abs: 2.6 10*3/uL (ref 0.7–4.0)
MCH: 25.1 pg — ABNORMAL LOW (ref 26.0–34.0)
MCHC: 32.3 g/dL (ref 30.0–36.0)
MCV: 77.7 fL — ABNORMAL LOW (ref 80.0–100.0)
Monocytes Absolute: 1.2 10*3/uL — ABNORMAL HIGH (ref 0.1–1.0)
Monocytes Relative: 6 %
Neutro Abs: 16 10*3/uL — ABNORMAL HIGH (ref 1.7–7.7)
Neutrophils Relative %: 74 %
Platelet Count: 201 10*3/uL (ref 150–400)
RBC: 4.39 MIL/uL (ref 3.87–5.11)
RDW: 17.5 % — ABNORMAL HIGH (ref 11.5–15.5)
WBC Count: 21.6 10*3/uL — ABNORMAL HIGH (ref 4.0–10.5)
nRBC: 0.1 % (ref 0.0–0.2)

## 2020-03-22 LAB — CMP (CANCER CENTER ONLY)
ALT: 18 U/L (ref 0–44)
AST: 22 U/L (ref 15–41)
Albumin: 3.5 g/dL (ref 3.5–5.0)
Alkaline Phosphatase: 125 U/L (ref 38–126)
Anion gap: 8 (ref 5–15)
BUN: 9 mg/dL (ref 6–20)
CO2: 25 mmol/L (ref 22–32)
Calcium: 8.9 mg/dL (ref 8.9–10.3)
Chloride: 108 mmol/L (ref 98–111)
Creatinine: 0.66 mg/dL (ref 0.44–1.00)
GFR, Estimated: >60 mL/min (ref >60)
Glucose, Bld: 113 mg/dL — ABNORMAL HIGH (ref 70–99)
Potassium: 3.8 mmol/L (ref 3.5–5.1)
Sodium: 141 mmol/L (ref 135–145)
Total Bilirubin: <0.2 mg/dL — ABNORMAL LOW (ref 0.3–1.2)
Total Protein: 6.8 g/dL (ref 6.5–8.1)

## 2020-03-22 MED ORDER — HEPARIN SOD (PORK) LOCK FLUSH 100 UNIT/ML IV SOLN
500.0000 [IU] | Freq: Once | INTRAVENOUS | Status: AC | PRN
  Administered 2020-03-22: 500 [IU]
  Filled 2020-03-22: qty 5

## 2020-03-22 MED ORDER — DEXAMETHASONE SODIUM PHOSPHATE 100 MG/10ML IJ SOLN
10.0000 mg | Freq: Once | INTRAMUSCULAR | Status: AC
  Administered 2020-03-22: 10 mg via INTRAVENOUS
  Filled 2020-03-22: qty 10

## 2020-03-22 MED ORDER — SODIUM CHLORIDE 0.9 % IV SOLN
150.0000 mg | Freq: Once | INTRAVENOUS | Status: AC
  Administered 2020-03-22: 150 mg via INTRAVENOUS
  Filled 2020-03-22: qty 150

## 2020-03-22 MED ORDER — PALONOSETRON HCL INJECTION 0.25 MG/5ML
INTRAVENOUS | Status: AC
  Filled 2020-03-22: qty 5

## 2020-03-22 MED ORDER — DOXORUBICIN HCL CHEMO IV INJECTION 2 MG/ML
50.0000 mg/m2 | Freq: Once | INTRAVENOUS | Status: AC
  Administered 2020-03-22: 106 mg via INTRAVENOUS
  Filled 2020-03-22: qty 53

## 2020-03-22 MED ORDER — SODIUM CHLORIDE 0.9 % IV SOLN
500.0000 mg/m2 | Freq: Once | INTRAVENOUS | Status: AC
  Administered 2020-03-22: 1060 mg via INTRAVENOUS
  Filled 2020-03-22: qty 53

## 2020-03-22 MED ORDER — SODIUM CHLORIDE 0.9% FLUSH
10.0000 mL | INTRAVENOUS | Status: DC | PRN
  Administered 2020-03-22: 10 mL
  Filled 2020-03-22: qty 10

## 2020-03-22 MED ORDER — PALONOSETRON HCL INJECTION 0.25 MG/5ML
0.2500 mg | Freq: Once | INTRAVENOUS | Status: AC
  Administered 2020-03-22: 0.25 mg via INTRAVENOUS

## 2020-03-22 MED ORDER — SODIUM CHLORIDE 0.9 % IV SOLN
Freq: Once | INTRAVENOUS | Status: AC
  Filled 2020-03-22: qty 250

## 2020-03-22 NOTE — Patient Instructions (Signed)
[facility] Discharge Instructions for Patients Receiving Chemotherapy  Today you received the following chemotherapy agents: doxorubicin and cyclophosphamide.  To help prevent nausea and vomiting after your treatment, we encourage you to take your nausea medication as directed.   If you develop nausea and vomiting that is not controlled by your nausea medication, call the clinic.   BELOW ARE SYMPTOMS THAT SHOULD BE REPORTED IMMEDIATELY:  *FEVER GREATER THAN 100.5 F  *CHILLS WITH OR WITHOUT FEVER  NAUSEA AND VOMITING THAT IS NOT CONTROLLED WITH YOUR NAUSEA MEDICATION  *UNUSUAL SHORTNESS OF BREATH  *UNUSUAL BRUISING OR BLEEDING  TENDERNESS IN MOUTH AND THROAT WITH OR WITHOUT PRESENCE OF ULCERS  *URINARY PROBLEMS  *BOWEL PROBLEMS  UNUSUAL RASH Items with * indicate a potential emergency and should be followed up as soon as possible.  Feel free to call the clinic should you have any questions or concerns. The clinic phone number is [phone].  Please show the CHEMO ALERT CARD at check-in to the Emergency Department and triage nurse.   

## 2020-03-22 NOTE — Progress Notes (Signed)
Per Sandi Mealy, PA ok to treat with HR 102.

## 2020-03-22 NOTE — Progress Notes (Signed)
[facility] Progress Note   Jacqueline Adams [id number] December 19, 1960 42 y.oHortense Adams is managed by Dr. Nicholas Lose  Actively treated with chemotherapy/immunotherapy/hormonal therapy: yes  Current therapy: Adriamycin and Cytoxan  Last treated: 03/08/2020 (cycle 2, day 1)  Next scheduled appointment with provider: 04/05/2020  Assessment: Plan:    Malignant neoplasm of upper-outer quadrant of right breast in female, estrogen receptor negative ([place])   [name] presents to the office today for consideration of cycle 3, day 1 of Adriamycin and Cytoxan.  We will proceed with her treatment today and will have the patient return to the clinic for routine follow-up and for consideration of her next cycle of chemotherapy on 04/05/2020.  Please see After Visit Summary for patient specific instructions.  Future Appointments  Date Time Provider [place]  04/05/2020  9:30 AM [facility] None  04/05/2020  9:45 AM [facility] [place] FLUSH [facility] None  04/05/2020 10:15 AM Nicholas Lose, MD [facility] None  04/05/2020 11:15 AM [facility] INFUSION [facility] None  04/19/2020  9:30 AM [facility] LAB [facility] None  04/19/2020  9:45 AM [facility] [place] FLUSH [facility] None  04/19/2020 10:15 AM Nicholas Lose, MD [facility] None  04/19/2020 11:15 AM [facility] INFUSION [facility] None  04/26/2020 10:30 AM [facility] LAB [facility] None  04/26/2020 10:45 AM [facility] [place] FLUSH [facility] None  04/26/2020 11:15 AM Nicholas Lose, MD [facility] None  04/26/2020 12:15 PM [facility] INFUSION [facility] None  05/03/2020 11:15 AM [facility] LAB [facility] None  05/03/2020 11:30 AM [facility] [place] FLUSH [facility] None  05/03/2020 12:30 PM [facility] INFUSION [facility] None    No orders of the defined types were placed in this encounter.      Subjective:   Patient ID:  Jacqueline Adams is a 60 y.o. (DOB 1960-04-01) female.  Chief Complaint:  No chief complaint on file.   HPI Jacqueline Adams  is a 60 y.o. female with a diagnosis of an ER negative malignant neoplasm of the right breast.  She is followed by Dr. Nicholas Lose and presents to the office today for consideration of cycle 3, day 1 of Adriamycin and Cytoxan.  She is doing well overall with no acute issues of concern.  She denies fevers, chills, sweats, nausea, vomiting, constipation, or diarrhea.  Medications: I have reviewed the patient's current medications.  Allergies:  Allergies  Allergen Reactions  . Procaine Swelling  . Relafen [Nabumetone] Swelling    Past Medical History:  Diagnosis Date  . Breast cancer ([place]) 01/2020   right breast IDC  . Bronchitis   . Cervical spondylarthritis   . Headache(784.0)   . High cholesterol   . Leukocytosis 2009  . Multiple sclerosis ([place]) 2009   followed by Dr. Erling Cruz  . Rhinitis   . Seasonal allergies   . Sinusitis     Past Surgical History:  Procedure Laterality Date  . CESAREAN SECTION     x 3  . CORRECTION HAMMER TOE Bilateral   . PARTIAL HYSTERECTOMY  2006   for uterine fibroid.   [name] PORTACATH PLACEMENT Right 02/22/2020   Procedure: RIGHT SUBCLAVIAN VEIN PORT PLACEMENT;  Surgeon: Donnie Mesa, MD;  Location: [facility];  Service: General;  Laterality: Right;    Family History  Problem Relation Age of Onset  . Cancer Mother 72       ovarian cancer; now with suspected breast cancer  . Diabetes Mother        also HTN  . Breast cancer Mother   .  Clotting disorder Father   . Prostate cancer Father   . Breast cancer Maternal Aunt   . Cancer Maternal Grandmother        unknown female reproductive organ cancer  . Breast cancer Maternal Aunt   . Breast cancer Maternal Aunt   . Colon cancer Sister 50       anal cancer (2014)  . Lung cancer Sister 79  . Stroke Sister 71  . Ovarian cancer Sister 3  . BRCA 1/2 Sister     Social History   Socioeconomic History  . Marital status:  Married    Spouse name: Jacqueline Adams  . Number of children: 2  . Years of education: College  . Highest education level: Not on file  Occupational History  . Occupation: Print production planner: [employer]  Tobacco Use  . Smoking status: Never Smoker  . Smokeless tobacco: Never Used  Vaping Use  . Vaping Use: Never used  Substance and Sexual Activity  . Alcohol use: No  . Drug use: No  . Sexual activity: Yes    Birth control/protection: Surgical  Other Topics Concern  . Not on file  Social History Narrative   Patient lives at home with her spouse.   Caffeine Use: 2 sodas daily   Social Determinants of Health   Financial Resource Strain: Low Risk   . Difficulty of Paying Living Expenses: Not hard at all  Food Insecurity: No Food Insecurity  . Worried About Charity fundraiser in the Last Year: Never true  . Ran Out of Food in the Last Year: Never true  Transportation Needs: No Transportation Needs  . Lack of Transportation (Medical): No  . Lack of Transportation (Non-Medical): No  Physical Activity: Not on file  Stress: Not on file  Social Connections: Socially Integrated  . Frequency of Communication with Friends and Family: More than three times a week  . Frequency of Social Gatherings with Friends and Family: Not on file  . Attends Religious Services: More than 4 times per year  . Active Member of Clubs or Organizations: Not on file  . Attends Archivist Meetings: More than 4 times per year  . Marital Status: Married  Human resources officer Violence: Not on file    Past Medical History, Surgical history, Social history, and Family history were reviewed and updated as appropriate.   Please see review of systems for further details on the patient's review from today.   Review of Systems:  Review of Systems  Constitutional: Negative for chills, diaphoresis and fever.  HENT: Negative for trouble swallowing and voice change.   Respiratory: Negative for cough, chest  tightness, shortness of breath and wheezing.   Cardiovascular: Negative for chest pain and palpitations.  Gastrointestinal: Negative for abdominal pain, constipation, diarrhea, nausea and vomiting.  Musculoskeletal: Negative for back pain and myalgias.  Neurological: Negative for dizziness, light-headedness and headaches.    Objective:   Physical Exam:  BP 139/79 (BP Location: Left Arm, Patient Position: Sitting)   Pulse (!) 102   Temp 99.8 F (37.7 C) (Tympanic)   Resp 14   Ht 5' 4.2" (1.631 m)   Wt 219 lb 8 oz (99.6 kg)   SpO2 99%   BMI 37.44 kg/m  ECOG: 0  Physical Exam Constitutional:      General: She is not in acute distress.    Appearance: She is not diaphoretic.  HENT:     Head: Normocephalic and atraumatic.  Eyes:  General: No scleral icterus.       Right eye: No discharge.        Left eye: No discharge.     Conjunctiva/sclera: Conjunctivae normal.  Cardiovascular:     Rate and Rhythm: Normal rate and regular rhythm.     Heart sounds: Normal heart sounds. No murmur heard. No friction rub. No gallop.   Pulmonary:     Effort: Pulmonary effort is normal. No respiratory distress.     Breath sounds: Normal breath sounds. No wheezing or rales.  Abdominal:     General: Bowel sounds are normal. There is no distension.     Palpations: Abdomen is soft.     Tenderness: There is no abdominal tenderness. There is no guarding.  Musculoskeletal:     Right lower leg: No edema.     Left lower leg: No edema.  Skin:    General: Skin is warm and dry.     Findings: No erythema or rash.  Neurological:     Mental Status: She is alert.     Coordination: Coordination normal.     Gait: Gait normal.  Psychiatric:        Mood and Affect: Mood normal.        Behavior: Behavior normal.        Thought Content: Thought content normal.        Judgment: Judgment normal.     Lab Review:     Component Value Date/Time   NA 141 03/22/2020 1130   NA 144 09/22/2019 0840   NA  143 11/22/2013 1514   K 3.8 03/22/2020 1130   K 3.1 (L) 11/22/2013 1514   CL 108 03/22/2020 1130   CL 105 02/18/2012 1207   CO2 25 03/22/2020 1130   CO2 25 11/22/2013 1514   GLUCOSE 113 (H) 03/22/2020 1130   GLUCOSE 105 11/22/2013 1514   GLUCOSE 96 02/18/2012 1207   BUN 9 03/22/2020 1130   BUN 7 09/22/2019 0840   BUN 6.6 (L) 11/22/2013 1514   CREATININE 0.66 03/22/2020 1130   CREATININE 0.8 11/22/2013 1514   CALCIUM 8.9 03/22/2020 1130   CALCIUM 9.0 11/22/2013 1514   PROT 6.8 03/22/2020 1130   PROT 7.0 09/22/2019 0840   PROT 7.4 11/22/2013 1514   ALBUMIN 3.5 03/22/2020 1130   ALBUMIN 4.0 09/22/2019 0840   ALBUMIN 3.3 (L) 11/22/2013 1514   AST 22 03/22/2020 1130   AST 15 11/22/2013 1514   ALT 18 03/22/2020 1130   ALT 13 11/22/2013 1514   ALKPHOS 125 03/22/2020 1130   ALKPHOS 91 11/22/2013 1514   BILITOT <0.2 (L) 03/22/2020 1130   BILITOT 0.23 11/22/2013 1514   GFRNONAA >60 03/22/2020 1130   GFRAA 113 09/22/2019 0840       Component Value Date/Time   WBC 21.6 (H) 03/22/2020 1130   WBC 11.6 (H) 12/30/2017 1601   RBC 4.39 03/22/2020 1130   HGB 11.0 (L) 03/22/2020 1130   HGB 12.4 09/22/2019 0840   HGB 11.5 (L) 11/22/2013 1514   HCT 34.1 (L) 03/22/2020 1130   HCT 37.5 09/22/2019 0840   HCT 36.5 11/22/2013 1514   PLT 201 03/22/2020 1130   PLT 329 09/22/2019 0840   MCV 77.7 (L) 03/22/2020 1130   MCV 78 (L) 09/22/2019 0840   MCV 75.4 (L) 11/22/2013 1514   MCH 25.1 (L) 03/22/2020 1130   MCHC 32.3 03/22/2020 1130   RDW 17.5 (H) 03/22/2020 1130   RDW 16.0 (H) 09/22/2019 0840   RDW 16.5 (  H) 11/22/2013 1514   LYMPHSABS 2.6 03/22/2020 1130   LYMPHSABS 3.8 (H) 09/22/2019 0840   LYMPHSABS 4.7 (H) 11/22/2013 1514   MONOABS 1.2 (H) 03/22/2020 1130   MONOABS 0.8 11/22/2013 1514   EOSABS 0.0 03/22/2020 1130   EOSABS 0.2 09/22/2019 0840   BASOSABS 0.1 03/22/2020 1130   BASOSABS 0.1 09/22/2019 0840   BASOSABS 0.1 11/22/2013 1514   -------------------------------  Imaging  from last 24 hours (if applicable):  Radiology interpretation: DG Chest 2 View  Result Date: 03/02/2020 CLINICAL DATA:  Short of breath.  Port placement EXAM: CHEST - 2 VIEW COMPARISON:  02/22/2020 FINDINGS: Interval placement of port in the anterior RIGHT chest wall with tip in the distal SVC. No pneumothorax. Stable cardiac silhouette. Mild LEFT basilar atelectasis. IMPRESSION: No complication following port placement. Electronically Signed   By: Suzy Bouchard M.D.   On: 03/02/2020 12:42      OK to treat today.  Sandi Mealy, MHS, PA-C Physician Asistant

## 2020-03-23 MED ORDER — AVONEX PREFILLED 30 MCG/0.5ML IM PSKT: PREFILLED_SYRINGE | INTRAMUSCULAR | 3 refills | Status: DC

## 2020-03-23 NOTE — Telephone Encounter (Signed)
Received fax from [facility] that pt withdrawn from free drug program for Avonex since insurance now will cover medication. Asked rx be sent to [facility] if we have not already.

## 2020-03-23 NOTE — Addendum Note (Signed)
Addended by: Wyvonnia Lora on: 03/23/2020 01:29 PM   Modules accepted: Orders

## 2020-03-24 ENCOUNTER — Ambulatory Visit: Payer: BC Managed Care – PPO

## 2020-03-27 ENCOUNTER — Ambulatory Visit: Payer: BC Managed Care – PPO | Admitting: Neurology

## 2020-03-27 ENCOUNTER — Encounter: Payer: Self-pay | Admitting: Neurology

## 2020-04-04 ENCOUNTER — Other Ambulatory Visit: Payer: Self-pay | Admitting: Hematology and Oncology

## 2020-04-04 NOTE — Assessment & Plan Note (Signed)
01/10/2020:Palpable right breast mass. Mammogram showed a 1.8cm mass at the 10:30 position 2cm from the nipple, a 1.5cm mass at the 10:30 position 7cm from the nipple, and indeterminate calcifications between the two masses in the right breast. 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 BRCA2Mutation  Treatment plan: 1. Neoadjuvant chemotherapy with Adriamycin and Cytoxan every2weeks4 followed by Taxol weekly 12 with carboplatinand every 3 weeks x4  2. Followed by breast conserving surgery with sentinel lymph node study vsbilateral mastectomies 3. Followed by adjuvant radiation therapyif she undergoes breast conserving surgery 4.Adjuvant olaparib URCC nausea study  Breast MRI12/04/2019: 2 cm spiculated mass UOQ right breast, 2.1 cm spiculated mass UOQ right breast, biopsy-proven malignancies. 7 mm suspicious enhancing mass: Biopsy 02/02/2020: High-grade DCIS ----------------------------------------------------------------------------------------------------------------------------------------------- Current treatment: Cycle 3 neoadjuvant Adriamycin and Cytoxan every2weeks started1/01/2021 Echocardiogram 01/14/2020: EF 55 to 60%  Chemo Toxicities: 1.Fatigue 2.shortness of breath to minimal exertion: Chest x-ray was negative.  She is feeling much better.  Is related to fatigue. 3.Neutropenia: Reduce dose of chemo with cycle 2. 4.Diffuse bone pain after her interferon injection for MS. better after taking Advil on the day after her interferon injection.  RTC in 2 weeks for cycle 4

## 2020-04-04 NOTE — Progress Notes (Signed)
Patient Care Team: Robyne Peers, MD as PCP - General (Family Medicine) Love, Alyson Locket, MD (Neurology) Jerrell Belfast, MD Mauro Kaufmann, RN as Oncology Nurse Navigator Rockwell Germany, RN as Oncology Nurse Navigator Donnie Mesa, MD as Consulting Physician (General Surgery) Nicholas Lose, MD as Consulting Physician (Hematology and Oncology) Kyung Rudd, MD as Consulting Physician (Radiation Oncology)  DIAGNOSIS:    ICD-10-CM   1. Malignant neoplasm of upper-outer quadrant of right breast in female, estrogen receptor negative ([place])  C50.411    Z17.1     SUMMARY OF ONCOLOGIC HISTORY: Oncology History  Malignant neoplasm of upper-outer quadrant of right breast in female, estrogen receptor negative ([place])  01/10/2020 Initial Diagnosis   Palpable right breast mass. Mammogram showed a 1.8cm mass at the 10:30 position 2cm from the nipple, a 1.5cm mass at the 10:30 position 7cm from the nipple, and indeterminate calcifications between the two masses in the right breast. 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    01/12/2020 Cancer Staging   Staging form: Breast, AJCC 8th Edition - Clinical stage from 01/12/2020: Stage IB (cT1c, cN0, cM0, G3, ER-, PR-, HER2-) - Signed by Nicholas Lose, MD on 02/03/2020   02/23/2020 -  Chemotherapy    Patient is on Treatment Plan: BREAST DOSE DENSE AC Q14D / CARBOPLATIN D1 + PACLITAXEL D1,8,15 Q21D        CHIEF COMPLIANT: Cycle 4Day 1AdriamycinCytoxan  INTERVAL HISTORY: Jacqueline Adams is a 60 y.o. with above-mentioned history of BRCA2 mutation and right breast cancer currently on neoadjuvant chemotherapy with Adriamycin and Cytoxan.She presents to the clinic todayfora toxicity check and cycle 4.   ALLERGIES:  is allergic to procaine and relafen [nabumetone].  MEDICATIONS:  Current Outpatient Medications  Medication Sig Dispense Refill  . dexamethasone  (DECADRON) 4 MG tablet Take 1 tablet day before chemo and 1 tablet day after chemo with food 12 tablet 0  . fexofenadine (ALLEGRA) 180 MG tablet Take 180 mg by mouth daily.    . fluticasone (FLONASE) 50 MCG/ACT nasal spray Place 2 sprays into both nostrils daily. 16 g 2  . ibuprofen (ADVIL) 400 MG tablet Take 1 tablet (400 mg total) by mouth every 8 (eight) hours as needed. 90 tablet 5  . interferon beta-1a (AVONEX PREFILLED) 30 MCG/0.5ML PSKT injection INJECT ONE SYRINGE INTRAMUSCULARLY ONCE WEEKLY. REFRIGERATE. PROTECT FROM LIGHT. ALLOW TO WARM TO ROOM TEMPERATURE PRIOR TO USE. 3 each 3  . lidocaine-prilocaine (EMLA) cream Apply to affected area once 30 g 3  . Multiple Vitamins-Minerals (CENTRUM SILVER ADULT 50+ PO) Take by mouth.    . ondansetron (ZOFRAN) 8 MG tablet Take 1 tablet (8 mg total) by mouth 2 (two) times daily as needed. Start on the third day after chemotherapy. 30 tablet 1  . prochlorperazine (COMPAZINE) 10 MG tablet Take 1 tablet (10 mg total) by mouth every 6 (six) hours as needed (Nausea or vomiting). 30 tablet 1  . simvastatin (ZOCOR) 20 MG tablet Take 1 tablet by mouth daily.    [name] topiramate (TOPAMAX) 25 MG tablet Take 25 mg by mouth.    . VENTOLIN HFA 108 (90 BASE) MCG/ACT inhaler Inhale 2 puffs into the lungs every 6 (six) hours as needed.    [name] VITAMIN D PO Take 2 tablets by mouth daily. +calcium     No current facility-administered medications for this visit.    PHYSICAL EXAMINATION: ECOG PERFORMANCE STATUS: 1 -  Symptomatic but completely ambulatory  There were no vitals filed for this visit. There were no vitals filed for this visit.   LABORATORY DATA:  I have reviewed the data as listed CMP Latest Ref Rng & Units 03/22/2020 03/08/2020 03/01/2020  Glucose 70 - 99 mg/dL 113(H) 115(H) 94  BUN 6 - 20 mg/dL 9 6 7   Creatinine 0.44 - 1.00 mg/dL 0.66 0.69 0.64  Sodium 135 - 145 mmol/L 141 142 142  Potassium 3.5 - 5.1 mmol/L 3.8 3.6 3.8  Chloride 98 - 111 mmol/L 108 108  106  CO2 22 - 32 mmol/L 25 26 25   Calcium 8.9 - 10.3 mg/dL 8.9 8.8(L) 8.7(L)  Total Protein 6.5 - 8.1 g/dL 6.8 7.0 6.9  Total Bilirubin 0.3 - 1.2 mg/dL <0.2(L) 0.2(L) 0.3  Alkaline Phos 38 - 126 U/L 125 104 111  AST 15 - 41 U/L 22 20 13(L)  ALT 0 - 44 U/L 18 21 12     Lab Results  Component Value Date   WBC 16.7 (H) 04/05/2020   HGB 10.5 (L) 04/05/2020   HCT 32.2 (L) 04/05/2020   MCV 77.0 (L) 04/05/2020   PLT 223 04/05/2020   NEUTROABS PENDING 04/05/2020    ASSESSMENT & PLAN:  Malignant neoplasm of upper-outer quadrant of right breast in female, estrogen receptor negative ([place]) 01/10/2020:Palpable right breast mass. Mammogram showed a 1.8cm mass at the 10:30 position 2cm from the nipple, a 1.5cm mass at the 10:30 position 7cm from the nipple, and indeterminate calcifications between the two masses in the right breast. 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 BRCA2Mutation  Treatment plan: 1. Neoadjuvant chemotherapy with Adriamycin and Cytoxan every2weeks4 followed by Taxol weekly 12 with carboplatinand every 3 weeks x4  2. Followed by breast conserving surgery with sentinel lymph node study vsbilateral mastectomies 3. Followed by adjuvant radiation therapyif she undergoes breast conserving surgery 4.Adjuvant olaparib URCC nausea study  Breast MRI12/04/2019: 2 cm spiculated mass UOQ right breast, 2.1 cm spiculated mass UOQ right breast, biopsy-proven malignancies. 7 mm suspicious enhancing mass: Biopsy 02/02/2020: High-grade DCIS ----------------------------------------------------------------------------------------------------------------------------------------------- Current treatment: Cycle 3 neoadjuvant Adriamycin and Cytoxan every2weeks started1/01/2021 Echocardiogram 01/14/2020: EF 55 to 60%  Chemo Toxicities: 1.Fatigue 2.shortness of breath to minimal exertion: Chest x-ray  was negative.  She is feeling much better.  Is related to fatigue. 3.Neutropenia: Reduced dose of chemo with cycle 2. 4.Diffuse bone pain after her interferon injection for MS. better after taking Advil on the day after her interferon injection.  RTC in 2 weeks for cycle 4    No orders of the defined types were placed in this encounter.  The patient has a good understanding of the overall plan. she agrees with it. she will call with any problems that may develop before the next visit here.  Total time spent: 30 mins including face to face time and time spent for planning, charting and coordination of care  Rulon Eisenmenger, MD, MPH 04/05/2020  I, Cloyde Reams Dorshimer, am acting as scribe for Dr. Nicholas Lose.  I have reviewed the above documentation for accuracy and completeness, and I agree with the above.

## 2020-04-05 ENCOUNTER — Other Ambulatory Visit: Payer: Self-pay

## 2020-04-05 ENCOUNTER — Inpatient Hospital Stay: Payer: 59

## 2020-04-05 ENCOUNTER — Inpatient Hospital Stay: Payer: 59 | Admitting: Hematology and Oncology

## 2020-04-05 VITALS — BP 131/81 | HR 107 | Temp 98.1°F | Resp 18 | Ht 64.2 in | Wt 218.5 lb

## 2020-04-05 DIAGNOSIS — Z95828 Presence of other vascular implants and grafts: Secondary | ICD-10-CM | POA: Insufficient documentation

## 2020-04-05 DIAGNOSIS — Z5111 Encounter for antineoplastic chemotherapy: Secondary | ICD-10-CM | POA: Diagnosis not present

## 2020-04-05 DIAGNOSIS — Z171 Estrogen receptor negative status [ER-]: Secondary | ICD-10-CM | POA: Diagnosis not present

## 2020-04-05 DIAGNOSIS — R0602 Shortness of breath: Secondary | ICD-10-CM

## 2020-04-05 DIAGNOSIS — C50411 Malignant neoplasm of upper-outer quadrant of right female breast: Secondary | ICD-10-CM

## 2020-04-05 LAB — CBC WITH DIFFERENTIAL (CANCER CENTER ONLY)
Abs Immature Granulocytes: 1.99 10*3/uL — ABNORMAL HIGH (ref 0.00–0.07)
Basophils Absolute: 0.1 10*3/uL (ref 0.0–0.1)
Basophils Relative: 0 %
Eosinophils Absolute: 0.1 10*3/uL (ref 0.0–0.5)
Eosinophils Relative: 1 %
HCT: 32.2 % — ABNORMAL LOW (ref 36.0–46.0)
Hemoglobin: 10.5 g/dL — ABNORMAL LOW (ref 12.0–15.0)
Immature Granulocytes: 12 %
Lymphocytes Relative: 12 %
Lymphs Abs: 2 10*3/uL (ref 0.7–4.0)
MCH: 25.1 pg — ABNORMAL LOW (ref 26.0–34.0)
MCHC: 32.6 g/dL (ref 30.0–36.0)
MCV: 77 fL — ABNORMAL LOW (ref 80.0–100.0)
Monocytes Absolute: 1.3 10*3/uL — ABNORMAL HIGH (ref 0.1–1.0)
Monocytes Relative: 8 %
Neutro Abs: 11.3 10*3/uL — ABNORMAL HIGH (ref 1.7–7.7)
Neutrophils Relative %: 67 %
Platelet Count: 223 10*3/uL (ref 150–400)
RBC: 4.18 MIL/uL (ref 3.87–5.11)
RDW: 18.6 % — ABNORMAL HIGH (ref 11.5–15.5)
WBC Count: 16.7 10*3/uL — ABNORMAL HIGH (ref 4.0–10.5)
nRBC: 0.3 % — ABNORMAL HIGH (ref 0.0–0.2)

## 2020-04-05 LAB — CMP (CANCER CENTER ONLY)
ALT: 17 U/L (ref 0–44)
AST: 21 U/L (ref 15–41)
Albumin: 3.6 g/dL (ref 3.5–5.0)
Alkaline Phosphatase: 113 U/L (ref 38–126)
Anion gap: 7 (ref 5–15)
BUN: 7 mg/dL (ref 6–20)
CO2: 27 mmol/L (ref 22–32)
Calcium: 8.9 mg/dL (ref 8.9–10.3)
Chloride: 108 mmol/L (ref 98–111)
Creatinine: 0.64 mg/dL (ref 0.44–1.00)
GFR, Estimated: >60 mL/min (ref >60)
Glucose, Bld: 96 mg/dL (ref 70–99)
Potassium: 3.5 mmol/L (ref 3.5–5.1)
Sodium: 142 mmol/L (ref 135–145)
Total Bilirubin: 0.2 mg/dL — ABNORMAL LOW (ref 0.3–1.2)
Total Protein: 6.9 g/dL (ref 6.5–8.1)

## 2020-04-05 MED ORDER — SODIUM CHLORIDE 0.9 % IV SOLN
150.0000 mg | Freq: Once | INTRAVENOUS | Status: AC
  Administered 2020-04-05: 150 mg via INTRAVENOUS
  Filled 2020-04-05: qty 150

## 2020-04-05 MED ORDER — SODIUM CHLORIDE 0.9 % IV SOLN
500.0000 mg/m2 | Freq: Once | INTRAVENOUS | Status: AC
  Administered 2020-04-05: 1060 mg via INTRAVENOUS
  Filled 2020-04-05: qty 53

## 2020-04-05 MED ORDER — SODIUM CHLORIDE 0.9 % IV SOLN
10.0000 mg | Freq: Once | INTRAVENOUS | Status: AC
  Administered 2020-04-05: 10 mg via INTRAVENOUS
  Filled 2020-04-05: qty 10

## 2020-04-05 MED ORDER — PALONOSETRON HCL INJECTION 0.25 MG/5ML
0.2500 mg | Freq: Once | INTRAVENOUS | Status: AC
  Administered 2020-04-05: 0.25 mg via INTRAVENOUS

## 2020-04-05 MED ORDER — SODIUM CHLORIDE 0.9 % IV SOLN
Freq: Once | INTRAVENOUS | Status: AC
  Filled 2020-04-05: qty 250

## 2020-04-05 MED ORDER — SODIUM CHLORIDE 0.9% FLUSH
10.0000 mL | INTRAVENOUS | Status: DC | PRN
  Administered 2020-04-05: 10 mL
  Filled 2020-04-05: qty 10

## 2020-04-05 MED ORDER — HEPARIN SOD (PORK) LOCK FLUSH 100 UNIT/ML IV SOLN
500.0000 [IU] | Freq: Once | INTRAVENOUS | Status: AC | PRN
  Administered 2020-04-05: 500 [IU]
  Filled 2020-04-05: qty 5

## 2020-04-05 MED ORDER — DOXORUBICIN HCL CHEMO IV INJECTION 2 MG/ML
50.0000 mg/m2 | Freq: Once | INTRAVENOUS | Status: AC
  Administered 2020-04-05: 106 mg via INTRAVENOUS
  Filled 2020-04-05: qty 53

## 2020-04-05 MED ORDER — SODIUM CHLORIDE 0.9% FLUSH
10.0000 mL | Freq: Once | INTRAVENOUS | Status: AC
  Administered 2020-04-05: 10 mL
  Filled 2020-04-05: qty 10

## 2020-04-05 NOTE — Patient Instructions (Signed)
[facility] Discharge Instructions for Patients Receiving Chemotherapy  Today you received the following chemotherapy agents doxorubicin, cytoxan  To help prevent nausea and vomiting after your treatment, we encourage you to take your nausea medication as directed.   If you develop nausea and vomiting that is not controlled by your nausea medication, call the clinic.   BELOW ARE SYMPTOMS THAT SHOULD BE REPORTED IMMEDIATELY:  *FEVER GREATER THAN 100.5 F  *CHILLS WITH OR WITHOUT FEVER  NAUSEA AND VOMITING THAT IS NOT CONTROLLED WITH YOUR NAUSEA MEDICATION  *UNUSUAL SHORTNESS OF BREATH  *UNUSUAL BRUISING OR BLEEDING  TENDERNESS IN MOUTH AND THROAT WITH OR WITHOUT PRESENCE OF ULCERS  *URINARY PROBLEMS  *BOWEL PROBLEMS  UNUSUAL RASH Items with * indicate a potential emergency and should be followed up as soon as possible.  Feel free to call the clinic should you have any questions or concerns. The clinic phone number is [phone].  Please show the [facility] at check-in to the Emergency Department and triage nurse.

## 2020-04-18 NOTE — Progress Notes (Signed)
Patient Care Team: Robyne Peers, MD as PCP - General (Family Medicine) Love, Alyson Locket, MD (Neurology) Jerrell Belfast, MD Mauro Kaufmann, RN as Oncology Nurse Navigator Rockwell Germany, RN as Oncology Nurse Navigator Donnie Mesa, MD as Consulting Physician (General Surgery) Nicholas Lose, MD as Consulting Physician (Hematology and Oncology) Kyung Rudd, MD as Consulting Physician (Radiation Oncology)  DIAGNOSIS:    ICD-10-CM   1. Malignant neoplasm of upper-outer quadrant of right breast in female, estrogen receptor negative ([place])  C50.411    Z17.1     SUMMARY OF ONCOLOGIC HISTORY: Oncology History  Malignant neoplasm of upper-outer quadrant of right breast in female, estrogen receptor negative ([place])  01/10/2020 Initial Diagnosis   Palpable right breast mass. Mammogram showed a 1.8cm mass at the 10:30 position 2cm from the nipple, a 1.5cm mass at the 10:30 position 7cm from the nipple, and indeterminate calcifications between the two masses in the right breast. 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    01/12/2020 Cancer Staging   Staging form: Breast, AJCC 8th Edition - Clinical stage from 01/12/2020: Stage IB (cT1c, cN0, cM0, G3, ER-, PR-, HER2-) - Signed by Nicholas Lose, MD on 02/03/2020   02/23/2020 -  Chemotherapy    Patient is on Treatment Plan: BREAST DOSE DENSE AC Q14D / CARBOPLATIN D1 + PACLITAXEL D1,8,15 Q21D        CHIEF COMPLIANT: Cycle1Taxol and Carboplatin  INTERVAL HISTORY: Jacqueline Adams is a 60 y.o. with above-mentioned history of BRCA2 mutation and right breast cancer currently on neoadjuvant chemotherapy with weekly Taxol and Carboplatin every 3 weeks.She presents to the clinic todayfora toxicity checkandcycle1.  She continues to experience fatigue but her shortness of breath is much better.  Denies any nausea or vomiting.  ALLERGIES:  is allergic to procaine  and relafen [nabumetone].  MEDICATIONS:  Current Outpatient Medications  Medication Sig Dispense Refill  . dexamethasone (DECADRON) 4 MG tablet Take 1 tablet day before chemo and 1 tablet day after chemo with food 12 tablet 0  . fexofenadine (ALLEGRA) 180 MG tablet Take 180 mg by mouth daily.    . fluticasone (FLONASE) 50 MCG/ACT nasal spray Place 2 sprays into both nostrils daily. 16 g 2  . ibuprofen (ADVIL) 400 MG tablet Take 1 tablet (400 mg total) by mouth every 8 (eight) hours as needed. 90 tablet 5  . interferon beta-1a (AVONEX PREFILLED) 30 MCG/0.5ML PSKT injection INJECT ONE SYRINGE INTRAMUSCULARLY ONCE WEEKLY. REFRIGERATE. PROTECT FROM LIGHT. ALLOW TO WARM TO ROOM TEMPERATURE PRIOR TO USE. 3 each 3  . lidocaine-prilocaine (EMLA) cream Apply to affected area once 30 g 3  . Multiple Vitamins-Minerals (CENTRUM SILVER ADULT 50+ PO) Take by mouth.    . ondansetron (ZOFRAN) 8 MG tablet Take 1 tablet (8 mg total) by mouth 2 (two) times daily as needed. Start on the third day after chemotherapy. 30 tablet 1  . prochlorperazine (COMPAZINE) 10 MG tablet Take 1 tablet (10 mg total) by mouth every 6 (six) hours as needed (Nausea or vomiting). 30 tablet 1  . simvastatin (ZOCOR) 20 MG tablet Take 1 tablet by mouth daily.    [name] topiramate (TOPAMAX) 25 MG tablet Take 25 mg by mouth.    . VENTOLIN HFA 108 (90 BASE) MCG/ACT inhaler Inhale 2 puffs into the lungs every 6 (six) hours as needed.    [name] VITAMIN D PO Take 2 tablets by mouth daily. +calcium  No current facility-administered medications for this visit.    PHYSICAL EXAMINATION: ECOG PERFORMANCE STATUS: 1 - Symptomatic but completely ambulatory  Vitals:   04/19/20 1013  BP: 133/70  Pulse: (!) 110  Resp: 20  Temp: (!) 97.5 F (36.4 C)  SpO2: 98%   Filed Weights   04/19/20 1013  Weight: 219 lb 8 oz (99.6 kg)     LABORATORY DATA:  I have reviewed the data as listed CMP Latest Ref Rng & Units 04/05/2020 03/22/2020 03/08/2020  Glucose  70 - 99 mg/dL 96 113(H) 115(H)  BUN 6 - 20 mg/dL 7 9 6   Creatinine 0.44 - 1.00 mg/dL 0.64 0.66 0.69  Sodium 135 - 145 mmol/L 142 141 142  Potassium 3.5 - 5.1 mmol/L 3.5 3.8 3.6  Chloride 98 - 111 mmol/L 108 108 108  CO2 22 - 32 mmol/L 27 25 26   Calcium 8.9 - 10.3 mg/dL 8.9 8.9 8.8(L)  Total Protein 6.5 - 8.1 g/dL 6.9 6.8 7.0  Total Bilirubin 0.3 - 1.2 mg/dL 0.2(L) <0.2(L) 0.2(L)  Alkaline Phos 38 - 126 U/L 113 125 104  AST 15 - 41 U/L 21 22 20   ALT 0 - 44 U/L 17 18 21     Lab Results  Component Value Date   WBC 20.3 (H) 04/19/2020   HGB 10.2 (L) 04/19/2020   HCT 31.4 (L) 04/19/2020   MCV 77.9 (L) 04/19/2020   PLT 170 04/19/2020   NEUTROABS PENDING 04/19/2020    ASSESSMENT & PLAN:  Malignant neoplasm of upper-outer quadrant of right breast in female, estrogen receptor negative ([place]) 01/10/2020:Palpable right breast mass. Mammogram showed a 1.8cm mass at the 10:30 position 2cm from the nipple, a 1.5cm mass at the 10:30 position 7cm from the nipple, and indeterminate calcifications between the two masses in the right breast. 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 BRCA2Mutation  Treatment plan: 1. Neoadjuvant chemotherapy with Adriamycin and Cytoxan every2weeks4 followed by Taxol weekly 12 with carboplatinand every 3 weeks x4  2. Followed by breast conserving surgery with sentinel lymph node study vsbilateral mastectomies 3. Followed by adjuvant radiation therapyif she undergoes breast conserving surgery 4.Adjuvant olaparib URCC nausea study  Breast MRI12/04/2019: 2 cm spiculated mass UOQ right breast, 2.1 cm spiculated mass UOQ right breast, biopsy-proven malignancies. 7 mm suspicious enhancing mass: Biopsy 02/02/2020: High-grade DCIS ----------------------------------------------------------------------------------------------------------------------------------------------- Current  treatment: Completed 4 cycles ofneoadjuvant Adriamycin and Cytoxan, today cycle 1 Taxol with carboplatin  Echocardiogram 01/14/2020: EF 55 to 60%  Chemo Toxicities: 1.Fatigue 2.shortness of breath to minimal exertion:Chest x-ray was negative. She is feeling much better. Is related to fatigue. 3.Neutropenia:Reduced dose of chemo with cycle 2. 4.Diffuse bone pain after her interferon injection for MS.better after taking Advil on the day after her interferon injection.  RTC weekly for Taxol    No orders of the defined types were placed in this encounter.  The patient has a good understanding of the overall plan. she agrees with it. she will call with any problems that may develop before the next visit here.  Total time spent: 30 mins including face to face time and time spent for planning, charting and coordination of care  Rulon Eisenmenger, MD, MPH 04/19/2020  I, Cloyde Reams Dorshimer, am acting as scribe for Dr. Nicholas Lose.  I have reviewed the above documentation for accuracy and completeness, and I agree with the above.

## 2020-04-19 ENCOUNTER — Inpatient Hospital Stay: Payer: 59

## 2020-04-19 ENCOUNTER — Other Ambulatory Visit: Payer: Self-pay

## 2020-04-19 ENCOUNTER — Inpatient Hospital Stay: Payer: 59 | Attending: Hematology and Oncology

## 2020-04-19 ENCOUNTER — Inpatient Hospital Stay (HOSPITAL_BASED_OUTPATIENT_CLINIC_OR_DEPARTMENT_OTHER): Payer: 59 | Admitting: Hematology and Oncology

## 2020-04-19 VITALS — BP 131/79 | HR 98 | Temp 99.1°F | Resp 16

## 2020-04-19 DIAGNOSIS — Z171 Estrogen receptor negative status [ER-]: Secondary | ICD-10-CM | POA: Diagnosis not present

## 2020-04-19 DIAGNOSIS — C50411 Malignant neoplasm of upper-outer quadrant of right female breast: Secondary | ICD-10-CM | POA: Diagnosis not present

## 2020-04-19 DIAGNOSIS — Z5111 Encounter for antineoplastic chemotherapy: Secondary | ICD-10-CM | POA: Insufficient documentation

## 2020-04-19 DIAGNOSIS — Z95828 Presence of other vascular implants and grafts: Secondary | ICD-10-CM

## 2020-04-19 DIAGNOSIS — Z79899 Other long term (current) drug therapy: Secondary | ICD-10-CM | POA: Diagnosis not present

## 2020-04-19 LAB — CBC WITH DIFFERENTIAL (CANCER CENTER ONLY)
Abs Immature Granulocytes: 2.53 10*3/uL — ABNORMAL HIGH (ref 0.00–0.07)
Basophils Absolute: 0 10*3/uL (ref 0.0–0.1)
Basophils Relative: 0 %
Eosinophils Absolute: 0.1 10*3/uL (ref 0.0–0.5)
Eosinophils Relative: 1 %
HCT: 31.4 % — ABNORMAL LOW (ref 36.0–46.0)
Hemoglobin: 10.2 g/dL — ABNORMAL LOW (ref 12.0–15.0)
Immature Granulocytes: 13 %
Lymphocytes Relative: 10 %
Lymphs Abs: 2.1 10*3/uL (ref 0.7–4.0)
MCH: 25.3 pg — ABNORMAL LOW (ref 26.0–34.0)
MCHC: 32.5 g/dL (ref 30.0–36.0)
MCV: 77.9 fL — ABNORMAL LOW (ref 80.0–100.0)
Monocytes Absolute: 1.3 10*3/uL — ABNORMAL HIGH (ref 0.1–1.0)
Monocytes Relative: 7 %
Neutro Abs: 14.2 10*3/uL — ABNORMAL HIGH (ref 1.7–7.7)
Neutrophils Relative %: 70 %
Platelet Count: 170 10*3/uL (ref 150–400)
RBC: 4.03 MIL/uL (ref 3.87–5.11)
RDW: 20.1 % — ABNORMAL HIGH (ref 11.5–15.5)
WBC Count: 20.3 10*3/uL — ABNORMAL HIGH (ref 4.0–10.5)
nRBC: 0.3 % — ABNORMAL HIGH (ref 0.0–0.2)

## 2020-04-19 LAB — CMP (CANCER CENTER ONLY)
ALT: 20 U/L (ref 0–44)
AST: 21 U/L (ref 15–41)
Albumin: 3.6 g/dL (ref 3.5–5.0)
Alkaline Phosphatase: 139 U/L — ABNORMAL HIGH (ref 38–126)
Anion gap: 9 (ref 5–15)
BUN: 7 mg/dL (ref 6–20)
CO2: 25 mmol/L (ref 22–32)
Calcium: 8.9 mg/dL (ref 8.9–10.3)
Chloride: 108 mmol/L (ref 98–111)
Creatinine: 0.7 mg/dL (ref 0.44–1.00)
GFR, Estimated: >60 mL/min (ref >60)
Glucose, Bld: 109 mg/dL — ABNORMAL HIGH (ref 70–99)
Potassium: 3.7 mmol/L (ref 3.5–5.1)
Sodium: 142 mmol/L (ref 135–145)
Total Bilirubin: <0.2 mg/dL — ABNORMAL LOW (ref 0.3–1.2)
Total Protein: 6.8 g/dL (ref 6.5–8.1)

## 2020-04-19 MED ORDER — FAMOTIDINE IN NACL 20-0.9 MG/50ML-% IV SOLN
20.0000 mg | Freq: Once | INTRAVENOUS | Status: AC
  Administered 2020-04-19: 20 mg via INTRAVENOUS

## 2020-04-19 MED ORDER — PALONOSETRON HCL INJECTION 0.25 MG/5ML
0.2500 mg | Freq: Once | INTRAVENOUS | Status: AC
  Administered 2020-04-19: 0.25 mg via INTRAVENOUS

## 2020-04-19 MED ORDER — SODIUM CHLORIDE 0.9% FLUSH
10.0000 mL | Freq: Once | INTRAVENOUS | Status: AC
  Administered 2020-04-19: 10 mL
  Filled 2020-04-19: qty 10

## 2020-04-19 MED ORDER — SODIUM CHLORIDE 0.9% FLUSH
10.0000 mL | INTRAVENOUS | Status: DC | PRN
  Administered 2020-04-19: 10 mL
  Filled 2020-04-19: qty 10

## 2020-04-19 MED ORDER — SODIUM CHLORIDE 0.9 % IV SOLN
700.0000 mg | Freq: Once | INTRAVENOUS | Status: AC
  Administered 2020-04-19: 700 mg via INTRAVENOUS
  Filled 2020-04-19: qty 70

## 2020-04-19 MED ORDER — SODIUM CHLORIDE 0.9 % IV SOLN
Freq: Once | INTRAVENOUS | Status: AC
  Filled 2020-04-19: qty 250

## 2020-04-19 MED ORDER — DIPHENHYDRAMINE HCL 50 MG/ML IJ SOLN
INTRAMUSCULAR | Status: AC
  Filled 2020-04-19: qty 1

## 2020-04-19 MED ORDER — DIPHENHYDRAMINE HCL 50 MG/ML IJ SOLN
25.0000 mg | Freq: Once | INTRAMUSCULAR | Status: AC
  Administered 2020-04-19: 25 mg via INTRAVENOUS

## 2020-04-19 MED ORDER — FAMOTIDINE IN NACL 20-0.9 MG/50ML-% IV SOLN
INTRAVENOUS | Status: AC
  Filled 2020-04-19: qty 50

## 2020-04-19 MED ORDER — PALONOSETRON HCL INJECTION 0.25 MG/5ML
INTRAVENOUS | Status: AC
  Filled 2020-04-19: qty 5

## 2020-04-19 MED ORDER — HEPARIN SOD (PORK) LOCK FLUSH 100 UNIT/ML IV SOLN
500.0000 [IU] | Freq: Once | INTRAVENOUS | Status: AC | PRN
Start: 2020-04-19 — End: 2020-04-19
  Administered 2020-04-19: 500 [IU]
  Filled 2020-04-19: qty 5

## 2020-04-19 MED ORDER — SODIUM CHLORIDE 0.9 % IV SOLN
150.0000 mg | Freq: Once | INTRAVENOUS | Status: AC
  Administered 2020-04-19: 150 mg via INTRAVENOUS
  Filled 2020-04-19: qty 150

## 2020-04-19 MED ORDER — SODIUM CHLORIDE 0.9 % IV SOLN
80.0000 mg/m2 | Freq: Once | INTRAVENOUS | Status: AC
  Administered 2020-04-19: 168 mg via INTRAVENOUS
  Filled 2020-04-19: qty 28

## 2020-04-19 MED ORDER — SODIUM CHLORIDE 0.9 % IV SOLN
10.0000 mg | Freq: Once | INTRAVENOUS | Status: AC
  Administered 2020-04-19: 10 mg via INTRAVENOUS
  Filled 2020-04-19: qty 10

## 2020-04-19 NOTE — Progress Notes (Signed)
Ok to proceed with treatment with HR per MD.

## 2020-04-19 NOTE — Assessment & Plan Note (Signed)
01/10/2020:Palpable right breast mass. Mammogram showed a 1.8cm mass at the 10:30 position 2cm from the nipple, a 1.5cm mass at the 10:30 position 7cm from the nipple, and indeterminate calcifications between the two masses in the right breast. 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 BRCA2Mutation  Treatment plan: 1. Neoadjuvant chemotherapy with Adriamycin and Cytoxan every2weeks4 followed by Taxol weekly 12 with carboplatinand every 3 weeks x4  2. Followed by breast conserving surgery with sentinel lymph node study vsbilateral mastectomies 3. Followed by adjuvant radiation therapyif she undergoes breast conserving surgery 4.Adjuvant olaparib URCC nausea study  Breast MRI12/04/2019: 2 cm spiculated mass UOQ right breast, 2.1 cm spiculated mass UOQ right breast, biopsy-proven malignancies. 7 mm suspicious enhancing mass: Biopsy 02/02/2020: High-grade DCIS ----------------------------------------------------------------------------------------------------------------------------------------------- Current treatment: Completed 4 cycles ofneoadjuvant Adriamycin and Cytoxan, today cycle 1 Taxol with carboplatin  Echocardiogram 01/14/2020: EF 55 to 60%  Chemo Toxicities: 1.Fatigue 2.shortness of breath to minimal exertion:Chest x-ray was negative. She is feeling much better. Is related to fatigue. 3.Neutropenia:Reduced dose of chemo with cycle 2. 4.Diffuse bone pain after her interferon injection for MS.better after taking Advil on the day after her interferon injection.  RTC weekly for Taxol

## 2020-04-19 NOTE — Patient Instructions (Signed)
Implanted Port Insertion, Care After This sheet gives you information about how to care for yourself after your procedure. Your health care provider may also give you more specific instructions. If you have problems or questions, contact your health care provider. What can I expect after the procedure? After the procedure, it is common to have:  Discomfort at the port insertion site.  Bruising on the skin over the port. This should improve over 3-4 days. Follow these instructions at home: Port care  After your port is placed, you will get a manufacturer's information card. The card has information about your port. Keep this card with you at all times.  Take care of the port as told by your health care provider. Ask your health care provider if you or a family member can get training for taking care of the port at home. A home health care nurse may also take care of the port.  Make sure to remember what type of port you have. Incision care  Follow instructions from your health care provider about how to take care of your port insertion site. Make sure you: ? Wash your hands with soap and water before and after you change your bandage (dressing). If soap and water are not available, use hand sanitizer. ? Change your dressing as told by your health care provider. ? Leave stitches (sutures), skin glue, or adhesive strips in place. These skin closures may need to stay in place for 2 weeks or longer. If adhesive strip edges start to loosen and curl up, you may trim the loose edges. Do not remove adhesive strips completely unless your health care provider tells you to do that.  Check your port insertion site every day for signs of infection. Check for: ? Redness, swelling, or pain. ? Fluid or blood. ? Warmth. ? Pus or a bad smell.      Activity  Return to your normal activities as told by your health care provider. Ask your health care provider what activities are safe for you.  Do not  lift anything that is heavier than 10 lb (4.5 kg), or the limit that you are told, until your health care provider says that it is safe. General instructions  Take over-the-counter and prescription medicines only as told by your health care provider.  Do not take baths, swim, or use a hot tub until your health care provider approves. Ask your health care provider if you may take showers. You may only be allowed to take sponge baths.  Do not drive for 24 hours if you were given a sedative during your procedure.  Wear a medical alert bracelet in case of an emergency. This will tell any health care providers that you have a port.  Keep all follow-up visits as told by your health care provider. This is important. Contact a health care provider if:  You cannot flush your port with saline as directed, or you cannot draw blood from the port.  You have a fever or chills.  You have redness, swelling, or pain around your port insertion site.  You have fluid or blood coming from your port insertion site.  Your port insertion site feels warm to the touch.  You have pus or a bad smell coming from the port insertion site. Get help right away if:  You have chest pain or shortness of breath.  You have bleeding from your port that you cannot control. Summary  Take care of the port as told by your   health care provider. Keep the manufacturer's information card with you at all times.  Change your dressing as told by your health care provider.  Contact a health care provider if you have a fever or chills or if you have redness, swelling, or pain around your port insertion site.  Keep all follow-up visits as told by your health care provider. This information is not intended to replace advice given to you by your health care provider. Make sure you discuss any questions you have with your health care provider. Document Revised: 08/26/2017 Document Reviewed: 08/26/2017 [employer]vier Patient Education   2021 [employer]  

## 2020-04-19 NOTE — Patient Instructions (Signed)
[facility] Discharge Instructions for Patients Receiving Chemotherapy  Today you received the following chemotherapy agents: paclitaxel/carboplatin.  To help prevent nausea and vomiting after your treatment, we encourage you to take your nausea medication as directed.   If you develop nausea and vomiting that is not controlled by your nausea medication, call the clinic.   BELOW ARE SYMPTOMS THAT SHOULD BE REPORTED IMMEDIATELY:  *FEVER GREATER THAN 100.5 F  *CHILLS WITH OR WITHOUT FEVER  NAUSEA AND VOMITING THAT IS NOT CONTROLLED WITH YOUR NAUSEA MEDICATION  *UNUSUAL SHORTNESS OF BREATH  *UNUSUAL BRUISING OR BLEEDING  TENDERNESS IN MOUTH AND THROAT WITH OR WITHOUT PRESENCE OF ULCERS  *URINARY PROBLEMS  *BOWEL PROBLEMS  UNUSUAL RASH Items with * indicate a potential emergency and should be followed up as soon as possible.  Feel free to call the clinic should you have any questions or concerns. The clinic phone number is [phone].  Please show the CHEMO ALERT CARD at check-in to the Emergency Department and triage nurse.  Paclitaxel injection What is this medicine? PACLITAXEL (PAK li TAX el) is a chemotherapy drug. It targets fast dividing cells, like cancer cells, and causes these cells to die. This medicine is used to treat ovarian cancer, breast cancer, lung cancer, Kaposi's sarcoma, and other cancers. This medicine may be used for other purposes; ask your health care provider or pharmacist if you have questions. COMMON BRAND NAME(S): Onxol, Taxol What should I tell my health care provider before I take this medicine? They need to know if you have any of these conditions:  history of irregular heartbeat  liver disease  low blood counts, like low white cell, platelet, or red cell counts  lung or breathing disease, like asthma  tingling of the fingers or toes, or other nerve disorder  an unusual or allergic reaction to paclitaxel, alcohol,  polyoxyethylated castor oil, other chemotherapy, other medicines, foods, dyes, or preservatives  pregnant or trying to get pregnant  breast-feeding How should I use this medicine? This drug is given as an infusion into a vein. It is administered in a hospital or clinic by a specially trained health care professional. Talk to your pediatrician regarding the use of this medicine in children. Special care may be needed. Overdosage: If you think you have taken too much of this medicine contact a poison control center or emergency room at once. NOTE: This medicine is only for you. Do not share this medicine with others. What if I miss a dose? It is important not to miss your dose. Call your doctor or health care professional if you are unable to keep an appointment. What may interact with this medicine? Do not take this medicine with any of the following medications:  live virus vaccines This medicine may also interact with the following medications:  antiviral medicines for hepatitis, HIV or AIDS  certain antibiotics like erythromycin and clarithromycin  certain medicines for fungal infections like ketoconazole and itraconazole  certain medicines for seizures like carbamazepine, phenobarbital, phenytoin  gemfibrozil  nefazodone  rifampin  St. John's wort This list may not describe all possible interactions. Give your health care provider a list of all the medicines, herbs, non-prescription drugs, or dietary supplements you use. Also tell them if you smoke, drink alcohol, or use illegal drugs. Some items may interact with your medicine. What should I watch for while using this medicine? Your condition will be monitored carefully while you are receiving this medicine. You will need important blood work   done while you are taking this medicine. This medicine can cause serious allergic reactions. To reduce your risk you will need to take other medicine(s) before treatment with this  medicine. If you experience allergic reactions like skin rash, itching or hives, swelling of the face, lips, or tongue, tell your doctor or health care professional right away. In some cases, you may be given additional medicines to help with side effects. Follow all directions for their use. This drug may make you feel generally unwell. This is not uncommon, as chemotherapy can affect healthy cells as well as cancer cells. Report any side effects. Continue your course of treatment even though you feel ill unless your doctor tells you to stop. Call your doctor or health care professional for advice if you get a fever, chills or sore throat, or other symptoms of a cold or flu. Do not treat yourself. This drug decreases your body's ability to fight infections. Try to avoid being around people who are sick. This medicine may increase your risk to bruise or bleed. Call your doctor or health care professional if you notice any unusual bleeding. Be careful brushing and flossing your teeth or using a toothpick because you may get an infection or bleed more easily. If you have any dental work done, tell your dentist you are receiving this medicine. Avoid taking products that contain aspirin, acetaminophen, ibuprofen, naproxen, or ketoprofen unless instructed by your doctor. These medicines may hide a fever. Do not become pregnant while taking this medicine. Women should inform their doctor if they wish to become pregnant or think they might be pregnant. There is a potential for serious side effects to an unborn child. Talk to your health care professional or pharmacist for more information. Do not breast-feed an infant while taking this medicine. Men are advised not to father a child while receiving this medicine. This product may contain alcohol. Ask your pharmacist or healthcare provider if this medicine contains alcohol. Be sure to tell all healthcare providers you are taking this medicine. Certain medicines,  like metronidazole and disulfiram, can cause an unpleasant reaction when taken with alcohol. The reaction includes flushing, headache, nausea, vomiting, sweating, and increased thirst. The reaction can last from 30 minutes to several hours. What side effects may I notice from receiving this medicine? Side effects that you should report to your doctor or health care professional as soon as possible:  allergic reactions like skin rash, itching or hives, swelling of the face, lips, or tongue  breathing problems  changes in vision  fast, irregular heartbeat  high or low blood pressure  mouth sores  pain, tingling, numbness in the hands or feet  signs of decreased platelets or bleeding - bruising, pinpoint red spots on the skin, black, tarry stools, blood in the urine  signs of decreased red blood cells - unusually weak or tired, feeling faint or lightheaded, falls  signs of infection - fever or chills, cough, sore throat, pain or difficulty passing urine  signs and symptoms of liver injury like dark yellow or brown urine; general ill feeling or flu-like symptoms; light-colored stools; loss of appetite; nausea; right upper belly pain; unusually weak or tired; yellowing of the eyes or skin  swelling of the ankles, feet, hands  unusually slow heartbeat Side effects that usually do not require medical attention (report to your doctor or health care professional if they continue or are bothersome):  diarrhea  hair loss  loss of appetite  muscle or joint pain    nausea, vomiting  pain, redness, or irritation at site where injected  tiredness This list may not describe all possible side effects. Call your doctor for medical advice about side effects. You may report side effects to FDA at 1-800-FDA-1088. Where should I keep my medicine? This drug is given in a hospital or clinic and will not be stored at home. NOTE: This sheet is a summary. It may not cover all possible information.  If you have questions about this medicine, talk to your doctor, pharmacist, or health care provider.  2021 Elsevier/Gold Standard (2018-12-30 13:37:23)  Carboplatin injection What is this medicine? CARBOPLATIN (KAR boe pla tin) is a chemotherapy drug. It targets fast dividing cells, like cancer cells, and causes these cells to die. This medicine is used to treat ovarian cancer and many other cancers. This medicine may be used for other purposes; ask your health care provider or pharmacist if you have questions. COMMON BRAND NAME(S): Paraplatin What should I tell my health care provider before I take this medicine? They need to know if you have any of these conditions:  blood disorders  hearing problems  kidney disease  recent or ongoing radiation therapy  an unusual or allergic reaction to carboplatin, cisplatin, other chemotherapy, other medicines, foods, dyes, or preservatives  pregnant or trying to get pregnant  breast-feeding How should I use this medicine? This drug is usually given as an infusion into a vein. It is administered in a hospital or clinic by a specially trained health care professional. Talk to your pediatrician regarding the use of this medicine in children. Special care may be needed. Overdosage: If you think you have taken too much of this medicine contact a poison control center or emergency room at once. NOTE: This medicine is only for you. Do not share this medicine with others. What if I miss a dose? It is important not to miss a dose. Call your doctor or health care professional if you are unable to keep an appointment. What may interact with this medicine?  medicines for seizures  medicines to increase blood counts like filgrastim, pegfilgrastim, sargramostim  some antibiotics like amikacin, gentamicin, neomycin, streptomycin, tobramycin  vaccines Talk to your doctor or health care professional before taking any of these  medicines:  acetaminophen  aspirin  ibuprofen  ketoprofen  naproxen This list may not describe all possible interactions. Give your health care provider a list of all the medicines, herbs, non-prescription drugs, or dietary supplements you use. Also tell them if you smoke, drink alcohol, or use illegal drugs. Some items may interact with your medicine. What should I watch for while using this medicine? Your condition will be monitored carefully while you are receiving this medicine. You will need important blood work done while you are taking this medicine. This drug may make you feel generally unwell. This is not uncommon, as chemotherapy can affect healthy cells as well as cancer cells. Report any side effects. Continue your course of treatment even though you feel ill unless your doctor tells you to stop. In some cases, you may be given additional medicines to help with side effects. Follow all directions for their use. Call your doctor or health care professional for advice if you get a fever, chills or sore throat, or other symptoms of a cold or flu. Do not treat yourself. This drug decreases your body's ability to fight infections. Try to avoid being around people who are sick. This medicine may increase your risk to bruise or bleed.   Call your doctor or health care professional if you notice any unusual bleeding. Be careful brushing and flossing your teeth or using a toothpick because you may get an infection or bleed more easily. If you have any dental work done, tell your dentist you are receiving this medicine. Avoid taking products that contain aspirin, acetaminophen, ibuprofen, naproxen, or ketoprofen unless instructed by your doctor. These medicines may hide a fever. Do not become pregnant while taking this medicine. Women should inform their doctor if they wish to become pregnant or think they might be pregnant. There is a potential for serious side effects to an unborn child. Talk  to your health care professional or pharmacist for more information. Do not breast-feed an infant while taking this medicine. What side effects may I notice from receiving this medicine? Side effects that you should report to your doctor or health care professional as soon as possible:  allergic reactions like skin rash, itching or hives, swelling of the face, lips, or tongue  signs of infection - fever or chills, cough, sore throat, pain or difficulty passing urine  signs of decreased platelets or bleeding - bruising, pinpoint red spots on the skin, black, tarry stools, nosebleeds  signs of decreased red blood cells - unusually weak or tired, fainting spells, lightheadedness  breathing problems  changes in hearing  changes in vision  chest pain  high blood pressure  low blood counts - This drug may decrease the number of white blood cells, red blood cells and platelets. You may be at increased risk for infections and bleeding.  nausea and vomiting  pain, swelling, redness or irritation at the injection site  pain, tingling, numbness in the hands or feet  problems with balance, talking, walking  trouble passing urine or change in the amount of urine Side effects that usually do not require medical attention (report to your doctor or health care professional if they continue or are bothersome):  hair loss  loss of appetite  metallic taste in the mouth or changes in taste This list may not describe all possible side effects. Call your doctor for medical advice about side effects. You may report side effects to FDA at 1-800-FDA-1088. Where should I keep my medicine? This drug is given in a hospital or clinic and will not be stored at home. NOTE: This sheet is a summary. It may not cover all possible information. If you have questions about this medicine, talk to your doctor, pharmacist, or health care provider.  2021 Elsevier/Gold Standard (2007-05-05 14:38:05)   

## 2020-04-24 NOTE — Progress Notes (Signed)
CARDIOLOGY CONSULT NOTE       Patient ID: Jacqueline Adams MRN: [id number] DOB/AGE: Sep 05, 1960 60 y.o.  Admit date: (Not on file) Referring Physician: [name]  Primary Physician: Robyne Peers, MD Primary Cardiologist: New Reason for Consultation: Dyspnea  Active Problems:   * No active hospital problems. *   HPI:  60 y.o. referred by Dr [name] Oncology  for dyspnea. History of breast cancer, MS, HLD.  Fairly recent breast cancer diagnosis 01/10/20 Stage 1B Initially Rx with Adriamycin and Cytoxan That's when dyspnea noted and has improved since then  Being Rx Carboplatin and Paclitaxel Since Rx has had fatigue and dyspnea She is on Prednisone and gets interferon injections for MS She had port Placed right chest. CXR 03/02/20 normal   Echo done 01/14/20 EF 55-60% with only relaxation abnormality And GLS -18.1 Normal RV no signs of pulmonary HTN  She did have a cardiopulmonary stress test in 2016 for same symptoms of exertional dyspnea and it was normal limitations from obesity   I take care of her mom Raina Mina who has some CHF and is 11 yo  She has noted increased HR since chemo. She is overweight with MS and some anemia that contribute No chest pain but dyspnea is exertional .    ROS All other systems reviewed and negative except as noted above  Past Medical History:  Diagnosis Date  . Breast cancer ([place]) 01/2020   right breast IDC  . Bronchitis   . Cervical spondylarthritis   . Headache(784.0)   . High cholesterol   . Leukocytosis 2009  . Multiple sclerosis ([place]) 2009   followed by Dr. Erling Cruz  . Rhinitis   . Seasonal allergies   . Sinusitis     Family History  Problem Relation Age of Onset  . Cancer Mother 43       ovarian cancer; now with suspected breast cancer  . Diabetes Mother        also HTN  . Breast cancer Mother   . Clotting disorder Father   . Prostate cancer Father   . Breast cancer Maternal Aunt   . Cancer Maternal Grandmother         unknown female reproductive organ cancer  . Breast cancer Maternal Aunt   . Breast cancer Maternal Aunt   . Colon cancer Sister 78       anal cancer (2014)  . Lung cancer Sister 67  . Stroke Sister 40  . Ovarian cancer Sister 18  . BRCA 1/2 Sister     Social History   Socioeconomic History  . Marital status: Married    Spouse name: [name]  . Number of children: 2  . Years of education: College  . Highest education level: Not on file  Occupational History  . Occupation: Print production planner: [employer]  Tobacco Use  . Smoking status: Never Smoker  . Smokeless tobacco: Never Used  Vaping Use  . Vaping Use: Never used  Substance and Sexual Activity  . Alcohol use: No  . Drug use: No  . Sexual activity: Yes    Birth control/protection: Surgical  Other Topics Concern  . Not on file  Social History Narrative   Patient lives at home with her spouse.   Caffeine Use: 2 sodas daily   Social Determinants of Health   Financial Resource Strain: Low Risk   . Difficulty of Paying Living Expenses: Not hard at all  Food Insecurity: No Food Insecurity  . Worried  About Running Out of Food in the Last Year: Never true  . Ran Out of Food in the Last Year: Never true  Transportation Needs: No Transportation Needs  . Lack of Transportation (Medical): No  . Lack of Transportation (Non-Medical): No  Physical Activity: Not on file  Stress: Not on file  Social Connections: Socially Integrated  . Frequency of Communication with Friends and Family: More than three times a week  . Frequency of Social Gatherings with Friends and Family: Not on file  . Attends Religious Services: More than 4 times per year  . Active Member of Clubs or Organizations: Not on file  . Attends Archivist Meetings: More than 4 times per year  . Marital Status: Married  Human resources officer Violence: Not on file    Past Surgical History:  Procedure Laterality Date  . CESAREAN SECTION     x 3  .  CORRECTION HAMMER TOE Bilateral   . PARTIAL HYSTERECTOMY  2006   for uterine fibroid.   [name] PORTACATH PLACEMENT Right 02/22/2020   Procedure: RIGHT SUBCLAVIAN VEIN PORT PLACEMENT;  Surgeon: Donnie Mesa, MD;  Location: [facility];  Service: General;  Laterality: Right;      Current Outpatient Medications:  .  dexamethasone (DECADRON) 4 MG tablet, Take 1 tablet day before chemo and 1 tablet day after chemo with food, Disp: 12 tablet, Rfl: 0 .  fexofenadine (ALLEGRA) 180 MG tablet, Take 180 mg by mouth daily., Disp: , Rfl:  .  fluticasone (FLONASE) 50 MCG/ACT nasal spray, Place 2 sprays into both nostrils daily., Disp: 16 g, Rfl: 2 .  ibuprofen (ADVIL) 400 MG tablet, Take 1 tablet (400 mg total) by mouth every 8 (eight) hours as needed., Disp: 90 tablet, Rfl: 5 .  interferon beta-1a (AVONEX PREFILLED) 30 MCG/0.5ML PSKT injection, INJECT ONE SYRINGE INTRAMUSCULARLY ONCE WEEKLY. REFRIGERATE. PROTECT FROM LIGHT. ALLOW TO WARM TO ROOM TEMPERATURE PRIOR TO USE., Disp: 3 each, Rfl: 3 .  lidocaine-prilocaine (EMLA) cream, Apply to affected area once, Disp: 30 g, Rfl: 3 .  Multiple Vitamins-Minerals (CENTRUM SILVER ADULT 50+ PO), Take by mouth., Disp: , Rfl:  .  ondansetron (ZOFRAN) 8 MG tablet, Take 1 tablet (8 mg total) by mouth 2 (two) times daily as needed. Start on the third day after chemotherapy., Disp: 30 tablet, Rfl: 1 .  prochlorperazine (COMPAZINE) 10 MG tablet, Take 1 tablet (10 mg total) by mouth every 6 (six) hours as needed (Nausea or vomiting)., Disp: 30 tablet, Rfl: 1 .  simvastatin (ZOCOR) 20 MG tablet, Take 1 tablet by mouth daily., Disp: , Rfl:  .  topiramate (TOPAMAX) 25 MG tablet, Take 25 mg by mouth., Disp: , Rfl:  .  VENTOLIN HFA 108 (90 BASE) MCG/ACT inhaler, Inhale 2 puffs into the lungs every 6 (six) hours as needed., Disp: , Rfl:  .  VITAMIN D PO, Take 2 tablets by mouth daily. +calcium, Disp: , Rfl:     Physical Exam: There were no vitals taken for this  visit.   Affect appropriate Obese female  HEENT: normal Neck supple with no adenopathy JVP normal no bruits no thyromegaly Lungs clear with no wheezing and good diaphragmatic motion Heart:  S1/S2 no murmur, no rub, gallop or click PMI normal Port under right clavicle  Abdomen: benighn, BS positve, no tenderness, no AAA no bruit.  No HSM or HJR Distal pulses intact with no bruits No edema Neuro non-focal Skin warm and dry No muscular weakness   Labs:  Lab Results  Component Value Date   WBC 6.7 05/03/2020   HGB 9.5 (L) 05/03/2020   HCT 29.1 (L) 05/03/2020   MCV 78.9 (L) 05/03/2020   PLT 203 05/03/2020    Recent Labs  Lab 05/03/20 1204  NA 143  K 3.5  CL 107  CO2 26  BUN 5*  CREATININE 0.65  CALCIUM 8.9  PROT 6.7  BILITOT 0.3  ALKPHOS 88  ALT 39  AST 31  GLUCOSE 94   No results found for: CKTOTAL, CKMB, CKMBINDEX, TROPONINI  Lab Results  Component Value Date   CHOL 194 03/25/2019   Lab Results  Component Value Date   HDL 43 03/25/2019   Lab Results  Component Value Date   LDLCALC 133 (H) 03/25/2019   Lab Results  Component Value Date   TRIG 99 03/25/2019   Lab Results  Component Value Date   CHOLHDL 4.5 (H) 03/25/2019   No results found for: LDLDIRECT    Radiology: No results found.  EKG: SR rate 109 normal    ASSESSMENT AND PLAN:   1. Dyspnea:  Seems to be related to chemoRx. Echo 01/14/20 reassuring with normal EF and GLS no RV issues CXR post port placement normal Obesity and some anemia with Hct 31.4 contribute will check BNP and labs today. Repeat echo r/o late effect of chemo Given likely need for lumpectomy or mastectomy would also like to r/o CAD with Lexiscan myovue Is some concern that tachycardia since starting chemo would indicate decreased cardiac stroke volume but f/u GLS/EF should tell us this   2. MS:  Continue interferon and steroids   3. Breast Cancer: neoajuvant chemo ? To have surgery and XRT still  4. HLD:  Continue  statin   Signed: Jenkins Rouge 05/05/2020, 3:32 PM

## 2020-04-25 NOTE — Progress Notes (Signed)
Patient Care Team: Robyne Peers, MD as PCP - General (Family Medicine) Love, Alyson Locket, MD (Neurology) Jerrell Belfast, MD Mauro Kaufmann, RN as Oncology Nurse Navigator Rockwell Germany, RN as Oncology Nurse Navigator Donnie Mesa, MD as Consulting Physician (General Surgery) Nicholas Lose, MD as Consulting Physician (Hematology and Oncology) Kyung Rudd, MD as Consulting Physician (Radiation Oncology)  DIAGNOSIS:    ICD-10-CM   1. Malignant neoplasm of upper-outer quadrant of right breast in female, estrogen receptor negative ([place])  C50.411    Z17.1     SUMMARY OF ONCOLOGIC HISTORY: Oncology History  Malignant neoplasm of upper-outer quadrant of right breast in female, estrogen receptor negative ([place])  01/10/2020 Initial Diagnosis   Palpable right breast mass. Mammogram showed a 1.8cm mass at the 10:30 position 2cm from the nipple, a 1.5cm mass at the 10:30 position 7cm from the nipple, and indeterminate calcifications between the two masses in the right breast. 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    01/12/2020 Cancer Staging   Staging form: Breast, AJCC 8th Edition - Clinical stage from 01/12/2020: Stage IB (cT1c, cN0, cM0, G3, ER-, PR-, HER2-) - Signed by Nicholas Lose, MD on 02/03/2020   02/23/2020 -  Chemotherapy    Patient is on Treatment Plan: BREAST DOSE DENSE AC Q14D / CARBOPLATIN D1 + PACLITAXEL D1,8,15 Q21D        CHIEF COMPLIANT: Cycle2Taxol   INTERVAL HISTORY: Jacqueline Adams is a 60 y.o. with above-mentioned history of BRCA2 mutation and right breast cancer currently on neoadjuvant chemotherapy with weekly Taxol and Carboplatin every 3 weeks.She presents to the clinic todayfora toxicity checkandcycle2. she did well with cycle 1 of Taxol and carboplatin.  She did not have any nausea or vomiting.  She did feel very tired and sleepy for 24 hours.  ALLERGIES:  is  allergic to procaine and relafen [nabumetone].  MEDICATIONS:  Current Outpatient Medications  Medication Sig Dispense Refill   dexamethasone (DECADRON) 4 MG tablet Take 1 tablet day before chemo and 1 tablet day after chemo with food 12 tablet 0   fexofenadine (ALLEGRA) 180 MG tablet Take 180 mg by mouth daily.     fluticasone (FLONASE) 50 MCG/ACT nasal spray Place 2 sprays into both nostrils daily. 16 g 2   ibuprofen (ADVIL) 400 MG tablet Take 1 tablet (400 mg total) by mouth every 8 (eight) hours as needed. 90 tablet 5   interferon beta-1a (AVONEX PREFILLED) 30 MCG/0.5ML PSKT injection INJECT ONE SYRINGE INTRAMUSCULARLY ONCE WEEKLY. REFRIGERATE. PROTECT FROM LIGHT. ALLOW TO WARM TO ROOM TEMPERATURE PRIOR TO USE. 3 each 3   lidocaine-prilocaine (EMLA) cream Apply to affected area once 30 g 3   Multiple Vitamins-Minerals (CENTRUM SILVER ADULT 50+ PO) Take by mouth.     ondansetron (ZOFRAN) 8 MG tablet Take 1 tablet (8 mg total) by mouth 2 (two) times daily as needed. Start on the third day after chemotherapy. 30 tablet 1   prochlorperazine (COMPAZINE) 10 MG tablet Take 1 tablet (10 mg total) by mouth every 6 (six) hours as needed (Nausea or vomiting). 30 tablet 1   simvastatin (ZOCOR) 20 MG tablet Take 1 tablet by mouth daily.     topiramate (TOPAMAX) 25 MG tablet Take 25 mg by mouth.     VENTOLIN HFA 108 (90 BASE) MCG/ACT inhaler Inhale 2 puffs into the lungs every 6 (six) hours as needed.     VITAMIN D  PO Take 2 tablets by mouth daily. +calcium     No current facility-administered medications for this visit.    PHYSICAL EXAMINATION: ECOG PERFORMANCE STATUS: 1 - Symptomatic but completely ambulatory  There were no vitals filed for this visit. There were no vitals filed for this visit.   LABORATORY DATA:  I have reviewed the data as listed CMP Latest Ref Rng & Units 04/19/2020 04/05/2020 03/22/2020  Glucose 70 - 99 mg/dL 109(H) 96 113(H)  BUN 6 - 20 mg/dL 7 7 9   Creatinine  0.44 - 1.00 mg/dL 0.70 0.64 0.66  Sodium 135 - 145 mmol/L 142 142 141  Potassium 3.5 - 5.1 mmol/L 3.7 3.5 3.8  Chloride 98 - 111 mmol/L 108 108 108  CO2 22 - 32 mmol/L 25 27 25   Calcium 8.9 - 10.3 mg/dL 8.9 8.9 8.9  Total Protein 6.5 - 8.1 g/dL 6.8 6.9 6.8  Total Bilirubin 0.3 - 1.2 mg/dL <0.2(L) 0.2(L) <0.2(L)  Alkaline Phos 38 - 126 U/L 139(H) 113 125  AST 15 - 41 U/L 21 21 22   ALT 0 - 44 U/L 20 17 18     Lab Results  Component Value Date   WBC 20.3 (H) 04/19/2020   HGB 10.2 (L) 04/19/2020   HCT 31.4 (L) 04/19/2020   MCV 77.9 (L) 04/19/2020   PLT 170 04/19/2020   NEUTROABS 14.2 (H) 04/19/2020    ASSESSMENT & PLAN:  Malignant neoplasm of upper-outer quadrant of right breast in female, estrogen receptor negative ([place]) 01/10/2020:Palpable right breast mass. Mammogram showed a 1.8cm mass at the 10:30 position 2cm from the nipple, a 1.5cm mass at the 10:30 position 7cm from the nipple, and indeterminate calcifications between the two masses in the right breast. 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 BRCA2Mutation  Treatment plan: 1. Neoadjuvant chemotherapy with Adriamycin and Cytoxan every2weeks4 followed by Taxol weekly 12 with carboplatinand every 3 weeks x4  2. Followed by breast conserving surgery with sentinel lymph node study vsbilateral mastectomies 3. Followed by adjuvant radiation therapyif she undergoes breast conserving surgery 4.Adjuvant olaparib URCC nausea study  Breast MRI12/04/2019: 2 cm spiculated mass UOQ right breast, 2.1 cm spiculated mass UOQ right breast, biopsy-proven malignancies. 7 mm suspicious enhancing mass: Biopsy 02/02/2020: High-grade DCIS ----------------------------------------------------------------------------------------------------------------------------------------------- Current treatment: Completed 4 cycles ofneoadjuvant Adriamycin and Cytoxan,  today cycle 2 Taxol (with carboplatin  every 3 weeks) Echocardiogram 01/14/2020: EF 55 to 60%  Chemo Toxicities: 1.Fatigue 2.shortness of breath to minimal exertion: related to fatigue. 3.Neutropenia:Reduceddose of chemo with cycle 2.   RTC weekly for Taxol and every other week for follow up.     No orders of the defined types were placed in this encounter.  The patient has a good understanding of the overall plan. she agrees with it. she will call with any problems that may develop before the next visit here.  Total time spent: 30 mins including face to face time and time spent for planning, charting and coordination of care  Rulon Eisenmenger, MD, MPH 04/26/2020  I, Molly Dorshimer, am acting as scribe for Dr. Nicholas Lose.  I have reviewed the above documentation for accuracy and completeness, and I agree with the above.

## 2020-04-25 NOTE — Assessment & Plan Note (Signed)
01/10/2020:Palpable right breast mass. Mammogram showed a 1.8cm mass at the 10:30 position 2cm from the nipple, a 1.5cm mass at the 10:30 position 7cm from the nipple, and indeterminate calcifications between the two masses in the right breast. 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 BRCA2Mutation  Treatment plan: 1. Neoadjuvant chemotherapy with Adriamycin and Cytoxan every2weeks4 followed by Taxol weekly 12 with carboplatinand every 3 weeks x4  2. Followed by breast conserving surgery with sentinel lymph node study vsbilateral mastectomies 3. Followed by adjuvant radiation therapyif she undergoes breast conserving surgery 4.Adjuvant olaparib URCC nausea study  Breast MRI12/04/2019: 2 cm spiculated mass UOQ right breast, 2.1 cm spiculated mass UOQ right breast, biopsy-proven malignancies. 7 mm suspicious enhancing mass: Biopsy 02/02/2020: High-grade DCIS ----------------------------------------------------------------------------------------------------------------------------------------------- Current treatment: Completed 4 cycles ofneoadjuvant Adriamycin and Cytoxan, today cycle 2 Taxol with carboplatin  Echocardiogram 01/14/2020: EF 55 to 60%  Chemo Toxicities: 1.Fatigue 2.shortness of breath to minimal exertion:Chest x-ray was negative. She is feeling much better. Is related to fatigue. 3.Neutropenia:Reduceddose of chemo with cycle 2. 4.Diffuse bone pain after her interferon injection for MS.better after taking Advil on the day after her interferon injection.  RTC weekly for Taxol and every other week for follow up.

## 2020-04-26 ENCOUNTER — Encounter: Payer: Self-pay | Admitting: *Deleted

## 2020-04-26 ENCOUNTER — Inpatient Hospital Stay: Payer: 59

## 2020-04-26 ENCOUNTER — Other Ambulatory Visit: Payer: Self-pay

## 2020-04-26 ENCOUNTER — Inpatient Hospital Stay: Payer: 59 | Admitting: Hematology and Oncology

## 2020-04-26 DIAGNOSIS — Z95828 Presence of other vascular implants and grafts: Secondary | ICD-10-CM

## 2020-04-26 DIAGNOSIS — Z171 Estrogen receptor negative status [ER-]: Secondary | ICD-10-CM | POA: Diagnosis not present

## 2020-04-26 DIAGNOSIS — C50411 Malignant neoplasm of upper-outer quadrant of right female breast: Secondary | ICD-10-CM

## 2020-04-26 LAB — CMP (CANCER CENTER ONLY)
ALT: 28 U/L (ref 0–44)
AST: 24 U/L (ref 15–41)
Albumin: 3.5 g/dL (ref 3.5–5.0)
Alkaline Phosphatase: 99 U/L (ref 38–126)
Anion gap: 9 (ref 5–15)
BUN: 7 mg/dL (ref 6–20)
CO2: 26 mmol/L (ref 22–32)
Calcium: 8.9 mg/dL (ref 8.9–10.3)
Chloride: 106 mmol/L (ref 98–111)
Creatinine: 0.61 mg/dL (ref 0.44–1.00)
GFR, Estimated: >60 mL/min (ref >60)
Glucose, Bld: 116 mg/dL — ABNORMAL HIGH (ref 70–99)
Potassium: 3.4 mmol/L — ABNORMAL LOW (ref 3.5–5.1)
Sodium: 141 mmol/L (ref 135–145)
Total Bilirubin: <0.2 mg/dL — ABNORMAL LOW (ref 0.3–1.2)
Total Protein: 6.5 g/dL (ref 6.5–8.1)

## 2020-04-26 LAB — CBC WITH DIFFERENTIAL (CANCER CENTER ONLY)
Abs Immature Granulocytes: 0.04 10*3/uL (ref 0.00–0.07)
Basophils Absolute: 0.1 10*3/uL (ref 0.0–0.1)
Basophils Relative: 1 %
Eosinophils Absolute: 0 10*3/uL (ref 0.0–0.5)
Eosinophils Relative: 0 %
HCT: 28.2 % — ABNORMAL LOW (ref 36.0–46.0)
Hemoglobin: 9.2 g/dL — ABNORMAL LOW (ref 12.0–15.0)
Immature Granulocytes: 1 %
Lymphocytes Relative: 22 %
Lymphs Abs: 1.1 10*3/uL (ref 0.7–4.0)
MCH: 25.1 pg — ABNORMAL LOW (ref 26.0–34.0)
MCHC: 32.6 g/dL (ref 30.0–36.0)
MCV: 77 fL — ABNORMAL LOW (ref 80.0–100.0)
Monocytes Absolute: 0.5 10*3/uL (ref 0.1–1.0)
Monocytes Relative: 9 %
Neutro Abs: 3.2 10*3/uL (ref 1.7–7.7)
Neutrophils Relative %: 67 %
Platelet Count: 245 10*3/uL (ref 150–400)
RBC: 3.66 MIL/uL — ABNORMAL LOW (ref 3.87–5.11)
RDW: 19.5 % — ABNORMAL HIGH (ref 11.5–15.5)
WBC Count: 4.9 10*3/uL (ref 4.0–10.5)
nRBC: 0 % (ref 0.0–0.2)

## 2020-04-26 MED ORDER — SODIUM CHLORIDE 0.9% FLUSH
10.0000 mL | Freq: Once | INTRAVENOUS | Status: AC
  Administered 2020-04-26: 10 mL
  Filled 2020-04-26: qty 10

## 2020-04-26 MED ORDER — SODIUM CHLORIDE 0.9 % IV SOLN
80.0000 mg/m2 | Freq: Once | INTRAVENOUS | Status: AC
  Administered 2020-04-26: 168 mg via INTRAVENOUS
  Filled 2020-04-26: qty 28

## 2020-04-26 MED ORDER — FAMOTIDINE IN NACL 20-0.9 MG/50ML-% IV SOLN
20.0000 mg | Freq: Once | INTRAVENOUS | Status: AC
  Administered 2020-04-26: 20 mg via INTRAVENOUS

## 2020-04-26 MED ORDER — DIPHENHYDRAMINE HCL 50 MG/ML IJ SOLN
INTRAMUSCULAR | Status: AC
  Filled 2020-04-26: qty 1

## 2020-04-26 MED ORDER — FAMOTIDINE IN NACL 20-0.9 MG/50ML-% IV SOLN
INTRAVENOUS | Status: AC
  Filled 2020-04-26: qty 50

## 2020-04-26 MED ORDER — SODIUM CHLORIDE 0.9 % IV SOLN
Freq: Once | INTRAVENOUS | Status: AC
  Filled 2020-04-26: qty 250

## 2020-04-26 MED ORDER — DIPHENHYDRAMINE HCL 50 MG/ML IJ SOLN
25.0000 mg | Freq: Once | INTRAMUSCULAR | Status: AC
  Administered 2020-04-26: 25 mg via INTRAVENOUS

## 2020-04-26 MED ORDER — SODIUM CHLORIDE 0.9% FLUSH
10.0000 mL | INTRAVENOUS | Status: DC | PRN
  Administered 2020-04-26: 10 mL
  Filled 2020-04-26: qty 10

## 2020-04-26 MED ORDER — HEPARIN SOD (PORK) LOCK FLUSH 100 UNIT/ML IV SOLN
500.0000 [IU] | Freq: Once | INTRAVENOUS | Status: AC | PRN
  Administered 2020-04-26: 500 [IU]
  Filled 2020-04-26: qty 5

## 2020-04-26 MED ORDER — SODIUM CHLORIDE 0.9 % IV SOLN
20.0000 mg | Freq: Once | INTRAVENOUS | Status: AC
  Administered 2020-04-26: 20 mg via INTRAVENOUS
  Filled 2020-04-26: qty 20

## 2020-04-26 NOTE — Patient Instructions (Signed)
[facility] Discharge Instructions for Patients Receiving Chemotherapy  Today you received the following chemotherapy agents Paclitaxel  To help prevent nausea and vomiting after your treatment, we encourage you to take your nausea medication as directed   If you develop nausea and vomiting that is not controlled by your nausea medication, call the clinic.   BELOW ARE SYMPTOMS THAT SHOULD BE REPORTED IMMEDIATELY:  *FEVER GREATER THAN 100.5 F  *CHILLS WITH OR WITHOUT FEVER  NAUSEA AND VOMITING THAT IS NOT CONTROLLED WITH YOUR NAUSEA MEDICATION  *UNUSUAL SHORTNESS OF BREATH  *UNUSUAL BRUISING OR BLEEDING  TENDERNESS IN MOUTH AND THROAT WITH OR WITHOUT PRESENCE OF ULCERS  *URINARY PROBLEMS  *BOWEL PROBLEMS  UNUSUAL RASH Items with * indicate a potential emergency and should be followed up as soon as possible.  Feel free to call the clinic should you have any questions or concerns. The clinic phone number is [phone].  Please show the CHEMO ALERT CARD at check-in to the Emergency Department and triage nurse.   

## 2020-04-26 NOTE — Progress Notes (Signed)
Per MD okay to tx with HR 105.

## 2020-04-27 ENCOUNTER — Other Ambulatory Visit: Payer: Self-pay | Admitting: Hematology and Oncology

## 2020-04-27 ENCOUNTER — Telehealth: Payer: Self-pay | Admitting: Hematology and Oncology

## 2020-04-27 NOTE — Telephone Encounter (Signed)
Scheduled per 3/16 los. Pt will receive an updated appt calendar per next visit appt notes

## 2020-05-02 ENCOUNTER — Other Ambulatory Visit: Payer: Self-pay | Admitting: General Surgery

## 2020-05-02 DIAGNOSIS — C50911 Malignant neoplasm of unspecified site of right female breast: Secondary | ICD-10-CM

## 2020-05-03 ENCOUNTER — Other Ambulatory Visit: Payer: Self-pay

## 2020-05-03 ENCOUNTER — Inpatient Hospital Stay: Payer: 59

## 2020-05-03 ENCOUNTER — Encounter: Payer: Self-pay | Admitting: *Deleted

## 2020-05-03 VITALS — BP 131/79 | HR 96 | Temp 98.7°F | Resp 18

## 2020-05-03 DIAGNOSIS — C50411 Malignant neoplasm of upper-outer quadrant of right female breast: Secondary | ICD-10-CM

## 2020-05-03 DIAGNOSIS — Z95828 Presence of other vascular implants and grafts: Secondary | ICD-10-CM

## 2020-05-03 DIAGNOSIS — Z171 Estrogen receptor negative status [ER-]: Secondary | ICD-10-CM

## 2020-05-03 LAB — CBC WITH DIFFERENTIAL (CANCER CENTER ONLY)
Abs Immature Granulocytes: 0.05 10*3/uL (ref 0.00–0.07)
Basophils Absolute: 0 10*3/uL (ref 0.0–0.1)
Basophils Relative: 1 %
Eosinophils Absolute: 0.1 10*3/uL (ref 0.0–0.5)
Eosinophils Relative: 2 %
HCT: 29.1 % — ABNORMAL LOW (ref 36.0–46.0)
Hemoglobin: 9.5 g/dL — ABNORMAL LOW (ref 12.0–15.0)
Immature Granulocytes: 1 %
Lymphocytes Relative: 22 %
Lymphs Abs: 1.4 10*3/uL (ref 0.7–4.0)
MCH: 25.7 pg — ABNORMAL LOW (ref 26.0–34.0)
MCHC: 32.6 g/dL (ref 30.0–36.0)
MCV: 78.9 fL — ABNORMAL LOW (ref 80.0–100.0)
Monocytes Absolute: 1 10*3/uL (ref 0.1–1.0)
Monocytes Relative: 15 %
Neutro Abs: 4.1 10*3/uL (ref 1.7–7.7)
Neutrophils Relative %: 59 %
Platelet Count: 203 10*3/uL (ref 150–400)
RBC: 3.69 MIL/uL — ABNORMAL LOW (ref 3.87–5.11)
RDW: 20.9 % — ABNORMAL HIGH (ref 11.5–15.5)
WBC Count: 6.7 10*3/uL (ref 4.0–10.5)
nRBC: 0 % (ref 0.0–0.2)

## 2020-05-03 LAB — CMP (CANCER CENTER ONLY)
ALT: 39 U/L (ref 0–44)
AST: 31 U/L (ref 15–41)
Albumin: 3.5 g/dL (ref 3.5–5.0)
Alkaline Phosphatase: 88 U/L (ref 38–126)
Anion gap: 10 (ref 5–15)
BUN: 5 mg/dL — ABNORMAL LOW (ref 6–20)
CO2: 26 mmol/L (ref 22–32)
Calcium: 8.9 mg/dL (ref 8.9–10.3)
Chloride: 107 mmol/L (ref 98–111)
Creatinine: 0.65 mg/dL (ref 0.44–1.00)
GFR, Estimated: >60 mL/min (ref >60)
Glucose, Bld: 94 mg/dL (ref 70–99)
Potassium: 3.5 mmol/L (ref 3.5–5.1)
Sodium: 143 mmol/L (ref 135–145)
Total Bilirubin: 0.3 mg/dL (ref 0.3–1.2)
Total Protein: 6.7 g/dL (ref 6.5–8.1)

## 2020-05-03 MED ORDER — FAMOTIDINE IN NACL 20-0.9 MG/50ML-% IV SOLN
20.0000 mg | Freq: Once | INTRAVENOUS | Status: AC
  Administered 2020-05-03: 20 mg via INTRAVENOUS

## 2020-05-03 MED ORDER — PACLITAXEL CHEMO INJECTION 300 MG/50ML
80.0000 mg/m2 | Freq: Once | INTRAVENOUS | Status: AC
  Administered 2020-05-03: 168 mg via INTRAVENOUS
  Filled 2020-05-03: qty 28

## 2020-05-03 MED ORDER — FAMOTIDINE IN NACL 20-0.9 MG/50ML-% IV SOLN
INTRAVENOUS | Status: AC
  Filled 2020-05-03: qty 50

## 2020-05-03 MED ORDER — SODIUM CHLORIDE 0.9% FLUSH
10.0000 mL | INTRAVENOUS | Status: DC | PRN
  Administered 2020-05-03: 10 mL
  Filled 2020-05-03: qty 10

## 2020-05-03 MED ORDER — DIPHENHYDRAMINE HCL 50 MG/ML IJ SOLN
INTRAMUSCULAR | Status: AC
  Filled 2020-05-03: qty 1

## 2020-05-03 MED ORDER — SODIUM CHLORIDE 0.9 % IV SOLN
Freq: Once | INTRAVENOUS | Status: AC
  Filled 2020-05-03: qty 250

## 2020-05-03 MED ORDER — DEXAMETHASONE SODIUM PHOSPHATE 100 MG/10ML IJ SOLN
20.0000 mg | Freq: Once | INTRAMUSCULAR | Status: AC
  Administered 2020-05-03: 20 mg via INTRAVENOUS
  Filled 2020-05-03: qty 20

## 2020-05-03 MED ORDER — DIPHENHYDRAMINE HCL 50 MG/ML IJ SOLN
25.0000 mg | Freq: Once | INTRAMUSCULAR | Status: AC
  Administered 2020-05-03: 25 mg via INTRAVENOUS

## 2020-05-03 MED ORDER — SODIUM CHLORIDE 0.9% FLUSH
10.0000 mL | Freq: Once | INTRAVENOUS | Status: AC
  Administered 2020-05-03: 10 mL
  Filled 2020-05-03: qty 10

## 2020-05-03 MED ORDER — HEPARIN SOD (PORK) LOCK FLUSH 100 UNIT/ML IV SOLN
500.0000 [IU] | Freq: Once | INTRAVENOUS | Status: AC | PRN
  Administered 2020-05-03: 500 [IU]
  Filled 2020-05-03: qty 5

## 2020-05-03 NOTE — Patient Instructions (Signed)
Implanted Port Insertion, Care After This sheet gives you information about how to care for yourself after your procedure. Your health care provider may also give you more specific instructions. If you have problems or questions, contact your health care provider. What can I expect after the procedure? After the procedure, it is common to have:  Discomfort at the port insertion site.  Bruising on the skin over the port. This should improve over 3-4 days. Follow these instructions at home: Port care  After your port is placed, you will get a manufacturer's information card. The card has information about your port. Keep this card with you at all times.  Take care of the port as told by your health care provider. Ask your health care provider if you or a family member can get training for taking care of the port at home. A home health care nurse may also take care of the port.  Make sure to remember what type of port you have. Incision care  Follow instructions from your health care provider about how to take care of your port insertion site. Make sure you: ? Wash your hands with soap and water before and after you change your bandage (dressing). If soap and water are not available, use hand sanitizer. ? Change your dressing as told by your health care provider. ? Leave stitches (sutures), skin glue, or adhesive strips in place. These skin closures may need to stay in place for 2 weeks or longer. If adhesive strip edges start to loosen and curl up, you may trim the loose edges. Do not remove adhesive strips completely unless your health care provider tells you to do that.  Check your port insertion site every day for signs of infection. Check for: ? Redness, swelling, or pain. ? Fluid or blood. ? Warmth. ? Pus or a bad smell.      Activity  Return to your normal activities as told by your health care provider. Ask your health care provider what activities are safe for you.  Do not  lift anything that is heavier than 10 lb (4.5 kg), or the limit that you are told, until your health care provider says that it is safe. General instructions  Take over-the-counter and prescription medicines only as told by your health care provider.  Do not take baths, swim, or use a hot tub until your health care provider approves. Ask your health care provider if you may take showers. You may only be allowed to take sponge baths.  Do not drive for 24 hours if you were given a sedative during your procedure.  Wear a medical alert bracelet in case of an emergency. This will tell any health care providers that you have a port.  Keep all follow-up visits as told by your health care provider. This is important. Contact a health care provider if:  You cannot flush your port with saline as directed, or you cannot draw blood from the port.  You have a fever or chills.  You have redness, swelling, or pain around your port insertion site.  You have fluid or blood coming from your port insertion site.  Your port insertion site feels warm to the touch.  You have pus or a bad smell coming from the port insertion site. Get help right away if:  You have chest pain or shortness of breath.  You have bleeding from your port that you cannot control. Summary  Take care of the port as told by your   health care provider. Keep the manufacturer's information card with you at all times.  Change your dressing as told by your health care provider.  Contact a health care provider if you have a fever or chills or if you have redness, swelling, or pain around your port insertion site.  Keep all follow-up visits as told by your health care provider. This information is not intended to replace advice given to you by your health care provider. Make sure you discuss any questions you have with your health care provider. Document Revised: 08/26/2017 Document Reviewed: 08/26/2017 [employer]vier Patient Education   2021 [employer]  

## 2020-05-03 NOTE — Patient Instructions (Signed)
[facility] Discharge Instructions for Patients Receiving Chemotherapy  Today you received the following chemotherapy agents:  Taxol.  To help prevent nausea and vomiting after your treatment, we encourage you to take your nausea medication as directed.   If you develop nausea and vomiting that is not controlled by your nausea medication, call the clinic.   BELOW ARE SYMPTOMS THAT SHOULD BE REPORTED IMMEDIATELY:  *FEVER GREATER THAN 100.5 F  *CHILLS WITH OR WITHOUT FEVER  NAUSEA AND VOMITING THAT IS NOT CONTROLLED WITH YOUR NAUSEA MEDICATION  *UNUSUAL SHORTNESS OF BREATH  *UNUSUAL BRUISING OR BLEEDING  TENDERNESS IN MOUTH AND THROAT WITH OR WITHOUT PRESENCE OF ULCERS  *URINARY PROBLEMS  *BOWEL PROBLEMS  UNUSUAL RASH Items with * indicate a potential emergency and should be followed up as soon as possible.  Feel free to call the clinic should you have any questions or concerns. The clinic phone number is [phone].  Please show the CHEMO ALERT CARD at check-in to the Emergency Department and triage nurse.   

## 2020-05-05 ENCOUNTER — Ambulatory Visit: Payer: 59 | Admitting: Cardiovascular Disease

## 2020-05-05 ENCOUNTER — Encounter: Payer: Self-pay | Admitting: Cardiovascular Disease

## 2020-05-05 ENCOUNTER — Other Ambulatory Visit: Payer: Self-pay

## 2020-05-05 VITALS — BP 120/72 | HR 109 | Ht 64.0 in | Wt 222.0 lb

## 2020-05-05 DIAGNOSIS — D649 Anemia, unspecified: Secondary | ICD-10-CM | POA: Diagnosis not present

## 2020-05-05 DIAGNOSIS — R0609 Other forms of dyspnea: Secondary | ICD-10-CM

## 2020-05-05 DIAGNOSIS — R000 Tachycardia, unspecified: Secondary | ICD-10-CM

## 2020-05-05 DIAGNOSIS — R0600 Dyspnea, unspecified: Secondary | ICD-10-CM

## 2020-05-05 DIAGNOSIS — E782 Mixed hyperlipidemia: Secondary | ICD-10-CM

## 2020-05-05 NOTE — Patient Instructions (Signed)
Medication Instructions:  *If you need a refill on your cardiac medications before your next appointment, please call your pharmacy*  Lab Work: Your physician recommends that you have lab work today. Hct , BNP, BMET  If you have labs (blood work) drawn today and your tests are completely normal, you will receive your results only by: [name] MyChart Message (if you have MyChart) OR . A paper copy in the mail If you have any lab test that is abnormal or we need to change your treatment, we will call you to review the results.   Testing/Procedures: Your physician has requested that you have an echocardiogram. Echocardiography is a painless test that uses sound waves to create images of your heart. It provides your doctor with information about the size and shape of your heart and how well your heart's chambers and valves are working. This procedure takes approximately one hour. There are no restrictions for this procedure.  Your physician has requested that you have a lexiscan myoview. For further information please visit [email or web address]. Please follow instruction sheet, as given.  Follow-Up: At [facility], you and your health needs are our priority.  As part of our continuing mission to provide you with exceptional heart care, we have created designated Provider Care Teams.  These Care Teams include your primary Cardiologist (physician) and Advanced Practice Providers (APPs -  Physician Assistants and Nurse Practitioners) who all work together to provide you with the care you need, when you need it.  We recommend signing up for the patient portal called "MyChart".  Sign up information is provided on this After Visit Summary.  MyChart is used to connect with patients for Virtual Visits (Telemedicine).  Patients are able to view lab/test results, encounter notes, upcoming appointments, etc.  Non-urgent messages can be sent to your provider as well.   To learn more about what you can do with  MyChart, go to [email or web address].    Your next appointment:   6 week(s)  The format for your next appointment:   In Person  Provider:   You may see Dr. Johnsie Cancel or one of the following Advanced Practice Providers on your designated Care Team:    Kathyrn Drown, NP

## 2020-05-06 LAB — BASIC METABOLIC PANEL
BUN/Creatinine Ratio: 15 (ref 9–23)
BUN: 8 mg/dL (ref 6–24)
CO2: 23 mmol/L (ref 20–29)
Calcium: 9.2 mg/dL (ref 8.7–10.2)
Chloride: 102 mmol/L (ref 96–106)
Creatinine, Ser: 0.55 mg/dL — ABNORMAL LOW (ref 0.57–1.00)
Glucose: 89 mg/dL (ref 65–99)
Potassium: 3.3 mmol/L — ABNORMAL LOW (ref 3.5–5.2)
Sodium: 142 mmol/L (ref 134–144)
eGFR: 107 mL/min/{1.73_m2} (ref >59)

## 2020-05-06 LAB — HEMATOCRIT: Hematocrit: 30 % — ABNORMAL LOW (ref 34.0–46.6)

## 2020-05-06 LAB — PRO B NATRIURETIC PEPTIDE: NT-Pro BNP: 31 pg/mL (ref 0–287)

## 2020-05-11 ENCOUNTER — Inpatient Hospital Stay: Payer: 59 | Admitting: Medical

## 2020-05-11 ENCOUNTER — Inpatient Hospital Stay: Payer: 59

## 2020-05-11 ENCOUNTER — Other Ambulatory Visit: Payer: Self-pay

## 2020-05-11 VITALS — HR 108

## 2020-05-11 VITALS — BP 145/82 | HR 121 | Temp 97.6°F | Resp 18 | Ht 64.0 in | Wt 220.1 lb

## 2020-05-11 DIAGNOSIS — C50411 Malignant neoplasm of upper-outer quadrant of right female breast: Secondary | ICD-10-CM

## 2020-05-11 DIAGNOSIS — Z171 Estrogen receptor negative status [ER-]: Secondary | ICD-10-CM

## 2020-05-11 DIAGNOSIS — Z95828 Presence of other vascular implants and grafts: Secondary | ICD-10-CM

## 2020-05-11 LAB — CMP (CANCER CENTER ONLY)
ALT: 32 U/L (ref 0–44)
AST: 26 U/L (ref 15–41)
Albumin: 3.5 g/dL (ref 3.5–5.0)
Alkaline Phosphatase: 90 U/L (ref 38–126)
Anion gap: 12 (ref 5–15)
BUN: 6 mg/dL (ref 6–20)
CO2: 27 mmol/L (ref 22–32)
Calcium: 8.8 mg/dL — ABNORMAL LOW (ref 8.9–10.3)
Chloride: 105 mmol/L (ref 98–111)
Creatinine: 0.65 mg/dL (ref 0.44–1.00)
GFR, Estimated: >60 mL/min (ref >60)
Glucose, Bld: 93 mg/dL (ref 70–99)
Potassium: 3.4 mmol/L — ABNORMAL LOW (ref 3.5–5.1)
Sodium: 144 mmol/L (ref 135–145)
Total Bilirubin: 0.3 mg/dL (ref 0.3–1.2)
Total Protein: 6.8 g/dL (ref 6.5–8.1)

## 2020-05-11 LAB — CBC WITH DIFFERENTIAL (CANCER CENTER ONLY)
Abs Immature Granulocytes: 0.05 10*3/uL (ref 0.00–0.07)
Basophils Absolute: 0 10*3/uL (ref 0.0–0.1)
Basophils Relative: 1 %
Eosinophils Absolute: 0.2 10*3/uL (ref 0.0–0.5)
Eosinophils Relative: 2 %
HCT: 30.1 % — ABNORMAL LOW (ref 36.0–46.0)
Hemoglobin: 10.1 g/dL — ABNORMAL LOW (ref 12.0–15.0)
Immature Granulocytes: 1 %
Lymphocytes Relative: 17 %
Lymphs Abs: 1.5 10*3/uL (ref 0.7–4.0)
MCH: 26.4 pg (ref 26.0–34.0)
MCHC: 33.6 g/dL (ref 30.0–36.0)
MCV: 78.8 fL — ABNORMAL LOW (ref 80.0–100.0)
Monocytes Absolute: 0.7 10*3/uL (ref 0.1–1.0)
Monocytes Relative: 8 %
Neutro Abs: 6.4 10*3/uL (ref 1.7–7.7)
Neutrophils Relative %: 71 %
Platelet Count: 259 10*3/uL (ref 150–400)
RBC: 3.82 MIL/uL — ABNORMAL LOW (ref 3.87–5.11)
RDW: 20.7 % — ABNORMAL HIGH (ref 11.5–15.5)
WBC Count: 8.9 10*3/uL (ref 4.0–10.5)
nRBC: 0 % (ref 0.0–0.2)

## 2020-05-11 MED ORDER — SODIUM CHLORIDE 0.9 % IV SOLN
150.0000 mg | Freq: Once | INTRAVENOUS | Status: AC
  Administered 2020-05-11: 150 mg via INTRAVENOUS
  Filled 2020-05-11: qty 150

## 2020-05-11 MED ORDER — SODIUM CHLORIDE 0.9% FLUSH
10.0000 mL | Freq: Once | INTRAVENOUS | Status: AC
  Administered 2020-05-11: 10 mL
  Filled 2020-05-11: qty 10

## 2020-05-11 MED ORDER — SODIUM CHLORIDE 0.9 % IV SOLN
80.0000 mg/m2 | Freq: Once | INTRAVENOUS | Status: AC
  Administered 2020-05-11: 168 mg via INTRAVENOUS
  Filled 2020-05-11: qty 28

## 2020-05-11 MED ORDER — FAMOTIDINE IN NACL 20-0.9 MG/50ML-% IV SOLN
INTRAVENOUS | Status: AC
  Filled 2020-05-11: qty 50

## 2020-05-11 MED ORDER — DIPHENHYDRAMINE HCL 50 MG/ML IJ SOLN
25.0000 mg | Freq: Once | INTRAMUSCULAR | Status: AC
  Administered 2020-05-11: 25 mg via INTRAVENOUS

## 2020-05-11 MED ORDER — FAMOTIDINE IN NACL 20-0.9 MG/50ML-% IV SOLN
20.0000 mg | Freq: Once | INTRAVENOUS | Status: AC
  Administered 2020-05-11: 20 mg via INTRAVENOUS

## 2020-05-11 MED ORDER — PALONOSETRON HCL INJECTION 0.25 MG/5ML
0.2500 mg | Freq: Once | INTRAVENOUS | Status: AC
  Administered 2020-05-11: 0.25 mg via INTRAVENOUS

## 2020-05-11 MED ORDER — DIPHENHYDRAMINE HCL 50 MG/ML IJ SOLN
INTRAMUSCULAR | Status: AC
  Filled 2020-05-11: qty 1

## 2020-05-11 MED ORDER — SODIUM CHLORIDE 0.9 % IV SOLN
10.0000 mg | Freq: Once | INTRAVENOUS | Status: AC
  Administered 2020-05-11: 10 mg via INTRAVENOUS
  Filled 2020-05-11: qty 10

## 2020-05-11 MED ORDER — SODIUM CHLORIDE 0.9 % IV SOLN
Freq: Once | INTRAVENOUS | Status: AC
  Filled 2020-05-11: qty 250

## 2020-05-11 MED ORDER — HEPARIN SOD (PORK) LOCK FLUSH 100 UNIT/ML IV SOLN
500.0000 [IU] | Freq: Once | INTRAVENOUS | Status: AC | PRN
  Administered 2020-05-11: 500 [IU]
  Filled 2020-05-11: qty 5

## 2020-05-11 MED ORDER — CARBOPLATIN CHEMO INJECTION 600 MG/60ML
700.0000 mg | Freq: Once | INTRAVENOUS | Status: AC
Start: 2020-05-11 — End: 2020-05-11
  Administered 2020-05-11: 700 mg via INTRAVENOUS
  Filled 2020-05-11: qty 70

## 2020-05-11 MED ORDER — SODIUM CHLORIDE 0.9% FLUSH
10.0000 mL | INTRAVENOUS | Status: DC | PRN
  Administered 2020-05-11: 10 mL
  Filled 2020-05-11: qty 10

## 2020-05-11 MED ORDER — PALONOSETRON HCL INJECTION 0.25 MG/5ML
INTRAVENOUS | Status: AC
  Filled 2020-05-11: qty 5

## 2020-05-11 NOTE — Patient Instructions (Signed)
[place] Discharge Instructions for Patients Receiving Chemotherapy  Today you received the following chemotherapy agents Paclitaxel(Taxol), Carboplatin,  To help prevent nausea and vomiting after your treatment, we encourage you to take your nausea medication as directed.   If you develop nausea and vomiting that is not controlled by your nausea medication, call the clinic.   BELOW ARE SYMPTOMS THAT SHOULD BE REPORTED IMMEDIATELY:  *FEVER GREATER THAN 100.5 F  *CHILLS WITH OR WITHOUT FEVER  NAUSEA AND VOMITING THAT IS NOT CONTROLLED WITH YOUR NAUSEA MEDICATION  *UNUSUAL SHORTNESS OF BREATH  *UNUSUAL BRUISING OR BLEEDING  TENDERNESS IN MOUTH AND THROAT WITH OR WITHOUT PRESENCE OF ULCERS  *URINARY PROBLEMS  *BOWEL PROBLEMS  UNUSUAL RASH Items with * indicate a potential emergency and should be followed up as soon as possible.  Feel free to call the clinic should you have any questions or concerns. The clinic phone number is [phone].  Please show the [place] at check-in to the Emergency Department and triage nurse.

## 2020-05-11 NOTE — Patient Instructions (Signed)
Implanted Port Insertion, Care After This sheet gives you information about how to care for yourself after your procedure. Your health care provider may also give you more specific instructions. If you have problems or questions, contact your health care provider. What can I expect after the procedure? After the procedure, it is common to have:  Discomfort at the port insertion site.  Bruising on the skin over the port. This should improve over 3-4 days. Follow these instructions at home: Port care  After your port is placed, you will get a manufacturer's information card. The card has information about your port. Keep this card with you at all times.  Take care of the port as told by your health care provider. Ask your health care provider if you or a family member can get training for taking care of the port at home. A home health care nurse may also take care of the port.  Make sure to remember what type of port you have. Incision care  Follow instructions from your health care provider about how to take care of your port insertion site. Make sure you: ? Wash your hands with soap and water before and after you change your bandage (dressing). If soap and water are not available, use hand sanitizer. ? Change your dressing as told by your health care provider. ? Leave stitches (sutures), skin glue, or adhesive strips in place. These skin closures may need to stay in place for 2 weeks or longer. If adhesive strip edges start to loosen and curl up, you may trim the loose edges. Do not remove adhesive strips completely unless your health care provider tells you to do that.  Check your port insertion site every day for signs of infection. Check for: ? Redness, swelling, or pain. ? Fluid or blood. ? Warmth. ? Pus or a bad smell.      Activity  Return to your normal activities as told by your health care provider. Ask your health care provider what activities are safe for you.  Do not  lift anything that is heavier than 10 lb (4.5 kg), or the limit that you are told, until your health care provider says that it is safe. General instructions  Take over-the-counter and prescription medicines only as told by your health care provider.  Do not take baths, swim, or use a hot tub until your health care provider approves. Ask your health care provider if you may take showers. You may only be allowed to take sponge baths.  Do not drive for 24 hours if you were given a sedative during your procedure.  Wear a medical alert bracelet in case of an emergency. This will tell any health care providers that you have a port.  Keep all follow-up visits as told by your health care provider. This is important. Contact a health care provider if:  You cannot flush your port with saline as directed, or you cannot draw blood from the port.  You have a fever or chills.  You have redness, swelling, or pain around your port insertion site.  You have fluid or blood coming from your port insertion site.  Your port insertion site feels warm to the touch.  You have pus or a bad smell coming from the port insertion site. Get help right away if:  You have chest pain or shortness of breath.  You have bleeding from your port that you cannot control. Summary  Take care of the port as told by your   health care provider. Keep the manufacturer's information card with you at all times.  Change your dressing as told by your health care provider.  Contact a health care provider if you have a fever or chills or if you have redness, swelling, or pain around your port insertion site.  Keep all follow-up visits as told by your health care provider. This information is not intended to replace advice given to you by your health care provider. Make sure you discuss any questions you have with your health care provider. Document Revised: 08/26/2017 Document Reviewed: 08/26/2017 [employer]vier Patient Education   2021 [employer]  

## 2020-05-11 NOTE — Progress Notes (Signed)
HR 108, PA states okay to treat

## 2020-05-11 NOTE — Progress Notes (Signed)
OK to treat pending chemistry panel.  Aaradhya Kysar, MHS, PA-C Physician Assistant 

## 2020-05-12 ENCOUNTER — Ambulatory Visit: Payer: 59 | Admitting: Cardiovascular Disease

## 2020-05-16 NOTE — Progress Notes (Signed)
[facility]pt[facility] Management Clinic Progress Note   Jacqueline Adams [id number] 1960/09/06 33 y.oHortense Adams is managed by Dr. Nicholas Lose  Actively treated with chemotherapy/immunotherapy/hormonal therapy: yes  Current therapy: Carboplatin and paclitaxel  Last treated: 05/03/2020 (cycle 5, day 15)  Next scheduled appointment with provider: 05/17/2020  Assessment: Plan:    No diagnosis found.   Jacqueline Adams presents to the office today for consideration of cycle 6, day 1 of carboplatin and paclitaxel.  We will proceed with her treatment today and have her return for her next treatment on 05/17/2020.  Please see After Visit Summary for Jacqueline specific instructions.  Future Appointments  Date Time Provider [place]  05/17/2020 12:45 PM [facility] LAB [facility] None  05/17/2020  1:00 PM [facility] [place] FLUSH [facility] None  05/17/2020  2:00 PM [facility] INFUSION [facility] None  05/24/2020 11:30 AM [facility] LAB [facility] None  05/24/2020 11:45 AM [facility] [place] FLUSH [facility] None  05/24/2020 12:15 PM Nicholas Lose, MD [facility] None  05/24/2020  1:15 PM [facility] INFUSION [facility] None  05/27/2020 12:10 PM GI-315 MR 1 GI-315MRI GI-[facility] [address]  06/01/2020  7:15 AM [facility]CV CH NM2/TREAD [facility]ST3NUCMED [facility]  06/01/2020  7:35 AM [facility]-CV CH ECHO 5 MC-SITE3ECHO [facility]  06/16/2020  8:00 AM Josue Hector, MD CVD-CHUSTOFF [facility]  07/27/2020  3:30 PM Sater, Nanine Means, MD [facility]NA-GNA None    No orders of the defined types were placed in this encounter.      Subjective:   Jacqueline ID:  Jacqueline Adams is a 60 y.o. (DOB [birth year]) female.  Chief Complaint: No chief complaint on file.      Jacqueline Adams  is a 60 y.o. female with a diagnosis of an ER negative malignant neoplasm of the right breast.  She is followed by Dr. Nicholas Lose and presents to the office today for consideration of cycle 6, day 1 of carboplatin and paclitaxel.  She  continues to do exceptionally well with her treatment and denies any issues of concern.  She denies fevers, chills, sweats, nausea, vomiting, constipation, or diarrhea.  Medications: I have reviewed the Jacqueline's current medications.  Allergies:  Allergies  Allergen Reactions  . Procaine Swelling  . Relafen [Nabumetone] Swelling    Past Medical History:  Diagnosis Date  . Breast cancer ([place]) 01/2020   right breast IDC  . Bronchitis   . Cervical spondylarthritis   . Headache(784.0)   . High cholesterol   . Leukocytosis 2009  . Multiple sclerosis ([place]) 2009   followed by Dr. Erling Cruz  . Rhinitis   . Seasonal allergies   . Sinusitis     Past Surgical History:  Procedure Laterality Date  . CESAREAN SECTION     x 3  . CORRECTION HAMMER TOE Bilateral   . PARTIAL HYSTERECTOMY  2006   for uterine fibroid.   [name] PORTACATH PLACEMENT Right 02/22/2020   Procedure: RIGHT SUBCLAVIAN VEIN PORT PLACEMENT;  Surgeon: Donnie Mesa, MD;  Location: [facility];  Service: General;  Laterality: Right;    Family History  Problem Relation Age of Onset  . Cancer Mother 26       ovarian cancer; now with suspected breast cancer  . Diabetes Mother        also HTN  . Breast cancer Mother   . Clotting disorder Father   . Prostate cancer Father   . Breast cancer Maternal Aunt   . Cancer Maternal Grandmother        unknown  female reproductive organ cancer  . Breast cancer Maternal Aunt   . Breast cancer Maternal Aunt   . Colon cancer Sister 65       anal cancer (2014)  . Lung cancer Sister 78  . Stroke Sister 66  . Ovarian cancer Sister 16  . BRCA 1/2 Sister     Social History   Socioeconomic History  . Marital status: Married    Spouse name: [name]  . Number of children: 2  . Years of education: College  . Highest education level: Not on file  Occupational History  . Occupation: Print production planner: [employer]  Tobacco Use  . Smoking status: Never Smoker  .  Smokeless tobacco: Never Used  Vaping Use  . Vaping Use: Never used  Substance and Sexual Activity  . Alcohol use: No  . Drug use: No  . Sexual activity: Yes    Birth control/protection: Surgical  Other Topics Concern  . Not on file  Social History Narrative   Jacqueline lives at home with her spouse.   Caffeine Use: 2 sodas daily   Social Determinants of Health   Financial Resource Strain: Low Risk   . Difficulty of Paying Living Expenses: Not hard at all  Food Insecurity: No Food Insecurity  . Worried About Charity fundraiser in the Last Year: Never true  . Ran Out of Food in the Last Year: Never true  Transportation Needs: No Transportation Needs  . Lack of Transportation (Medical): No  . Lack of Transportation (Non-Medical): No  Physical Activity: Not on file  Stress: Not on file  Social Connections: Socially Integrated  . Frequency of Communication with Friends and Family: More than three times a week  . Frequency of Social Gatherings with Friends and Family: Not on file  . Attends Religious Services: More than 4 times per year  . Active Member of Clubs or Organizations: Not on file  . Attends Archivist Meetings: More than 4 times per year  . Marital Status: Married  Human resources officer Violence: Not on file    Past Medical History, Surgical history, Social history, and Family history were reviewed and updated as appropriate.   Please see review of systems for further details on the Jacqueline's review from today.   Review of Systems:  Review of Systems  Constitutional: Negative for chills, diaphoresis and fever.  HENT: Negative for trouble swallowing and voice change.   Respiratory: Negative for cough, chest tightness, shortness of breath and wheezing.   Cardiovascular: Negative for chest pain and palpitations.  Gastrointestinal: Negative for abdominal pain, constipation, diarrhea, nausea and vomiting.  Musculoskeletal: Negative for back pain and myalgias.   Neurological: Negative for dizziness, light-headedness and headaches.    Objective:   Physical Exam:  BP (!) 145/82 (BP Location: Left Arm, Jacqueline Position: Sitting) Comment: nurse notified  Pulse (!) 121   Temp 97.6 F (36.4 C) (Tympanic)   Resp 18   Ht _0  (1.626 m)   Wt 220 lb 1.6 oz (99.8 kg)   SpO2 97%   BMI 37.78 kg/m  ECOG: 0  Physical Exam Constitutional:      General: She is not in acute distress.    Appearance: She is not diaphoretic.  HENT:     Head: Normocephalic and atraumatic.  Eyes:     General: No scleral icterus.       Right eye: No discharge.        Left eye: No discharge.  Conjunctiva/sclera: Conjunctivae normal.  Cardiovascular:     Rate and Rhythm: Normal rate and regular rhythm.     Heart sounds: Normal heart sounds. No murmur heard. No friction rub. No gallop.   Pulmonary:     Effort: Pulmonary effort is normal. No respiratory distress.     Breath sounds: Normal breath sounds. No wheezing or rales.  Abdominal:     General: Bowel sounds are normal. There is no distension.     Palpations: Abdomen is soft.     Tenderness: There is no abdominal tenderness. There is no guarding.  Musculoskeletal:     Right lower leg: No edema.     Left lower leg: No edema.  Skin:    General: Skin is warm and dry.     Findings: No erythema or rash.  Neurological:     Mental Status: She is alert.     Coordination: Coordination normal.     Gait: Gait normal.  Psychiatric:        Mood and Affect: Mood normal.        Behavior: Behavior normal.        Thought Content: Thought content normal.        Judgment: Judgment normal.     Lab Review:     Component Value Date/Time   NA 144 05/11/2020 1332   NA 142 05/05/2020 1625   NA 143 11/22/2013 1514   K 3.4 (L) 05/11/2020 1332   K 3.1 (L) 11/22/2013 1514   CL 105 05/11/2020 1332   CL 105 02/18/2012 1207   CO2 27 05/11/2020 1332   CO2 25 11/22/2013 1514   GLUCOSE 93 05/11/2020 1332   GLUCOSE 105  11/22/2013 1514   GLUCOSE 96 02/18/2012 1207   BUN 6 05/11/2020 1332   BUN 8 05/05/2020 1625   BUN 6.6 (L) 11/22/2013 1514   CREATININE 0.65 05/11/2020 1332   CREATININE 0.8 11/22/2013 1514   CALCIUM 8.8 (L) 05/11/2020 1332   CALCIUM 9.0 11/22/2013 1514   PROT 6.8 05/11/2020 1332   PROT 7.0 09/22/2019 0840   PROT 7.4 11/22/2013 1514   ALBUMIN 3.5 05/11/2020 1332   ALBUMIN 4.0 09/22/2019 0840   ALBUMIN 3.3 (L) 11/22/2013 1514   AST 26 05/11/2020 1332   AST 15 11/22/2013 1514   ALT 32 05/11/2020 1332   ALT 13 11/22/2013 1514   ALKPHOS 90 05/11/2020 1332   ALKPHOS 91 11/22/2013 1514   BILITOT 0.3 05/11/2020 1332   BILITOT 0.23 11/22/2013 1514   GFRNONAA >60 05/11/2020 1332   GFRAA 113 09/22/2019 0840       Component Value Date/Time   WBC 8.9 05/11/2020 1332   WBC 11.6 (H) 12/30/2017 1601   RBC 3.82 (L) 05/11/2020 1332   HGB 10.1 (L) 05/11/2020 1332   HGB 12.4 09/22/2019 0840   HGB 11.5 (L) 11/22/2013 1514   HCT 30.1 (L) 05/11/2020 1332   HCT 30.0 (L) 05/05/2020 1625   HCT 36.5 11/22/2013 1514   PLT 259 05/11/2020 1332   PLT 329 09/22/2019 0840   MCV 78.8 (L) 05/11/2020 1332   MCV 78 (L) 09/22/2019 0840   MCV 75.4 (L) 11/22/2013 1514   MCH 26.4 05/11/2020 1332   MCHC 33.6 05/11/2020 1332   RDW 20.7 (H) 05/11/2020 1332   RDW 16.0 (H) 09/22/2019 0840   RDW 16.5 (H) 11/22/2013 1514   LYMPHSABS 1.5 05/11/2020 1332   LYMPHSABS 3.8 (H) 09/22/2019 0840   LYMPHSABS 4.7 (H) 11/22/2013 1514   MONOABS 0.7 05/11/2020 1332  MONOABS 0.8 11/22/2013 1514   EOSABS 0.2 05/11/2020 1332   EOSABS 0.2 09/22/2019 0840   BASOSABS 0.0 05/11/2020 1332   BASOSABS 0.1 09/22/2019 0840   BASOSABS 0.1 11/22/2013 1514   -------------------------------  Imaging from last 24 hours (if applicable):  Radiology interpretation: No results found.    OK to treat today.  Sandi Mealy, MHS, PA-C Physician Asistant

## 2020-05-17 ENCOUNTER — Other Ambulatory Visit: Payer: Self-pay

## 2020-05-17 ENCOUNTER — Inpatient Hospital Stay: Payer: 59 | Attending: Hematology and Oncology

## 2020-05-17 ENCOUNTER — Inpatient Hospital Stay: Payer: 59

## 2020-05-17 ENCOUNTER — Encounter: Payer: Self-pay | Admitting: *Deleted

## 2020-05-17 VITALS — BP 145/81 | HR 111 | Temp 98.6°F | Resp 18

## 2020-05-17 DIAGNOSIS — Z171 Estrogen receptor negative status [ER-]: Secondary | ICD-10-CM

## 2020-05-17 DIAGNOSIS — C50411 Malignant neoplasm of upper-outer quadrant of right female breast: Secondary | ICD-10-CM

## 2020-05-17 DIAGNOSIS — Z5111 Encounter for antineoplastic chemotherapy: Secondary | ICD-10-CM | POA: Insufficient documentation

## 2020-05-17 DIAGNOSIS — Z95828 Presence of other vascular implants and grafts: Secondary | ICD-10-CM

## 2020-05-17 DIAGNOSIS — D701 Agranulocytosis secondary to cancer chemotherapy: Secondary | ICD-10-CM | POA: Insufficient documentation

## 2020-05-17 LAB — CBC WITH DIFFERENTIAL (CANCER CENTER ONLY)
Abs Immature Granulocytes: 0.05 10*3/uL (ref 0.00–0.07)
Basophils Absolute: 0 10*3/uL (ref 0.0–0.1)
Basophils Relative: 1 %
Eosinophils Absolute: 0.1 10*3/uL (ref 0.0–0.5)
Eosinophils Relative: 1 %
HCT: 30.3 % — ABNORMAL LOW (ref 36.0–46.0)
Hemoglobin: 10 g/dL — ABNORMAL LOW (ref 12.0–15.0)
Immature Granulocytes: 1 %
Lymphocytes Relative: 15 %
Lymphs Abs: 1 10*3/uL (ref 0.7–4.0)
MCH: 26 pg (ref 26.0–34.0)
MCHC: 33 g/dL (ref 30.0–36.0)
MCV: 78.9 fL — ABNORMAL LOW (ref 80.0–100.0)
Monocytes Absolute: 0.3 10*3/uL (ref 0.1–1.0)
Monocytes Relative: 4 %
Neutro Abs: 5.4 10*3/uL (ref 1.7–7.7)
Neutrophils Relative %: 78 %
Platelet Count: 141 10*3/uL — ABNORMAL LOW (ref 150–400)
RBC: 3.84 MIL/uL — ABNORMAL LOW (ref 3.87–5.11)
RDW: 19.7 % — ABNORMAL HIGH (ref 11.5–15.5)
WBC Count: 6.8 10*3/uL (ref 4.0–10.5)
nRBC: 0 % (ref 0.0–0.2)

## 2020-05-17 LAB — CMP (CANCER CENTER ONLY)
ALT: 26 U/L (ref 0–44)
AST: 24 U/L (ref 15–41)
Albumin: 3.7 g/dL (ref 3.5–5.0)
Alkaline Phosphatase: 89 U/L (ref 38–126)
Anion gap: 13 (ref 5–15)
BUN: 7 mg/dL (ref 6–20)
CO2: 24 mmol/L (ref 22–32)
Calcium: 8.9 mg/dL (ref 8.9–10.3)
Chloride: 105 mmol/L (ref 98–111)
Creatinine: 0.66 mg/dL (ref 0.44–1.00)
GFR, Estimated: >60 mL/min (ref >60)
Glucose, Bld: 105 mg/dL — ABNORMAL HIGH (ref 70–99)
Potassium: 3.5 mmol/L (ref 3.5–5.1)
Sodium: 142 mmol/L (ref 135–145)
Total Bilirubin: 0.3 mg/dL (ref 0.3–1.2)
Total Protein: 6.8 g/dL (ref 6.5–8.1)

## 2020-05-17 MED ORDER — SODIUM CHLORIDE 0.9% FLUSH
10.0000 mL | INTRAVENOUS | Status: DC | PRN
  Administered 2020-05-17: 10 mL
  Filled 2020-05-17: qty 10

## 2020-05-17 MED ORDER — FAMOTIDINE IN NACL 20-0.9 MG/50ML-% IV SOLN
INTRAVENOUS | Status: AC
  Filled 2020-05-17: qty 50

## 2020-05-17 MED ORDER — FAMOTIDINE IN NACL 20-0.9 MG/50ML-% IV SOLN
20.0000 mg | Freq: Once | INTRAVENOUS | Status: AC
  Administered 2020-05-17: 20 mg via INTRAVENOUS

## 2020-05-17 MED ORDER — SODIUM CHLORIDE 0.9% FLUSH
10.0000 mL | Freq: Once | INTRAVENOUS | Status: AC
  Administered 2020-05-17: 10 mL
  Filled 2020-05-17: qty 10

## 2020-05-17 MED ORDER — DIPHENHYDRAMINE HCL 50 MG/ML IJ SOLN
25.0000 mg | Freq: Once | INTRAMUSCULAR | Status: AC
  Administered 2020-05-17: 25 mg via INTRAVENOUS

## 2020-05-17 MED ORDER — SODIUM CHLORIDE 0.9 % IV SOLN
Freq: Once | INTRAVENOUS | Status: AC
  Filled 2020-05-17: qty 250

## 2020-05-17 MED ORDER — HEPARIN SOD (PORK) LOCK FLUSH 100 UNIT/ML IV SOLN
500.0000 [IU] | Freq: Once | INTRAVENOUS | Status: AC | PRN
  Administered 2020-05-17: 500 [IU]
  Filled 2020-05-17: qty 5

## 2020-05-17 MED ORDER — DIPHENHYDRAMINE HCL 50 MG/ML IJ SOLN
INTRAMUSCULAR | Status: AC
  Filled 2020-05-17: qty 1

## 2020-05-17 MED ORDER — SODIUM CHLORIDE 0.9 % IV SOLN
80.0000 mg/m2 | Freq: Once | INTRAVENOUS | Status: AC
  Administered 2020-05-17: 168 mg via INTRAVENOUS
  Filled 2020-05-17: qty 28

## 2020-05-17 MED ORDER — SODIUM CHLORIDE 0.9 % IV SOLN
20.0000 mg | Freq: Once | INTRAVENOUS | Status: AC
  Administered 2020-05-17: 20 mg via INTRAVENOUS
  Filled 2020-05-17: qty 20

## 2020-05-17 NOTE — Patient Instructions (Signed)
Implanted Port Insertion, Care After This sheet gives you information about how to care for yourself after your procedure. Your health care provider may also give you more specific instructions. If you have problems or questions, contact your health care provider. What can I expect after the procedure? After the procedure, it is common to have:  Discomfort at the port insertion site.  Bruising on the skin over the port. This should improve over 3-4 days. Follow these instructions at home: Port care  After your port is placed, you will get a manufacturer's information card. The card has information about your port. Keep this card with you at all times.  Take care of the port as told by your health care provider. Ask your health care provider if you or a family member can get training for taking care of the port at home. A home health care nurse may also take care of the port.  Make sure to remember what type of port you have. Incision care  Follow instructions from your health care provider about how to take care of your port insertion site. Make sure you: ? Wash your hands with soap and water before and after you change your bandage (dressing). If soap and water are not available, use hand sanitizer. ? Change your dressing as told by your health care provider. ? Leave stitches (sutures), skin glue, or adhesive strips in place. These skin closures may need to stay in place for 2 weeks or longer. If adhesive strip edges start to loosen and curl up, you may trim the loose edges. Do not remove adhesive strips completely unless your health care provider tells you to do that.  Check your port insertion site every day for signs of infection. Check for: ? Redness, swelling, or pain. ? Fluid or blood. ? Warmth. ? Pus or a bad smell.      Activity  Return to your normal activities as told by your health care provider. Ask your health care provider what activities are safe for you.  Do not  lift anything that is heavier than 10 lb (4.5 kg), or the limit that you are told, until your health care provider says that it is safe. General instructions  Take over-the-counter and prescription medicines only as told by your health care provider.  Do not take baths, swim, or use a hot tub until your health care provider approves. Ask your health care provider if you may take showers. You may only be allowed to take sponge baths.  Do not drive for 24 hours if you were given a sedative during your procedure.  Wear a medical alert bracelet in case of an emergency. This will tell any health care providers that you have a port.  Keep all follow-up visits as told by your health care provider. This is important. Contact a health care provider if:  You cannot flush your port with saline as directed, or you cannot draw blood from the port.  You have a fever or chills.  You have redness, swelling, or pain around your port insertion site.  You have fluid or blood coming from your port insertion site.  Your port insertion site feels warm to the touch.  You have pus or a bad smell coming from the port insertion site. Get help right away if:  You have chest pain or shortness of breath.  You have bleeding from your port that you cannot control. Summary  Take care of the port as told by your   health care provider. Keep the manufacturer's information card with you at all times.  Change your dressing as told by your health care provider.  Contact a health care provider if you have a fever or chills or if you have redness, swelling, or pain around your port insertion site.  Keep all follow-up visits as told by your health care provider. This information is not intended to replace advice given to you by your health care provider. Make sure you discuss any questions you have with your health care provider. Document Revised: 08/26/2017 Document Reviewed: 08/26/2017 [employer]vier Patient Education   2021 [employer]  

## 2020-05-17 NOTE — Patient Instructions (Signed)
[facility] Discharge Instructions for Patients Receiving Chemotherapy  Today you received the following chemotherapy agents:  Taxol.  To help prevent nausea and vomiting after your treatment, we encourage you to take your nausea medication as directed.   If you develop nausea and vomiting that is not controlled by your nausea medication, call the clinic.   BELOW ARE SYMPTOMS THAT SHOULD BE REPORTED IMMEDIATELY:  *FEVER GREATER THAN 100.5 F  *CHILLS WITH OR WITHOUT FEVER  NAUSEA AND VOMITING THAT IS NOT CONTROLLED WITH YOUR NAUSEA MEDICATION  *UNUSUAL SHORTNESS OF BREATH  *UNUSUAL BRUISING OR BLEEDING  TENDERNESS IN MOUTH AND THROAT WITH OR WITHOUT PRESENCE OF ULCERS  *URINARY PROBLEMS  *BOWEL PROBLEMS  UNUSUAL RASH Items with * indicate a potential emergency and should be followed up as soon as possible.  Feel free to call the clinic should you have any questions or concerns. The clinic phone number is [phone].  Please show the CHEMO ALERT CARD at check-in to the Emergency Department and triage nurse.   

## 2020-05-17 NOTE — Progress Notes (Signed)
Per MD okay to treat with HR 111 today.  

## 2020-05-22 ENCOUNTER — Encounter (HOSPITAL_COMMUNITY): Payer: Self-pay

## 2020-05-22 ENCOUNTER — Other Ambulatory Visit: Payer: Self-pay

## 2020-05-22 ENCOUNTER — Emergency Department (HOSPITAL_COMMUNITY)
Admission: EM | Admit: 2020-05-22 | Discharge: 2020-05-22 | Disposition: A | Discharge status: Home / Self Care | Payer: 59 | Attending: Emergency Medicine | Admitting: Emergency Medicine

## 2020-05-22 DIAGNOSIS — R42 Dizziness and giddiness: Secondary | ICD-10-CM | POA: Diagnosis not present

## 2020-05-22 DIAGNOSIS — J45909 Unspecified asthma, uncomplicated: Secondary | ICD-10-CM | POA: Diagnosis not present

## 2020-05-22 DIAGNOSIS — R112 Nausea with vomiting, unspecified: Secondary | ICD-10-CM | POA: Insufficient documentation

## 2020-05-22 DIAGNOSIS — Z853 Personal history of malignant neoplasm of breast: Secondary | ICD-10-CM | POA: Diagnosis not present

## 2020-05-22 DIAGNOSIS — E876 Hypokalemia: Secondary | ICD-10-CM | POA: Insufficient documentation

## 2020-05-22 DIAGNOSIS — R197 Diarrhea, unspecified: Secondary | ICD-10-CM

## 2020-05-22 LAB — CBC
HCT: 30.8 % — ABNORMAL LOW (ref 36.0–46.0)
Hemoglobin: 10 g/dL — ABNORMAL LOW (ref 12.0–15.0)
MCH: 26.7 pg (ref 26.0–34.0)
MCHC: 32.5 g/dL (ref 30.0–36.0)
MCV: 82.4 fL (ref 80.0–100.0)
Platelets: 174 10*3/uL (ref 150–400)
RBC: 3.74 MIL/uL — ABNORMAL LOW (ref 3.87–5.11)
RDW: 20.5 % — ABNORMAL HIGH (ref 11.5–15.5)
WBC: 5.9 10*3/uL (ref 4.0–10.5)
nRBC: 0 % (ref 0.0–0.2)

## 2020-05-22 LAB — COMPREHENSIVE METABOLIC PANEL
ALT: 36 U/L (ref 0–44)
AST: 36 U/L (ref 15–41)
Albumin: 3.7 g/dL (ref 3.5–5.0)
Alkaline Phosphatase: 73 U/L (ref 38–126)
Anion gap: 9 (ref 5–15)
BUN: 5 mg/dL — ABNORMAL LOW (ref 6–20)
CO2: 25 mmol/L (ref 22–32)
Calcium: 8.8 mg/dL — ABNORMAL LOW (ref 8.9–10.3)
Chloride: 104 mmol/L (ref 98–111)
Creatinine, Ser: 0.6 mg/dL (ref 0.44–1.00)
GFR, Estimated: >60 mL/min (ref >60)
Glucose, Bld: 137 mg/dL — ABNORMAL HIGH (ref 70–99)
Potassium: 3.2 mmol/L — ABNORMAL LOW (ref 3.5–5.1)
Sodium: 138 mmol/L (ref 135–145)
Total Bilirubin: 0.5 mg/dL (ref 0.3–1.2)
Total Protein: 6.7 g/dL (ref 6.5–8.1)

## 2020-05-22 LAB — LIPASE, BLOOD: Lipase: 21 U/L (ref 11–51)

## 2020-05-22 LAB — URINALYSIS, ROUTINE W REFLEX MICROSCOPIC
Bilirubin Urine: NEGATIVE
Glucose, UA: NEGATIVE mg/dL
Hgb urine dipstick: NEGATIVE
Ketones, ur: NEGATIVE mg/dL
Leukocytes,Ua: NEGATIVE
Nitrite: NEGATIVE
Protein, ur: NEGATIVE mg/dL
Specific Gravity, Urine: 1.003 — ABNORMAL LOW (ref 1.005–1.030)
pH: 8 (ref 5.0–8.0)

## 2020-05-22 LAB — MAGNESIUM: Magnesium: 1.8 mg/dL (ref 1.7–2.4)

## 2020-05-22 MED ORDER — POTASSIUM CHLORIDE CRYS ER 20 MEQ PO TBCR: 20.0000 meq | EXTENDED_RELEASE_TABLET | Freq: Two times a day (BID) | ORAL | 0 refills | Status: DC

## 2020-05-22 MED ORDER — SODIUM CHLORIDE 0.9 % IV SOLN
12.5000 mg | Freq: Four times a day (QID) | INTRAVENOUS | Status: DC | PRN
  Administered 2020-05-22: 12.5 mg via INTRAVENOUS
  Filled 2020-05-22: qty 0.5

## 2020-05-22 MED ORDER — DIAZEPAM 5 MG PO TABS
5.0000 mg | ORAL_TABLET | Freq: Once | ORAL | Status: AC
  Administered 2020-05-22: 5 mg via ORAL
  Filled 2020-05-22: qty 1

## 2020-05-22 MED ORDER — MECLIZINE HCL 25 MG PO TABS: 25.0000 mg | ORAL_TABLET | Freq: Three times a day (TID) | ORAL | 0 refills | Status: DC | PRN

## 2020-05-22 MED ORDER — SODIUM CHLORIDE 0.9 % IV BOLUS
1000.0000 mL | Freq: Once | INTRAVENOUS | Status: AC
  Administered 2020-05-22: 1000 mL via INTRAVENOUS

## 2020-05-22 NOTE — ED Triage Notes (Signed)
Patient c/o diarrhea x 3 days. Patient also reports that she has had vomiting and dizziness since waking this AM. Patient received her last chemo treatment 5 days ago.

## 2020-05-22 NOTE — ED Provider Notes (Signed)
[facility] DEPT Provider Note   CSN: [id number] Arrival date & time: 05/22/20  9509     History Chief Complaint  Patient presents with  . chemo patient  . Diarrhea  . Emesis    Jacqueline Adams is a 60 y.o. female.  Patient with history of breast cancer, undergoing active chemotherapy, last treatment 5 days ago presents the emergency department for evaluation of vertigo and vomiting.  Patient reports having watery, nonbloody diarrhea over the past 3 days.  She had an episode this morning, however overall this is improving.  This morning she awoke at 6:30 AM with vertigo symptoms.  She reports awaking at about 4 AM to use the restroom and did not note any vertigo symptoms at that point.  She had associated vomiting x2 after waking.  She reports some difficulty walking due to the spinning sensation.  She denies chest pain or shortness of breath.  No abdominal pain or urinary symptoms.  No treatments prior to arrival.  Patient denies signs of stroke including: facial droop, slurred speech, aphasia, weakness/numbness in extremities.           Past Medical History:  Diagnosis Date  . Breast cancer ([place]) 01/2020   right breast IDC  . Bronchitis   . Cervical spondylarthritis   . Headache(784.0)   . High cholesterol   . Leukocytosis 2009  . Multiple sclerosis ([place]) 2009   followed by Dr. Erling Cruz  . Rhinitis   . Seasonal allergies   . Sinusitis     Patient Active Problem List   Diagnosis Date Noted  . Port-A-Cath in place 04/05/2020  . Malignant neoplasm of upper-outer quadrant of right breast in female, estrogen receptor negative ([place]) 01/10/2020  . High risk medication use 03/25/2019  . Microcytic anemia 12/31/2017  . 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 UACS 12/16/2017  . History of pneumonia 11/12/2017  . Morbid obesity ([place]) 08/19/2016  . Numbness 05/09/2015  . Other fatigue 05/09/2015  . Arthralgia of hip 11/16/2014  . Low back pain with sciatica  11/16/2014  . Urinary frequency 11/16/2014  . Encounter for general adult medical examination without abnormal findings 06/07/2014  . DOE (dyspnea on exertion) 04/27/2014  . Atypical chest pain 04/27/2014  . Dyslipidemia 04/27/2014  . HLD (hyperlipidemia) 02/14/2014  . Cervical spondylosis without myelopathy 07/28/2013  . Cervical pain 07/28/2013  . CFIDS (chronic fatigue and immune dysfunction syndrome) ([place]) 07/28/2013  . Chronic infection of sinus 07/28/2013  . Headache, migraine 07/28/2013  . 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 Spasms of the hands or feet 07/28/2013  . DDD (degenerative disc disease), cervical 07/28/2013  . Menopausal symptom 07/28/2013  . Taking drug for chronic disease 07/28/2013  . Vitamin D deficiency 07/28/2013  . Degeneration of intervertebral disc of cervical region 07/28/2013  . Cough 04/06/2013  . BRCA gene mutation positive in female 06/12/2011  . Genetic susceptibility to malignant neoplasm of breast 06/12/2011  . Multiple sclerosis ([place])   . Leukocytosis     Past Surgical History:  Procedure Laterality Date  . CESAREAN SECTION     x 3  . CORRECTION HAMMER TOE Bilateral   . PARTIAL HYSTERECTOMY  2006   for uterine fibroid.   [name] PORTACATH PLACEMENT Right 02/22/2020   Procedure: RIGHT SUBCLAVIAN VEIN PORT PLACEMENT;  Surgeon: Donnie Mesa, MD;  Location: [facility];  Service: General;  Laterality: Right;     OB History   No obstetric history on file.  Family History  Problem Relation Age of Onset  . Cancer Mother 16       ovarian cancer; now with suspected breast cancer  . Diabetes Mother        also HTN  . Breast cancer Mother   . Clotting disorder Father   . Prostate cancer Father   . Breast cancer Maternal Aunt   . Cancer Maternal Grandmother        unknown female reproductive organ cancer  . Breast cancer Maternal Aunt   . Breast cancer Maternal Aunt   . Colon cancer Sister 14       anal cancer  (2014)  . Lung cancer Sister 41  . Stroke Sister 48  . Ovarian cancer Sister 79  . BRCA 1/2 Sister     Social History   Tobacco Use  . Smoking status: Never Smoker  . Smokeless tobacco: Never Used  Vaping Use  . Vaping Use: Never used  Substance Use Topics  . Alcohol use: No  . Drug use: No    Home Medications Prior to Admission medications   Medication Sig Start Date End Date Taking? Authorizing Provider  dexamethasone (DECADRON) 4 MG tablet Take 1 tablet day before chemo and 1 tablet day after chemo with food 01/12/20   Serena Croissant, MD  fexofenadine (ALLEGRA) 180 MG tablet Take 180 mg by mouth daily.    [provider]  fluticasone (FLONASE) 50 MCG/ACT nasal spray Place 2 sprays into both nostrils daily. 04/05/13   Kalman Shan, MD  ibuprofen (ADVIL) 400 MG tablet Take 1 tablet (400 mg total) by mouth every 8 (eight) hours as needed. 09/22/18   Sater, Pearletha Furl, MD  interferon beta-1a (AVONEX PREFILLED) 30 MCG/0.5ML PSKT injection INJECT ONE SYRINGE INTRAMUSCULARLY ONCE WEEKLY. REFRIGERATE. PROTECT FROM LIGHT. ALLOW TO WARM TO ROOM TEMPERATURE PRIOR TO USE. 03/23/20   Sater, Pearletha Furl, MD  lidocaine-prilocaine (EMLA) cream Apply to affected area once 01/12/20   Serena Croissant, MD  Multiple Vitamins-Minerals (CENTRUM SILVER ADULT 50+ PO) Take by mouth.    [provider]  ondansetron (ZOFRAN) 8 MG tablet Take 1 tablet (8 mg total) by mouth 2 (two) times daily as needed. Start on the third day after chemotherapy. 01/12/20   Serena Croissant, MD  prochlorperazine (COMPAZINE) 10 MG tablet Take 1 tablet (10 mg total) by mouth every 6 (six) hours as needed (Nausea or vomiting). 01/12/20   Serena Croissant, MD  simvastatin (ZOCOR) 20 MG tablet Take 1 tablet by mouth daily. 10/04/12   [provider]  topiramate (TOPAMAX) 25 MG tablet Take 25 mg by mouth. 08/21/16 02/16/20  [provider]  VENTOLIN HFA 108 (90 BASE) MCG/ACT inhaler Inhale 2 puffs into the lungs  every 6 (six) hours as needed. 03/31/13   [provider]  VITAMIN D PO Take 2 tablets by mouth daily. +calcium    [provider]    Allergies    Procaine and Relafen [nabumetone]  Review of Systems   Review of Systems  Constitutional: Negative for fever.  HENT: Positive for congestion and sinus pressure. Negative for hearing loss, rhinorrhea and sore throat.   Eyes: Negative for redness.  Respiratory: Negative for cough.   Cardiovascular: Negative for chest pain.  Gastrointestinal: Positive for diarrhea, nausea and vomiting. Negative for abdominal pain.  Genitourinary: Negative for dysuria, frequency, hematuria and urgency.  Musculoskeletal: Negative for myalgias.  Skin: Negative for rash.  Neurological: Positive for dizziness. Negative for headaches.    Physical Exam  Updated Vital Signs BP 139/76 (BP Location: Left Arm)   Pulse 93   Temp 98 F (36.7 C) (Oral)   Resp 18   Ht _0  (1.626 m)   Wt 98.4 kg   SpO2 99%   BMI 37.25 kg/m   Physical Exam Vitals and nursing note reviewed.  Constitutional:      General: She is not in acute distress.    Appearance: She is well-developed.  HENT:     Head: Normocephalic and atraumatic.     Right Ear: External ear normal.     Left Ear: External ear normal.     Nose: Nose normal.  Eyes:     Conjunctiva/sclera: Conjunctivae normal.     Comments: Patient with horizontal nystagmus noted with fast phase to the right, even with straight forward gaze.  Not present when she gazes towards the left.  Does not seem to be exacerbated with head movements.  Cardiovascular:     Rate and Rhythm: Normal rate and regular rhythm.     Heart sounds: No murmur heard.   Pulmonary:     Effort: No respiratory distress.     Breath sounds: No wheezing, rhonchi or rales.  Abdominal:     Palpations: Abdomen is soft.     Tenderness: There is no abdominal tenderness. There is no guarding or rebound.  Musculoskeletal:     Cervical  back: Normal range of motion and neck supple.     Right lower leg: No edema.     Left lower leg: No edema.  Skin:    General: Skin is warm and dry.     Findings: No rash.  Neurological:     General: No focal deficit present.     Mental Status: She is alert. Mental status is at baseline.     GCS: GCS eye subscore is 4. GCS verbal subscore is 5. GCS motor subscore is 6.     Cranial Nerves: No cranial nerve deficit, dysarthria or facial asymmetry.     Sensory: Sensation is intact.     Motor: No weakness.  Psychiatric:        Mood and Affect: Mood normal.     ED Results / Procedures / Treatments   Labs (all labs ordered are listed, but only abnormal results are displayed) Labs Reviewed  COMPREHENSIVE METABOLIC PANEL - Abnormal; Notable for the following components:      Result Value   Potassium 3.2 (*)    Glucose, Bld 137 (*)    BUN 5 (*)    Calcium 8.8 (*)    All other components within normal limits  CBC - Abnormal; Notable for the following components:   RBC 3.74 (*)    Hemoglobin 10.0 (*)    HCT 30.8 (*)    RDW 20.5 (*)    All other components within normal limits  URINALYSIS, ROUTINE W REFLEX MICROSCOPIC - Abnormal; Notable for the following components:   Color, Urine STRAW (*)    Specific Gravity, Urine 1.003 (*)    All other components within normal limits  LIPASE, BLOOD  MAGNESIUM    EKG None  Radiology No results found.  Procedures Procedures   Medications Ordered in ED Medications  promethazine (PHENERGAN) 12.5 mg in sodium chloride 0.9 % 50 mL IVPB (0 mg Intravenous Stopped 05/22/20 0944)  sodium chloride 0.9 % bolus 1,000 mL (0 mLs Intravenous Stopped 05/22/20 0938)  diazepam (VALIUM) tablet 5 mg (5 mg Oral Given 05/22/20 0930)  sodium chloride 0.9 %  bolus 1,000 mL (1,000 mLs Intravenous New Bag/Given 05/22/20 1146)    ED Course  I have reviewed the triage vital signs and the nursing notes.  Pertinent labs & imaging results that were available during  my care of the patient were reviewed by me and considered in my medical decision making (see chart for details).  Patient seen and examined. Work-up initiated. Medications ordered.   Vital signs reviewed and are as follows: BP 139/76 (BP Location: Left Arm)   Pulse 93   Temp 98 F (36.7 C) (Oral)   Resp 18   Ht _0  (1.626 m)   Wt 98.4 kg   SpO2 99%   BMI 37.25 kg/m   Pt reports improving symptoms with treatment.  I watched her ambulate in the hallway without any assistance.  On reexam, mild nystagmus noted.  Patient discussed with and seen by Dr. Joya Gaskins. Agrees these symptoms are likely peripheral. Pt reports recent sinus pressure and congestion, possibly contributing to vertigo.   12:54 PM patient is finishing with IV fluids.  She is feeling a lot better.  She states that there is no longer spinning.  Comfortable with discharge to home.  Will prescribe meclizine to use as needed.    MDM Rules/Calculators/A&P                          Patient with vertigo symptoms, improved with treatment in the ED.  Patient has has a concurrent sinusitis.  Suspect peripheral vertigo.  Labs are reassuring.  Patient was given IV hydration with improvement as well.  No further vomiting here in the ED.  She is ambulatory without difficulty.  No focal neuro deficits.  Low concern for central cause such as posterior stroke today.  Will treat as peripheral vertigo.  Return instructions as above.    Final Clinical Impression(s) / ED Diagnoses Final diagnoses:  Vertigo  Nausea vomiting and diarrhea  Hypokalemia    Rx / DC Orders ED Discharge Orders         Ordered    meclizine (ANTIVERT) 25 MG tablet  3 times daily PRN        05/22/20 1255    potassium chloride SA (KLOR-CON) 20 MEQ tablet  2 times daily        05/22/20 1257           Carlisle Cater, PA-C 05/22/20 [place], DO 05/23/20 1600

## 2020-05-22 NOTE — Discharge Instructions (Signed)
Please read and follow all provided instructions.  Your diagnoses today include:  1. Vertigo   2. Nausea vomiting and diarrhea     Tests performed today include: Blood cell counts (white, red, and platelets) Electrolytes - slightly low potassium Kidney function test Urine test to check for infection  Vital signs. See below for your results today.   Medications prescribed:   Meclizine - antihistamine medication that helps with vertigo   Potassium supplement - to take over the next several days due to slightly low potassium here today  Take any prescribed medications only as directed.  Home care instructions:  Follow any educational materials contained in this packet.  BE VERY CAREFUL not to take multiple medicines containing Tylenol (also called acetaminophen). Doing so can lead to an overdose which can damage your liver and cause liver failure and possibly death.   Follow-up instructions: Please follow-up with your primary care provider in the next 3 days for further evaluation of your symptoms.   Return instructions:   Please return to the Emergency Department if you experience worsening symptoms.   Return if you have weakness in your arms or legs, slurred speech, trouble walking or talking, confusion, or trouble with your balance.   Please return if you have any other emergent concerns.  Additional Information:  Your vital signs today were: BP 125/82   Pulse 93   Temp 98 F (36.7 C) (Oral)   Resp 19   Ht 5\' 4"  (1.626 m)   Wt 98.4 kg   SpO2 98%   BMI 37.25 kg/m  If your blood pressure (BP) was elevated above 135/85 this visit, please have this repeated by your doctor within one month. --------------

## 2020-05-23 ENCOUNTER — Telehealth: Payer: Self-pay

## 2020-05-23 DIAGNOSIS — R079 Chest pain, unspecified: Secondary | ICD-10-CM

## 2020-05-23 NOTE — Progress Notes (Signed)
Patient Care Team: Robyne Peers, MD as PCP - General (Family Medicine) Love, Alyson Locket, MD (Neurology) Jerrell Belfast, MD Mauro Kaufmann, RN as Oncology Nurse Navigator Rockwell Germany, RN as Oncology Nurse Navigator Donnie Mesa, MD as Consulting Physician (General Surgery) Nicholas Lose, MD as Consulting Physician (Hematology and Oncology) Kyung Rudd, MD as Consulting Physician (Radiation Oncology)  DIAGNOSIS:    ICD-10-CM   1. Malignant neoplasm of upper-outer quadrant of right breast in female, estrogen receptor negative ([place])  C50.411    Z17.1     SUMMARY OF ONCOLOGIC HISTORY: Oncology History  Malignant neoplasm of upper-outer quadrant of right breast in female, estrogen receptor negative ([place])  01/10/2020 Initial Diagnosis   Palpable right breast mass. Mammogram showed a 1.8cm mass at the 10:30 position 2cm from the nipple, a 1.5cm mass at the 10:30 position 7cm from the nipple, and indeterminate calcifications between the two masses in the right breast. 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    01/12/2020 Cancer Staging   Staging form: Breast, AJCC 8th Edition - Clinical stage from 01/12/2020: Stage IB (cT1c, cN0, cM0, G3, ER-, PR-, HER2-) - Signed by Nicholas Lose, MD on 02/03/2020   02/23/2020 -  Chemotherapy    Patient is on Treatment Plan: BREAST DOSE DENSE AC Q14D / CARBOPLATIN D1 + PACLITAXEL D1,8,15 Q21D        CHIEF COMPLIANT: Cycle6Taxol   INTERVAL HISTORY: Jacqueline Adams is a 60 y.o. with above-mentioned history of BRCA2 mutation and right breast cancer currently on neoadjuvant chemotherapy withweekly Taxol and Carboplatin every 3 weeks.She was evaluated in the ED on 4/11 for vertigo symptoms. She presents to the clinic todayfora toxicity checkandcycle6.  She continues to have fatigue.  Her major other new complaint is diarrhea for which she took intermittent  Imodium which helped her diarrhea but she did not take it immediately and she still had 3-4 loose stools per day.  She also had mild nausea.  ALLERGIES:  is allergic to procaine and relafen [nabumetone].  MEDICATIONS:  Current Outpatient Medications  Medication Sig Dispense Refill  . dexamethasone (DECADRON) 4 MG tablet Take 1 tablet day before chemo and 1 tablet day after chemo with food 12 tablet 0  . fexofenadine (ALLEGRA) 180 MG tablet Take 180 mg by mouth daily.    . fluticasone (FLONASE) 50 MCG/ACT nasal spray Place 2 sprays into both nostrils daily. 16 g 2  . ibuprofen (ADVIL) 400 MG tablet Take 1 tablet (400 mg total) by mouth every 8 (eight) hours as needed. 90 tablet 5  . interferon beta-1a (AVONEX PREFILLED) 30 MCG/0.5ML PSKT injection INJECT ONE SYRINGE INTRAMUSCULARLY ONCE WEEKLY. REFRIGERATE. PROTECT FROM LIGHT. ALLOW TO WARM TO ROOM TEMPERATURE PRIOR TO USE. 3 each 3  . lidocaine-prilocaine (EMLA) cream Apply to affected area once 30 g 3  . meclizine (ANTIVERT) 25 MG tablet Take 1 tablet (25 mg total) by mouth 3 (three) times daily as needed for dizziness. 30 tablet 0  . metoprolol succinate (TOPROL-XL) 25 MG 24 hr tablet Take 25 mg by mouth daily.    . Multiple Vitamins-Minerals (CENTRUM SILVER ADULT 50+ PO) Take by mouth.    . ondansetron (ZOFRAN) 8 MG tablet Take 1 tablet (8 mg total) by mouth 2 (two) times daily as needed. Start on the third day after chemotherapy. 30 tablet 1  . potassium chloride SA (KLOR-CON) 20 MEQ tablet Take 1 tablet (20  mEq total) by mouth 2 (two) times daily. 10 tablet 0  . prochlorperazine (COMPAZINE) 10 MG tablet Take 1 tablet (10 mg total) by mouth every 6 (six) hours as needed (Nausea or vomiting). 30 tablet 1  . simvastatin (ZOCOR) 20 MG tablet Take 1 tablet by mouth daily.    [name] topiramate (TOPAMAX) 25 MG tablet Take 25 mg by mouth.    . VENTOLIN HFA 108 (90 BASE) MCG/ACT inhaler Inhale 2 puffs into the lungs every 6 (six) hours as needed.    [name]  VITAMIN D PO Take 2 tablets by mouth daily. +calcium     No current facility-administered medications for this visit.    PHYSICAL EXAMINATION: ECOG PERFORMANCE STATUS: 1 - Symptomatic but completely ambulatory  There were no vitals filed for this visit. There were no vitals filed for this visit.   LABORATORY DATA:  I have reviewed the data as listed CMP Latest Ref Rng & Units 05/22/2020 05/17/2020 05/11/2020  Glucose 70 - 99 mg/dL 137(H) 105(H) 93  BUN 6 - 20 mg/dL 5(L) 7 6  Creatinine 0.44 - 1.00 mg/dL 0.60 0.66 0.65  Sodium 135 - 145 mmol/L 138 142 144  Potassium 3.5 - 5.1 mmol/L 3.2(L) 3.5 3.4(L)  Chloride 98 - 111 mmol/L 104 105 105  CO2 22 - 32 mmol/L 25 24 27   Calcium 8.9 - 10.3 mg/dL 8.8(L) 8.9 8.8(L)  Total Protein 6.5 - 8.1 g/dL 6.7 6.8 6.8  Total Bilirubin 0.3 - 1.2 mg/dL 0.5 0.3 0.3  Alkaline Phos 38 - 126 U/L 73 89 90  AST 15 - 41 U/L 36 24 26  ALT 0 - 44 U/L 36 26 32    Lab Results  Component Value Date   WBC 5.9 05/22/2020   HGB 10.0 (L) 05/22/2020   HCT 30.8 (L) 05/22/2020   MCV 82.4 05/22/2020   PLT 174 05/22/2020   NEUTROABS 5.4 05/17/2020    ASSESSMENT & PLAN:  Malignant neoplasm of upper-outer quadrant of right breast in female, estrogen receptor negative ([place]) 01/10/2020:Palpable right breast mass. Mammogram showed a 1.8cm mass at the 10:30 position 2cm from the nipple, a 1.5cm mass at the 10:30 position 7cm from the nipple, and indeterminate calcifications between the two masses in the right breast. 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 BRCA2Mutation  Treatment plan: 1. Neoadjuvant chemotherapy with Adriamycin and Cytoxan every2weeks4 followed by Taxol weekly 12 with carboplatinand every 3 weeks x4  2. Followed by breast conserving surgery with sentinel lymph node study vsbilateral mastectomies 3. Followed by adjuvant radiation therapyif she undergoes breast  conserving surgery 4.Adjuvant olaparib URCC nausea study  Breast MRI12/04/2019: 2 cm spiculated mass UOQ right breast, 2.1 cm spiculated mass UOQ right breast, biopsy-proven malignancies. 7 mm suspicious enhancing mass: Biopsy 02/02/2020: High-grade DCIS ----------------------------------------------------------------------------------------------------------------------------------------------- Current treatment:Completed 4 cycles ofneoadjuvant Adriamycin and Cytoxan, today cycle 4 Taxol (with carboplatin every 3 weeks) Echocardiogram 01/14/2020: EF 55 to 60%  Chemo Toxicities: 1.Fatigue 2.shortness of breath to minimal exertion: related to fatigue. 3.Neutropenia:Reduceddose of chemo with cycle 2. 4.  Tachycardia: Cardiac evaluation is being planned 5.  Nausea: We will add Zofran with each treatment 6.  Diarrhea: Instructed her to take 2 tablets of Imodium with first loose stool.  Today she receives Taxol.  Next week she receives both Taxol and carboplatin.  There is a breast MRI scheduled for this month.  I will move it to be done after chemo is  completed.  RTCweekly for Taxol       No orders of the defined types were placed in this encounter.  The patient has a good understanding of the overall plan. she agrees with it. she will call with any problems that may develop before the next visit here.  Total time spent: 30 mins including face to face time and time spent for planning, charting and coordination of care  Rulon Eisenmenger, MD, MPH 05/24/2020  I, Molly Dorshimer, am acting as scribe for Dr. Nicholas Lose.  I have reviewed the above documentation for accuracy and completeness, and I agree with the above.

## 2020-05-23 NOTE — Telephone Encounter (Signed)
Attestation order placed for Dr. Johnsie Cancel to sign.

## 2020-05-23 NOTE — Assessment & Plan Note (Signed)
01/10/2020:Palpable right breast mass. Mammogram showed a 1.8cm mass at the 10:30 position 2cm from the nipple, a 1.5cm mass at the 10:30 position 7cm from the nipple, and indeterminate calcifications between the two masses in the right breast. 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 BRCA2Mutation  Treatment plan: 1. Neoadjuvant chemotherapy with Adriamycin and Cytoxan every2weeks4 followed by Taxol weekly 12 with carboplatinand every 3 weeks x4  2. Followed by breast conserving surgery with sentinel lymph node study vsbilateral mastectomies 3. Followed by adjuvant radiation therapyif she undergoes breast conserving surgery 4.Adjuvant olaparib URCC nausea study  Breast MRI12/04/2019: 2 cm spiculated mass UOQ right breast, 2.1 cm spiculated mass UOQ right breast, biopsy-proven malignancies. 7 mm suspicious enhancing mass: Biopsy 02/02/2020: High-grade DCIS ----------------------------------------------------------------------------------------------------------------------------------------------- Current treatment:Completed 4 cycles ofneoadjuvant Adriamycin and Cytoxan, today cycle 4 Taxol (with carboplatin every 3 weeks) Echocardiogram 01/14/2020: EF 55 to 60%  Chemo Toxicities: 1.Fatigue 2.shortness of breath to minimal exertion: related to fatigue. 3.Neutropenia:Reduceddose of chemo with cycle 2.   RTCweekly for Taxol and every other week for follow up.

## 2020-05-24 ENCOUNTER — Inpatient Hospital Stay: Payer: 59 | Admitting: Hematology and Oncology

## 2020-05-24 ENCOUNTER — Other Ambulatory Visit: Payer: Self-pay

## 2020-05-24 ENCOUNTER — Inpatient Hospital Stay: Payer: 59

## 2020-05-24 ENCOUNTER — Encounter: Payer: Self-pay | Admitting: *Deleted

## 2020-05-24 VITALS — HR 107

## 2020-05-24 DIAGNOSIS — C50411 Malignant neoplasm of upper-outer quadrant of right female breast: Secondary | ICD-10-CM

## 2020-05-24 DIAGNOSIS — Z171 Estrogen receptor negative status [ER-]: Secondary | ICD-10-CM

## 2020-05-24 DIAGNOSIS — Z5111 Encounter for antineoplastic chemotherapy: Secondary | ICD-10-CM | POA: Diagnosis not present

## 2020-05-24 LAB — CMP (CANCER CENTER ONLY)
ALT: 33 U/L (ref 0–44)
AST: 30 U/L (ref 15–41)
Albumin: 3.6 g/dL (ref 3.5–5.0)
Alkaline Phosphatase: 91 U/L (ref 38–126)
Anion gap: 12 (ref 5–15)
BUN: 5 mg/dL — ABNORMAL LOW (ref 6–20)
CO2: 25 mmol/L (ref 22–32)
Calcium: 9 mg/dL (ref 8.9–10.3)
Chloride: 107 mmol/L (ref 98–111)
Creatinine: 0.71 mg/dL (ref 0.44–1.00)
GFR, Estimated: >60 mL/min (ref >60)
Glucose, Bld: 103 mg/dL — ABNORMAL HIGH (ref 70–99)
Potassium: 3.2 mmol/L — ABNORMAL LOW (ref 3.5–5.1)
Sodium: 144 mmol/L (ref 135–145)
Total Bilirubin: 0.3 mg/dL (ref 0.3–1.2)
Total Protein: 6.9 g/dL (ref 6.5–8.1)

## 2020-05-24 LAB — CBC WITH DIFFERENTIAL (CANCER CENTER ONLY)
Abs Immature Granulocytes: 0.02 10*3/uL (ref 0.00–0.07)
Basophils Absolute: 0 10*3/uL (ref 0.0–0.1)
Basophils Relative: 0 %
Eosinophils Absolute: 0 10*3/uL (ref 0.0–0.5)
Eosinophils Relative: 0 %
HCT: 29.2 % — ABNORMAL LOW (ref 36.0–46.0)
Hemoglobin: 9.7 g/dL — ABNORMAL LOW (ref 12.0–15.0)
Immature Granulocytes: 0 %
Lymphocytes Relative: 22 %
Lymphs Abs: 1.2 10*3/uL (ref 0.7–4.0)
MCH: 26.3 pg (ref 26.0–34.0)
MCHC: 33.2 g/dL (ref 30.0–36.0)
MCV: 79.1 fL — ABNORMAL LOW (ref 80.0–100.0)
Monocytes Absolute: 0.6 10*3/uL (ref 0.1–1.0)
Monocytes Relative: 10 %
Neutro Abs: 3.7 10*3/uL (ref 1.7–7.7)
Neutrophils Relative %: 68 %
Platelet Count: 231 10*3/uL (ref 150–400)
RBC: 3.69 MIL/uL — ABNORMAL LOW (ref 3.87–5.11)
RDW: 20.3 % — ABNORMAL HIGH (ref 11.5–15.5)
WBC Count: 5.5 10*3/uL (ref 4.0–10.5)
nRBC: 0 % (ref 0.0–0.2)

## 2020-05-24 MED ORDER — SODIUM CHLORIDE 0.9 % IV SOLN
Freq: Once | INTRAVENOUS | Status: AC
Start: 2020-05-24 — End: 2020-05-24
  Filled 2020-05-24: qty 250

## 2020-05-24 MED ORDER — DIPHENHYDRAMINE HCL 50 MG/ML IJ SOLN
25.0000 mg | Freq: Once | INTRAMUSCULAR | Status: AC
Start: 2020-05-24 — End: 2020-05-24
  Administered 2020-05-24: 25 mg via INTRAVENOUS

## 2020-05-24 MED ORDER — SODIUM CHLORIDE 0.9 % IV SOLN
80.0000 mg/m2 | Freq: Once | INTRAVENOUS | Status: AC
  Administered 2020-05-24: 168 mg via INTRAVENOUS
  Filled 2020-05-24: qty 28

## 2020-05-24 MED ORDER — DIPHENHYDRAMINE HCL 50 MG/ML IJ SOLN
INTRAMUSCULAR | Status: AC
  Filled 2020-05-24: qty 1

## 2020-05-24 MED ORDER — DEXAMETHASONE SODIUM PHOSPHATE 100 MG/10ML IJ SOLN
10.0000 mg | Freq: Once | INTRAMUSCULAR | Status: AC
  Administered 2020-05-24: 10 mg via INTRAVENOUS
  Filled 2020-05-24: qty 10

## 2020-05-24 MED ORDER — ONDANSETRON HCL 4 MG/2ML IJ SOLN
INTRAMUSCULAR | Status: AC
  Filled 2020-05-24: qty 4

## 2020-05-24 MED ORDER — FAMOTIDINE IN NACL 20-0.9 MG/50ML-% IV SOLN
INTRAVENOUS | Status: AC
  Filled 2020-05-24: qty 50

## 2020-05-24 MED ORDER — SODIUM CHLORIDE 0.9% FLUSH
10.0000 mL | INTRAVENOUS | Status: DC | PRN
  Administered 2020-05-24: 10 mL
  Filled 2020-05-24: qty 10

## 2020-05-24 MED ORDER — HEPARIN SOD (PORK) LOCK FLUSH 100 UNIT/ML IV SOLN
500.0000 [IU] | Freq: Once | INTRAVENOUS | Status: AC | PRN
  Administered 2020-05-24: 500 [IU]
  Filled 2020-05-24: qty 5

## 2020-05-24 MED ORDER — FAMOTIDINE IN NACL 20-0.9 MG/50ML-% IV SOLN
20.0000 mg | Freq: Once | INTRAVENOUS | Status: AC
  Administered 2020-05-24: 20 mg via INTRAVENOUS

## 2020-05-24 MED ORDER — ONDANSETRON HCL 4 MG/2ML IJ SOLN
8.0000 mg | Freq: Once | INTRAMUSCULAR | Status: AC
  Administered 2020-05-24: 8 mg via INTRAVENOUS

## 2020-05-24 NOTE — Patient Instructions (Signed)
Cancer Center Discharge Instructions for Patients Receiving Chemotherapy  Today you received the following chemotherapy agents Paclitaxel  To help prevent nausea and vomiting after your treatment, we encourage you to take your nausea medication as directed   If you develop nausea and vomiting that is not controlled by your nausea medication, call the clinic.   BELOW ARE SYMPTOMS THAT SHOULD BE REPORTED IMMEDIATELY:  *FEVER GREATER THAN 100.5 F  *CHILLS WITH OR WITHOUT FEVER  NAUSEA AND VOMITING THAT IS NOT CONTROLLED WITH YOUR NAUSEA MEDICATION  *UNUSUAL SHORTNESS OF BREATH  *UNUSUAL BRUISING OR BLEEDING  TENDERNESS IN MOUTH AND THROAT WITH OR WITHOUT PRESENCE OF ULCERS  *URINARY PROBLEMS  *BOWEL PROBLEMS  UNUSUAL RASH Items with * indicate a potential emergency and should be followed up as soon as possible.  Feel free to call the clinic should you have any questions or concerns. The clinic phone number is [phone].  Please show the CHEMO ALERT CARD at check-in to the Emergency Department and triage nurse.   

## 2020-05-24 NOTE — Progress Notes (Signed)
Okay to treat with elevated pulse per Dr. [name]

## 2020-05-27 ENCOUNTER — Other Ambulatory Visit: Payer: 59

## 2020-05-29 ENCOUNTER — Encounter: Payer: Self-pay | Admitting: *Deleted

## 2020-05-29 ENCOUNTER — Telehealth: Payer: Self-pay | Admitting: Hematology and Oncology

## 2020-05-29 NOTE — Telephone Encounter (Signed)
Scheduled appt per 4/18 sch msg. Pt aware.

## 2020-05-30 ENCOUNTER — Telehealth (HOSPITAL_COMMUNITY): Payer: Self-pay

## 2020-05-30 NOTE — Telephone Encounter (Signed)
Spoke to the patient, detailed instructions given. She stated that she understood and would be here for her test. Asked to call back with any questions. S.Davanee Klinkner EMTP

## 2020-06-01 ENCOUNTER — Ambulatory Visit (HOSPITAL_BASED_OUTPATIENT_CLINIC_OR_DEPARTMENT_OTHER): Payer: 59

## 2020-06-01 ENCOUNTER — Telehealth: Payer: Self-pay

## 2020-06-01 ENCOUNTER — Other Ambulatory Visit: Payer: Self-pay

## 2020-06-01 ENCOUNTER — Ambulatory Visit (HOSPITAL_COMMUNITY): Payer: 59 | Attending: Cardiology

## 2020-06-01 DIAGNOSIS — E782 Mixed hyperlipidemia: Secondary | ICD-10-CM

## 2020-06-01 DIAGNOSIS — R0609 Other forms of dyspnea: Secondary | ICD-10-CM

## 2020-06-01 DIAGNOSIS — R0600 Dyspnea, unspecified: Secondary | ICD-10-CM

## 2020-06-01 LAB — MYOCARDIAL PERFUSION IMAGING
LV dias vol: 66 mL (ref 46–106)
LV sys vol: 27 mL
Peak HR: 126 {beats}/min
Rest HR: 100 {beats}/min
SDS: 1
SRS: 0
SSS: 1
TID: 1.06

## 2020-06-01 LAB — ECHOCARDIOGRAM COMPLETE
Area-P 1/2: 3.74 cm2
Height: 64 in
S' Lateral: 3.2 cm
Weight: 3472 oz

## 2020-06-01 MED ORDER — TECHNETIUM TC 99M TETROFOSMIN IV KIT
10.3000 | PACK | Freq: Once | INTRAVENOUS | Status: AC | PRN
  Administered 2020-06-01: 10.3 via INTRAVENOUS
  Filled 2020-06-01: qty 11

## 2020-06-01 MED ORDER — TECHNETIUM TC 99M TETROFOSMIN IV KIT
32.5000 | PACK | Freq: Once | INTRAVENOUS | Status: AC | PRN
Start: 2020-06-01 — End: 2020-06-01
  Administered 2020-06-01: 32.5 via INTRAVENOUS
  Filled 2020-06-01: qty 33

## 2020-06-01 MED ORDER — REGADENOSON 0.4 MG/5ML IV SOLN
0.4000 mg | Freq: Once | INTRAVENOUS | Status: AC
  Administered 2020-06-01: 0.4 mg via INTRAVENOUS

## 2020-06-01 NOTE — Telephone Encounter (Signed)
-----   Message from Josue Hector, MD sent at 06/01/2020 12:39 PM EDT ----- Normal echo with good EF and no significant valvular heart disease

## 2020-06-01 NOTE — Telephone Encounter (Signed)
Patient notified and verbalized understanding. Patient had no questions or concerns at this time.  

## 2020-06-02 ENCOUNTER — Inpatient Hospital Stay: Payer: 59

## 2020-06-02 VITALS — BP 144/74 | HR 99 | Temp 98.2°F | Resp 18 | Wt 217.0 lb

## 2020-06-02 DIAGNOSIS — C50411 Malignant neoplasm of upper-outer quadrant of right female breast: Secondary | ICD-10-CM

## 2020-06-02 DIAGNOSIS — Z171 Estrogen receptor negative status [ER-]: Secondary | ICD-10-CM

## 2020-06-02 DIAGNOSIS — Z5111 Encounter for antineoplastic chemotherapy: Secondary | ICD-10-CM | POA: Diagnosis not present

## 2020-06-02 LAB — CBC WITH DIFFERENTIAL (CANCER CENTER ONLY)
Abs Immature Granulocytes: 0.04 10*3/uL (ref 0.00–0.07)
Basophils Absolute: 0 10*3/uL (ref 0.0–0.1)
Basophils Relative: 1 %
Eosinophils Absolute: 0 10*3/uL (ref 0.0–0.5)
Eosinophils Relative: 0 %
HCT: 30.3 % — ABNORMAL LOW (ref 36.0–46.0)
Hemoglobin: 10.1 g/dL — ABNORMAL LOW (ref 12.0–15.0)
Immature Granulocytes: 1 %
Lymphocytes Relative: 20 %
Lymphs Abs: 1.1 10*3/uL (ref 0.7–4.0)
MCH: 27.2 pg (ref 26.0–34.0)
MCHC: 33.3 g/dL (ref 30.0–36.0)
MCV: 81.5 fL (ref 80.0–100.0)
Monocytes Absolute: 0.5 10*3/uL (ref 0.1–1.0)
Monocytes Relative: 10 %
Neutro Abs: 3.9 10*3/uL (ref 1.7–7.7)
Neutrophils Relative %: 68 %
Platelet Count: 182 10*3/uL (ref 150–400)
RBC: 3.72 MIL/uL — ABNORMAL LOW (ref 3.87–5.11)
RDW: 20 % — ABNORMAL HIGH (ref 11.5–15.5)
WBC Count: 5.6 10*3/uL (ref 4.0–10.5)
nRBC: 0 % (ref 0.0–0.2)

## 2020-06-02 LAB — CMP (CANCER CENTER ONLY)
ALT: 28 U/L (ref 0–44)
AST: 26 U/L (ref 15–41)
Albumin: 3.6 g/dL (ref 3.5–5.0)
Alkaline Phosphatase: 92 U/L (ref 38–126)
Anion gap: 11 (ref 5–15)
BUN: 7 mg/dL (ref 6–20)
CO2: 25 mmol/L (ref 22–32)
Calcium: 9 mg/dL (ref 8.9–10.3)
Chloride: 106 mmol/L (ref 98–111)
Creatinine: 0.68 mg/dL (ref 0.44–1.00)
GFR, Estimated: >60 mL/min (ref >60)
Glucose, Bld: 135 mg/dL — ABNORMAL HIGH (ref 70–99)
Potassium: 3.3 mmol/L — ABNORMAL LOW (ref 3.5–5.1)
Sodium: 142 mmol/L (ref 135–145)
Total Bilirubin: 0.4 mg/dL (ref 0.3–1.2)
Total Protein: 6.6 g/dL (ref 6.5–8.1)

## 2020-06-02 MED ORDER — SODIUM CHLORIDE 0.9 % IV SOLN
150.0000 mg | Freq: Once | INTRAVENOUS | Status: AC
  Administered 2020-06-02: 150 mg via INTRAVENOUS
  Filled 2020-06-02: qty 150

## 2020-06-02 MED ORDER — DIPHENHYDRAMINE HCL 50 MG/ML IJ SOLN
25.0000 mg | Freq: Once | INTRAMUSCULAR | Status: AC
Start: 2020-06-02 — End: 2020-06-02
  Administered 2020-06-02: 25 mg via INTRAVENOUS

## 2020-06-02 MED ORDER — SODIUM CHLORIDE 0.9 % IV SOLN
10.0000 mg | Freq: Once | INTRAVENOUS | Status: AC
  Administered 2020-06-02: 10 mg via INTRAVENOUS
  Filled 2020-06-02: qty 10

## 2020-06-02 MED ORDER — SODIUM CHLORIDE 0.9 % IV SOLN
700.0000 mg | Freq: Once | INTRAVENOUS | Status: AC
  Administered 2020-06-02: 700 mg via INTRAVENOUS
  Filled 2020-06-02: qty 70

## 2020-06-02 MED ORDER — DIPHENHYDRAMINE HCL 50 MG/ML IJ SOLN
INTRAMUSCULAR | Status: AC
  Filled 2020-06-02: qty 1

## 2020-06-02 MED ORDER — PALONOSETRON HCL INJECTION 0.25 MG/5ML
INTRAVENOUS | Status: AC
  Filled 2020-06-02: qty 5

## 2020-06-02 MED ORDER — SODIUM CHLORIDE 0.9 % IV SOLN
Freq: Once | INTRAVENOUS | Status: AC
Start: 2020-06-02 — End: 2020-06-02
  Filled 2020-06-02: qty 250

## 2020-06-02 MED ORDER — FAMOTIDINE IN NACL 20-0.9 MG/50ML-% IV SOLN
INTRAVENOUS | Status: AC
  Filled 2020-06-02: qty 50

## 2020-06-02 MED ORDER — FAMOTIDINE IN NACL 20-0.9 MG/50ML-% IV SOLN
20.0000 mg | Freq: Once | INTRAVENOUS | Status: AC
  Administered 2020-06-02: 20 mg via INTRAVENOUS

## 2020-06-02 MED ORDER — SODIUM CHLORIDE 0.9 % IV SOLN
80.0000 mg/m2 | Freq: Once | INTRAVENOUS | Status: AC
  Administered 2020-06-02: 168 mg via INTRAVENOUS
  Filled 2020-06-02: qty 28

## 2020-06-02 MED ORDER — PALONOSETRON HCL INJECTION 0.25 MG/5ML
0.2500 mg | Freq: Once | INTRAVENOUS | Status: AC
  Administered 2020-06-02: 0.25 mg via INTRAVENOUS

## 2020-06-02 NOTE — Patient Instructions (Addendum)
[facility]  Discharge Instructions: Thank you for choosing [facility] to provide your oncology and hematology care.   If you have a lab appointment with the Cancer Center, please go directly to the Cancer Center and check in at the registration area.   Wear comfortable clothing and clothing appropriate for easy access to any Portacath or PICC line.   We strive to give you quality time with your provider. You may need to reschedule your appointment if you arrive late (15 or more minutes).  Arriving late affects you and other patients whose appointments are after yours.  Also, if you miss three or more appointments without notifying the office, you may be dismissed from the clinic at the provider's discretion.      For prescription refill requests, have your pharmacy contact our office and allow 72 hours for refills to be completed.    Today you received the following chemotherapy and/or immunotherapy agents taxol and carboplatin      To help prevent nausea and vomiting after your treatment, we encourage you to take your nausea medication as directed.  BELOW ARE SYMPTOMS THAT SHOULD BE REPORTED IMMEDIATELY: *FEVER GREATER THAN 100.4 F (38 C) OR HIGHER *CHILLS OR SWEATING *NAUSEA AND VOMITING THAT IS NOT CONTROLLED WITH YOUR NAUSEA MEDICATION *UNUSUAL SHORTNESS OF BREATH *UNUSUAL BRUISING OR BLEEDING *URINARY PROBLEMS (pain or burning when urinating, or frequent urination) *BOWEL PROBLEMS (unusual diarrhea, constipation, pain near the anus) TENDERNESS IN MOUTH AND THROAT WITH OR WITHOUT PRESENCE OF ULCERS (sore throat, sores in mouth, or a toothache) UNUSUAL RASH, SWELLING OR PAIN  UNUSUAL VAGINAL DISCHARGE OR ITCHING   Items with * indicate a potential emergency and should be followed up as soon as possible or go to the Emergency Department if any problems should occur.  Please show the CHEMOTHERAPY ALERT CARD or IMMUNOTHERAPY ALERT CARD at  check-in to the Emergency Department and triage nurse.  Should you have questions after your visit or need to cancel or reschedule your appointment, please contact [facility]  Dept: [phone]  and follow the prompts.  Office hours are 8:00 a.m. to 4:30 p.m. Monday - Friday. Please note that voicemails left after 4:00 p.m. may not be returned until the following business day.  We are closed weekends and major holidays. You have access to a nurse at all times for urgent questions. Please call the main number to the clinic Dept: [phone] and follow the prompts.   For any non-urgent questions, you may also contact your provider using MyChart. We now offer e-Visits for anyone 18 and older to request care online for non-urgent symptoms. For details visit [email or web address].   Also download the MyChart app! Go to the app store, search "MyChart", open the app, select [facility], and log in with your MyChart username and password.  Due to Covid, a mask is required upon entering the hospital/clinic. If you do not have a mask, one will be given to you upon arrival. For doctor visits, patients may have 1 support person aged 18 or older with them. For treatment visits, patients cannot have anyone with them due to current Covid guidelines and our immunocompromised population.   

## 2020-06-02 NOTE — Patient Instructions (Signed)
Implanted Port Insertion, Care After This sheet gives you information about how to care for yourself after your procedure. Your health care provider may also give you more specific instructions. If you have problems or questions, contact your health care provider. What can I expect after the procedure? After the procedure, it is common to have:  Discomfort at the port insertion site.  Bruising on the skin over the port. This should improve over 3-4 days. Follow these instructions at home: Port care  After your port is placed, you will get a manufacturer's information card. The card has information about your port. Keep this card with you at all times.  Take care of the port as told by your health care provider. Ask your health care provider if you or a family member can get training for taking care of the port at home. A home health care nurse may also take care of the port.  Make sure to remember what type of port you have. Incision care  Follow instructions from your health care provider about how to take care of your port insertion site. Make sure you: ? Wash your hands with soap and water before and after you change your bandage (dressing). If soap and water are not available, use hand sanitizer. ? Change your dressing as told by your health care provider. ? Leave stitches (sutures), skin glue, or adhesive strips in place. These skin closures may need to stay in place for 2 weeks or longer. If adhesive strip edges start to loosen and curl up, you may trim the loose edges. Do not remove adhesive strips completely unless your health care provider tells you to do that.  Check your port insertion site every day for signs of infection. Check for: ? Redness, swelling, or pain. ? Fluid or blood. ? Warmth. ? Pus or a bad smell.      Activity  Return to your normal activities as told by your health care provider. Ask your health care provider what activities are safe for you.  Do not  lift anything that is heavier than 10 lb (4.5 kg), or the limit that you are told, until your health care provider says that it is safe. General instructions  Take over-the-counter and prescription medicines only as told by your health care provider.  Do not take baths, swim, or use a hot tub until your health care provider approves. Ask your health care provider if you may take showers. You may only be allowed to take sponge baths.  Do not drive for 24 hours if you were given a sedative during your procedure.  Wear a medical alert bracelet in case of an emergency. This will tell any health care providers that you have a port.  Keep all follow-up visits as told by your health care provider. This is important. Contact a health care provider if:  You cannot flush your port with saline as directed, or you cannot draw blood from the port.  You have a fever or chills.  You have redness, swelling, or pain around your port insertion site.  You have fluid or blood coming from your port insertion site.  Your port insertion site feels warm to the touch.  You have pus or a bad smell coming from the port insertion site. Get help right away if:  You have chest pain or shortness of breath.  You have bleeding from your port that you cannot control. Summary  Take care of the port as told by your   health care provider. Keep the manufacturer's information card with you at all times.  Change your dressing as told by your health care provider.  Contact a health care provider if you have a fever or chills or if you have redness, swelling, or pain around your port insertion site.  Keep all follow-up visits as told by your health care provider. This information is not intended to replace advice given to you by your health care provider. Make sure you discuss any questions you have with your health care provider. Document Revised: 08/26/2017 Document Reviewed: 08/26/2017 [employer]vier Patient Education   2021 [employer]  

## 2020-06-08 NOTE — Progress Notes (Signed)
CARDIOLOGY CONSULT NOTE        Virtual Visit via Video Note   This visit type was conducted due to national recommendations for restrictions regarding the COVID-19 Pandemic (e.g. social distancing) in an effort to limit this patient's exposure and mitigate transmission in our community.  Due to her co-morbid illnesses, this patient is at least at moderate risk for complications without adequate follow up.  This format is felt to be most appropriate for this patient at this time.  All issues noted in this document were discussed and addressed.  A limited physical exam was performed with this format.  Please refer to the patient's chart for her consent to telehealth for [facility].   Patient location Home Physician location: Office   Patient ID: Jacqueline Adams MRN: [id number] DOB/AGE: October 23, 1960 60 y.o.  Referring Physician: [name]  Primary Physician: Robyne Peers, MD Primary Cardiologist: Johnsie Cancel F/U Dyspnea   HPI:  60 y.o. referred by Dr [name] Oncology 05/05/20  for dyspnea. History of breast cancer, MS, HLD.  Fairly recent breast cancer diagnosis 01/10/20 Stage 1B Initially Rx with Adriamycin and Cytoxan That's when dyspnea noted and has improved since then  Being Rx Carboplatin and Paclitaxel Since Rx has had fatigue and dyspnea She is on Prednisone and gets interferon injections for MS She had port Placed right chest. CXR 03/02/20 normal   Echo done 01/14/20 EF 55-60% with only relaxation abnormality And GLS -18.1 Normal RV no signs of pulmonary HTN  She did have a cardiopulmonary stress test in 2016 for same symptoms of exertional dyspnea and it was normal limitations from obesity   I take care of her mom Raina Mina who has some CHF and is 30 yo  She has noted increased HR since chemo. She is overweight with MS and some anemia that contribute No chest pain but dyspnea is exertional .    F/U echo done 06/01/20 reviewed:  EF normal 55-60% no change in GLS -18 with no  significant valve dx just trivial MR F/U Lexiscan Myovue reviewed normal no ischemia EF 59%  Last seen by oncology 4/13 and complained of fatigue, diarrhea and nausea   She has a daughter at [employer] and son at [employer] other systems reviewed and negative except as noted above  Past Medical History:  Diagnosis Date  . 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 Bronchitis   . Cervical spondylarthritis   . Headache(784.0)   . High cholesterol   . Leukocytosis 2009  . Multiple sclerosis ([place]) 2009   followed by Dr. Erling Cruz  . Rhinitis   . Seasonal allergies   . Sinusitis     Family History  Problem Relation Age of Onset  . Cancer Mother 74       ovarian cancer; now with suspected breast cancer  . Diabetes Mother        also HTN  . Breast cancer Mother   . Clotting disorder Father   . Prostate cancer Father   . Breast cancer Maternal Aunt   . Cancer Maternal Grandmother        unknown female reproductive organ cancer  . Breast cancer Maternal Aunt   . Breast cancer Maternal Aunt   . Colon cancer Sister 68       anal cancer (2014)  . Lung cancer Sister 64  . Stroke Sister 74  . Ovarian cancer Sister 20  . BRCA 1/2 Sister     Social  History   Socioeconomic History  . Marital status: Married    Spouse name: [name]  . Number of children: 2  . Years of education: College  . Highest education level: Not on file  Occupational History  . Occupation: Print production planner: [employer]  Tobacco Use  . Smoking status: Never Smoker  . Smokeless tobacco: Never Used  Vaping Use  . Vaping Use: Never used  Substance and Sexual Activity  . Alcohol use: No  . Drug use: No  . Sexual activity: Yes    Birth control/protection: Surgical  Other Topics Concern  . Not on file  Social History Narrative   Patient lives at home with her spouse.   Caffeine Use: 2 sodas daily   Social Determinants of Health   Financial Resource Strain: Low Risk   .  Difficulty of Paying Living Expenses: Not hard at all  Food Insecurity: No Food Insecurity  . Worried About Charity fundraiser in the Last Year: Never true  . Ran Out of Food in the Last Year: Never true  Transportation Needs: No Transportation Needs  . Lack of Transportation (Medical): No  . Lack of Transportation (Non-Medical): No  Physical Activity: Not on file  Stress: Not on file  Social Connections: Socially Integrated  . Frequency of Communication with Friends and Family: More than three times a week  . Frequency of Social Gatherings with Friends and Family: Not on file  . Attends Religious Services: More than 4 times per year  . Active Member of Clubs or Organizations: Not on file  . Attends Archivist Meetings: More than 4 times per year  . Marital Status: Married  Human resources officer Violence: Not on file    Past Surgical History:  Procedure Laterality Date  . CESAREAN SECTION     x 3  . CORRECTION HAMMER TOE Bilateral   . PARTIAL HYSTERECTOMY  2006   for uterine fibroid.   [name] PORTACATH PLACEMENT Right 02/22/2020   Procedure: RIGHT SUBCLAVIAN VEIN PORT PLACEMENT;  Surgeon: Donnie Mesa, MD;  Location: [facility];  Service: General;  Laterality: Right;      Current Outpatient Medications:  .  dexamethasone (DECADRON) 4 MG tablet, Take 1 tablet day before chemo and 1 tablet day after chemo with food, Disp: 12 tablet, Rfl: 0 .  fexofenadine (ALLEGRA) 180 MG tablet, Take 180 mg by mouth daily., Disp: , Rfl:  .  fluticasone (FLONASE) 50 MCG/ACT nasal spray, Place 2 sprays into both nostrils daily., Disp: 16 g, Rfl: 2 .  ibuprofen (ADVIL) 400 MG tablet, Take 1 tablet (400 mg total) by mouth every 8 (eight) hours as needed., Disp: 90 tablet, Rfl: 5 .  interferon beta-1a (AVONEX PREFILLED) 30 MCG/0.5ML PSKT injection, INJECT ONE SYRINGE INTRAMUSCULARLY ONCE WEEKLY. REFRIGERATE. PROTECT FROM LIGHT. ALLOW TO WARM TO ROOM TEMPERATURE PRIOR TO USE., Disp: 3  each, Rfl: 3 .  lidocaine-prilocaine (EMLA) cream, Apply to affected area once, Disp: 30 g, Rfl: 3 .  meclizine (ANTIVERT) 25 MG tablet, Take 1 tablet (25 mg total) by mouth 3 (three) times daily as needed for dizziness., Disp: 30 tablet, Rfl: 0 .  metoprolol succinate (TOPROL-XL) 25 MG 24 hr tablet, Take 25 mg by mouth daily., Disp: , Rfl:  .  Multiple Vitamins-Minerals (CENTRUM SILVER ADULT 50+ PO), Take by mouth., Disp: , Rfl:  .  ondansetron (ZOFRAN) 8 MG tablet, Take 1 tablet (8 mg total) by mouth 2 (two) times daily as needed.  Start on the third day after chemotherapy., Disp: 30 tablet, Rfl: 1 .  prochlorperazine (COMPAZINE) 10 MG tablet, Take 1 tablet (10 mg total) by mouth every 6 (six) hours as needed (Nausea or vomiting)., Disp: 30 tablet, Rfl: 1 .  simvastatin (ZOCOR) 20 MG tablet, Take 1 tablet by mouth daily., Disp: , Rfl:  .  VENTOLIN HFA 108 (90 BASE) MCG/ACT inhaler, Inhale 2 puffs into the lungs every 6 (six) hours as needed., Disp: , Rfl:  .  VITAMIN D PO, Take 2 tablets by mouth daily. +calcium, Disp: , Rfl:  .  potassium chloride SA (KLOR-CON) 20 MEQ tablet, Take 1 tablet (20 mEq total) by mouth 2 (two) times daily. (Patient not taking: Reported on 06/16/2020), Disp: 10 tablet, Rfl: 0 .  topiramate (TOPAMAX) 25 MG tablet, Take 25 mg by mouth., Disp: , Rfl:     Physical Exam: Blood pressure 118/80, pulse 98, height 5' 4"  (1.626 m), weight 97.1 kg.   Overweight female JVP not visible Mild tachypnea     Labs:   Lab Results  Component Value Date   WBC 4.2 06/14/2020   HGB 9.6 (L) 06/14/2020   HCT 29.3 (L) 06/14/2020   MCV 81.4 06/14/2020   PLT 162 06/14/2020    Recent Labs  Lab 06/14/20 1326  NA 142  K 3.4*  CL 106  CO2 27  BUN 8  CREATININE 0.65  CALCIUM 9.1  PROT 6.8  BILITOT 0.3  ALKPHOS 102  ALT 26  AST 28  GLUCOSE 93   No results found for: CKTOTAL, CKMB, CKMBINDEX, TROPONINI  Lab Results  Component Value Date   CHOL 194 03/25/2019   Lab  Results  Component Value Date   HDL 43 03/25/2019   Lab Results  Component Value Date   LDLCALC 133 (H) 03/25/2019   Lab Results  Component Value Date   TRIG 99 03/25/2019   Lab Results  Component Value Date   CHOLHDL 4.5 (H) 03/25/2019   No results found for: LDLDIRECT    Radiology: MYOCARDIAL PERFUSION IMAGING  Result Date: 06/01/2020  The left ventricular ejection fraction is normal (55-65%).  Nuclear stress EF: 59%.  There was no ST segment deviation noted during stress.  The study is normal.  This is a low risk study.    ECHOCARDIOGRAM COMPLETE  Result Date: 06/01/2020    ECHOCARDIOGRAM REPORT   Patient Name:   Jacqueline Adams Lovan Date of Exam: 06/01/2020 Medical Rec #:  [id number]          Height:       64.0 in Accession #:    [id number]         Weight:       217.0 lb Date of Birth:  [birth year]         BSA:          2.025 m Patient Age:    60 years           BP:           121/67 mmHg Patient Gender: F                  HR:           102 bpm. [facility] Procedure: 2D Echo, 3D Echo, Cardiac Doppler, Color Doppler and Strain Analysis Indications:    R06.00 Dyspnea  History:        Patient has prior history of Echocardiogram examinations, most  recent 01/14/2020. Signs/Symptoms:Dyspnea; Risk                 Factors:Dyslipidemia and Family History of Coronary Artery                 Disease. Palpitations/Tachycardia, Right Breast Cancer (current                 chemotherapy).  Sonographer:    Deliah Boston RDCS Referring Phys: [place]  1. Left ventricular ejection fraction, by estimation, is 55 to 60%. Left ventricular ejection fraction by 3D volume is 55 %. The left ventricle has normal function. The left ventricle has no regional wall motion abnormalities. Left ventricular diastolic  parameters are consistent with Grade I diastolic dysfunction (impaired relaxation). The average left ventricular global longitudinal strain is 18.0  %. The global longitudinal strain is normal.  2. Right ventricular systolic function is normal. The right ventricular size is normal. There is normal pulmonary artery systolic pressure.  3. The mitral valve is normal in structure. Trivial mitral valve regurgitation. No evidence of mitral stenosis.  4. The aortic valve is tricuspid. Aortic valve regurgitation is not visualized. No aortic stenosis is present.  5. The inferior vena cava is normal in size with greater than 50% respiratory variability, suggesting right atrial pressure of 3 mmHg. Comparison(s): No significant change from prior study. Prior strain was also -18%. FINDINGS  Left Ventricle: Left ventricular ejection fraction, by estimation, is 55 to 60%. Left ventricular ejection fraction by 3D volume is 55 %. The left ventricle has normal function. The left ventricle has no regional wall motion abnormalities. The average left ventricular global longitudinal strain is 18.0 %. The global longitudinal strain is normal. The left ventricular internal cavity size was normal in size. There is no left ventricular hypertrophy. Left ventricular diastolic parameters are consistent with Grade I diastolic dysfunction (impaired relaxation). Right Ventricle: The right ventricular size is normal. No increase in right ventricular wall thickness. Right ventricular systolic function is normal. There is normal pulmonary artery systolic pressure. The tricuspid regurgitant velocity is 2.32 m/s, and  with an assumed right atrial pressure of 3 mmHg, the estimated right ventricular systolic pressure is 46.9 mmHg. Left Atrium: Left atrial size was normal in size. Right Atrium: Right atrial size was normal in size. Pericardium: There is no evidence of pericardial effusion. Mitral Valve: The mitral valve is normal in structure. There is mild thickening of the mitral valve leaflet(s). There is mild calcification of the mitral valve leaflet(s). Trivial mitral valve regurgitation. No  evidence of mitral valve stenosis. Tricuspid Valve: The tricuspid valve is normal in structure. Tricuspid valve regurgitation is trivial. Aortic Valve: The aortic valve is tricuspid. Aortic valve regurgitation is not visualized. No aortic stenosis is present. Pulmonic Valve: The pulmonic valve was normal in structure. Pulmonic valve regurgitation is trivial. Aorta: The aortic root and ascending aorta are structurally normal, with no evidence of dilitation. Venous: The inferior vena cava is normal in size with greater than 50% respiratory variability, suggesting right atrial pressure of 3 mmHg. IAS/Shunts: No atrial level shunt detected by color flow Doppler.  LEFT VENTRICLE PLAX 2D LVIDd:         4.40 cm         Diastology LVIDs:         3.20 cm         LV e' medial:    8.59 cm/s LV PW:         1.30  cm         LV E/e' medial:  6.5 LV IVS:        0.85 cm         LV e' lateral:   8.55 cm/s LVOT diam:     2.40 cm         LV E/e' lateral: 6.5 LV SV:         66 LV SV Index:   33              2D LVOT Area:     4.52 cm        Longitudinal                                Strain                                2D Strain GLS  18.4 %                                (A2C):                                2D Strain GLS  17.5 %                                (A3C):                                2D Strain GLS  18.0 %                                (A4C):                                2D Strain GLS  18.0 %                                Avg:                                 3D Volume EF                                LV 3D EF:    Left                                             ventricular                                             ejection  fraction by                                             3D volume                                             is 55 %.                                 3D Volume EF:                                3D EF:        55 %                                LV EDV:        135 ml                                LV ESV:       61 ml                                LV SV:        74 ml RIGHT VENTRICLE RV S prime:     15.40 cm/s TAPSE (M-mode): 2.1 cm LEFT ATRIUM             Index       RIGHT ATRIUM          Index LA diam:        3.20 cm 1.58 cm/m  RA Area:     9.61 cm LA Vol (A2C):   44.0 ml 21.72 ml/m RA Volume:   16.30 ml 8.05 ml/m LA Vol (A4C):   31.9 ml 15.75 ml/m LA Biplane Vol: 37.9 ml 18.71 ml/m  AORTIC VALVE LVOT Vmax:   90.60 cm/s LVOT Vmean:  51.400 cm/s LVOT VTI:    0.146 m  AORTA Ao Root diam: 3.10 cm Ao Asc diam:  3.40 cm MITRAL VALVE               TRICUSPID VALVE MV Area (PHT): cm         TR Peak grad:   21.5 mmHg MV Decel Time: 203 msec    TR Vmax:        232.00 cm/s MV E velocity: 55.80 cm/s MV A velocity: 65.60 cm/s  SHUNTS MV E/A ratio:  0.85        Systemic VTI:  0.15 m                            Systemic Diam: 2.40 cm Gwyndolyn Kaufman MD Electronically signed by Gwyndolyn Kaufman MD Signature Date/Time: 06/01/2020/12:28:45 PM    Final     EKG: SR rate 109 normal    ASSESSMENT AND PLAN:   1. Dyspnea:  More related to obesity anemia and active Rx for breast cancer. F/u echo  shows normal EF no drop in strain -18 BNP normal 31 05/05/20 Myovue also showed normal EF and no evidence of obstructive CAD  2. MS:  Continue interferon and steroids   3. Breast Cancer: neoajuvant chemo ? To have surgery and XRT still  4. HLD:  Continue statin   F/U with cardiology PRN   Time spent reviewing oncology notes echo myovue labs direct patient interview and composing note 30 minutes   Signed: Jenkins Rouge 06/16/2020, 7:59 AM

## 2020-06-09 ENCOUNTER — Encounter: Payer: Self-pay | Admitting: Hematology and Oncology

## 2020-06-09 ENCOUNTER — Inpatient Hospital Stay: Payer: 59

## 2020-06-09 ENCOUNTER — Other Ambulatory Visit: Payer: Self-pay

## 2020-06-09 VITALS — BP 125/67 | HR 95 | Temp 98.2°F | Resp 18

## 2020-06-09 DIAGNOSIS — Z171 Estrogen receptor negative status [ER-]: Secondary | ICD-10-CM

## 2020-06-09 DIAGNOSIS — Z5111 Encounter for antineoplastic chemotherapy: Secondary | ICD-10-CM | POA: Diagnosis not present

## 2020-06-09 LAB — CMP (CANCER CENTER ONLY)
ALT: 31 U/L (ref 0–44)
AST: 32 U/L (ref 15–41)
Albumin: 3.7 g/dL (ref 3.5–5.0)
Alkaline Phosphatase: 97 U/L (ref 38–126)
Anion gap: 10 (ref 5–15)
BUN: 8 mg/dL (ref 6–20)
CO2: 24 mmol/L (ref 22–32)
Calcium: 9 mg/dL (ref 8.9–10.3)
Chloride: 105 mmol/L (ref 98–111)
Creatinine: 0.65 mg/dL (ref 0.44–1.00)
GFR, Estimated: >60 mL/min (ref >60)
Glucose, Bld: 121 mg/dL — ABNORMAL HIGH (ref 70–99)
Potassium: 3.6 mmol/L (ref 3.5–5.1)
Sodium: 139 mmol/L (ref 135–145)
Total Bilirubin: 0.4 mg/dL (ref 0.3–1.2)
Total Protein: 6.6 g/dL (ref 6.5–8.1)

## 2020-06-09 LAB — CBC WITH DIFFERENTIAL (CANCER CENTER ONLY)
Abs Immature Granulocytes: 0.01 10*3/uL (ref 0.00–0.07)
Basophils Absolute: 0 10*3/uL (ref 0.0–0.1)
Basophils Relative: 1 %
Eosinophils Absolute: 0 10*3/uL (ref 0.0–0.5)
Eosinophils Relative: 1 %
HCT: 29.9 % — ABNORMAL LOW (ref 36.0–46.0)
Hemoglobin: 9.7 g/dL — ABNORMAL LOW (ref 12.0–15.0)
Immature Granulocytes: 0 %
Lymphocytes Relative: 28 %
Lymphs Abs: 0.9 10*3/uL (ref 0.7–4.0)
MCH: 26.1 pg (ref 26.0–34.0)
MCHC: 32.4 g/dL (ref 30.0–36.0)
MCV: 80.4 fL (ref 80.0–100.0)
Monocytes Absolute: 0.3 10*3/uL (ref 0.1–1.0)
Monocytes Relative: 11 %
Neutro Abs: 1.8 10*3/uL (ref 1.7–7.7)
Neutrophils Relative %: 59 %
Platelet Count: 151 10*3/uL (ref 150–400)
RBC: 3.72 MIL/uL — ABNORMAL LOW (ref 3.87–5.11)
RDW: 18.8 % — ABNORMAL HIGH (ref 11.5–15.5)
WBC Count: 3 10*3/uL — ABNORMAL LOW (ref 4.0–10.5)
nRBC: 0 % (ref 0.0–0.2)

## 2020-06-09 MED ORDER — DIPHENHYDRAMINE HCL 50 MG/ML IJ SOLN
25.0000 mg | Freq: Once | INTRAMUSCULAR | Status: AC
  Administered 2020-06-09: 25 mg via INTRAVENOUS

## 2020-06-09 MED ORDER — FAMOTIDINE IN NACL 20-0.9 MG/50ML-% IV SOLN
20.0000 mg | Freq: Once | INTRAVENOUS | Status: AC
  Administered 2020-06-09: 20 mg via INTRAVENOUS

## 2020-06-09 MED ORDER — DIPHENHYDRAMINE HCL 50 MG/ML IJ SOLN
INTRAMUSCULAR | Status: AC
  Filled 2020-06-09: qty 1

## 2020-06-09 MED ORDER — SODIUM CHLORIDE 0.9 % IV SOLN
10.0000 mg | Freq: Once | INTRAVENOUS | Status: AC
  Administered 2020-06-09: 10 mg via INTRAVENOUS
  Filled 2020-06-09: qty 10

## 2020-06-09 MED ORDER — FAMOTIDINE IN NACL 20-0.9 MG/50ML-% IV SOLN
INTRAVENOUS | Status: AC
  Filled 2020-06-09: qty 50

## 2020-06-09 MED ORDER — ONDANSETRON HCL 4 MG/2ML IJ SOLN
INTRAMUSCULAR | Status: AC
  Filled 2020-06-09: qty 4

## 2020-06-09 MED ORDER — SODIUM CHLORIDE 0.9% FLUSH
10.0000 mL | INTRAVENOUS | Status: DC | PRN
  Administered 2020-06-09: 10 mL
  Filled 2020-06-09: qty 10

## 2020-06-09 MED ORDER — SODIUM CHLORIDE 0.9 % IV SOLN: 8.0000 mg | Freq: Once | INTRAVENOUS | Status: DC

## 2020-06-09 MED ORDER — ONDANSETRON HCL 4 MG/2ML IJ SOLN
8.0000 mg | Freq: Once | INTRAMUSCULAR | Status: AC
  Administered 2020-06-09: 8 mg via INTRAVENOUS

## 2020-06-09 MED ORDER — SODIUM CHLORIDE 0.9 % IV SOLN
Freq: Once | INTRAVENOUS | Status: AC
  Filled 2020-06-09: qty 250

## 2020-06-09 MED ORDER — HEPARIN SOD (PORK) LOCK FLUSH 100 UNIT/ML IV SOLN
500.0000 [IU] | Freq: Once | INTRAVENOUS | Status: AC | PRN
  Administered 2020-06-09: 500 [IU]
  Filled 2020-06-09: qty 5

## 2020-06-09 MED ORDER — SODIUM CHLORIDE 0.9 % IV SOLN
80.0000 mg/m2 | Freq: Once | INTRAVENOUS | Status: AC
  Administered 2020-06-09: 168 mg via INTRAVENOUS
  Filled 2020-06-09: qty 28

## 2020-06-09 NOTE — Patient Instructions (Signed)
[facility]  Discharge Instructions: °Thank you for choosing [facility] to provide your oncology and hematology care.  ° °If you have a lab appointment with the Cancer Center, please go directly to the Cancer Center and check in at the registration area. °  °Wear comfortable clothing and clothing appropriate for easy access to any Portacath or PICC line.  ° °We strive to give you quality time with your provider. You may need to reschedule your appointment if you arrive late (15 or more minutes).  Arriving late affects you and other patients whose appointments are after yours.  Also, if you miss three or more appointments without notifying the office, you may be dismissed from the clinic at the provider’s discretion.    °  °For prescription refill requests, have your pharmacy contact our office and allow 72 hours for refills to be completed.   ° °Today you received the following chemotherapy and/or immunotherapy agents: Taxol   °  °To help prevent nausea and vomiting after your treatment, we encourage you to take your nausea medication as directed. ° °BELOW ARE SYMPTOMS THAT SHOULD BE REPORTED IMMEDIATELY: °*FEVER GREATER THAN 100.4 F (38 °C) OR HIGHER °*CHILLS OR SWEATING °*NAUSEA AND VOMITING THAT IS NOT CONTROLLED WITH YOUR NAUSEA MEDICATION °*UNUSUAL SHORTNESS OF BREATH °*UNUSUAL BRUISING OR BLEEDING °*URINARY PROBLEMS (pain or burning when urinating, or frequent urination) °*BOWEL PROBLEMS (unusual diarrhea, constipation, pain near the anus) °TENDERNESS IN MOUTH AND THROAT WITH OR WITHOUT PRESENCE OF ULCERS (sore throat, sores in mouth, or a toothache) °UNUSUAL RASH, SWELLING OR PAIN  °UNUSUAL VAGINAL DISCHARGE OR ITCHING  ° °Items with * indicate a potential emergency and should be followed up as soon as possible or go to the Emergency Department if any problems should occur. ° °Please show the CHEMOTHERAPY ALERT CARD or IMMUNOTHERAPY ALERT CARD at check-in to the  Emergency Department and triage nurse. ° °Should you have questions after your visit or need to cancel or reschedule your appointment, please contact [facility]  Dept: [phone]  and follow the prompts.  Office hours are 8:00 a.m. to 4:30 p.m. Monday - Friday. Please note that voicemails left after 4:00 p.m. may not be returned until the following business day.  We are closed weekends and major holidays. You have access to a nurse at all times for urgent questions. Please call the main number to the clinic Dept: [phone] and follow the prompts. ° ° °For any non-urgent questions, you may also contact your provider using MyChart. We now offer e-Visits for anyone 18 and older to request care online for non-urgent symptoms. For details visit [email or web address]. °  °Also download the MyChart app! Go to the app store, search "MyChart", open the app, select [facility], and log in with your MyChart username and password. ° °Due to Covid, a mask is required upon entering the hospital/clinic. If you do not have a mask, one will be given to you upon arrival. For doctor visits, patients may have 1 support person aged 18 or older with them. For treatment visits, patients cannot have anyone with them due to current Covid guidelines and our immunocompromised population.  ° °

## 2020-06-14 ENCOUNTER — Inpatient Hospital Stay: Payer: 59

## 2020-06-14 ENCOUNTER — Inpatient Hospital Stay: Payer: 59 | Attending: Hematology and Oncology

## 2020-06-14 ENCOUNTER — Other Ambulatory Visit: Payer: Self-pay

## 2020-06-14 VITALS — BP 130/79 | HR 114 | Temp 98.7°F | Resp 18 | Wt 214.8 lb

## 2020-06-14 DIAGNOSIS — C50411 Malignant neoplasm of upper-outer quadrant of right female breast: Secondary | ICD-10-CM | POA: Insufficient documentation

## 2020-06-14 DIAGNOSIS — R0602 Shortness of breath: Secondary | ICD-10-CM | POA: Insufficient documentation

## 2020-06-14 DIAGNOSIS — Z79899 Other long term (current) drug therapy: Secondary | ICD-10-CM | POA: Diagnosis not present

## 2020-06-14 DIAGNOSIS — R000 Tachycardia, unspecified: Secondary | ICD-10-CM | POA: Insufficient documentation

## 2020-06-14 DIAGNOSIS — Z171 Estrogen receptor negative status [ER-]: Secondary | ICD-10-CM | POA: Insufficient documentation

## 2020-06-14 DIAGNOSIS — Z5111 Encounter for antineoplastic chemotherapy: Secondary | ICD-10-CM | POA: Insufficient documentation

## 2020-06-14 LAB — CMP (CANCER CENTER ONLY)
ALT: 26 U/L (ref 0–44)
AST: 28 U/L (ref 15–41)
Albumin: 3.6 g/dL (ref 3.5–5.0)
Alkaline Phosphatase: 102 U/L (ref 38–126)
Anion gap: 9 (ref 5–15)
BUN: 8 mg/dL (ref 6–20)
CO2: 27 mmol/L (ref 22–32)
Calcium: 9.1 mg/dL (ref 8.9–10.3)
Chloride: 106 mmol/L (ref 98–111)
Creatinine: 0.65 mg/dL (ref 0.44–1.00)
GFR, Estimated: >60 mL/min (ref >60)
Glucose, Bld: 93 mg/dL (ref 70–99)
Potassium: 3.4 mmol/L — ABNORMAL LOW (ref 3.5–5.1)
Sodium: 142 mmol/L (ref 135–145)
Total Bilirubin: 0.3 mg/dL (ref 0.3–1.2)
Total Protein: 6.8 g/dL (ref 6.5–8.1)

## 2020-06-14 LAB — CBC WITH DIFFERENTIAL (CANCER CENTER ONLY)
Abs Immature Granulocytes: 0.01 10*3/uL (ref 0.00–0.07)
Basophils Absolute: 0 10*3/uL (ref 0.0–0.1)
Basophils Relative: 0 %
Eosinophils Absolute: 0 10*3/uL (ref 0.0–0.5)
Eosinophils Relative: 1 %
HCT: 29.3 % — ABNORMAL LOW (ref 36.0–46.0)
Hemoglobin: 9.6 g/dL — ABNORMAL LOW (ref 12.0–15.0)
Immature Granulocytes: 0 %
Lymphocytes Relative: 30 %
Lymphs Abs: 1.3 10*3/uL (ref 0.7–4.0)
MCH: 26.7 pg (ref 26.0–34.0)
MCHC: 32.8 g/dL (ref 30.0–36.0)
MCV: 81.4 fL (ref 80.0–100.0)
Monocytes Absolute: 0.4 10*3/uL (ref 0.1–1.0)
Monocytes Relative: 10 %
Neutro Abs: 2.5 10*3/uL (ref 1.7–7.7)
Neutrophils Relative %: 59 %
Platelet Count: 162 10*3/uL (ref 150–400)
RBC: 3.6 MIL/uL — ABNORMAL LOW (ref 3.87–5.11)
RDW: 18.8 % — ABNORMAL HIGH (ref 11.5–15.5)
WBC Count: 4.2 10*3/uL (ref 4.0–10.5)
nRBC: 0 % (ref 0.0–0.2)

## 2020-06-14 MED ORDER — FAMOTIDINE 20 MG IN NS 100 ML IVPB
20.0000 mg | Freq: Once | INTRAVENOUS | Status: AC
  Administered 2020-06-14: 20 mg via INTRAVENOUS

## 2020-06-14 MED ORDER — HEPARIN SOD (PORK) LOCK FLUSH 100 UNIT/ML IV SOLN
500.0000 [IU] | Freq: Once | INTRAVENOUS | Status: AC | PRN
  Administered 2020-06-14: 500 [IU]
  Filled 2020-06-14: qty 5

## 2020-06-14 MED ORDER — SODIUM CHLORIDE 0.9 % IV SOLN
10.0000 mg | Freq: Once | INTRAVENOUS | Status: AC
  Administered 2020-06-14: 10 mg via INTRAVENOUS
  Filled 2020-06-14: qty 10

## 2020-06-14 MED ORDER — DIPHENHYDRAMINE HCL 50 MG/ML IJ SOLN
25.0000 mg | Freq: Once | INTRAMUSCULAR | Status: AC
  Administered 2020-06-14: 25 mg via INTRAVENOUS

## 2020-06-14 MED ORDER — SODIUM CHLORIDE 0.9 % IV SOLN
80.0000 mg/m2 | Freq: Once | INTRAVENOUS | Status: AC
  Administered 2020-06-14: 168 mg via INTRAVENOUS
  Filled 2020-06-14: qty 28

## 2020-06-14 MED ORDER — SODIUM CHLORIDE 0.9 % IV SOLN
Freq: Once | INTRAVENOUS | Status: AC
  Filled 2020-06-14: qty 250

## 2020-06-14 MED ORDER — ONDANSETRON HCL 4 MG/2ML IJ SOLN
8.0000 mg | Freq: Once | INTRAMUSCULAR | Status: AC
  Administered 2020-06-14: 8 mg via INTRAVENOUS

## 2020-06-14 MED ORDER — SODIUM CHLORIDE 0.9% FLUSH
10.0000 mL | INTRAVENOUS | Status: DC | PRN
  Administered 2020-06-14: 10 mL
  Filled 2020-06-14: qty 10

## 2020-06-14 MED ORDER — ONDANSETRON HCL 4 MG/2ML IJ SOLN
INTRAMUSCULAR | Status: AC
  Filled 2020-06-14: qty 4

## 2020-06-14 MED ORDER — FAMOTIDINE 20 MG IN NS 100 ML IVPB
INTRAVENOUS | Status: AC
  Filled 2020-06-14: qty 100

## 2020-06-14 MED ORDER — DIPHENHYDRAMINE HCL 50 MG/ML IJ SOLN
INTRAMUSCULAR | Status: AC
  Filled 2020-06-14: qty 1

## 2020-06-14 NOTE — Patient Instructions (Signed)
[facility]  Discharge Instructions: °Thank you for choosing [facility] to provide your oncology and hematology care.  ° °If you have a lab appointment with the Cancer Center, please go directly to the Cancer Center and check in at the registration area. °  °Wear comfortable clothing and clothing appropriate for easy access to any Portacath or PICC line.  ° °We strive to give you quality time with your provider. You may need to reschedule your appointment if you arrive late (15 or more minutes).  Arriving late affects you and other patients whose appointments are after yours.  Also, if you miss three or more appointments without notifying the office, you may be dismissed from the clinic at the provider’s discretion.    °  °For prescription refill requests, have your pharmacy contact our office and allow 72 hours for refills to be completed.   ° °Today you received the following chemotherapy and/or immunotherapy agents: Taxol   °  °To help prevent nausea and vomiting after your treatment, we encourage you to take your nausea medication as directed. ° °BELOW ARE SYMPTOMS THAT SHOULD BE REPORTED IMMEDIATELY: °*FEVER GREATER THAN 100.4 F (38 °C) OR HIGHER °*CHILLS OR SWEATING °*NAUSEA AND VOMITING THAT IS NOT CONTROLLED WITH YOUR NAUSEA MEDICATION °*UNUSUAL SHORTNESS OF BREATH °*UNUSUAL BRUISING OR BLEEDING °*URINARY PROBLEMS (pain or burning when urinating, or frequent urination) °*BOWEL PROBLEMS (unusual diarrhea, constipation, pain near the anus) °TENDERNESS IN MOUTH AND THROAT WITH OR WITHOUT PRESENCE OF ULCERS (sore throat, sores in mouth, or a toothache) °UNUSUAL RASH, SWELLING OR PAIN  °UNUSUAL VAGINAL DISCHARGE OR ITCHING  ° °Items with * indicate a potential emergency and should be followed up as soon as possible or go to the Emergency Department if any problems should occur. ° °Please show the CHEMOTHERAPY ALERT CARD or IMMUNOTHERAPY ALERT CARD at check-in to the  Emergency Department and triage nurse. ° °Should you have questions after your visit or need to cancel or reschedule your appointment, please contact [facility]  Dept: [phone]  and follow the prompts.  Office hours are 8:00 a.m. to 4:30 p.m. Monday - Friday. Please note that voicemails left after 4:00 p.m. may not be returned until the following business day.  We are closed weekends and major holidays. You have access to a nurse at all times for urgent questions. Please call the main number to the clinic Dept: [phone] and follow the prompts. ° ° °For any non-urgent questions, you may also contact your provider using MyChart. We now offer e-Visits for anyone 18 and older to request care online for non-urgent symptoms. For details visit [email or web address]. °  °Also download the MyChart app! Go to the app store, search "MyChart", open the app, select , and log in with your MyChart username and password. ° °Due to Covid, a mask is required upon entering the hospital/clinic. If you do not have a mask, one will be given to you upon arrival. For doctor visits, patients may have 1 support person aged 18 or older with them. For treatment visits, patients cannot have anyone with them due to current Covid guidelines and our immunocompromised population.  ° °

## 2020-06-14 NOTE — Progress Notes (Signed)
Per Dr. [name], okay to treat with HR 114.

## 2020-06-16 ENCOUNTER — Other Ambulatory Visit: Payer: Self-pay

## 2020-06-16 ENCOUNTER — Telehealth (INDEPENDENT_AMBULATORY_CARE_PROVIDER_SITE_OTHER): Payer: 59 | Admitting: Cardiovascular Disease

## 2020-06-16 VITALS — BP 118/80 | HR 98 | Ht 64.0 in | Wt 214.0 lb

## 2020-06-16 DIAGNOSIS — C50011 Malignant neoplasm of nipple and areola, right female breast: Secondary | ICD-10-CM

## 2020-06-16 DIAGNOSIS — G35 Multiple sclerosis: Secondary | ICD-10-CM

## 2020-06-16 DIAGNOSIS — R0600 Dyspnea, unspecified: Secondary | ICD-10-CM

## 2020-06-16 DIAGNOSIS — R0609 Other forms of dyspnea: Secondary | ICD-10-CM

## 2020-06-16 NOTE — Patient Instructions (Signed)

## 2020-06-20 NOTE — Progress Notes (Signed)
Patient Care Team: Robyne Peers, MD as PCP - General (Family Medicine) Love, Alyson Locket, MD (Neurology) Jerrell Belfast, MD Mauro Kaufmann, RN as Oncology Nurse Navigator Rockwell Germany, RN as Oncology Nurse Navigator Donnie Mesa, MD as Consulting Physician (General Surgery) Nicholas Lose, MD as Consulting Physician (Hematology and Oncology) Kyung Rudd, MD as Consulting Physician (Radiation Oncology)  DIAGNOSIS:    ICD-10-CM   1. Malignant neoplasm of upper-outer quadrant of right breast in female, estrogen receptor negative ([place])  C50.411    Z17.1     SUMMARY OF ONCOLOGIC HISTORY: Oncology History  Malignant neoplasm of upper-outer quadrant of right breast in female, estrogen receptor negative ([place])  01/10/2020 Initial Diagnosis   Palpable right breast mass. Mammogram showed a 1.8cm mass at the 10:30 position 2cm from the nipple, a 1.5cm mass at the 10:30 position 7cm from the nipple, and indeterminate calcifications between the two masses in the right breast. 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    01/12/2020 Cancer Staging   Staging form: Breast, AJCC 8th Edition - Clinical stage from 01/12/2020: Stage IB (cT1c, cN0, cM0, G3, ER-, PR-, HER2-) - Signed by Nicholas Lose, MD on 02/03/2020   02/23/2020 -  Chemotherapy    Patient is on Treatment Plan: BREAST DOSE DENSE AC Q14D / CARBOPLATIN D1 + PACLITAXEL D1,8,15 Q21D        CHIEF COMPLIANT: Cycle10Taxol  INTERVAL HISTORY: Jacqueline Adams is a 61 y.o. with above-mentioned history of BRCA2 mutation and right breast cancer currently on neoadjuvant chemotherapy withweekly Taxol and Carboplatin every 3 weeks. She presents to the clinic todayfora toxicity checkandcycle10.   Her major complaint is infection of both great toenails and Staphylococcus was a culture obtained.  She is currently on Keflex and thinks it is improving slowly with  time.  She was at urgent care on Jun 16, 2020.  Denies any fevers or chills.  Denies any worsening neuropathy.  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  She does get some shooting pains down the legs.  ALLERGIES:  is allergic to procaine and relafen [nabumetone].  MEDICATIONS:  Current Outpatient Medications  Medication Sig Dispense Refill  . cephALEXin (KEFLEX) 500 MG capsule Take 1 capsule (500 mg total) by mouth 4 (four) times daily. 28 capsule 0  . dexamethasone (DECADRON) 4 MG tablet Take 1 tablet day before chemo and 1 tablet day after chemo with food 12 tablet 0  . fexofenadine (ALLEGRA) 180 MG tablet Take 180 mg by mouth daily.    . fluticasone (FLONASE) 50 MCG/ACT nasal spray Place 2 sprays into both nostrils daily. 16 g 2  . ibuprofen (ADVIL) 400 MG tablet Take 1 tablet (400 mg total) by mouth every 8 (eight) hours as needed. 90 tablet 5  . interferon beta-1a (AVONEX PREFILLED) 30 MCG/0.5ML PSKT injection INJECT ONE SYRINGE INTRAMUSCULARLY ONCE WEEKLY. REFRIGERATE. PROTECT FROM LIGHT. ALLOW TO WARM TO ROOM TEMPERATURE PRIOR TO USE. 3 each 3  . lidocaine-prilocaine (EMLA) cream Apply to affected area once 30 g 3  . meclizine (ANTIVERT) 25 MG tablet Take 1 tablet (25 mg total) by mouth 3 (three) times daily as needed for dizziness. 30 tablet 0  . metoprolol succinate (TOPROL-XL) 25 MG 24 hr tablet Take 25 mg by mouth daily.    . Multiple Vitamins-Minerals (CENTRUM SILVER ADULT 50+ PO) Take by mouth.    . ondansetron (ZOFRAN) 8  MG tablet Take 1 tablet (8 mg total) by mouth 2 (two) times daily as needed. Start on the third day after chemotherapy. 30 tablet 1  . potassium chloride SA (KLOR-CON) 20 MEQ tablet Take 1 tablet (20 mEq total) by mouth 2 (two) times daily. (Patient not taking: Reported on 06/16/2020) 10 tablet 0  . prochlorperazine (COMPAZINE) 10 MG tablet Take 1 tablet (10 mg total) by mouth every 6 (six) hours as needed (Nausea or vomiting). 30 tablet 1  .  simvastatin (ZOCOR) 20 MG tablet Take 1 tablet by mouth daily.    . topiramate (TOPAMAX) 25 MG tablet Take 25 mg by mouth.    . VENTOLIN HFA 108 (90 BASE) MCG/ACT inhaler Inhale 2 puffs into the lungs every 6 (six) hours as needed.    . VITAMIN D PO Take 2 tablets by mouth daily. +calcium     No current facility-administered medications for this visit.    PHYSICAL EXAMINATION: ECOG PERFORMANCE STATUS: 1 - Symptomatic but completely ambulatory  Vitals:   06/21/20 0812  BP: 132/74  Pulse: (!) 108  Resp: 16  Temp: (!) 97 F (36.1 C)  SpO2: 99%   Filed Weights   06/21/20 0812  Weight: 213 lb 3.2 oz (96.7 kg)     LABORATORY DATA:  I have reviewed the data as listed CMP Latest Ref Rng & Units 06/14/2020 06/09/2020 06/02/2020  Glucose 70 - 99 mg/dL 93 121(H) 135(H)  BUN 6 - 20 mg/dL 8 8 7  Creatinine 0.44 - 1.00 mg/dL 0.65 0.65 0.68  Sodium 135 - 145 mmol/L 142 139 142  Potassium 3.5 - 5.1 mmol/L 3.4(L) 3.6 3.3(L)  Chloride 98 - 111 mmol/L 106 105 106  CO2 22 - 32 mmol/L 27 24 25  Calcium 8.9 - 10.3 mg/dL 9.1 9.0 9.0  Total Protein 6.5 - 8.1 g/dL 6.8 6.6 6.6  Total Bilirubin 0.3 - 1.2 mg/dL 0.3 0.4 0.4  Alkaline Phos 38 - 126 U/L 102 97 92  AST 15 - 41 U/L 28 32 26  ALT 0 - 44 U/L 26 31 28    Lab Results  Component Value Date   WBC 4.5 06/21/2020   HGB 9.4 (L) 06/21/2020   HCT 28.6 (L) 06/21/2020   MCV 82.7 06/21/2020   PLT 164 06/21/2020   NEUTROABS 2.9 06/21/2020    ASSESSMENT & PLAN:  Malignant neoplasm of upper-outer quadrant of right breast in female, estrogen receptor negative ([facility]) 01/10/2020:Palpable right breast mass. Mammogram showed a 1.8cm mass at the 10:30 position 2cm from the nipple, a 1.5cm mass at the 10:30 position 7cm from the nipple, and indeterminate calcifications between the two masses in the right breast. Biopsy showed IDC with DCIS, grade 2, at both masses, HER-2 negative by FISH (ratio 1.77), ER/PR negative, Ki67  80%. BRCA2Mutation  Treatment plan: 1. Neoadjuvant chemotherapy with Adriamycin and Cytoxan every2weeks4 followed by Taxol weekly 12 with carboplatinand every 3 weeks x4  2. Followed by breast conserving surgery with sentinel lymph node study vsbilateral mastectomies 3. Followed by adjuvant radiation therapyif she undergoes breast conserving surgery 4.Adjuvant olaparib URCC nausea study  Breast MRI12/04/2019: 2 cm spiculated mass UOQ right breast, 2.1 cm spiculated mass UOQ right breast, biopsy-proven malignancies. 7 mm suspicious enhancing mass: Biopsy 02/02/2020: High-grade DCIS ----------------------------------------------------------------------------------------------------------------------------------------------- Current treatment:Completed 4 cycles ofneoadjuvant Adriamycin and Cytoxan, today cycle10Taxol (with carboplatinevery 3 weeks) Echocardiogram 01/14/2020: EF 55 to 60%  Chemo Toxicities: 1.Fatigue 2.shortness of breath to minimal exertion: related to fatigue. 3.Tachycardia: Cardiac evaluation is being   planned 5.  Nausea: Much improved with addition of Zofran 6.  Nailbed infection: After 1 week of Keflex she is looking better.  I will add 1 more week of Keflex antibiotics.  RTCweekly for Taxol  Breast MRI had been ordered for 07/04/2020. Last chemo was on 07/05/2020.  I will discuss results of the MRI.    No orders of the defined types were placed in this encounter.  The patient has a good understanding of the overall plan. she agrees with it. she will call with any problems that may develop before the next visit here.  Total time spent: 30 mins including face to face time and time spent for planning, charting and coordination of care  Vinay K Gudena, MD, MPH 06/21/2020  I, Molly Dorshimer, am acting as scribe for Dr. Vinay Gudena.  I have reviewed the above documentation for accuracy and completeness, and I agree with the  above.       

## 2020-06-20 NOTE — Assessment & Plan Note (Signed)
01/10/2020:Palpable right breast mass. Mammogram showed a 1.8cm mass at the 10:30 position 2cm from the nipple, a 1.5cm mass at the 10:30 position 7cm from the nipple, and indeterminate calcifications between the two masses in the right breast. 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 BRCA2Mutation  Treatment plan: 1. Neoadjuvant chemotherapy with Adriamycin and Cytoxan every2weeks4 followed by Taxol weekly 12 with carboplatinand every 3 weeks x4  2. Followed by breast conserving surgery with sentinel lymph node study vsbilateral mastectomies 3. Followed by adjuvant radiation therapyif she undergoes breast conserving surgery 4.Adjuvant olaparib URCC nausea study  Breast MRI12/04/2019: 2 cm spiculated mass UOQ right breast, 2.1 cm spiculated mass UOQ right breast, biopsy-proven malignancies. 7 mm suspicious enhancing mass: Biopsy 02/02/2020: High-grade DCIS ----------------------------------------------------------------------------------------------------------------------------------------------- Current treatment:Completed 4 cycles ofneoadjuvant Adriamycin and Cytoxan, today cycle8Taxol (with carboplatinevery 3 weeks) Echocardiogram 01/14/2020: EF 55 to 60%  Chemo Toxicities: 1.Fatigue 2.shortness of breath to minimal exertion: related to fatigue. 3.Neutropenia:Reduceddose of chemo with cycle 2. 4.  Tachycardia: Cardiac evaluation is being planned 5.  Nausea: We will add Zofran with each treatment 6.  Diarrhea: Instructed her to take 2 tablets of Imodium with first loose stool.  RTCweekly for Taxol

## 2020-06-21 ENCOUNTER — Other Ambulatory Visit: Payer: Self-pay

## 2020-06-21 ENCOUNTER — Inpatient Hospital Stay: Payer: 59

## 2020-06-21 ENCOUNTER — Inpatient Hospital Stay: Payer: 59 | Admitting: Hematology and Oncology

## 2020-06-21 ENCOUNTER — Encounter: Payer: Self-pay | Admitting: *Deleted

## 2020-06-21 VITALS — HR 99

## 2020-06-21 DIAGNOSIS — C50411 Malignant neoplasm of upper-outer quadrant of right female breast: Secondary | ICD-10-CM

## 2020-06-21 DIAGNOSIS — Z171 Estrogen receptor negative status [ER-]: Secondary | ICD-10-CM

## 2020-06-21 DIAGNOSIS — Z95828 Presence of other vascular implants and grafts: Secondary | ICD-10-CM

## 2020-06-21 LAB — CMP (CANCER CENTER ONLY)
ALT: 35 U/L (ref 0–44)
AST: 35 U/L (ref 15–41)
Albumin: 3.5 g/dL (ref 3.5–5.0)
Alkaline Phosphatase: 82 U/L (ref 38–126)
Anion gap: 12 (ref 5–15)
BUN: 6 mg/dL (ref 6–20)
CO2: 26 mmol/L (ref 22–32)
Calcium: 9.1 mg/dL (ref 8.9–10.3)
Chloride: 104 mmol/L (ref 98–111)
Creatinine: 0.63 mg/dL (ref 0.44–1.00)
GFR, Estimated: >60 mL/min (ref >60)
Glucose, Bld: 109 mg/dL — ABNORMAL HIGH (ref 70–99)
Potassium: 2.9 mmol/L — ABNORMAL LOW (ref 3.5–5.1)
Sodium: 142 mmol/L (ref 135–145)
Total Bilirubin: 0.4 mg/dL (ref 0.3–1.2)
Total Protein: 6.6 g/dL (ref 6.5–8.1)

## 2020-06-21 LAB — CBC WITH DIFFERENTIAL (CANCER CENTER ONLY)
Abs Immature Granulocytes: 0.02 10*3/uL (ref 0.00–0.07)
Basophils Absolute: 0 10*3/uL (ref 0.0–0.1)
Basophils Relative: 0 %
Eosinophils Absolute: 0 10*3/uL (ref 0.0–0.5)
Eosinophils Relative: 0 %
HCT: 28.6 % — ABNORMAL LOW (ref 36.0–46.0)
Hemoglobin: 9.4 g/dL — ABNORMAL LOW (ref 12.0–15.0)
Immature Granulocytes: 0 %
Lymphocytes Relative: 25 %
Lymphs Abs: 1.1 10*3/uL (ref 0.7–4.0)
MCH: 27.2 pg (ref 26.0–34.0)
MCHC: 32.9 g/dL (ref 30.0–36.0)
MCV: 82.7 fL (ref 80.0–100.0)
Monocytes Absolute: 0.4 10*3/uL (ref 0.1–1.0)
Monocytes Relative: 9 %
Neutro Abs: 2.9 10*3/uL (ref 1.7–7.7)
Neutrophils Relative %: 66 %
Platelet Count: 164 10*3/uL (ref 150–400)
RBC: 3.46 MIL/uL — ABNORMAL LOW (ref 3.87–5.11)
RDW: 19 % — ABNORMAL HIGH (ref 11.5–15.5)
WBC Count: 4.5 10*3/uL (ref 4.0–10.5)
nRBC: 0 % (ref 0.0–0.2)

## 2020-06-21 MED ORDER — SODIUM CHLORIDE 0.9 % IV SOLN
600.0000 mg | Freq: Once | INTRAVENOUS | Status: AC
  Administered 2020-06-21: 600 mg via INTRAVENOUS
  Filled 2020-06-21: qty 60

## 2020-06-21 MED ORDER — PALONOSETRON HCL INJECTION 0.25 MG/5ML: 0.2500 mg | Freq: Once | INTRAVENOUS | Status: DC

## 2020-06-21 MED ORDER — DIPHENHYDRAMINE HCL 50 MG/ML IJ SOLN
25.0000 mg | Freq: Once | INTRAMUSCULAR | Status: AC
  Administered 2020-06-21: 25 mg via INTRAVENOUS

## 2020-06-21 MED ORDER — SODIUM CHLORIDE 0.9 % IV SOLN
10.0000 mg | Freq: Once | INTRAVENOUS | Status: AC
  Administered 2020-06-21: 10 mg via INTRAVENOUS
  Filled 2020-06-21: qty 10

## 2020-06-21 MED ORDER — ONDANSETRON HCL 4 MG/2ML IJ SOLN
INTRAMUSCULAR | Status: AC
  Filled 2020-06-21: qty 4

## 2020-06-21 MED ORDER — SODIUM CHLORIDE 0.9 % IV SOLN
150.0000 mg | Freq: Once | INTRAVENOUS | Status: AC
  Administered 2020-06-21: 150 mg via INTRAVENOUS
  Filled 2020-06-21: qty 150

## 2020-06-21 MED ORDER — SODIUM CHLORIDE 0.9 % IV SOLN
65.0000 mg/m2 | Freq: Once | INTRAVENOUS | Status: AC
  Administered 2020-06-21: 138 mg via INTRAVENOUS
  Filled 2020-06-21: qty 23

## 2020-06-21 MED ORDER — HEPARIN SOD (PORK) LOCK FLUSH 100 UNIT/ML IV SOLN
500.0000 [IU] | Freq: Once | INTRAVENOUS | Status: AC | PRN
  Administered 2020-06-21: 500 [IU]
  Filled 2020-06-21: qty 5

## 2020-06-21 MED ORDER — CEPHALEXIN 500 MG PO CAPS: 500.0000 mg | ORAL_CAPSULE | Freq: Four times a day (QID) | ORAL | 0 refills | Status: DC

## 2020-06-21 MED ORDER — FAMOTIDINE 20 MG IN NS 100 ML IVPB
INTRAVENOUS | Status: AC
  Filled 2020-06-21: qty 100

## 2020-06-21 MED ORDER — SODIUM CHLORIDE 0.9 % IV SOLN
Freq: Once | INTRAVENOUS | Status: AC
  Filled 2020-06-21: qty 250

## 2020-06-21 MED ORDER — FAMOTIDINE 20 MG IN NS 100 ML IVPB
20.0000 mg | Freq: Once | INTRAVENOUS | Status: AC
  Administered 2020-06-21: 20 mg via INTRAVENOUS

## 2020-06-21 MED ORDER — DIPHENHYDRAMINE HCL 50 MG/ML IJ SOLN
INTRAMUSCULAR | Status: AC
  Filled 2020-06-21: qty 1

## 2020-06-21 MED ORDER — ONDANSETRON HCL 4 MG/2ML IJ SOLN
8.0000 mg | Freq: Once | INTRAMUSCULAR | Status: AC
Start: 2020-06-21 — End: 2020-06-21
  Administered 2020-06-21: 8 mg via INTRAVENOUS

## 2020-06-21 MED ORDER — SODIUM CHLORIDE 0.9% FLUSH
10.0000 mL | INTRAVENOUS | Status: DC | PRN
  Administered 2020-06-21: 10 mL
  Filled 2020-06-21: qty 10

## 2020-06-21 MED ORDER — SODIUM CHLORIDE 0.9% FLUSH
10.0000 mL | Freq: Once | INTRAVENOUS | Status: AC
  Administered 2020-06-21: 10 mL
  Filled 2020-06-21: qty 10

## 2020-06-21 NOTE — Patient Instructions (Signed)
[facility]  Discharge Instructions: Thank you for choosing [facility] to provide your oncology and hematology care.   If you have a lab appointment with the Cancer Center, please go directly to the Cancer Center and check in at the registration area.   Wear comfortable clothing and clothing appropriate for easy access to any Portacath or PICC line.   We strive to give you quality time with your provider. You may need to reschedule your appointment if you arrive late (15 or more minutes).  Arriving late affects you and other patients whose appointments are after yours.  Also, if you miss three or more appointments without notifying the office, you may be dismissed from the clinic at the provider's discretion.      For prescription refill requests, have your pharmacy contact our office and allow 72 hours for refills to be completed.    Today you received the following chemotherapy and/or immunotherapy agents: Taxol & Carboplatin   To help prevent nausea and vomiting after your treatment, we encourage you to take your nausea medication as directed.  BELOW ARE SYMPTOMS THAT SHOULD BE REPORTED IMMEDIATELY: *FEVER GREATER THAN 100.4 F (38 C) OR HIGHER *CHILLS OR SWEATING *NAUSEA AND VOMITING THAT IS NOT CONTROLLED WITH YOUR NAUSEA MEDICATION *UNUSUAL SHORTNESS OF BREATH *UNUSUAL BRUISING OR BLEEDING *URINARY PROBLEMS (pain or burning when urinating, or frequent urination) *BOWEL PROBLEMS (unusual diarrhea, constipation, pain near the anus) TENDERNESS IN MOUTH AND THROAT WITH OR WITHOUT PRESENCE OF ULCERS (sore throat, sores in mouth, or a toothache) UNUSUAL RASH, SWELLING OR PAIN  UNUSUAL VAGINAL DISCHARGE OR ITCHING   Items with * indicate a potential emergency and should be followed up as soon as possible or go to the Emergency Department if any problems should occur.  Please show the CHEMOTHERAPY ALERT CARD or IMMUNOTHERAPY ALERT CARD at  check-in to the Emergency Department and triage nurse.  Should you have questions after your visit or need to cancel or reschedule your appointment, please contact [facility]  Dept: [phone]  and follow the prompts.  Office hours are 8:00 a.m. to 4:30 p.m. Monday - Friday. Please note that voicemails left after 4:00 p.m. may not be returned until the following business day.  We are closed weekends and major holidays. You have access to a nurse at all times for urgent questions. Please call the main number to the clinic Dept: [phone] and follow the prompts.   For any non-urgent questions, you may also contact your provider using MyChart. We now offer e-Visits for anyone 18 and older to request care online for non-urgent symptoms. For details visit [email or web address].   Also download the MyChart app! Go to the app store, search "MyChart", open the app, select [facility], and log in with your MyChart username and password.  Due to Covid, a mask is required upon entering the hospital/clinic. If you do not have a mask, one will be given to you upon arrival. For doctor visits, patients may have 1 support person aged 18 or older with them. For treatment visits, patients cannot have anyone with them due to current Covid guidelines and our immunocompromised population.   

## 2020-06-21 NOTE — Patient Instructions (Signed)
Implanted Port Insertion, Care After This sheet gives you information about how to care for yourself after your procedure. Your health care provider may also give you more specific instructions. If you have problems or questions, contact your health care provider. What can I expect after the procedure? After the procedure, it is common to have:  Discomfort at the port insertion site.  Bruising on the skin over the port. This should improve over 3-4 days. Follow these instructions at home: Port care  After your port is placed, you will get a manufacturer's information card. The card has information about your port. Keep this card with you at all times.  Take care of the port as told by your health care provider. Ask your health care provider if you or a family member can get training for taking care of the port at home. A home health care nurse may also take care of the port.  Make sure to remember what type of port you have. Incision care  Follow instructions from your health care provider about how to take care of your port insertion site. Make sure you: ? Wash your hands with soap and water before and after you change your bandage (dressing). If soap and water are not available, use hand sanitizer. ? Change your dressing as told by your health care provider. ? Leave stitches (sutures), skin glue, or adhesive strips in place. These skin closures may need to stay in place for 2 weeks or longer. If adhesive strip edges start to loosen and curl up, you may trim the loose edges. Do not remove adhesive strips completely unless your health care provider tells you to do that.  Check your port insertion site every day for signs of infection. Check for: ? Redness, swelling, or pain. ? Fluid or blood. ? Warmth. ? Pus or a bad smell.      Activity  Return to your normal activities as told by your health care provider. Ask your health care provider what activities are safe for you.  Do not  lift anything that is heavier than 10 lb (4.5 kg), or the limit that you are told, until your health care provider says that it is safe. General instructions  Take over-the-counter and prescription medicines only as told by your health care provider.  Do not take baths, swim, or use a hot tub until your health care provider approves. Ask your health care provider if you may take showers. You may only be allowed to take sponge baths.  Do not drive for 24 hours if you were given a sedative during your procedure.  Wear a medical alert bracelet in case of an emergency. This will tell any health care providers that you have a port.  Keep all follow-up visits as told by your health care provider. This is important. Contact a health care provider if:  You cannot flush your port with saline as directed, or you cannot draw blood from the port.  You have a fever or chills.  You have redness, swelling, or pain around your port insertion site.  You have fluid or blood coming from your port insertion site.  Your port insertion site feels warm to the touch.  You have pus or a bad smell coming from the port insertion site. Get help right away if:  You have chest pain or shortness of breath.  You have bleeding from your port that you cannot control. Summary  Take care of the port as told by your   health care provider. Keep the manufacturer's information card with you at all times.  Change your dressing as told by your health care provider.  Contact a health care provider if you have a fever or chills or if you have redness, swelling, or pain around your port insertion site.  Keep all follow-up visits as told by your health care provider. This information is not intended to replace advice given to you by your health care provider. Make sure you discuss any questions you have with your health care provider. Document Revised: 08/26/2017 Document Reviewed: 08/26/2017 [employer]vier Patient Education   2021 [employer]  

## 2020-06-26 ENCOUNTER — Other Ambulatory Visit: Payer: Self-pay | Admitting: General Surgery

## 2020-06-26 DIAGNOSIS — C50411 Malignant neoplasm of upper-outer quadrant of right female breast: Secondary | ICD-10-CM

## 2020-06-26 DIAGNOSIS — Z170 Estrogen receptor positive status [ER+]: Secondary | ICD-10-CM

## 2020-06-28 ENCOUNTER — Inpatient Hospital Stay: Payer: 59

## 2020-06-28 ENCOUNTER — Other Ambulatory Visit: Payer: Self-pay

## 2020-06-28 ENCOUNTER — Other Ambulatory Visit: Payer: Self-pay | Admitting: *Deleted

## 2020-06-28 VITALS — BP 119/76 | HR 98 | Temp 98.4°F | Resp 16 | Wt 213.8 lb

## 2020-06-28 DIAGNOSIS — Z171 Estrogen receptor negative status [ER-]: Secondary | ICD-10-CM

## 2020-06-28 DIAGNOSIS — C50411 Malignant neoplasm of upper-outer quadrant of right female breast: Secondary | ICD-10-CM | POA: Diagnosis not present

## 2020-06-28 DIAGNOSIS — Z95828 Presence of other vascular implants and grafts: Secondary | ICD-10-CM

## 2020-06-28 LAB — CBC WITH DIFFERENTIAL (CANCER CENTER ONLY)
Abs Immature Granulocytes: 0.02 10*3/uL (ref 0.00–0.07)
Basophils Absolute: 0 10*3/uL (ref 0.0–0.1)
Basophils Relative: 0 %
Eosinophils Absolute: 0 10*3/uL (ref 0.0–0.5)
Eosinophils Relative: 0 %
HCT: 27.1 % — ABNORMAL LOW (ref 36.0–46.0)
Hemoglobin: 9 g/dL — ABNORMAL LOW (ref 12.0–15.0)
Immature Granulocytes: 1 %
Lymphocytes Relative: 32 %
Lymphs Abs: 1 10*3/uL (ref 0.7–4.0)
MCH: 27.5 pg (ref 26.0–34.0)
MCHC: 33.2 g/dL (ref 30.0–36.0)
MCV: 82.9 fL (ref 80.0–100.0)
Monocytes Absolute: 0.3 10*3/uL (ref 0.1–1.0)
Monocytes Relative: 9 %
Neutro Abs: 1.8 10*3/uL (ref 1.7–7.7)
Neutrophils Relative %: 58 %
Platelet Count: 110 10*3/uL — ABNORMAL LOW (ref 150–400)
RBC: 3.27 MIL/uL — ABNORMAL LOW (ref 3.87–5.11)
RDW: 18.1 % — ABNORMAL HIGH (ref 11.5–15.5)
WBC Count: 3.2 10*3/uL — ABNORMAL LOW (ref 4.0–10.5)
nRBC: 0 % (ref 0.0–0.2)

## 2020-06-28 LAB — CMP (CANCER CENTER ONLY)
ALT: 19 U/L (ref 0–44)
AST: 25 U/L (ref 15–41)
Albumin: 3.4 g/dL — ABNORMAL LOW (ref 3.5–5.0)
Alkaline Phosphatase: 85 U/L (ref 38–126)
Anion gap: 11 (ref 5–15)
BUN: 4 mg/dL — ABNORMAL LOW (ref 6–20)
CO2: 26 mmol/L (ref 22–32)
Calcium: 8.8 mg/dL — ABNORMAL LOW (ref 8.9–10.3)
Chloride: 104 mmol/L (ref 98–111)
Creatinine: 0.65 mg/dL (ref 0.44–1.00)
GFR, Estimated: >60 mL/min (ref >60)
Glucose, Bld: 114 mg/dL — ABNORMAL HIGH (ref 70–99)
Potassium: 2.8 mmol/L — ABNORMAL LOW (ref 3.5–5.1)
Sodium: 141 mmol/L (ref 135–145)
Total Bilirubin: 0.3 mg/dL (ref 0.3–1.2)
Total Protein: 6.5 g/dL (ref 6.5–8.1)

## 2020-06-28 MED ORDER — FAMOTIDINE 20 MG IN NS 100 ML IVPB
20.0000 mg | Freq: Once | INTRAVENOUS | Status: AC
  Administered 2020-06-28: 20 mg via INTRAVENOUS

## 2020-06-28 MED ORDER — DIPHENHYDRAMINE HCL 50 MG/ML IJ SOLN
25.0000 mg | Freq: Once | INTRAMUSCULAR | Status: AC
  Administered 2020-06-28: 25 mg via INTRAVENOUS

## 2020-06-28 MED ORDER — SODIUM CHLORIDE 0.9 % IV SOLN
65.0000 mg/m2 | Freq: Once | INTRAVENOUS | Status: AC
  Administered 2020-06-28: 138 mg via INTRAVENOUS
  Filled 2020-06-28: qty 23

## 2020-06-28 MED ORDER — POTASSIUM CHLORIDE 10 MEQ/100ML IV SOLN
10.0000 meq | Freq: Once | INTRAVENOUS | Status: AC
  Administered 2020-06-28: 10 meq via INTRAVENOUS

## 2020-06-28 MED ORDER — SODIUM CHLORIDE 0.9% FLUSH
10.0000 mL | INTRAVENOUS | Status: DC | PRN
  Administered 2020-06-28: 10 mL
  Filled 2020-06-28: qty 10

## 2020-06-28 MED ORDER — DIPHENHYDRAMINE HCL 50 MG/ML IJ SOLN
INTRAMUSCULAR | Status: AC
  Filled 2020-06-28: qty 1

## 2020-06-28 MED ORDER — SODIUM CHLORIDE 0.9 % IV SOLN
Freq: Once | INTRAVENOUS | Status: AC
  Filled 2020-06-28: qty 250

## 2020-06-28 MED ORDER — ONDANSETRON HCL 4 MG/2ML IJ SOLN
INTRAMUSCULAR | Status: AC
  Filled 2020-06-28: qty 4

## 2020-06-28 MED ORDER — POTASSIUM CHLORIDE 10 MEQ/100ML IV SOLN
INTRAVENOUS | Status: AC
  Filled 2020-06-28: qty 100

## 2020-06-28 MED ORDER — POTASSIUM CHLORIDE CRYS ER 20 MEQ PO TBCR
20.0000 meq | EXTENDED_RELEASE_TABLET | Freq: Every day | ORAL | 0 refills | Status: DC
Start: 2020-06-28 — End: 2020-07-20

## 2020-06-28 MED ORDER — SODIUM CHLORIDE 0.9% FLUSH
10.0000 mL | Freq: Once | INTRAVENOUS | Status: AC
  Administered 2020-06-28: 10 mL
  Filled 2020-06-28: qty 10

## 2020-06-28 MED ORDER — ONDANSETRON HCL 4 MG/2ML IJ SOLN
8.0000 mg | Freq: Once | INTRAMUSCULAR | Status: AC
  Administered 2020-06-28: 8 mg via INTRAVENOUS

## 2020-06-28 MED ORDER — HEPARIN SOD (PORK) LOCK FLUSH 100 UNIT/ML IV SOLN
500.0000 [IU] | Freq: Once | INTRAVENOUS | Status: AC | PRN
  Administered 2020-06-28: 500 [IU]
  Filled 2020-06-28: qty 5

## 2020-06-28 MED ORDER — FAMOTIDINE 20 MG IN NS 100 ML IVPB
INTRAVENOUS | Status: AC
  Filled 2020-06-28: qty 100

## 2020-06-28 MED ORDER — SODIUM CHLORIDE 0.9 % IV SOLN
10.0000 mg | Freq: Once | INTRAVENOUS | Status: AC
  Administered 2020-06-28: 10 mg via INTRAVENOUS
  Filled 2020-06-28: qty 10

## 2020-06-28 NOTE — Patient Instructions (Signed)
[facility] MEDICAL [facility]COLOG[facility]  Discharge Instructions: Thank you for choosing [facility] to provide your oncology and hematology care.   If you have a lab appointment with the Cancer Center, please go directly to the Cancer Center and check in at the registration area.   Wear comfortable clothing and clothing appropriate for easy access to any Portacath or PICC line.   We strive to give you quality time with your provider. You may need to reschedule your appointment if you arrive late (15 or more minutes).  Arriving late affects you and other patients whose appointments are after yours.  Also, if you miss three or more appointments without notifying the office, you may be dismissed from the clinic at the provider's discretion.      For prescription refill requests, have your pharmacy contact our office and allow 72 hours for refills to be completed.    Today you received the following chemotherapy and/or immunotherapy agents Taxol     To help prevent nausea and vomiting after your treatment, we encourage you to take your nausea medication as directed.  BELOW ARE SYMPTOMS THAT SHOULD BE REPORTED IMMEDIATELY: . *FEVER GREATER THAN 100.4 F (38 C) OR HIGHER . *CHILLS OR SWEATING . *NAUSEA AND VOMITING THAT IS NOT CONTROLLED WITH YOUR NAUSEA MEDICATION . *UNUSUAL SHORTNESS OF BREATH . *UNUSUAL BRUISING OR BLEEDING . *URINARY PROBLEMS (pain or burning when urinating, or frequent urination) . *BOWEL PROBLEMS (unusual diarrhea, constipation, pain near the anus) . TENDERNESS IN MOUTH AND THROAT WITH OR WITHOUT PRESENCE OF ULCERS (sore throat, sores in mouth, or a toothache) . UNUSUAL RASH, SWELLING OR PAIN  . UNUSUAL VAGINAL DISCHARGE OR ITCHING   Items with * indicate a potential emergency and should be followed up as soon as possible or go to the Emergency Department if any problems should occur.  Please show the CHEMOTHERAPY ALERT CARD or IMMUNOTHERAPY ALERT CARD  at check-in to the Emergency Department and triage nurse.  Should you have questions after your visit or need to cancel or reschedule your appointment, please contact [facility]  Dept: [phone]  and follow the prompts.  Office hours are 8:00 a.m. to 4:30 p.m. Monday - Friday. Please note that voicemails left after 4:00 p.m. may not be returned until the following business day.  We are closed weekends and major holidays. You have access to a nurse at all times for urgent questions. Please call the main number to the clinic Dept: [phone] and follow the prompts.   For any non-urgent questions, you may also contact your provider using MyChart. We now offer e-Visits for anyone 18 and older to request care online for non-urgent symptoms. For details visit [email or web address].   Also download the MyChart app! Go to the app store, search "MyChart", open the app, select , and log in with your MyChart username and password.  Due to Covid, a mask is required upon entering the hospital/clinic. If you do not have a mask, one will be given to you upon arrival. For doctor visits, patients may have 1 support person aged 18 or older with them. For treatment visits, patients cannot have anyone with them due to current Covid guidelines and our immunocompromised population.   Paclitaxel injection What is this medicine? PACLITAXEL (PAK li TAX el) is a chemotherapy drug. It targets fast dividing cells, like cancer cells, and causes these cells to die. This medicine is used to treat ovarian cancer, breast cancer, lung   sarcoma, and other cancers. This medicine may be used for other purposes; ask your health care provider or pharmacist if you have questions. COMMON BRAND NAME(S): Onxol, Taxol What should I tell my health care provider before I take this medicine? They need to know if you have any of these conditions:  history of irregular  heartbeat  liver disease  low blood counts, like low white cell, platelet, or red cell counts  lung or breathing disease, like asthma  tingling of the fingers or toes, or other nerve disorder  an unusual or allergic reaction to paclitaxel, alcohol, polyoxyethylated castor oil, other chemotherapy, other medicines, foods, dyes, or preservatives  pregnant or trying to get pregnant  breast-feeding How should I use this medicine? This drug is given as an infusion into a vein. It is administered in a hospital or clinic by a specially trained health care professional. Talk to your pediatrician regarding the use of this medicine in children. Special care may be needed. Overdosage: If you think you have taken too much of this medicine contact a poison control center or emergency room at once. NOTE: This medicine is only for you. Do not share this medicine with others. What if I miss a dose? It is important not to miss your dose. Call your doctor or health care professional if you are unable to keep an appointment. What may interact with this medicine? Do not take this medicine with any of the following medications:  live virus vaccines This medicine may also interact with the following medications:  antiviral medicines for hepatitis, HIV or AIDS  certain antibiotics like erythromycin and clarithromycin  certain medicines for fungal infections like ketoconazole and itraconazole  certain medicines for seizures like carbamazepine, phenobarbital, phenytoin  gemfibrozil  nefazodone  rifampin  St. John's wort This list may not describe all possible interactions. Give your health care provider a list of all the medicines, herbs, non-prescription drugs, or dietary supplements you use. Also tell them if you smoke, drink alcohol, or use illegal drugs. Some items may interact with your medicine. What should I watch for while using this medicine? Your condition will be monitored carefully  while you are receiving this medicine. You will need important blood work done while you are taking this medicine. This medicine can cause serious allergic reactions. To reduce your risk you will need to take other medicine(s) before treatment with this medicine. If you experience allergic reactions like skin rash, itching or hives, swelling of the face, lips, or tongue, tell your doctor or health care professional right away. In some cases, you may be given additional medicines to help with side effects. Follow all directions for their use. This drug may make you feel generally unwell. This is not uncommon, as chemotherapy can affect healthy cells as well as cancer cells. Report any side effects. Continue your course of treatment even though you feel ill unless your doctor tells you to stop. Call your doctor or health care professional for advice if you get a fever, chills or sore throat, or other symptoms of a cold or flu. Do not treat yourself. This drug decreases your body's ability to fight infections. Try to avoid being around people who are sick. This medicine may increase your risk to bruise or bleed. Call your doctor or health care professional if you notice any unusual bleeding. Be careful brushing and flossing your teeth or using a toothpick because you may get an infection or bleed more easily. If you have any dental  any dental work done, tell your dentist you are receiving this medicine. Avoid taking products that contain aspirin, acetaminophen, ibuprofen, naproxen, or ketoprofen unless instructed by your doctor. These medicines may hide a fever. Do not become pregnant while taking this medicine. Women should inform their doctor if they wish to become pregnant or think they might be pregnant. There is a potential for serious side effects to an unborn child. Talk to your health care professional or pharmacist for more information. Do not breast-feed an infant while taking this medicine. Men are advised not  to father a child while receiving this medicine. This product may contain alcohol. Ask your pharmacist or healthcare provider if this medicine contains alcohol. Be sure to tell all healthcare providers you are taking this medicine. Certain medicines, like metronidazole and disulfiram, can cause an unpleasant reaction when taken with alcohol. The reaction includes flushing, headache, nausea, vomiting, sweating, and increased thirst. The reaction can last from 30 minutes to several hours. What side effects may I notice from receiving this medicine? Side effects that you should report to your doctor or health care professional as soon as possible:  allergic reactions like skin rash, itching or hives, swelling of the face, lips, or tongue  breathing problems  changes in vision  fast, irregular heartbeat  high or low blood pressure  mouth sores  pain, tingling, numbness in the hands or feet  signs of decreased platelets or bleeding - bruising, pinpoint red spots on the skin, black, tarry stools, blood in the urine  signs of decreased red blood cells - unusually weak or tired, feeling faint or lightheaded, falls  signs of infection - fever or chills, cough, sore throat, pain or difficulty passing urine  signs and symptoms of liver injury like dark yellow or brown urine; general ill feeling or flu-like symptoms; light-colored stools; loss of appetite; nausea; right upper belly pain; unusually weak or tired; yellowing of the eyes or skin  swelling of the ankles, feet, hands  unusually slow heartbeat Side effects that usually do not require medical attention (report to your doctor or health care professional if they continue or are bothersome):  diarrhea  hair loss  loss of appetite  muscle or joint pain  nausea, vomiting  pain, redness, or irritation at site where injected  tiredness This list may not describe all possible side effects. Call your doctor for medical advice about  side effects. You may report side effects to FDA at 1-800-FDA-1088. Where should I keep my medicine? This drug is given in a hospital or clinic and will not be stored at home. NOTE: This sheet is a summary. It may not cover all possible information. If you have questions about this medicine, talk to your doctor, pharmacist, or health care provider.  2021 Elsevier/Gold Standard (2018-12-30 13:37:23)   

## 2020-06-28 NOTE — Progress Notes (Signed)
[name] 2.8.  Per Dr. Jana Hakim pt to receive Potassium 10 mEq daily today with infusion.  Pt to also be prescribed potassium 20 mEq p.o daily at home.  Prescription sent to pharmacy on file and pt educated on administration.

## 2020-07-04 ENCOUNTER — Other Ambulatory Visit: Payer: Self-pay

## 2020-07-04 ENCOUNTER — Ambulatory Visit
Admission: RE | Admit: 2020-07-04 | Discharge: 2020-07-04 | Disposition: A | Discharge status: Home / Self Care | Payer: 59 | Source: Ambulatory Visit | Attending: General Surgery | Admitting: General Surgery

## 2020-07-04 ENCOUNTER — Encounter: Payer: Self-pay | Admitting: Hematology and Oncology

## 2020-07-04 ENCOUNTER — Other Ambulatory Visit: Payer: Self-pay | Admitting: General Surgery

## 2020-07-04 DIAGNOSIS — C50411 Malignant neoplasm of upper-outer quadrant of right female breast: Secondary | ICD-10-CM

## 2020-07-04 DIAGNOSIS — C50911 Malignant neoplasm of unspecified site of right female breast: Secondary | ICD-10-CM

## 2020-07-04 MED ORDER — GADOBUTROL 1 MMOL/ML IV SOLN
10.0000 mL | Freq: Once | INTRAVENOUS | Status: AC | PRN
  Administered 2020-07-04: 10 mL via INTRAVENOUS

## 2020-07-04 NOTE — Progress Notes (Signed)
Patient Care Team: Robyne Peers, MD as PCP - General (Family Medicine) Love, Alyson Locket, MD (Neurology) Jerrell Belfast, MD Mauro Kaufmann, RN as Oncology Nurse Navigator Rockwell Germany, RN as Oncology Nurse Navigator Donnie Mesa, MD as Consulting Physician (General Surgery) Nicholas Lose, MD as Consulting Physician (Hematology and Oncology) Kyung Rudd, MD as Consulting Physician (Radiation Oncology)  DIAGNOSIS:    ICD-10-CM   1. Malignant neoplasm of upper-outer quadrant of right breast in female, estrogen receptor negative ([place])  C50.411    Z17.1     SUMMARY OF ONCOLOGIC HISTORY: Oncology History  Malignant neoplasm of upper-outer quadrant of right breast in female, estrogen receptor negative ([place])  01/10/2020 Initial Diagnosis   Palpable right breast mass. Mammogram showed a 1.8cm mass at the 10:30 position 2cm from the nipple, a 1.5cm mass at the 10:30 position 7cm from the nipple, and indeterminate calcifications between the two masses in the right breast. 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    01/12/2020 Cancer Staging   Staging form: Breast, AJCC 8th Edition - Clinical stage from 01/12/2020: Stage IB (cT1c, cN0, cM0, G3, ER-, PR-, HER2-) - Signed by Nicholas Lose, MD on 02/03/2020   02/23/2020 -  Chemotherapy    Patient is on Treatment Plan: BREAST DOSE DENSE AC Q14D / CARBOPLATIN D1 + PACLITAXEL D1,8,15 Q21D        CHIEF COMPLIANT: Cycle12Taxol  INTERVAL HISTORY: Jacqueline Adams is a 60 y.o. with above-mentioned history of BRCA2 mutation and right breast cancer currently on neoadjuvant chemotherapy withweekly Taxol and Carboplatin every 3 weeks.Breast MRI on 07/04/20 showed treatment response with no residual mass or enhancement. She presents to the clinic todayfora toxicity checkandcycle12.  Very mild intermittent numbness  ALLERGIES:  is allergic to procaine and  relafen [nabumetone].  MEDICATIONS:  Current Outpatient Medications  Medication Sig Dispense Refill  . cephALEXin (KEFLEX) 500 MG capsule Take 1 capsule (500 mg total) by mouth 4 (four) times daily. 28 capsule 0  . dexamethasone (DECADRON) 4 MG tablet Take 1 tablet day before chemo and 1 tablet day after chemo with food 12 tablet 0  . fexofenadine (ALLEGRA) 180 MG tablet Take 180 mg by mouth daily.    . fluticasone (FLONASE) 50 MCG/ACT nasal spray Place 2 sprays into both nostrils daily. 16 g 2  . ibuprofen (ADVIL) 400 MG tablet Take 1 tablet (400 mg total) by mouth every 8 (eight) hours as needed. 90 tablet 5  . interferon beta-1a (AVONEX PREFILLED) 30 MCG/0.5ML PSKT injection INJECT ONE SYRINGE INTRAMUSCULARLY ONCE WEEKLY. REFRIGERATE. PROTECT FROM LIGHT. ALLOW TO WARM TO ROOM TEMPERATURE PRIOR TO USE. 3 each 3  . lidocaine-prilocaine (EMLA) cream Apply to affected area once 30 g 3  . meclizine (ANTIVERT) 25 MG tablet Take 1 tablet (25 mg total) by mouth 3 (three) times daily as needed for dizziness. 30 tablet 0  . metoprolol succinate (TOPROL-XL) 25 MG 24 hr tablet Take 25 mg by mouth daily.    . Multiple Vitamins-Minerals (CENTRUM SILVER ADULT 50+ PO) Take by mouth.    . ondansetron (ZOFRAN) 8 MG tablet Take 1 tablet (8 mg total) by mouth 2 (two) times daily as needed. Start on the third day after chemotherapy. 30 tablet 1  . potassium chloride SA (KLOR-CON) 20 MEQ tablet Take 1 tablet (20 mEq total) by mouth daily. 30 tablet 0  . prochlorperazine (COMPAZINE) 10 MG tablet Take 1 tablet (  10 mg total) by mouth every 6 (six) hours as needed (Nausea or vomiting). 30 tablet 1  . simvastatin (ZOCOR) 20 MG tablet Take 1 tablet by mouth daily.    [name] topiramate (TOPAMAX) 25 MG tablet Take 25 mg by mouth.    . VENTOLIN HFA 108 (90 BASE) MCG/ACT inhaler Inhale 2 puffs into the lungs every 6 (six) hours as needed.    [name] VITAMIN D PO Take 2 tablets by mouth daily. +calcium     No current  facility-administered medications for this visit.    PHYSICAL EXAMINATION: ECOG PERFORMANCE STATUS: 1 - Symptomatic but completely ambulatory  Vitals:   07/05/20 1143  BP: 117/68  Pulse: (!) 107  Resp: 18  Temp: 97.9 F (36.6 C)  SpO2: 96%   Filed Weights   07/05/20 1143  Weight: 213 lb 14.4 oz (97 kg)    LABORATORY DATA:  I have reviewed the data as listed CMP Latest Ref Rng & Units 06/28/2020 06/21/2020 06/14/2020  Glucose 70 - 99 mg/dL 114(H) 109(H) 93  BUN 6 - 20 mg/dL 4(L) 6 8  Creatinine 0.44 - 1.00 mg/dL 0.65 0.63 0.65  Sodium 135 - 145 mmol/L 141 142 142  Potassium 3.5 - 5.1 mmol/L 2.8(L) 2.9(L) 3.4(L)  Chloride 98 - 111 mmol/L 104 104 106  CO2 22 - 32 mmol/L 26 26 27   Calcium 8.9 - 10.3 mg/dL 8.8(L) 9.1 9.1  Total Protein 6.5 - 8.1 g/dL 6.5 6.6 6.8  Total Bilirubin 0.3 - 1.2 mg/dL 0.3 0.4 0.3  Alkaline Phos 38 - 126 U/L 85 82 102  AST 15 - 41 U/L 25 35 28  ALT 0 - 44 U/L 19 35 26    Lab Results  Component Value Date   WBC 3.4 (L) 07/05/2020   HGB 9.0 (L) 07/05/2020   HCT 26.5 (L) 07/05/2020   MCV 82.6 07/05/2020   PLT 166 07/05/2020   NEUTROABS PENDING 07/05/2020    ASSESSMENT & PLAN:  Malignant neoplasm of upper-outer quadrant of right breast in female, estrogen receptor negative ([place]) 01/10/2020:Palpable right breast mass. Mammogram showed a 1.8cm mass at the 10:30 position 2cm from the nipple, a 1.5cm mass at the 10:30 position 7cm from the nipple, and indeterminate calcifications between the two masses in the right breast. 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 BRCA2Mutation  Treatment plan: 1. Neoadjuvant chemotherapy with Adriamycin and Cytoxan every2weeks4 followed by Taxol weekly 12 with carboplatinand every 3 weeks x4  2. Followed by breast conserving surgery with sentinel lymph node study vsbilateral mastectomies 3. Followed by adjuvant radiation therapyif  she undergoes breast conserving surgery 4.Adjuvant olaparib URCC nausea study  Breast MRI12/04/2019: 2 cm spiculated mass UOQ right breast, 2.1 cm spiculated mass UOQ right breast, biopsy-proven malignancies. 7 mm suspicious enhancing mass: Biopsy 02/02/2020: High-grade DCIS ----------------------------------------------------------------------------------------------------------------------------------------------- Current treatment:Completed 4 cycles ofneoadjuvant Adriamycin and Cytoxan, today cycle10Taxol (with carboplatinevery 3 weeks) Echocardiogram 01/14/2020: EF 55 to 60%  Chemo Toxicities: 1.Fatigue 2.shortness of breath to minimal exertion: related to fatigue. 3.Tachycardia: Cardiac evaluation is being planned 5.Nausea: Much improved with addition of Zofran 6.Nailbed infection: After 1 week of Keflex she is looking better.  I will add 1 more week of Keflex antibiotics.  RTCweekly for Taxol Breast MRI  07/04/2020: Treatment response with no residual mass or enhancement  Return to clinic after surgery to discuss final pathology report.  Her port can be removed given her excellent radiological response.  No orders of the defined types were placed in this encounter.  The patient has a good understanding of the overall plan. she agrees with it. she will call with any problems that may develop before the next visit here.  Total time spent: 30 mins including face to face time and time spent for planning, charting and coordination of care  Rulon Eisenmenger, MD, MPH 07/05/2020  I, Molly Dorshimer, am acting as scribe for Dr. Nicholas Lose.  I have reviewed the above documentation for accuracy and completeness, and I agree with the above.

## 2020-07-05 ENCOUNTER — Inpatient Hospital Stay (HOSPITAL_BASED_OUTPATIENT_CLINIC_OR_DEPARTMENT_OTHER): Payer: 59 | Admitting: Hematology and Oncology

## 2020-07-05 ENCOUNTER — Inpatient Hospital Stay: Payer: 59

## 2020-07-05 ENCOUNTER — Encounter: Payer: Self-pay | Admitting: *Deleted

## 2020-07-05 VITALS — HR 104

## 2020-07-05 DIAGNOSIS — Z171 Estrogen receptor negative status [ER-]: Secondary | ICD-10-CM

## 2020-07-05 DIAGNOSIS — C50411 Malignant neoplasm of upper-outer quadrant of right female breast: Secondary | ICD-10-CM | POA: Diagnosis not present

## 2020-07-05 DIAGNOSIS — Z95828 Presence of other vascular implants and grafts: Secondary | ICD-10-CM

## 2020-07-05 LAB — CBC WITH DIFFERENTIAL (CANCER CENTER ONLY)
Abs Immature Granulocytes: 0.03 10*3/uL (ref 0.00–0.07)
Basophils Absolute: 0 10*3/uL (ref 0.0–0.1)
Basophils Relative: 0 %
Eosinophils Absolute: 0 10*3/uL (ref 0.0–0.5)
Eosinophils Relative: 1 %
HCT: 26.5 % — ABNORMAL LOW (ref 36.0–46.0)
Hemoglobin: 9 g/dL — ABNORMAL LOW (ref 12.0–15.0)
Immature Granulocytes: 1 %
Lymphocytes Relative: 34 %
Lymphs Abs: 1.2 10*3/uL (ref 0.7–4.0)
MCH: 28 pg (ref 26.0–34.0)
MCHC: 34 g/dL (ref 30.0–36.0)
MCV: 82.6 fL (ref 80.0–100.0)
Monocytes Absolute: 0.5 10*3/uL (ref 0.1–1.0)
Monocytes Relative: 16 %
Neutro Abs: 1.6 10*3/uL — ABNORMAL LOW (ref 1.7–7.7)
Neutrophils Relative %: 48 %
Platelet Count: 166 10*3/uL (ref 150–400)
RBC: 3.21 MIL/uL — ABNORMAL LOW (ref 3.87–5.11)
RDW: 18.6 % — ABNORMAL HIGH (ref 11.5–15.5)
WBC Count: 3.4 10*3/uL — ABNORMAL LOW (ref 4.0–10.5)
nRBC: 0 % (ref 0.0–0.2)

## 2020-07-05 LAB — CMP (CANCER CENTER ONLY)
ALT: 22 U/L (ref 0–44)
AST: 25 U/L (ref 15–41)
Albumin: 3.4 g/dL — ABNORMAL LOW (ref 3.5–5.0)
Alkaline Phosphatase: 92 U/L (ref 38–126)
Anion gap: 11 (ref 5–15)
BUN: 6 mg/dL (ref 6–20)
CO2: 26 mmol/L (ref 22–32)
Calcium: 9 mg/dL (ref 8.9–10.3)
Chloride: 105 mmol/L (ref 98–111)
Creatinine: 0.62 mg/dL (ref 0.44–1.00)
GFR, Estimated: >60 mL/min (ref >60)
Glucose, Bld: 98 mg/dL (ref 70–99)
Potassium: 3.1 mmol/L — ABNORMAL LOW (ref 3.5–5.1)
Sodium: 142 mmol/L (ref 135–145)
Total Bilirubin: 0.3 mg/dL (ref 0.3–1.2)
Total Protein: 6.5 g/dL (ref 6.5–8.1)

## 2020-07-05 MED ORDER — DIPHENHYDRAMINE HCL 50 MG/ML IJ SOLN
INTRAMUSCULAR | Status: AC
  Filled 2020-07-05: qty 1

## 2020-07-05 MED ORDER — SODIUM CHLORIDE 0.9% FLUSH
10.0000 mL | INTRAVENOUS | Status: DC | PRN
  Administered 2020-07-05: 10 mL
  Filled 2020-07-05: qty 10

## 2020-07-05 MED ORDER — ONDANSETRON HCL 4 MG/2ML IJ SOLN
INTRAMUSCULAR | Status: AC
  Filled 2020-07-05: qty 4

## 2020-07-05 MED ORDER — HEPARIN SOD (PORK) LOCK FLUSH 100 UNIT/ML IV SOLN
500.0000 [IU] | Freq: Once | INTRAVENOUS | Status: AC | PRN
  Administered 2020-07-05: 500 [IU]
  Filled 2020-07-05: qty 5

## 2020-07-05 MED ORDER — SODIUM CHLORIDE 0.9% FLUSH
10.0000 mL | Freq: Once | INTRAVENOUS | Status: DC
  Filled 2020-07-05: qty 10

## 2020-07-05 MED ORDER — ONDANSETRON HCL 4 MG/2ML IJ SOLN
8.0000 mg | Freq: Once | INTRAMUSCULAR | Status: AC
  Administered 2020-07-05: 8 mg via INTRAVENOUS

## 2020-07-05 MED ORDER — SODIUM CHLORIDE 0.9 % IV SOLN
65.0000 mg/m2 | Freq: Once | INTRAVENOUS | Status: AC
  Administered 2020-07-05: 138 mg via INTRAVENOUS
  Filled 2020-07-05: qty 23

## 2020-07-05 MED ORDER — FAMOTIDINE 20 MG IN NS 100 ML IVPB
INTRAVENOUS | Status: AC
  Filled 2020-07-05: qty 100

## 2020-07-05 MED ORDER — DIPHENHYDRAMINE HCL 50 MG/ML IJ SOLN
25.0000 mg | Freq: Once | INTRAMUSCULAR | Status: AC
  Administered 2020-07-05: 25 mg via INTRAVENOUS

## 2020-07-05 MED ORDER — SODIUM CHLORIDE 0.9 % IV SOLN
Freq: Once | INTRAVENOUS | Status: AC
  Filled 2020-07-05: qty 250

## 2020-07-05 MED ORDER — FAMOTIDINE 20 MG IN NS 100 ML IVPB
20.0000 mg | Freq: Once | INTRAVENOUS | Status: AC
  Administered 2020-07-05: 20 mg via INTRAVENOUS

## 2020-07-05 MED ORDER — SODIUM CHLORIDE 0.9 % IV SOLN
10.0000 mg | Freq: Once | INTRAVENOUS | Status: AC
  Administered 2020-07-05: 10 mg via INTRAVENOUS
  Filled 2020-07-05: qty 10

## 2020-07-05 NOTE — Assessment & Plan Note (Signed)
01/10/2020:Palpable right breast mass. Mammogram showed a 1.8cm mass at the 10:30 position 2cm from the nipple, a 1.5cm mass at the 10:30 position 7cm from the nipple, and indeterminate calcifications between the two masses in the right breast. 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 BRCA2Mutation  Treatment plan: 1. Neoadjuvant chemotherapy with Adriamycin and Cytoxan every2weeks4 followed by Taxol weekly 12 with carboplatinand every 3 weeks x4  2. Followed by breast conserving surgery with sentinel lymph node study vsbilateral mastectomies 3. Followed by adjuvant radiation therapyif she undergoes breast conserving surgery 4.Adjuvant olaparib URCC nausea study  Breast MRI12/04/2019: 2 cm spiculated mass UOQ right breast, 2.1 cm spiculated mass UOQ right breast, biopsy-proven malignancies. 7 mm suspicious enhancing mass: Biopsy 02/02/2020: High-grade DCIS ----------------------------------------------------------------------------------------------------------------------------------------------- Current treatment:Completed 4 cycles ofneoadjuvant Adriamycin and Cytoxan, today cycle10Taxol (with carboplatinevery 3 weeks) Echocardiogram 01/14/2020: EF 55 to 60%  Chemo Toxicities: 1.Fatigue 2.shortness of breath to minimal exertion: related to fatigue. 3.Tachycardia: Cardiac evaluation is being planned 5.Nausea: Much improved with addition of Zofran 6.Nailbed infection: After 1 week of Keflex she is looking better.  I will add 1 more week of Keflex antibiotics.  RTCweekly for Taxol Breast MRI  07/04/2020: Treatment response with no residual mass or enhancement  Return to clinic after surgery to discuss final pathology report.

## 2020-07-05 NOTE — Progress Notes (Signed)
Per Dr. [name] - okay to proceed with treatment with elevated heart rate.

## 2020-07-05 NOTE — Patient Instructions (Signed)
[facility]  Happy Last Treatment Day!  Discharge Instructions: Thank you for choosing [facility] to provide your oncology and hematology care.   If you have a lab appointment with the [facility], please go directly to the [facility] and check in at the registration area.   Wear comfortable clothing and clothing appropriate for easy access to any Portacath or PICC line.   We strive to give you quality time with your provider. You may need to reschedule your appointment if you arrive late (15 or more minutes).  Arriving late affects you and other patients whose appointments are after yours.  Also, if you miss three or more appointments without notifying the office, you may be dismissed from the clinic at the provider's discretion.      For prescription refill requests, have your pharmacy contact our office and allow 72 hours for refills to be completed.    Today you received the following chemotherapy and/or immunotherapy agents: Paclitaxel (Taxol)   To help prevent nausea and vomiting after your treatment, we encourage you to take your nausea medication as directed.  BELOW ARE SYMPTOMS THAT SHOULD BE REPORTED IMMEDIATELY: . *FEVER GREATER THAN 100.4 F (38 C) OR HIGHER . *CHILLS OR SWEATING . *NAUSEA AND VOMITING THAT IS NOT CONTROLLED WITH YOUR NAUSEA MEDICATION . *UNUSUAL SHORTNESS OF BREATH . *UNUSUAL BRUISING OR BLEEDING . *URINARY PROBLEMS (pain or burning when urinating, or frequent urination) . *BOWEL PROBLEMS (unusual diarrhea, constipation, pain near the anus) . TENDERNESS IN MOUTH AND THROAT WITH OR WITHOUT PRESENCE OF ULCERS (sore throat, sores in mouth, or a toothache) . UNUSUAL RASH, SWELLING OR PAIN  . UNUSUAL VAGINAL DISCHARGE OR ITCHING   Items with * indicate a potential emergency and should be followed up as soon as possible or go to the Emergency Department if any problems should occur.  Please show the CHEMOTHERAPY  ALERT CARD or IMMUNOTHERAPY ALERT CARD at check-in to the Emergency Department and triage nurse.  Should you have questions after your visit or need to cancel or reschedule your appointment, please contact [facility]  Dept: [phone]  and follow the prompts.  Office hours are 8:00 a.m. to 4:30 p.m. Monday - Friday. Please note that voicemails left after 4:00 p.m. may not be returned until the following business day.  We are closed weekends and major holidays. You have access to a nurse at all times for urgent questions. Please call the main number to the clinic Dept: [phone] and follow the prompts.   For any non-urgent questions, you may also contact your provider using MyChart. We now offer e-Visits for anyone 46 and older to request care online for non-urgent symptoms. For details visit [email or web address].   Also download the MyChart app! Go to the app store, search "MyChart", open the app, select [facility], and log in with your MyChart username and password.  Due to Covid, a mask is required upon entering the hospital/clinic. If you do not have a mask, one will be given to you upon arrival. For doctor visits, patients may have 1 support person aged 10 or older with them. For treatment visits, patients cannot have anyone with them due to current Covid guidelines and our immunocompromised population.

## 2020-07-06 ENCOUNTER — Telehealth: Payer: Self-pay | Admitting: Hematology and Oncology

## 2020-07-06 NOTE — Telephone Encounter (Signed)
Scheduled appointment per 05/25 los. Patient is aware.

## 2020-07-12 ENCOUNTER — Telehealth: Payer: Self-pay | Admitting: *Deleted

## 2020-07-12 NOTE — Telephone Encounter (Signed)
Received VM from pt.  Attempt x1 to return call, no answer. LVM to return call to the office.

## 2020-07-20 ENCOUNTER — Other Ambulatory Visit: Payer: Self-pay | Admitting: General Surgery

## 2020-07-20 ENCOUNTER — Other Ambulatory Visit: Payer: Self-pay | Admitting: Oncology

## 2020-07-27 ENCOUNTER — Ambulatory Visit: Payer: 59 | Admitting: Neurology

## 2020-08-01 NOTE — Progress Notes (Signed)
Surgical Instructions    Your procedure is scheduled on Tuesday June 28th.  Report to [facility] Main Entrance "A" at 9:30 A.M., then check in with the Admitting office.  Call this number if you have problems the morning of surgery:  [phone]   If you have any questions prior to your surgery date call [phone]: Open Monday-Friday 8am-4pm    Remember:  Do not eat after midnight the night before your surgery  You may drink clear liquids until 8:30am the morning of your surgery.   Clear liquids allowed are: Water, Non-Citrus Juices (without pulp), Carbonated Beverages, Clear Tea, Black Coffee Only, and Gatorade    Take these medicines the morning of surgery with A SIP OF WATER  Ferrous Sulfate Dried (SLOW RELEASE IRON) 45 MG TBCR potassium chloride SA (KLOR-CON) 20 MEQ tablet  IF NEEDED fexofenadine (ALLEGRA) 180 MG tablet  fluticasone (FLONASE) 50 MCG/ACT nasal spray VENTOLIN HFA 108 (90 BASE) MCG/ACT inhaler, please bring with you to the hospital  As of today, STOP taking any Aspirin (unless otherwise instructed by your surgeon) Aleve, Naproxen, Ibuprofen, Motrin, Advil, Goody's, BC's, all herbal medications, fish oil, and all vitamins.          Do not wear jewelry or makeup Do not wear lotions, powders, perfumes, or deodorant. Do not shave 48 hours prior to surgery.   Do not bring valuables to the hospital. DO Not wear nail polish, gel polish, artificial nails, or any other type of covering on  natural nails including finger and toenails. If patients have artificial nails, gel coating, etc. that need to be removed by a nail salon please have this removed prior to surgery or surgery may need to be canceled/delayed if the surgeon/ anesthesia feels like the patient is unable to be adequately monitored.             [place] is not responsible for any belongings or valuables.  Do NOT Smoke (Tobacco/Vaping) or drink Alcohol 24 hours prior to your procedure If you use a CPAP  at night, you may bring all equipment for your overnight stay.   Contacts, glasses, dentures or bridgework may not be worn into surgery, please bring cases for these belongings   For patients admitted to the hospital, discharge time will be determined by your treatment team.   Patients discharged the day of surgery will not be allowed to drive home, and someone needs to stay with them for 24 hours.  ONLY 1 SUPPORT PERSON MAY BE PRESENT WHILE YOU ARE IN SURGERY. IF YOU ARE TO BE ADMITTED ONCE YOU ARE IN YOUR ROOM YOU WILL BE ALLOWED TWO (2) VISITORS.  Minor children may have two parents present. Special consideration for safety and communication needs will be reviewed on a case by case basis.  Special instructions:    Oral Hygiene is also important to reduce your risk of infection.  Remember - BRUSH YOUR TEETH THE MORNING OF SURGERY WITH YOUR REGULAR TOOTHPASTE   [facility]- Preparing For Surgery  Before surgery, you can play an important role. Because skin is not sterile, your skin needs to be as free of germs as possible. You can reduce the number of germs on your skin by washing with CHG (chlorahexidine gluconate) Soap before surgery.  CHG is an antiseptic cleaner which kills germs and bonds with the skin to continue killing germs even after washing.     Please do not use if you have an allergy to CHG or antibacterial soaps. If  your skin becomes reddened/irritated stop using the CHG.  Do not shave (including legs and underarms) for at least 48 hours prior to first CHG shower. It is OK to shave your face.  Please follow these instructions carefully.     Shower the NIGHT BEFORE SURGERY and the MORNING OF SURGERY with CHG Soap.   If you chose to wash your hair, wash your hair first as usual with your normal shampoo. After you shampoo, rinse your hair and body thoroughly to remove the shampoo.  Then [employer] and genitals (private parts) with your normal soap and rinse thoroughly to remove  soap.  After that Use CHG Soap as you would any other liquid soap. You can apply CHG directly to the skin and wash gently with a scrungie or a clean washcloth.   Apply the CHG Soap to your body ONLY FROM THE NECK DOWN.  Do not use on open wounds or open sores. Avoid contact with your eyes, ears, mouth and genitals (private parts). Wash Face and genitals (private parts)  with your normal soap.   Wash thoroughly, paying special attention to the area where your surgery will be performed.  Thoroughly rinse your body with warm water from the neck down.  DO NOT shower/wash with your normal soap after using and rinsing off the CHG Soap.  Pat yourself dry with a CLEAN TOWEL.  Wear CLEAN PAJAMAS to bed the night before surgery  Place CLEAN SHEETS on your bed the night before your surgery  DO NOT SLEEP WITH PETS.   Day of Surgery:  Take a shower with CHG soap. Wear Clean/Comfortable clothing the morning of surgery Do not apply any deodorants/lotions.   Remember to brush your teeth WITH YOUR REGULAR TOOTHPASTE.   Please read over the following fact sheets that you were given.

## 2020-08-02 ENCOUNTER — Other Ambulatory Visit: Payer: Self-pay

## 2020-08-02 ENCOUNTER — Encounter (HOSPITAL_COMMUNITY): Payer: Self-pay

## 2020-08-02 ENCOUNTER — Encounter (HOSPITAL_COMMUNITY)
Admission: RE | Admit: 2020-08-02 | Discharge: 2020-08-02 | Disposition: A | Discharge status: Home / Self Care | Payer: 59 | Source: Ambulatory Visit | Attending: General Surgery | Admitting: General Surgery

## 2020-08-02 DIAGNOSIS — Z01812 Encounter for preprocedural laboratory examination: Secondary | ICD-10-CM | POA: Diagnosis not present

## 2020-08-02 HISTORY — DX: Pneumonia, unspecified organism: J18.9

## 2020-08-02 HISTORY — DX: Myoneural disorder, unspecified: G70.9

## 2020-08-02 LAB — BASIC METABOLIC PANEL
Anion gap: 8 (ref 5–15)
BUN: 7 mg/dL (ref 6–20)
CO2: 24 mmol/L (ref 22–32)
Calcium: 8.9 mg/dL (ref 8.9–10.3)
Chloride: 107 mmol/L (ref 98–111)
Creatinine, Ser: 0.59 mg/dL (ref 0.44–1.00)
GFR, Estimated: >60 mL/min (ref >60)
Glucose, Bld: 114 mg/dL — ABNORMAL HIGH (ref 70–99)
Potassium: 3.6 mmol/L (ref 3.5–5.1)
Sodium: 139 mmol/L (ref 135–145)

## 2020-08-02 LAB — CBC
HCT: 34.5 % — ABNORMAL LOW (ref 36.0–46.0)
Hemoglobin: 10.8 g/dL — ABNORMAL LOW (ref 12.0–15.0)
MCH: 27.4 pg (ref 26.0–34.0)
MCHC: 31.3 g/dL (ref 30.0–36.0)
MCV: 87.6 fL (ref 80.0–100.0)
Platelets: 274 10*3/uL (ref 150–400)
RBC: 3.94 MIL/uL (ref 3.87–5.11)
RDW: 17.7 % — ABNORMAL HIGH (ref 11.5–15.5)
WBC: 6.9 10*3/uL (ref 4.0–10.5)
nRBC: 0 % (ref 0.0–0.2)

## 2020-08-02 NOTE — Progress Notes (Addendum)
PCP - Rolan Lipa Cardiologist - [place] Neurology: Dr. Felecia Shelling  PPM/ICD - denies   Chest x-ray - n/a EKG - 05/05/20 Stress Test - 06/01/20 ECHO - 06/01/20 Cardiac Cath - denies  Sleep Study - denies   No diabetes  Patient instructed to hold all Aspirin, NSAID's, herbal medications, fish oil and vitamins 7 days prior to surgery.   ERAS Protcol -yes PRE-SURGERY Ensure or G2- ensure not given  COVID TEST- ambulatory surgery (not needed)   Anesthesia review: no  Patient denies shortness of breath, fever, cough and chest pain at PAT appointment   All instructions explained to the patient, with a verbal understanding of the material. Patient agrees to go over the instructions while at home for a better understanding. Patient also instructed to self quarantine after being tested for COVID-19. The opportunity to ask questions was provided.

## 2020-08-04 ENCOUNTER — Ambulatory Visit (INDEPENDENT_AMBULATORY_CARE_PROVIDER_SITE_OTHER): Payer: 59 | Admitting: Neurology

## 2020-08-04 ENCOUNTER — Encounter: Payer: Self-pay | Admitting: Neurology

## 2020-08-04 VITALS — BP 122/74 | HR 89 | Ht 64.0 in | Wt 212.0 lb

## 2020-08-04 DIAGNOSIS — G43009 Migraine without aura, not intractable, without status migrainosus: Secondary | ICD-10-CM | POA: Diagnosis not present

## 2020-08-04 DIAGNOSIS — E559 Vitamin D deficiency, unspecified: Secondary | ICD-10-CM

## 2020-08-04 DIAGNOSIS — R200 Anesthesia of skin: Secondary | ICD-10-CM

## 2020-08-04 DIAGNOSIS — G35 Multiple sclerosis: Secondary | ICD-10-CM

## 2020-08-04 DIAGNOSIS — Z79899 Other long term (current) drug therapy: Secondary | ICD-10-CM | POA: Diagnosis not present

## 2020-08-04 DIAGNOSIS — R208 Other disturbances of skin sensation: Secondary | ICD-10-CM

## 2020-08-04 NOTE — Progress Notes (Signed)
[facility]  PATIENT: Jacqueline Adams DOB: [birth year]  REFERRING DOCTOR OR PCP:  Rolan Lipa  _________________________________   HISTORICAL  CHIEF COMPLAINT:  Chief Complaint  Patient presents with   Follow-up    Room 13 - alone. Last seen 09/22/19. MS follow up. She has continued using Avonex. No new changes with her MS. Feels stable on this treatment. She completed chemotherapy on 07/05/20 for right breast cancer.    HISTORY OF PRESENT ILLNESS:   Jacqueline Adams is a 60 y.o. woman with relapsing remitting MS diagnosed in 2009.    Update 08/04/2020: She is on Avonex and tolerates it well.    She is getting the drug through an assistance.  She tolerates it well.  No injection reactios   Gait is doing well.   She is now working back in the office [facility]) and needs to move more.   She can go downstairs without the banister.  No weakness or spasticity in her legs though she has shooting pain in her right leg.    Not bad enough to treat.   Bladder is doing well.   Vision is fine.    She denies much fatigue.   Her sleep pattern is better since she stopped chemo.    She is walking some everyday.   She denies depression.  Cognition is doing well.    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    She was carboplatin and paclitaxel.   She gets shooting pain in her right hand/arm at times only.    She will be getting radioactive seeds and lymph node biopsy next week.   MS History:  In 2009, she had the onset of right arm and leg tingling and clumsiness. At the time, she was seeing Dr. Earley Favor for migraines and he performed a nerve conduction study only showing mild carpal tunnel syndrome. Symptoms worsened the next day and she was referred to Dr. Erling Cruz. An MRI of the brain, cervical spine and a lumbar puncture was performed. The brain MRI showed a large left frontoparietal periventricular focus and several other foci. CSF was  consistent with multiple sclerosis.  MRI of the cervical spine was normal. She was started on Avonex.   She was switched to Tecfidera in late 2014 as she had one new focus on her 2014 MRI. She switched back to Avonex in 2016..  Imaging MRI of the brain 04/29/2018 showed T2/FLAIR hyperintense foci in the hemispheres in a pattern consistent with demyelination/MS.  None of the foci were acute and there were no new lesions compared to the MRI from 2018.  MRI of the brain 05/22/2016 showed T2/FLAIR foci in the hemispheres consistent with MS.  There were no acute findings.  No new lesions compared to 05/14/2015  MRI of the cervical spine 05/22/2016 showed a normal spinal cord and mild disc degenerative changes but no nerve root compression or spinal stenosis.  REVIEW OF SYSTEMS: Constitutional: No fevers, chills, sweats, or change in appetite.   She has fatigue Eyes: No visual changes, double vision, eye pain.   Being followed for borderline high intraocular pressure. Ear, nose and throat: No hearing loss, ear pain/.    Respiratory:  No shortness of breath at rest or with exertion.   No wheezes.   She has frequent bronchitis. Cardiovascular: No chest pain, palpitations GastrointestinaI: No nausea, vomiting, diarrhea, abdominal pain, fecal incontinence Genitourinary:  No dysuria.  She has urgency and or frequency.  2-3 x  nocturia. Musculoskeletal:  No neck pain, back pain Integumentary: No rash, pruritus, skin lesions Neurological: as above Psychiatric: No depression at this time.  No anxiety Endocrine: No palpitations, diaphoresis, change in appetite, change in weigh or increased thirst Hematologic/Lymphatic:  No anemia, purpura, petechiae. Allergic/Immunologic: No itchy/runny eyes, nasal congestion, recent allergic reactions, rashes  ALLERGIES: Allergies  Allergen Reactions   Procaine Swelling   Relafen [Nabumetone] Swelling    HOME MEDICATIONS:  Current Outpatient Medications:    Calcium  Carb-Cholecalciferol (CALCIUM 600+D3 PO), Take 1 tablet by mouth in the morning., Disp: , Rfl:    cholecalciferol (VITAMIN D) 25 MCG (1000 UNIT) tablet, Take 2,000 Units by mouth in the morning., Disp: , Rfl:    Ferrous Sulfate Dried (SLOW RELEASE IRON) 45 MG TBCR, Take 45 mg by mouth in the morning., Disp: , Rfl:    fexofenadine (ALLEGRA) 180 MG tablet, Take 180 mg by mouth daily as needed for allergies., Disp: , Rfl:    fluticasone (FLONASE) 50 MCG/ACT nasal spray, Place 2 sprays into both nostrils daily. (Patient taking differently: Place 2 sprays into both nostrils daily as needed for allergies.), Disp: 16 g, Rfl: 2   ibuprofen (ADVIL) 200 MG tablet, Take 400 mg by mouth every Thursday. W/Avonex injection, Disp: , Rfl:    interferon beta-1a (AVONEX PREFILLED) 30 MCG/0.5ML PSKT injection, INJECT ONE SYRINGE INTRAMUSCULARLY ONCE WEEKLY. REFRIGERATE. PROTECT FROM LIGHT. ALLOW TO WARM TO ROOM TEMPERATURE PRIOR TO USE. (Patient taking differently: Inject 30 mcg into the muscle every Thursday. INJECT ONE SYRINGE INTRAMUSCULARLY ONCE WEEKLY. REFRIGERATE. PROTECT FROM LIGHT. ALLOW TO WARM TO ROOM TEMPERATURE PRIOR TO USE.), Disp: 3 each, Rfl: 3   meclizine (ANTIVERT) 25 MG tablet, Take 1 tablet (25 mg total) by mouth 3 (three) times daily as needed for dizziness., Disp: 30 tablet, Rfl: 0   Multiple Vitamin (MULTIVITAMIN WITH MINERALS) TABS tablet, Take 1 tablet by mouth every evening. Centrum, Disp: , Rfl:    prochlorperazine (COMPAZINE) 10 MG tablet, Take 1 tablet (10 mg total) by mouth every 6 (six) hours as needed (Nausea or vomiting)., Disp: 30 tablet, Rfl: 1   simvastatin (ZOCOR) 20 MG tablet, Take 20 mg by mouth every evening., Disp: , Rfl:    VENTOLIN HFA 108 (90 BASE) MCG/ACT inhaler, Inhale 1-2 puffs into the lungs every 6 (six) hours as needed for shortness of breath or wheezing., Disp: , Rfl:   PAST MEDICAL HISTORY: Past Medical History:  Diagnosis Date   Breast cancer ([place]) 01/2020   right  breast IDC   Bronchitis    Cervical spondylarthritis    Headache(784.0)    High cholesterol    Leukocytosis 2009   Multiple sclerosis ([place]) 2009   followed by Dr. Erling Cruz   Neuromuscular disorder [facility])    Pneumonia    Rhinitis    Seasonal allergies    Sinusitis     PAST SURGICAL HISTORY: Past Surgical History:  Procedure Laterality Date   ABDOMINAL HYSTERECTOMY     CESAREAN SECTION     x 3   CORRECTION HAMMER TOE Bilateral    PARTIAL HYSTERECTOMY  2006   for uterine fibroid.    PORTACATH PLACEMENT Right 02/22/2020   Procedure: RIGHT SUBCLAVIAN VEIN PORT PLACEMENT;  Surgeon: Donnie Mesa, MD;  Location: [facility];  Service: General;  Laterality: Right;    FAMILY HISTORY: Family History  Problem Relation Age of Onset   Cancer Mother 73       ovarian cancer; now with  suspected breast cancer   Diabetes Mother        also HTN   Breast cancer Mother    Clotting disorder Father    Prostate cancer Father    Breast cancer Maternal Aunt    Cancer Maternal Grandmother        unknown female reproductive organ cancer   Breast cancer Maternal Aunt    Breast cancer Maternal Aunt    Colon cancer Sister 67       anal cancer (2014)   Lung cancer Sister 30   Stroke Sister 77   Ovarian cancer Sister 35   BRCA 1/2 Sister     SOCIAL HISTORY:  Social History   Socioeconomic History   Marital status: Married    Spouse name: John   Number of children: 2   Years of education: Engineer, agricultural education level: Not on file  Occupational History   Occupation: Print production planner: [employer]  Tobacco Use   Smoking status: Never   Smokeless tobacco: Never  Vaping Use   Vaping Use: Never used  Substance and Sexual Activity   Alcohol use: No   Drug use: No   Sexual activity: Yes    Birth control/protection: Surgical  Other Topics Concern   Not on file  Social History Narrative   Patient lives at home with her spouse.   Caffeine Use: 2 sodas daily    Social Determinants of Health   Financial Resource Strain: Low Risk    Difficulty of Paying Living Expenses: Not hard at all  Food Insecurity: No Food Insecurity   Worried About Charity fundraiser in the Last Year: Never true   Ran Out of Food in the Last Year: Never true  Transportation Needs: No Transportation Needs   Lack of Transportation (Medical): No   Lack of Transportation (Non-Medical): No  Physical Activity: Not on file  Stress: Not on file  Social Connections: Socially Integrated   Frequency of Communication with Friends and Family: More than three times a week   Frequency of Social Gatherings with Friends and Family: Not on file   Attends Religious Services: More than 4 times per year   Active Member of [employer] or Organizations: Not on file   Attends Archivist Meetings: More than 4 times per year   Marital Status: Married  Human resources officer Violence: Not on file     PHYSICAL EXAM  Vitals:   08/04/20 0933  BP: 122/74  Pulse: 89  Weight: 212 lb (96.2 kg)  Height: _0  (1.626 m)    Body mass index is 36.39 kg/m.   General: The patient is well-developed and well-nourished and in no acute distress   Neurologic Exam  Mental status: The patient is alert and oriented x 3 at the time of the examination. The patient has apparent normal recent and remote memory, with an apparently normal attention span and concentration ability.   Speech is normal.  Cranial nerves: Extraocular movements are full.  Color vision is symmetric.   Facial strength and sensation was normal.  Trapezius strength is normal.  No obvious hearing deficits are noted.  Motor:  Muscle bulk is normal.   Tone is normal. Strength is  5 / 5 in all 4 extremities.   Sensory: Normal sensation to touch and vibration in bilateral arms and legs.   Coordination: Cerebellar testing reveals good finger-nose-finger and heel-to-shin bilaterally.  Gait and station: Station is normal.  The gait was  normal.  Tandem gait was minimally wide.  Romberg was negative.  Reflexes: Deep tendon reflexes are normal and symmetric in the arms.  Deep tendon reflexes are mildly increased at the knees.       DIAGNOSTIC DATA (LABS, IMAGING, TESTING) - I reviewed patient records, labs, notes, testing and imaging myself where available.  Lab Results  Component Value Date   WBC 6.9 08/02/2020   HGB 10.8 (L) 08/02/2020   HCT 34.5 (L) 08/02/2020   MCV 87.6 08/02/2020   PLT 274 08/02/2020      Component Value Date/Time   NA 139 08/02/2020 1021   NA 142 05/05/2020 1625   NA 143 11/22/2013 1514   K 3.6 08/02/2020 1021   K 3.1 (L) 11/22/2013 1514   CL 107 08/02/2020 1021   CL 105 02/18/2012 1207   CO2 24 08/02/2020 1021   CO2 25 11/22/2013 1514   GLUCOSE 114 (H) 08/02/2020 1021   GLUCOSE 105 11/22/2013 1514   GLUCOSE 96 02/18/2012 1207   BUN 7 08/02/2020 1021   BUN 8 05/05/2020 1625   BUN 6.6 (L) 11/22/2013 1514   CREATININE 0.59 08/02/2020 1021   CREATININE 0.62 07/05/2020 1135   CREATININE 0.8 11/22/2013 1514   CALCIUM 8.9 08/02/2020 1021   CALCIUM 9.0 11/22/2013 1514   PROT 6.5 07/05/2020 1135   PROT 7.0 09/22/2019 0840   PROT 7.4 11/22/2013 1514   ALBUMIN 3.4 (L) 07/05/2020 1135   ALBUMIN 4.0 09/22/2019 0840   ALBUMIN 3.3 (L) 11/22/2013 1514   AST 25 07/05/2020 1135   AST 15 11/22/2013 1514   ALT 22 07/05/2020 1135   ALT 13 11/22/2013 1514   ALKPHOS 92 07/05/2020 1135   ALKPHOS 91 11/22/2013 1514   BILITOT 0.3 07/05/2020 1135   BILITOT 0.23 11/22/2013 1514   GFRNONAA >60 08/02/2020 1021   GFRNONAA >60 07/05/2020 1135   GFRAA 113 09/22/2019 0840       ASSESSMENT AND PLAN    1. Multiple sclerosis ([place])   2. Numbness   3. Migraine without aura and without status migrainosus, not intractable   4. High risk medication use   5. Vitamin D deficiency   6. Dysesthesia        1. Continue Avonex.   She has recent blood work and does not need labs today.[name]    MRI of the  brain and cervical spine to determine if there has been any subclinical progression.  Consider a switch to a more efficacious medicine if this is occurring.   2.  Continue vit D supplementation, if low today increase amount.   3.  Try to stay active and exercises as tolerated. 4.  She is continuing to follow-up with oncology for her breast cancer.   5.   She will return to see [place] in 6 months or sooner if there are new or worsening neurologic symptoms.   Sapphira Harjo A. Felecia Shelling, MD, PhD 8/65/7846, 96:29 PM Certified in Neurology, Clinical Neurophysiology, Sleep Medicine, Pain Medicine and Neuroimaging  [facility] [address] [place], [place] [address] [phone]

## 2020-08-07 ENCOUNTER — Other Ambulatory Visit: Payer: Self-pay

## 2020-08-07 ENCOUNTER — Ambulatory Visit
Admission: RE | Admit: 2020-08-07 | Discharge: 2020-08-07 | Disposition: A | Discharge status: Home / Self Care | Payer: 59 | Source: Ambulatory Visit | Attending: General Surgery | Admitting: General Surgery

## 2020-08-07 ENCOUNTER — Other Ambulatory Visit: Payer: Self-pay | Admitting: General Surgery

## 2020-08-07 DIAGNOSIS — Z170 Estrogen receptor positive status [ER+]: Secondary | ICD-10-CM

## 2020-08-08 ENCOUNTER — Ambulatory Visit (HOSPITAL_COMMUNITY)
Admission: RE | Admit: 2020-08-08 | Discharge: 2020-08-08 | Disposition: A | Discharge status: Home / Self Care | Payer: 59 | Source: Ambulatory Visit | Attending: General Surgery | Admitting: General Surgery

## 2020-08-08 ENCOUNTER — Encounter (HOSPITAL_COMMUNITY): Payer: Self-pay | Admitting: General Surgery

## 2020-08-08 ENCOUNTER — Ambulatory Visit
Admission: RE | Admit: 2020-08-08 | Discharge: 2020-08-08 | Disposition: A | Discharge status: Home / Self Care | Payer: 59 | Source: Ambulatory Visit | Attending: General Surgery | Admitting: General Surgery

## 2020-08-08 ENCOUNTER — Encounter (HOSPITAL_COMMUNITY)
Admission: RE | Disposition: A | Discharge status: Home / Self Care | Payer: Self-pay | Source: Ambulatory Visit | Attending: General Surgery

## 2020-08-08 ENCOUNTER — Ambulatory Visit (HOSPITAL_COMMUNITY): Payer: 59 | Admitting: Physician Assistant

## 2020-08-08 ENCOUNTER — Other Ambulatory Visit: Payer: Self-pay | Admitting: General Surgery

## 2020-08-08 ENCOUNTER — Ambulatory Visit (HOSPITAL_COMMUNITY): Payer: 59 | Admitting: Anesthesiology

## 2020-08-08 ENCOUNTER — Telehealth: Payer: Self-pay | Admitting: Neurology

## 2020-08-08 ENCOUNTER — Other Ambulatory Visit: Payer: Self-pay

## 2020-08-08 DIAGNOSIS — C50411 Malignant neoplasm of upper-outer quadrant of right female breast: Secondary | ICD-10-CM | POA: Insufficient documentation

## 2020-08-08 DIAGNOSIS — Z853 Personal history of malignant neoplasm of breast: Secondary | ICD-10-CM | POA: Diagnosis not present

## 2020-08-08 DIAGNOSIS — Z886 Allergy status to analgesic agent status: Secondary | ICD-10-CM | POA: Insufficient documentation

## 2020-08-08 DIAGNOSIS — Z7951 Long term (current) use of inhaled steroids: Secondary | ICD-10-CM | POA: Diagnosis not present

## 2020-08-08 DIAGNOSIS — Z5111 Encounter for antineoplastic chemotherapy: Secondary | ICD-10-CM | POA: Insufficient documentation

## 2020-08-08 DIAGNOSIS — Z1502 Genetic susceptibility to malignant neoplasm of ovary: Secondary | ICD-10-CM | POA: Insufficient documentation

## 2020-08-08 DIAGNOSIS — Z1509 Genetic susceptibility to other malignant neoplasm: Secondary | ICD-10-CM | POA: Insufficient documentation

## 2020-08-08 DIAGNOSIS — Z800 Family history of malignant neoplasm of digestive organs: Secondary | ICD-10-CM | POA: Diagnosis not present

## 2020-08-08 DIAGNOSIS — Z1501 Genetic susceptibility to malignant neoplasm of breast: Secondary | ICD-10-CM | POA: Insufficient documentation

## 2020-08-08 DIAGNOSIS — Z884 Allergy status to anesthetic agent status: Secondary | ICD-10-CM | POA: Diagnosis not present

## 2020-08-08 DIAGNOSIS — Z170 Estrogen receptor positive status [ER+]: Secondary | ICD-10-CM

## 2020-08-08 DIAGNOSIS — Z9221 Personal history of antineoplastic chemotherapy: Secondary | ICD-10-CM | POA: Insufficient documentation

## 2020-08-08 DIAGNOSIS — Z171 Estrogen receptor negative status [ER-]: Secondary | ICD-10-CM | POA: Diagnosis not present

## 2020-08-08 DIAGNOSIS — Z801 Family history of malignant neoplasm of trachea, bronchus and lung: Secondary | ICD-10-CM | POA: Diagnosis not present

## 2020-08-08 DIAGNOSIS — Z803 Family history of malignant neoplasm of breast: Secondary | ICD-10-CM | POA: Diagnosis not present

## 2020-08-08 DIAGNOSIS — Z79899 Other long term (current) drug therapy: Secondary | ICD-10-CM | POA: Insufficient documentation

## 2020-08-08 DIAGNOSIS — Z8041 Family history of malignant neoplasm of ovary: Secondary | ICD-10-CM | POA: Insufficient documentation

## 2020-08-08 DIAGNOSIS — G35 Multiple sclerosis: Secondary | ICD-10-CM | POA: Diagnosis not present

## 2020-08-08 HISTORY — PX: BREAST LUMPECTOMY: SHX2

## 2020-08-08 HISTORY — PX: BREAST LUMPECTOMY WITH RADIOACTIVE SEED AND SENTINEL LYMPH NODE BIOPSY: SHX6550

## 2020-08-08 SURGERY — BREAST LUMPECTOMY WITH RADIOACTIVE SEED AND SENTINEL LYMPH NODE BIOPSY
Anesthesia: General | Site: Breast | Laterality: Right

## 2020-08-08 MED ORDER — SODIUM CHLORIDE (PF) 0.9 % IJ SOLN
INTRAMUSCULAR | Status: AC
  Filled 2020-08-08: qty 10

## 2020-08-08 MED ORDER — HYDROMORPHONE HCL 1 MG/ML IJ SOLN: 0.2500 mg | INTRAMUSCULAR | Status: DC | PRN

## 2020-08-08 MED ORDER — LIDOCAINE 2% (20 MG/ML) 5 ML SYRINGE
INTRAMUSCULAR | Status: AC
  Filled 2020-08-08: qty 5

## 2020-08-08 MED ORDER — PHENYLEPHRINE 40 MCG/ML (10ML) SYRINGE FOR IV PUSH (FOR BLOOD PRESSURE SUPPORT)
PREFILLED_SYRINGE | INTRAVENOUS | Status: AC
  Filled 2020-08-08: qty 10

## 2020-08-08 MED ORDER — ACETAMINOPHEN 500 MG PO TABS
1000.0000 mg | ORAL_TABLET | ORAL | Status: AC
  Administered 2020-08-08: 1000 mg via ORAL
  Filled 2020-08-08: qty 2

## 2020-08-08 MED ORDER — LACTATED RINGERS IV SOLN: INTRAVENOUS | Status: DC

## 2020-08-08 MED ORDER — PROPOFOL 10 MG/ML IV BOLUS
INTRAVENOUS | Status: AC
  Filled 2020-08-08: qty 20

## 2020-08-08 MED ORDER — OXYCODONE HCL 5 MG PO TABS: 5.0000 mg | ORAL_TABLET | Freq: Four times a day (QID) | ORAL | 0 refills | Status: DC | PRN

## 2020-08-08 MED ORDER — SODIUM CHLORIDE (PF) 0.9 % IJ SOLN
INTRAVENOUS | Status: DC | PRN
  Administered 2020-08-08: 5 mL via INTRAMUSCULAR

## 2020-08-08 MED ORDER — CHLORHEXIDINE GLUCONATE 0.12 % MT SOLN
15.0000 mL | Freq: Once | OROMUCOSAL | Status: AC
  Administered 2020-08-08: 15 mL via OROMUCOSAL
  Filled 2020-08-08: qty 15

## 2020-08-08 MED ORDER — ORAL CARE MOUTH RINSE: 15.0000 mL | Freq: Once | OROMUCOSAL | Status: AC

## 2020-08-08 MED ORDER — ONDANSETRON HCL 4 MG/2ML IJ SOLN
INTRAMUSCULAR | Status: AC
  Filled 2020-08-08: qty 2

## 2020-08-08 MED ORDER — DEXAMETHASONE SODIUM PHOSPHATE 10 MG/ML IJ SOLN
INTRAMUSCULAR | Status: AC
  Filled 2020-08-08: qty 1

## 2020-08-08 MED ORDER — MIDAZOLAM HCL 2 MG/2ML IJ SOLN
INTRAMUSCULAR | Status: AC
  Filled 2020-08-08: qty 2

## 2020-08-08 MED ORDER — OXYCODONE HCL 5 MG/5ML PO SOLN: 5.0000 mg | Freq: Once | ORAL | Status: DC | PRN

## 2020-08-08 MED ORDER — MEPERIDINE HCL 25 MG/ML IJ SOLN
6.2500 mg | INTRAMUSCULAR | Status: DC | PRN
Start: 2020-08-08 — End: 2020-08-08

## 2020-08-08 MED ORDER — FENTANYL CITRATE (PF) 250 MCG/5ML IJ SOLN
INTRAMUSCULAR | Status: DC | PRN
  Administered 2020-08-08 (×3): 50 ug via INTRAVENOUS

## 2020-08-08 MED ORDER — FENTANYL CITRATE (PF) 100 MCG/2ML IJ SOLN
INTRAMUSCULAR | Status: AC
  Filled 2020-08-08: qty 2

## 2020-08-08 MED ORDER — EPHEDRINE SULFATE 50 MG/ML IJ SOLN
INTRAMUSCULAR | Status: DC | PRN
  Administered 2020-08-08: 10 mg via INTRAVENOUS
  Administered 2020-08-08: 5 mg via INTRAVENOUS

## 2020-08-08 MED ORDER — LIDOCAINE 2% (20 MG/ML) 5 ML SYRINGE
INTRAMUSCULAR | Status: DC | PRN
  Administered 2020-08-08: 60 mg via INTRAVENOUS

## 2020-08-08 MED ORDER — AMISULPRIDE (ANTIEMETIC) 5 MG/2ML IV SOLN: 10.0000 mg | Freq: Once | INTRAVENOUS | Status: DC | PRN

## 2020-08-08 MED ORDER — PROPOFOL 10 MG/ML IV BOLUS
INTRAVENOUS | Status: DC | PRN
  Administered 2020-08-08: 200 mg via INTRAVENOUS

## 2020-08-08 MED ORDER — 0.9 % SODIUM CHLORIDE (POUR BTL) OPTIME
TOPICAL | Status: DC | PRN
  Administered 2020-08-08: 1000 mL

## 2020-08-08 MED ORDER — METHYLENE BLUE 0.5 % INJ SOLN
INTRAVENOUS | Status: AC
  Filled 2020-08-08: qty 10

## 2020-08-08 MED ORDER — LIDOCAINE HCL 1 % IJ SOLN
INTRAMUSCULAR | Status: DC | PRN
  Administered 2020-08-08: 20 mL via INTRAMUSCULAR

## 2020-08-08 MED ORDER — PHENYLEPHRINE 40 MCG/ML (10ML) SYRINGE FOR IV PUSH (FOR BLOOD PRESSURE SUPPORT)
PREFILLED_SYRINGE | INTRAVENOUS | Status: DC | PRN
  Administered 2020-08-08 (×10): 80 ug via INTRAVENOUS

## 2020-08-08 MED ORDER — PROMETHAZINE HCL 25 MG/ML IJ SOLN: 6.2500 mg | INTRAMUSCULAR | Status: DC | PRN

## 2020-08-08 MED ORDER — CHLORHEXIDINE GLUCONATE CLOTH 2 % EX PADS: 6.0000 | MEDICATED_PAD | Freq: Once | CUTANEOUS | Status: DC

## 2020-08-08 MED ORDER — ONDANSETRON HCL 4 MG/2ML IJ SOLN
INTRAMUSCULAR | Status: DC | PRN
  Administered 2020-08-08: 4 mg via INTRAVENOUS

## 2020-08-08 MED ORDER — LIDOCAINE HCL (PF) 1 % IJ SOLN
INTRAMUSCULAR | Status: AC
  Filled 2020-08-08: qty 30

## 2020-08-08 MED ORDER — MIDAZOLAM HCL 2 MG/2ML IJ SOLN
2.0000 mg | Freq: Once | INTRAMUSCULAR | Status: AC
  Administered 2020-08-08: 2 mg via INTRAVENOUS

## 2020-08-08 MED ORDER — TECHNETIUM TC 99M TILMANOCEPT KIT
1.0000 | PACK | Freq: Once | INTRAVENOUS | Status: AC | PRN
  Administered 2020-08-08: 1 via INTRADERMAL

## 2020-08-08 MED ORDER — BUPIVACAINE-EPINEPHRINE (PF) 0.25% -1:200000 IJ SOLN
INTRAMUSCULAR | Status: AC
  Filled 2020-08-08: qty 30

## 2020-08-08 MED ORDER — FENTANYL CITRATE (PF) 100 MCG/2ML IJ SOLN
100.0000 ug | Freq: Once | INTRAMUSCULAR | Status: AC
  Administered 2020-08-08: 100 ug via INTRAVENOUS

## 2020-08-08 MED ORDER — CEFAZOLIN SODIUM-DEXTROSE 2-4 GM/100ML-% IV SOLN
2.0000 g | INTRAVENOUS | Status: AC
  Administered 2020-08-08: 2 g via INTRAVENOUS
  Filled 2020-08-08: qty 100

## 2020-08-08 MED ORDER — FENTANYL CITRATE (PF) 250 MCG/5ML IJ SOLN
INTRAMUSCULAR | Status: AC
  Filled 2020-08-08: qty 5

## 2020-08-08 MED ORDER — OXYCODONE HCL 5 MG PO TABS: 5.0000 mg | ORAL_TABLET | Freq: Once | ORAL | Status: DC | PRN

## 2020-08-08 MED ORDER — ROPIVACAINE HCL 5 MG/ML IJ SOLN
INTRAMUSCULAR | Status: DC | PRN
  Administered 2020-08-08: 30 mL via PERINEURAL

## 2020-08-08 MED ORDER — DEXAMETHASONE SODIUM PHOSPHATE 10 MG/ML IJ SOLN
INTRAMUSCULAR | Status: DC | PRN
  Administered 2020-08-08: 5 mg via INTRAVENOUS

## 2020-08-08 SURGICAL SUPPLY — 58 items
ADH SKN CLS APL DERMABOND .7 (GAUZE/BANDAGES/DRESSINGS) ×1
APL PRP STRL LF DISP 70% ISPRP (MISCELLANEOUS) ×1
BINDER BREAST LRG (GAUZE/BANDAGES/DRESSINGS) IMPLANT
BINDER BREAST XLRG (GAUZE/BANDAGES/DRESSINGS) IMPLANT
BINDER BREAST XXLRG (GAUZE/BANDAGES/DRESSINGS) ×2 IMPLANT
BNDG COHESIVE 4X5 TAN STRL (GAUZE/BANDAGES/DRESSINGS) ×3 IMPLANT
CANISTER SUCT 3000ML PPV (MISCELLANEOUS) ×3 IMPLANT
CHLORAPREP W/TINT 26 (MISCELLANEOUS) ×3 IMPLANT
CLIP VESOCCLUDE LG 6/CT (CLIP) ×3 IMPLANT
CLIP VESOCCLUDE MED 6/CT (CLIP) ×5 IMPLANT
CLIP VESOCCLUDE SM WIDE 6/CT (CLIP) ×1 IMPLANT
CLOSURE WOUND 1/2 X4 (GAUZE/BANDAGES/DRESSINGS) ×1
CNTNR URN SCR LID CUP LEK RST (MISCELLANEOUS) IMPLANT
CONT SPEC 4OZ STRL OR WHT (MISCELLANEOUS)
COVER PROBE W GEL 5X96 (DRAPES) ×3 IMPLANT
COVER SURGICAL LIGHT HANDLE (MISCELLANEOUS) ×3 IMPLANT
COVER WAND RF STERILE (DRAPES) ×1 IMPLANT
DERMABOND ADVANCED (GAUZE/BANDAGES/DRESSINGS) ×2
DERMABOND ADVANCED .7 DNX12 (GAUZE/BANDAGES/DRESSINGS) ×1 IMPLANT
DEVICE DUBIN SPECIMEN MAMMOGRA (MISCELLANEOUS) ×2 IMPLANT
DRAPE CHEST BREAST 15X10 FENES (DRAPES) ×1 IMPLANT
DRAPE SURG 17X23 STRL (DRAPES) IMPLANT
DRSG PAD ABDOMINAL 8X10 ST (GAUZE/BANDAGES/DRESSINGS) ×2 IMPLANT
ELECT COATED BLADE 2.86 ST (ELECTRODE) ×3 IMPLANT
ELECT NDL BLADE 2-5/6 (NEEDLE) ×1 IMPLANT
ELECT NEEDLE BLADE 2-5/6 (NEEDLE) IMPLANT
ELECT REM PT RETURN 9FT ADLT (ELECTROSURGICAL) ×3
ELECTRODE REM PT RTRN 9FT ADLT (ELECTROSURGICAL) ×1 IMPLANT
GAUZE SPONGE 4X4 12PLY STRL (GAUZE/BANDAGES/DRESSINGS) ×2 IMPLANT
GLOVE SURG ENC MOIS LTX SZ6 (GLOVE) ×1 IMPLANT
GLOVE SURG UNDER LTX SZ6.5 (GLOVE) ×3 IMPLANT
GOWN STRL REUS W/ TWL LRG LVL3 (GOWN DISPOSABLE) ×1 IMPLANT
GOWN STRL REUS W/TWL 2XL LVL3 (GOWN DISPOSABLE) ×3 IMPLANT
GOWN STRL REUS W/TWL LRG LVL3 (GOWN DISPOSABLE) ×6
KIT BASIN OR (CUSTOM PROCEDURE TRAY) ×3 IMPLANT
KIT MARKER MARGIN INK (KITS) ×3 IMPLANT
LIGHT WAVEGUIDE WIDE FLAT (MISCELLANEOUS) ×2 IMPLANT
NDL 18GX1X1/2 (RX/OR ONLY) (NEEDLE) IMPLANT
NDL FILTER BLUNT 18X1 1/2 (NEEDLE) IMPLANT
NDL HYPO 25GX1X1/2 BEV (NEEDLE) ×1 IMPLANT
NEEDLE 18GX1X1/2 (RX/OR ONLY) (NEEDLE) ×3 IMPLANT
NEEDLE FILTER BLUNT 18X 1/2SAF (NEEDLE) ×2
NEEDLE FILTER BLUNT 18X1 1/2 (NEEDLE) ×1 IMPLANT
NEEDLE HYPO 25GX1X1/2 BEV (NEEDLE) ×3 IMPLANT
NS IRRIG 1000ML POUR BTL (IV SOLUTION) ×3 IMPLANT
PACK GENERAL/GYN (CUSTOM PROCEDURE TRAY) ×3 IMPLANT
PACK UNIVERSAL I (CUSTOM PROCEDURE TRAY) ×3 IMPLANT
PENCIL SMOKE EVACUATOR (MISCELLANEOUS) ×2 IMPLANT
SPONGE T-LAP 18X18 ~~LOC~~+RFID (SPONGE) ×6 IMPLANT
STOCKINETTE IMPERVIOUS 9X36 MD (GAUZE/BANDAGES/DRESSINGS) ×3 IMPLANT
STRIP CLOSURE SKIN 1/2X4 (GAUZE/BANDAGES/DRESSINGS) ×1 IMPLANT
SUT MNCRL AB 4-0 PS2 18 (SUTURE) ×3 IMPLANT
SUT VIC AB 2-0 SH 27 (SUTURE) ×6
SUT VIC AB 2-0 SH 27XBRD (SUTURE) IMPLANT
SUT VIC AB 3-0 SH 8-18 (SUTURE) ×3 IMPLANT
SYR CONTROL 10ML LL (SYRINGE) ×3 IMPLANT
TOWEL GREEN STERILE (TOWEL DISPOSABLE) ×3 IMPLANT
TOWEL GREEN STERILE FF (TOWEL DISPOSABLE) ×3 IMPLANT

## 2020-08-08 NOTE — Anesthesia Preprocedure Evaluation (Signed)
Anesthesia Evaluation  Patient identified by MRN, date of birth, ID band Patient awake    Reviewed: NPO status , Patient's Chart, lab work & pertinent test results  Airway Mallampati: II  TM Distance: >3 FB Neck ROM: Full    Dental  (+) Teeth Intact   Pulmonary asthma ,    Pulmonary exam normal        Cardiovascular + DOE   Rhythm:Regular Rate:Normal     Neuro/Psych  Headaches,  Neuromuscular disease (MS dx 2009) negative psych ROS   GI/Hepatic negative GI ROS, Neg liver ROS,   Endo/Other  negative endocrine ROS  Renal/GU negative Renal ROS  negative genitourinary   Musculoskeletal  (+) Arthritis , Osteoarthritis,  Right IDC   Abdominal (+) + obese,  Abdomen: soft. Bowel sounds: normal.  Peds  Hematology  (+) anemia ,   Anesthesia Other Findings Breast Cancer  Reproductive/Obstetrics                             Anesthesia Physical  Anesthesia Plan  ASA: III  Anesthesia Plan: General   Post-op Pain Management:  Regional for Post-op pain   Induction: Intravenous  PONV Risk Score and Plan: 3 and Ondansetron, Dexamethasone, Midazolam and Treatment may vary due to age or medical condition  Airway Management Planned: LMA  Additional Equipment: None  Intra-op Plan:   Post-operative Plan: Extubation in OR  Informed Consent: I have reviewed the patients History and Physical, chart, labs and discussed the procedure including the risks, benefits and alternatives for the proposed anesthesia with the patient or authorized representative who has indicated his/her understanding and acceptance.     Dental advisory given  Plan Discussed with: CRNA  Anesthesia Plan Comments: (ECHO 12/21: Left ventricular ejection fraction, by estimation, is 55 to 60%. The left ventricle has normal function. The left ventricle has no regional wall motion abnormalities. Left ventricular diastolic  parameters are consistent with Grade I diastolic dysfunction (impaired relaxation). The average left ventricular global longitudinal strain is -18.1 %. The global longitudinal strain is normal. 2. Right ventricular systolic function is normal. The right ventricular size is normal. 3. The mitral valve is normal in structure. No evidence of mitral valve regurgitation. No evidence of mitral stenosis. 4. The aortic valve is normal in structure. Aortic valve regurgitation is not visualized. No aortic stenosis is present. 5. The inferior vena cava is normal in size with greater than 50% respiratory variability, suggesting right atrial pressure of 3 mmHg.)        Anesthesia Quick Evaluation

## 2020-08-08 NOTE — Progress Notes (Signed)
Nuc Med at bedside to do right side breast injections.  VSS and pt tolerated procedure well.

## 2020-08-08 NOTE — Telephone Encounter (Addendum)
MRI brain w/wo contrast & cervical spine w/wo contrast UHC auth: NPR  Scheduled at [facility] 09/19/20 at 9:00 am.

## 2020-08-08 NOTE — Transfer of Care (Signed)
Immediate Anesthesia Transfer of Care Note  Patient: Jacqueline Adams  Procedure(s) Performed: RIGHT BREAST LUMPECTOMY WITH RADIOACTIVE SEED X 2 AND SENTINEL LYMPH NODE BIOPSY (Right: Breast)  Patient Location: PACU  Anesthesia Type:General  Level of Consciousness: drowsy  Airway & Oxygen Therapy: Patient Spontanous Breathing and Patient connected to face mask oxygen  Post-op Assessment: Report given to RN and Post -op Vital signs reviewed and stable  Post vital signs: Reviewed and stable  Last Vitals:  Vitals Value Taken Time  BP 115/73 08/08/20 1408  Temp    Pulse 85 08/08/20 1409  Resp 15 08/08/20 1409  SpO2 100 % 08/08/20 1409  Vitals shown include unvalidated device data.  Last Pain:  Vitals:   08/08/20 0940  TempSrc:   PainSc: 0-No pain      Patients Stated Pain Goal: 2 (41/93/79 0240)  Complications: No notable events documented.

## 2020-08-08 NOTE — Anesthesia Procedure Notes (Signed)
Procedure Name: LMA Insertion Date/Time: 08/08/2020 11:58 AM Performed by: Donnelly Angelica, RN Pre-anesthesia Checklist: Patient identified, Emergency Drugs available, Suction available and Patient being monitored Patient Re-evaluated:Patient Re-evaluated prior to induction Oxygen Delivery Method: Circle System Utilized Preoxygenation: Pre-oxygenation with 100% oxygen Induction Type: IV induction Ventilation: Mask ventilation without difficulty LMA: LMA inserted LMA Size: 4.0 Number of attempts: 1 Airway Equipment and Method: Bite block Placement Confirmation: positive ETCO2 Tube secured with: Tape Dental Injury: Teeth and Oropharynx as per pre-operative assessment

## 2020-08-08 NOTE — Anesthesia Postprocedure Evaluation (Signed)
Anesthesia Post Note  Patient: [name]  Procedure(s) Performed: RIGHT BREAST LUMPECTOMY WITH RADIOACTIVE SEED X 3 AND SENTINEL LYMPH NODE BIOPSY (Right: Breast)     Patient location during evaluation: PACU Anesthesia Type: General Level of consciousness: awake and alert Pain management: pain level controlled Vital Signs Assessment: post-procedure vital signs reviewed and stable Respiratory status: spontaneous breathing, nonlabored ventilation and respiratory function stable Cardiovascular status: blood pressure returned to baseline and stable Postop Assessment: no apparent nausea or vomiting Anesthetic complications: no   No notable events documented.  Last Vitals:  Vitals:   08/08/20 1423 08/08/20 1438  BP: 118/71 120/75  Pulse: 78 76  Resp: (!) 22 20  Temp:  36.5 C  SpO2: 99% 97%    Last Pain:  Vitals:   08/08/20 1438  TempSrc:   PainSc: 0-No pain                 Lynda Rainwater

## 2020-08-08 NOTE — Progress Notes (Signed)
Assisted Dr. Miller with right, ultrasound guided, pectoralis block. Side rails up, monitors on throughout procedure. See vital signs in flow sheet. Tolerated Procedure well. 

## 2020-08-08 NOTE — Op Note (Signed)
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 The risks, benefits, complications, treatment options, and expected outcomes were discussed with the patient. The possibilities of bleeding, infection, the need for additional procedures, failure to diagnose a condition, and creating a complication requiring transfusion or operation were discussed with the patient. The patient concurred with the proposed plan, giving informed consent.  The site of surgery properly noted/marked. The patient was taken to Operating Room # 2, identified, and the procedure verified as right breast seed bracketed lumpectomy with sentinel lymph node biopsy. A Time Out was held and the above information confirmed. The methylene blue was injected into the subareolar position  The right arm, breast, and chest were prepped and draped in standard fashion. The lumpectomy was performed by creating a lateral incision near the previously placed radioactive seeds.  Dissection was carried down to around the points of maximum signal intensity. The cautery was used to perform the dissection.  Hemostasis was achieved with cautery. The edges of the cavity were marked with large clips, with one each medial, lateral, inferior and superior, and two clips posteriorly.   The specimen was inked with the margin marker paint kit.    Specimen radiography confirmed inclusion of the mammographic lesions, the clips, and the seeds.  The background signal in the  breast was zero.  The wound was irrigated and closed with 3-0 vicryl in layers and 4-0 monocryl subcuticular suture.    Using a hand-held gamma probe, right axillary sentinel nodes were identified transcutaneously.  An oblique incision was created below the axillary hairline.  Dissection was carried through the clavipectoral fascia.  Three deep level two axillary sentinel nodes were removed.  Counts per second were 20, 1910, and 295.    The background count was 15 cps.  The wound was irrigated.  Hemostasis was achieved with cautery.  The axillary incision was closed with a 3-0 vicryl deep dermal interrupted sutures and a 4-0 monocryl subcuticular closure.    Sterile dressings were applied. At the end of the operation, all sponge, instrument, and needle counts were correct.  Findings: grossly clear surgical margins and no adenopathy, posterior margin is pectoralis, anterior margin is skin  Estimated Blood Loss:  min         Specimens: right breast tissue with seed and three deep right axillary sentinel lymph nodes.             Complications:  None; patient tolerated the procedure well.         Disposition: PACU - hemodynamically stable.         Condition: stable

## 2020-08-08 NOTE — Anesthesia Procedure Notes (Signed)
Anesthesia Regional Block: Pectoralis block   Pre-Anesthetic Checklist: , timeout performed,  Correct Patient, Correct Site, Correct Laterality,  Correct Procedure, Correct Position, site marked,  Risks and benefits discussed,  Surgical consent,  Pre-op evaluation,  At surgeon's request and post-op pain management  Laterality: Right  Prep: chloraprep       Needles:  Injection technique: Single-shot  Needle Type: Stimiplex     Needle Length: 9cm  Needle Gauge: 21     Additional Needles:   Procedures:,,,, ultrasound used (permanent image in chart),,    Narrative:  Start time: 08/08/2020 10:14 AM End time: 08/08/2020 10:19 AM Injection made incrementally with aspirations every 5 mL.  Performed by: Personally  Anesthesiologist: Lynda Rainwater, MD

## 2020-08-08 NOTE — H&P (Signed)
Jacqueline Adams Location: [facility] Patient #: [id number] DOB: May 25, 1960 Undefined / Language: Cleophus Molt / Race: Black or African American Female   History of Present Illness  The patient is a 60 year old female who presents for a follow-up for Breast cancer. Pt is a lovely 60 yo F with known BRCA 2 defect who presented with right breast cancer 12/2019.  She had been getting alternating mammograms and MRIs.  She had a negative mammogram 03/2019.  She felt a mass around september/october 2021. 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  She subsequently underwent MRI and had an additional 6 mm mass near the other two.  This was invasive ductal carcinoma as well.  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  Prognostic markers were not performed on the third cancer, and there was not sufficient tissue to do markers on the DCIS.    She saw Drs. [place], [place], and [place] in the [facility].  She got a port and started chemotherapy.  She wanted to switch to a female physician and came to see me.  Her sister also has BRCA2.  Of note, the patient has multiple sclerosis.    She has significant family cancer history as expected with BRCA2.  Her mother had breast and ovarian cancer, but is still alive at age 58.  Sisters have had colon cancer, and ovarian cancer.  three maternal aunts have had breast cancer, and maternal grandmother had some form of female reproductive cancer.  Father had prostate cancer.  Another sister had lung cancer.    She is a G3P2 with menarche at age 17, menopause in early 39s, and first child born in early 48s.  She breast fed for 6-12 months between her children.    She underwent MRI and had several additional biopsies.  These are all in the same quadrant.  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  She had been receiving  neoadjuvant chemotherapy.  MRI is set up, but hasn't been performed yet.  She has been doing Ok other than bilateral toe infections.  She is currently on keflex for that.       pathology 12/31/2019 1. Breast, right, needle core biopsy, right breast 10:30 - INVASIVE DUCTAL CARCINOMA. DUCTAL CARCINOMA IN SITU. Grade 2 The tumor cells are NEGATIVE for Her2 (1+). Estrogen Receptor: 0%, NEGATIVE Progesterone Receptor: 0%, NEGATIVE Proliferation Marker Ki67: 80%  2. Breast, right, needle core biopsy, right breast 10:30, grade 3 - INVASIVE DUCTAL CARCINOMA. Estrogen Receptor: 0%, NEGATIVE Progesterone Receptor: 0%, NEGATIVE Proliferation Marker Ki67: 75% GROUP 5: HER2 **NEGATIVE**  MR breast bilat 01/14/2020 EXAM: BILATERAL BREAST MRI WITH AND WITHOUT CONTRAST  TECHNIQUE: Multiplanar, multisequence MR images of both breasts were obtained prior to and following the intravenous administration of 10 ml of Gadavist  Three-dimensional MR images were rendered by post-processing of the original MR data on an independent workstation. The three-dimensional MR images were interpreted, and findings are reported in the following complete MRI report for this study. Three dimensional images were evaluated at the independent interpreting workstation using the DynaCAD thin client.  COMPARISON:  Previous exam(s).  FINDINGS: Breast composition: c. Heterogeneous fibroglandular tissue.  Background parenchymal enhancement: Moderate.  Right breast:  1. There is a ring-enhancing irregular spiculated mass in the anterior third of the upper-outer quadrant with signal void artifact from the biopsy  marker clip measuring 2.0 x 2.0 x 1.9 cm (series 6 image 102/208).  2. There is an irregular ring-enhancing mass in the posterior third of the upper-outer quadrant of the right breast with a signal void artifact from the biopsy marker clip measuring 2.1 x 1.9 x 1.8 cm (series 6, image 85/208).  3.  There is a ring-enhancing mass in the upper-outer quadrant measuring 6 x 4 x 7 mm (series 6, image 102/208). It is located 1.8 cm lateral and posterior to the anterior biopsied mass.  Left breast: No mass or abnormal enhancement.  Lymph nodes: No abnormal appearing lymph nodes.  Ancillary findings:  None.  IMPRESSION: 2.0 cm spiculated mass in the anterior third of the upper outer quadrant of the right breast and 2.1 cm spiculated mass in the posterior third of the upper-outer quadrant of the right breast corresponding with the biopsy proven malignancies. There is a 7 mm suspicious enhancing mass 1.8 cm lateral and posterior to the biopsied anterior mass.   dx mammo/us 01/03/2020 FINDINGS: There is a mass in the region of the patient's palpable lump in the upper outer posterior right breast. There is a spiculated mass more anteriorly in the upper outer right breast. This spiculated mass measures up to 1.8 cm. There may be some obscured adjacent nodularity. There are calcifications in the tissue between the 2 masses in the upper outer right breast. No other suspicious mammographic findings in either breast.  Mammographic images were processed with CAD.  On physical exam, there is a lump in the upper outer posterior right breast.  Targeted ultrasound is performed, showing an irregular suspicious mass in the right breast at 10:30, 2 cm from the nipple measuring 1.3 x 1.8 by 11.1 cm. A few mildly complicated cysts are seen in the adjacent tissue. The palpable lump correlates with the mass seen at 10:30, 7 cm from the nipple containing a calcification measuring 1.6 x 1.5 x 1.5 cm. No adenopathy.  IMPRESSION: Two suspicious masses in the upper outer right breast at 10 30, 2 cm from the nipple and 10 30, 7 cm from the nipple. There are calcifications between these 2 masses which are indeterminate. No adenopathy identified. No suspicious findings otherwise seen. breast density  C.     Allergies  Procaine HCl *LOCAL ANESTHETICS-Parenteral*   Swelling. Relafen *ANALGESICS - ANTI-INFLAMMATORY*   Swelling. Allergies Reconciled    Medication History  Cephalexin  (500MG Capsule, Oral) Active. Flonase  (50MCG/DOSE Inhaler, Nasal) Active. Avonex Pen  (30MCG/0.5ML Auto-inj Kit, Intramuscular) Active. Multiple Vitamin  (1 (one) Oral) Active. Zocor  (20MG Tablet, Oral) Active. Topamax  (25MG Tablet, Oral) Active. Ventolin HFA  (108 (90 Base)MCG/ACT Aerosol Soln, Inhalation) Active. Vitamin D  (1000UNIT Tablet, Oral) Active. Medications Reconciled     Review of Systems All other systems negative   Physical Exam General Mental Status - Alert. General Appearance - Consistent with stated age. Hydration - Well hydrated. Voice - Normal.  Head and Neck Head - normocephalic, atraumatic with no lesions or palpable masses.  Eye Sclera/Conjunctiva - Bilateral - No scleral icterus.  Chest and Lung Exam Chest and lung exam reveals  - quiet, even and easy respiratory effort with no use of accessory muscles. Inspection Chest Wall - Normal. Back - normal.  Breast Note:  no palpable masses in either breast at this time. no nipple retraction. no lad   Cardiovascular Cardiovascular examination reveals  - normal pedal pulses bilaterally. Note:  regular rate and rhythm  Abdomen Inspection -  Inspection Normal. Palpation/Percussion Palpation and Percussion of the abdomen reveal - Soft, Non Tender, No Rebound tenderness, No Rigidity (guarding) and No hepatosplenomegaly.  Peripheral Vascular Upper Extremity Inspection - Bilateral - Normal - No Clubbing, No Cyanosis, No Edema, Pulses Intact. Lower Extremity Palpation - Edema - Bilateral - No edema - Bilateral.  Neurologic Neurologic evaluation reveals  - alert and oriented x 3 with no impairment of recent or remote memory. Mental Status - Normal.  Musculoskeletal Global Assessment  - Note:  no gross  deformities.  Normal Exam - Left - Upper Extremity Strength Normal and Lower Extremity Strength Normal. Normal Exam - Right - Upper Extremity Strength Normal and Lower Extremity Strength Normal. Note:  great toes dressed and sore   Lymphatic Head & Neck  General Head & Neck Lymphatics: Bilateral - Description - Normal. Axillary  General Axillary Region: Bilateral - Description - Normal. Tenderness - Non Tender.    Assessment & Plan  INVASIVE DUCTAL CARCINOMA OF RIGHT BREAST IN FEMALE (C50.911) Impression: Pt is having MRI next week. If there are no surprise findings, will plan breast conservation with bracketed lumpectomy.  The DCIS appears to be in between the areas of cancer. These are all in the same quadrant.  The surgical procedure was described to the patient. I discussed the incision type and location and that we would need radiology involved on with a wire or seed marker and/or sentinel node.  The risks and benefits of the procedure were described to the patient and she wishes to proceed.  We discussed the risks bleeding, infection, damage to other structures, need for further procedures/surgeries. We discussed the risk of seroma. The patient was advised if the area in the breast in cancer, we may need to go back to surgery for additional tissue to obtain negative margins or for a lymph node biopsy. The patient was advised that these are the most common complications, but that others can occur as well. They were advised against taking aspirin or other anti-inflammatory agents/blood thinners the week before surgery. Current Plans You are being scheduled for surgery - Our schedulers will call you.   You should hear from our office's scheduling department within 5 working days about the location, date, and time of surgery.  We try to make accommodations for patient's preferences in scheduling surgery, but sometimes the OR schedule or the surgeon's schedule prevents [place] from making  those accommodations.  If you have not heard from our office [phone]) in 5 working days, call the office and ask for your surgeon's nurse.  If you have other questions about your diagnosis, plan, or surgery, call the office and ask for your surgeon's nurse.  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

## 2020-08-08 NOTE — Discharge Instructions (Addendum)
[facility] Office Phone Number [phone]  BREAST BIOPSY/ PARTIAL MASTECTOMY: POST OP INSTRUCTIONS  Always review your discharge instruction sheet given to you by the facility where your surgery was performed.  IF YOU HAVE DISABILITY OR FAMILY LEAVE FORMS, YOU MUST BRING THEM TO THE OFFICE FOR PROCESSING.  DO NOT GIVE THEM TO YOUR DOCTOR.  A prescription for pain medication may be given to you upon discharge.  Take your pain medication as prescribed, if needed.  If narcotic pain medicine is not needed, then you may take acetaminophen (Tylenol) or ibuprofen (Advil) as needed. Take your usually prescribed medications unless otherwise directed If you need a refill on your pain medication, please contact your pharmacy.  They will contact our office to request authorization.  Prescriptions will not be filled after 5pm or on week-ends. You should eat very light the first 24 hours after surgery, such as soup, crackers, pudding, etc.  Resume your normal diet the day after surgery. Most patients will experience some swelling and bruising in the breast.  Ice packs and a good support bra will help.  Swelling and bruising can take several days to resolve.  It is common to experience some constipation if taking pain medication after surgery.  Increasing fluid intake and taking a stool softener will usually help or prevent this problem from occurring.  A mild laxative (Milk of Magnesia or Miralax) should be taken according to package directions if there are no bowel movements after 48 hours. Unless discharge instructions indicate otherwise, you may remove your bandages 48 hours after surgery, and you may shower at that time.  You may have steri-strips (small skin tapes) in place directly over the incision.  These strips should be left on the skin for 7-10 days.   Any sutures or staples will be removed at the office during your follow-up visit. ACTIVITIES:  You may resume regular daily activities  (gradually increasing) beginning the next day.  Wearing a good support bra or sports bra (or the breast binder) minimizes pain and swelling.  You may have sexual intercourse when it is comfortable. You may drive when you no longer are taking prescription pain medication, you can comfortably wear a seatbelt, and you can safely maneuver your car and apply brakes. RETURN TO WORK:  __________1 week_______________ You should see your doctor in the office for a follow-up appointment approximately two weeks after your surgery.  Your doctor's nurse will typically make your follow-up appointment when she calls you with your pathology report.  Expect your pathology report 2-3 business days after your surgery.  You may call to check if you do not hear from us after three days.   WHEN TO CALL YOUR DOCTOR: Fever over 101.0 Nausea and/or vomiting. Extreme swelling or bruising. Continued bleeding from incision. Increased pain, redness, or drainage from the incision.  The clinic staff is available to answer your questions during regular business hours.  Please don't hesitate to call and ask to speak to one of the nurses for clinical concerns.  If you have a medical emergency, go to the nearest emergency room or call 911.  A surgeon from [facility] is always on call at the hospital.  For further questions, please visit [email or web address]   

## 2020-08-08 NOTE — Interval H&P Note (Signed)
History and Physical Interval Note:  08/08/2020 11:44 AM  Jacqueline Adams  has presented today for surgery, with the diagnosis of RIGHT BREAST CANCER.  The various methods of treatment have been discussed with the patient and family. After consideration of risks, benefits and other options for treatment, the patient has consented to  Procedure(s): RIGHT BREAST LUMPECTOMY WITH RADIOACTIVE SEED X 2 AND SENTINEL LYMPH NODE BIOPSY (Right) as a surgical intervention.  The patient's history has been reviewed, patient examined, no change in status, stable for surgery.  I have reviewed the patient's chart and labs.  Questions were answered to the patient's satisfaction.     Stark Klein

## 2020-08-09 ENCOUNTER — Emergency Department (HOSPITAL_COMMUNITY)
Admission: EM | Admit: 2020-08-09 | Discharge: 2020-08-09 | Disposition: A | Discharge status: Home / Self Care | Payer: 59 | Attending: Emergency Medicine | Admitting: Emergency Medicine

## 2020-08-09 ENCOUNTER — Emergency Department (HOSPITAL_COMMUNITY): Payer: 59

## 2020-08-09 ENCOUNTER — Encounter (HOSPITAL_COMMUNITY): Payer: Self-pay | Admitting: Emergency Medicine

## 2020-08-09 ENCOUNTER — Other Ambulatory Visit: Payer: Self-pay

## 2020-08-09 DIAGNOSIS — D72829 Elevated white blood cell count, unspecified: Secondary | ICD-10-CM | POA: Insufficient documentation

## 2020-08-09 DIAGNOSIS — I517 Cardiomegaly: Secondary | ICD-10-CM | POA: Diagnosis not present

## 2020-08-09 DIAGNOSIS — J45991 Cough variant asthma: Secondary | ICD-10-CM | POA: Insufficient documentation

## 2020-08-09 DIAGNOSIS — S0990XA Unspecified injury of head, initial encounter: Secondary | ICD-10-CM | POA: Diagnosis present

## 2020-08-09 DIAGNOSIS — Z452 Encounter for adjustment and management of vascular access device: Secondary | ICD-10-CM | POA: Diagnosis not present

## 2020-08-09 DIAGNOSIS — Z79899 Other long term (current) drug therapy: Secondary | ICD-10-CM | POA: Diagnosis not present

## 2020-08-09 DIAGNOSIS — E860 Dehydration: Secondary | ICD-10-CM | POA: Diagnosis not present

## 2020-08-09 DIAGNOSIS — T8131XA Disruption of external operation (surgical) wound, not elsewhere classified, initial encounter: Secondary | ICD-10-CM | POA: Insufficient documentation

## 2020-08-09 DIAGNOSIS — G35 Multiple sclerosis: Secondary | ICD-10-CM | POA: Insufficient documentation

## 2020-08-09 DIAGNOSIS — W01190A Fall on same level from slipping, tripping and stumbling with subsequent striking against furniture, initial encounter: Secondary | ICD-10-CM | POA: Diagnosis not present

## 2020-08-09 DIAGNOSIS — C50411 Malignant neoplasm of upper-outer quadrant of right female breast: Secondary | ICD-10-CM | POA: Insufficient documentation

## 2020-08-09 DIAGNOSIS — R110 Nausea: Secondary | ICD-10-CM | POA: Insufficient documentation

## 2020-08-09 DIAGNOSIS — R519 Headache, unspecified: Secondary | ICD-10-CM | POA: Diagnosis not present

## 2020-08-09 DIAGNOSIS — Y92002 Bathroom of unspecified non-institutional (private) residence single-family (private) house as the place of occurrence of the external cause: Secondary | ICD-10-CM | POA: Insufficient documentation

## 2020-08-09 DIAGNOSIS — Z9889 Other specified postprocedural states: Secondary | ICD-10-CM | POA: Diagnosis not present

## 2020-08-09 DIAGNOSIS — R55 Syncope and collapse: Secondary | ICD-10-CM | POA: Insufficient documentation

## 2020-08-09 LAB — BASIC METABOLIC PANEL
Anion gap: 8 (ref 5–15)
BUN: 8 mg/dL (ref 6–20)
CO2: 25 mmol/L (ref 22–32)
Calcium: 8.9 mg/dL (ref 8.9–10.3)
Chloride: 105 mmol/L (ref 98–111)
Creatinine, Ser: 0.74 mg/dL (ref 0.44–1.00)
GFR, Estimated: >60 mL/min (ref >60)
Glucose, Bld: 164 mg/dL — ABNORMAL HIGH (ref 70–99)
Potassium: 3.8 mmol/L (ref 3.5–5.1)
Sodium: 138 mmol/L (ref 135–145)

## 2020-08-09 LAB — CBC WITH DIFFERENTIAL/PLATELET
Abs Immature Granulocytes: 0.1 10*3/uL — ABNORMAL HIGH (ref 0.00–0.07)
Basophils Absolute: 0 10*3/uL (ref 0.0–0.1)
Basophils Relative: 0 %
Eosinophils Absolute: 0 10*3/uL (ref 0.0–0.5)
Eosinophils Relative: 0 %
HCT: 30.4 % — ABNORMAL LOW (ref 36.0–46.0)
Hemoglobin: 9.8 g/dL — ABNORMAL LOW (ref 12.0–15.0)
Immature Granulocytes: 1 %
Lymphocytes Relative: 9 %
Lymphs Abs: 1.6 10*3/uL (ref 0.7–4.0)
MCH: 27.9 pg (ref 26.0–34.0)
MCHC: 32.2 g/dL (ref 30.0–36.0)
MCV: 86.6 fL (ref 80.0–100.0)
Monocytes Absolute: 0.9 10*3/uL (ref 0.1–1.0)
Monocytes Relative: 5 %
Neutro Abs: 16.1 10*3/uL — ABNORMAL HIGH (ref 1.7–7.7)
Neutrophils Relative %: 85 %
Platelets: 263 10*3/uL (ref 150–400)
RBC: 3.51 MIL/uL — ABNORMAL LOW (ref 3.87–5.11)
RDW: 17.4 % — ABNORMAL HIGH (ref 11.5–15.5)
WBC: 18.8 10*3/uL — ABNORMAL HIGH (ref 4.0–10.5)
nRBC: 0 % (ref 0.0–0.2)

## 2020-08-09 LAB — D-DIMER, QUANTITATIVE: D-Dimer, Quant: 1.73 ug/mL-FEU — ABNORMAL HIGH (ref 0.00–0.50)

## 2020-08-09 LAB — TROPONIN I (HIGH SENSITIVITY)
Troponin I (High Sensitivity): 5 ng/L (ref <18)
Troponin I (High Sensitivity): 6 ng/L (ref <18)

## 2020-08-09 MED ORDER — IOHEXOL 350 MG/ML SOLN
50.0000 mL | Freq: Once | INTRAVENOUS | Status: AC | PRN
  Administered 2020-08-09: 50 mL via INTRAVENOUS

## 2020-08-09 MED ORDER — CHLORHEXIDINE GLUCONATE CLOTH 2 % EX PADS: 6.0000 | MEDICATED_PAD | Freq: Every day | CUTANEOUS | Status: DC

## 2020-08-09 MED ORDER — IBUPROFEN 400 MG PO TABS
400.0000 mg | ORAL_TABLET | Freq: Once | ORAL | Status: AC
  Administered 2020-08-09: 400 mg via ORAL
  Filled 2020-08-09: qty 1

## 2020-08-09 MED ORDER — SODIUM CHLORIDE 0.9% FLUSH: 10.0000 mL | INTRAVENOUS | Status: DC | PRN

## 2020-08-09 MED ORDER — SODIUM CHLORIDE 0.9 % IV BOLUS
1000.0000 mL | Freq: Once | INTRAVENOUS | Status: AC
  Administered 2020-08-09: 1000 mL via INTRAVENOUS

## 2020-08-09 MED ORDER — HEPARIN SOD (PORK) LOCK FLUSH 100 UNIT/ML IV SOLN
500.0000 [IU] | INTRAVENOUS | Status: AC | PRN
  Administered 2020-08-09: 500 [IU]
  Filled 2020-08-09: qty 5

## 2020-08-09 NOTE — ED Provider Notes (Signed)
Emergency Medicine Provider Triage Evaluation Note  TALOR DESROSIERS , a 60 y.o. female  was evaluated in triage.  Hx of lumpectomy earlier on 08/08/20.  Pt complains of syncope at 2345 tonight. Had walked up the stairs to use the bathroom. Shortly after voiding and getting up to walk out of the bathroom, patient felt "hot" and "nauseated". She next noted her family to be standing over her. She has some discomfort at her surgical site, but no drainage, bleeding, obvious bruising. Denies chest pain or SOB preceding her syncopal episode. No recent medication changes. Denies use of narcotics post procedure.  Review of Systems  Positive: Syncope Negative: CP, SOB  Physical Exam  BP 116/75 (BP Location: Left Arm)   Pulse (!) 102   Temp 98.3 F (36.8 C) (Oral)   Resp 16   SpO2 98%  Gen:   Awake, no distress   Resp:  Normal effort  MSK:   Moves extremities without difficulty  Other:  Surgical site appears C/D/I  Medical Decision Making  Medically screening exam initiated at 2:24 AM.  Appropriate orders placed.  [name] was informed that the remainder of the evaluation will be completed by another provider, this initial triage assessment does not replace that evaluation, and the importance of remaining in the ED until their evaluation is complete.  Syncope   Antonietta Breach, PA-C 23/30/07 [id number]    Delora Fuel, MD 33/35/45 [phone]

## 2020-08-09 NOTE — ED Notes (Signed)
Pt provided with ginger ale for PO challenge. Pt assisted to bathroom. Pt able to ambulate on her own power with 2 assist for safety only. Pt gait slow but steady. Pt denies dizziness while ambulating

## 2020-08-09 NOTE — ED Notes (Signed)
Patient transported to CT 

## 2020-08-09 NOTE — Progress Notes (Signed)
PIV consult for CTA: called radiology who confirmed implanted port could be used. Port accessed.

## 2020-08-09 NOTE — ED Triage Notes (Signed)
Pt has surgery earlier today, tonight she had a syncope episode after using the bathroom.  She fell and hit the left side of her forehead and her right chest at the surgery site.

## 2020-08-09 NOTE — ED Notes (Signed)
Pt very unpleasantly states "I am ready to get out of here. I am tired of all this foolishness.". Will prepare pt for discharge.

## 2020-08-09 NOTE — ED Provider Notes (Addendum)
[facility] EMERGENCY DEPARTMENT Provider Note   CSN: [id number] Arrival date & time: 08/09/20  0153     History Chief Complaint  Patient presents with   Loss of Consciousness   Bloomer is a 60 y.o. female.  Patient with a history of breast cancer, multiple sclerosis, Port-A-Cath in place here with syncopal episode.  Patient underwent lymph node resection and lumpectomy by Dr. Barry Dienes earlier today.  Upon returning home she went upstairs to use the bathroom and urinated.  After getting off the toilet she began to feel hot and nauseated.  The next thing she knows she was on the ground.  Denies feeling dizzy or lightheaded.  Denies any chest pain or shortness of breath.  States she struck her head on a cabinet that had some boxes on it.  Also landed on her right breast where she had surgery today.  Think she lost consciousness.  Has pain to her left head and right breast currently.  No difficulty breathing.  No cough or fever.  No bleeding from her surgical site.  Does not take any blood thinners. Admits that she did not eat breakfast or lunch today to her surgery but did eat when she got home.  Has never passed out previously.  No history of narcotic use after her procedure.  No recent medication changes. Remembers her family standing over her and does not know what happened after she left the bathroom  The history is provided by the patient.  Loss of Consciousness Associated symptoms: headaches, nausea and weakness   Associated symptoms: no chest pain, no dizziness, no shortness of breath and no vomiting   Fall Associated symptoms include headaches. Pertinent negatives include no chest pain, no abdominal pain and no shortness of breath.      Past Medical History:  Diagnosis Date   Breast cancer ([place]) 01/2020   right breast IDC   Bronchitis    Cervical spondylarthritis    Headache(784.0)    High cholesterol    Leukocytosis 2009   Multiple  sclerosis ([place]) 2009   followed by Dr. Erling Cruz   Neuromuscular disorder [facility])    Pneumonia    Rhinitis    Seasonal allergies    Sinusitis     Patient Active Problem List   Diagnosis Date Noted   Port-A-Cath in place 04/05/2020   Malignant neoplasm of upper-outer quadrant of right breast in female, estrogen receptor negative ([place]) 01/10/2020   High risk medication use 03/25/2019   Microcytic anemia 12/31/2017   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 UACS 12/16/2017   History of pneumonia 11/12/2017   Morbid obesity ([place]) 08/19/2016   Numbness 05/09/2015   Other fatigue 05/09/2015   Arthralgia of hip 11/16/2014   Low back pain with sciatica 11/16/2014   Urinary frequency 11/16/2014   Encounter for general adult medical examination without abnormal findings 06/07/2014   DOE (dyspnea on exertion) 04/27/2014   Atypical chest pain 04/27/2014   Dyslipidemia 04/27/2014   HLD (hyperlipidemia) 02/14/2014   Cervical spondylosis without myelopathy 07/28/2013   Cervical pain 07/28/2013   CFIDS (chronic fatigue and immune dysfunction syndrome) ([place]) 07/28/2013   Chronic infection of sinus 07/28/2013   Headache, migraine 07/28/2013   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/28/2013   Spasms of the hands or feet 07/28/2013   DDD (degenerative disc disease), cervical 07/28/2013   Menopausal symptom 07/28/2013   Taking drug for chronic disease 07/28/2013   Vitamin D deficiency 07/28/2013   Degeneration of  intervertebral disc of cervical region 07/28/2013   Cough 04/06/2013   BRCA gene mutation positive in female 06/12/2011   Genetic susceptibility to malignant neoplasm of breast 06/12/2011   Multiple sclerosis ([place])    Leukocytosis     Past Surgical History:  Procedure Laterality Date   ABDOMINAL HYSTERECTOMY     CESAREAN SECTION     x 3   CORRECTION HAMMER TOE Bilateral    PARTIAL HYSTERECTOMY  2006   for uterine fibroid.    PORTACATH PLACEMENT Right 02/22/2020   Procedure: RIGHT SUBCLAVIAN VEIN  PORT PLACEMENT;  Surgeon: Donnie Mesa, MD;  Location: [place];  Service: General;  Laterality: Right;     OB History   No obstetric history on file.     Family History  Problem Relation Age of Onset   Cancer Mother 37       ovarian cancer; now with suspected breast cancer   Diabetes Mother        also HTN   Breast cancer Mother    Clotting disorder Father    Prostate cancer Father    Breast cancer Maternal Aunt    Cancer Maternal Grandmother        unknown female reproductive organ cancer   Breast cancer Maternal Aunt    Breast cancer Maternal Aunt    Colon cancer Sister 86       anal cancer (2014)   Lung cancer Sister 66   Stroke Sister 6   Ovarian cancer Sister 11   BRCA 1/2 Sister     Social History   Tobacco Use   Smoking status: Never   Smokeless tobacco: Never  Vaping Use   Vaping Use: Never used  Substance Use Topics   Alcohol use: No   Drug use: No    Home Medications Prior to Admission medications   Medication Sig Start Date End Date Taking? Authorizing Provider  Calcium Carb-Cholecalciferol (CALCIUM 600+D3 PO) Take 1 tablet by mouth in the morning.    [provider]  cholecalciferol (VITAMIN D) 25 MCG (1000 UNIT) tablet Take 2,000 Units by mouth in the morning.    [provider]  Ferrous Sulfate Dried (SLOW RELEASE IRON) 45 MG TBCR Take 45 mg by mouth in the morning.    [provider]  fexofenadine (ALLEGRA) 180 MG tablet Take 180 mg by mouth daily as needed for allergies.    [provider]  fluticasone (FLONASE) 50 MCG/ACT nasal spray Place 2 sprays into both nostrils daily. Patient taking differently: Place 2 sprays into both nostrils daily as needed for allergies. 04/05/13   Brand Males, MD  ibuprofen (ADVIL) 200 MG tablet Take 400 mg by mouth every Thursday. W/Avonex injection    [provider]  interferon beta-1a (AVONEX PREFILLED) 30 MCG/0.5ML PSKT injection INJECT ONE  SYRINGE INTRAMUSCULARLY ONCE WEEKLY. REFRIGERATE. PROTECT FROM LIGHT. ALLOW TO WARM TO ROOM TEMPERATURE PRIOR TO USE. Patient taking differently: Inject 30 mcg into the muscle every Thursday. INJECT ONE SYRINGE INTRAMUSCULARLY ONCE WEEKLY. REFRIGERATE. PROTECT FROM LIGHT. ALLOW TO WARM TO ROOM TEMPERATURE PRIOR TO USE. 03/23/20   Sater, Nanine Means, MD  meclizine (ANTIVERT) 25 MG tablet Take 1 tablet (25 mg total) by mouth 3 (three) times daily as needed for dizziness. 05/22/20   Carlisle Cater, PA-C  Multiple Vitamin (MULTIVITAMIN WITH MINERALS) TABS tablet Take 1 tablet by mouth every evening. Centrum    [provider]  oxyCODONE (OXY IR/ROXICODONE) 5 MG immediate release tablet Take 1 tablet (  5 mg total) by mouth every 6 (six) hours as needed for severe pain. 08/08/20   Stark Klein, MD  prochlorperazine (COMPAZINE) 10 MG tablet Take 1 tablet (10 mg total) by mouth every 6 (six) hours as needed (Nausea or vomiting). 01/12/20   Nicholas Lose, MD  simvastatin (ZOCOR) 20 MG tablet Take 20 mg by mouth every evening. 10/04/12   [provider]  VENTOLIN HFA 108 (90 BASE) MCG/ACT inhaler Inhale 1-2 puffs into the lungs every 6 (six) hours as needed for shortness of breath or wheezing. 03/31/13   [provider]    Allergies    Procaine and Relafen [nabumetone]  Review of Systems   Review of Systems  Constitutional:  Positive for fatigue. Negative for activity change and appetite change.  HENT:  Negative for congestion and rhinorrhea.   Respiratory:  Negative for cough and shortness of breath.   Cardiovascular:  Positive for syncope. Negative for chest pain.  Gastrointestinal:  Positive for nausea. Negative for abdominal pain and vomiting.  Genitourinary:  Negative for dysuria and hematuria.  Musculoskeletal:  Negative for arthralgias and myalgias.  Skin:  Negative for rash.  Neurological:  Positive for weakness and headaches. Negative for dizziness and light-headedness.    all other systems are negative except as noted in the HPI and PMH.   Physical Exam Updated Vital Signs BP 113/69 (BP Location: Left Arm)   Pulse 81   Temp 97.9 F (36.6 C) (Oral)   Resp (!) 29   SpO2 99%   Physical Exam Vitals and nursing note reviewed.  Constitutional:      General: She is not in acute distress.    Appearance: She is well-developed.     Comments: Small hematoma left forehead.  HENT:     Head: Normocephalic and atraumatic.     Ears:     Comments: No septal hematoma or hemotympanum  Abrasion right upper lip No trismus or malocclusion    Mouth/Throat:     Pharynx: No oropharyngeal exudate.  Eyes:     Conjunctiva/sclera: Conjunctivae normal.     Pupils: Pupils are equal, round, and reactive to light.  Neck:     Comments: No meningismus. Cardiovascular:     Rate and Rhythm: Normal rate and regular rhythm.     Heart sounds: Normal heart sounds. No murmur heard. Pulmonary:     Effort: Pulmonary effort is normal. No respiratory distress.     Breath sounds: Normal breath sounds.     Comments: Chaperone present Patent examiner.  Right Port-A-Cath in place, nontender.  Surgical incisions to right breast well approximated with Steri-Strips.  No bleeding or drainage.  Appropriately tender. Chest:     Chest wall: Tenderness present.  Abdominal:     Palpations: Abdomen is soft.     Tenderness: There is no abdominal tenderness. There is no guarding or rebound.  Musculoskeletal:        General: No tenderness. Normal range of motion.     Cervical back: Normal range of motion and neck supple.  Skin:    General: Skin is warm.  Neurological:     Mental Status: She is alert and oriented to person, place, and time.     Cranial Nerves: No cranial nerve deficit.     Motor: No abnormal muscle tone.     Coordination: Coordination normal.     Comments:  5/5 strength throughout. CN 2-12 intact.Equal grip strength.   Psychiatric:        Behavior: Behavior normal.  ED Results /  Procedures / Treatments   Labs (all labs ordered are listed, but only abnormal results are displayed) Labs Reviewed  CBC WITH DIFFERENTIAL/PLATELET - Abnormal; Notable for the following components:      Result Value   WBC 18.8 (*)    RBC 3.51 (*)    Hemoglobin 9.8 (*)    HCT 30.4 (*)    RDW 17.4 (*)    Neutro Abs 16.1 (*)    Abs Immature Granulocytes 0.10 (*)    All other components within normal limits  BASIC METABOLIC PANEL - Abnormal; Notable for the following components:   Glucose, Bld 164 (*)    All other components within normal limits  D-DIMER, QUANTITATIVE  TROPONIN I (HIGH SENSITIVITY)    EKG EKG Interpretation  Date/Time:  Wednesday August 09 2020 02:34:16 EDT Ventricular Rate:  81 PR Interval:  148 QRS Duration: 80 QT Interval:  430 QTC Calculation: 499 R Axis:   29 Text Interpretation: Normal sinus rhythm Cannot rule out Anterior infarct , age undetermined Abnormal ECG No previous ECGs available Confirmed by Ezequiel Essex [id number]) on 08/09/2020 3:42:09 AM  Radiology NM Sentinel Node Inj-No Rpt (Breast)  Result Date: 08/08/2020 Sulfur Colloid was injected by the Nuclear Medicine Technologist for sentinel lymph node localization.   MM Breast Surgical Specimen  Result Date: 08/08/2020 CLINICAL DATA:  Patient status post lumpectomy right breast. EXAM: SPECIMEN RADIOGRAPH OF THE RIGHT BREAST COMPARISON:  Previous exam(s). FINDINGS: Status post excision of the right breast. The radioactive seeds and biopsy marker clips are present, completely intact, and were marked for pathology. IMPRESSION: Specimen radiograph of the right breast. Electronically Signed   By: Lovey Newcomer M.D.   On: 08/08/2020 13:02  MM RT RADIOACTIVE SEED LOC MAMMO GUIDE  Result Date: 08/07/2020 CLINICAL DATA:  Four areas of known cancer in the right breast. Ultrasound-guided core biopsy of the 10 o'clock region 2 cm from the nipple (ribbon shaped clip) positive for invasive ductal carcinoma,  ultrasound-guided core biopsy of the right breast at 10 o'clock 7 cm from the nipple (coil shaped clip) positive for invasive ductal carcinoma, MR guided biopsy of a mass in the upper outer quadrant (cylindrical shaped clip) positive for invasive ductal carcinoma and stereotactic biopsy of calcifications in the upper-outer quadrant of the right breast (X shaped clip which is displaced 3 cm medially) positive for ductal carcinoma in-situ. Radioactive seed placements prior to surgery. EXAM: MAMMOGRAPHIC GUIDED RADIOACTIVE SEED LOCALIZATIONS OF THE RIGHT BREAST COMPARISON:  Previous exam(s). FINDINGS: Patient presents for radioactive seed localization prior to surgery. I met with the patient and we discussed the procedure of seed localization including benefits and alternatives. We discussed the high likelihood of a successful procedure. We discussed the risks of the procedure including infection, bleeding, tissue injury and further surgery. We discussed the low dose of radioactivity involved in the procedure. Informed, written consent was given. The usual time-out protocol was performed immediately prior to the procedure. Using mammographic guidance, sterile technique, 1% lidocaine and an I-125 radioactive seed, the coil shaped clip was localized using a lateral to medial approach. The follow-up mammogram images confirm the seed in the expected location and were marked for Dr. Barry Dienes. Follow-up survey of the patient confirms presence of the radioactive seed. Order number of I-125 seed:  [id number]. Total activity:  7.026 millicuries reference Date: 07/03/2020 The patient tolerated the procedure well and was released from the [place]. She was given instructions regarding seed removal. Using mammographic guidance, sterile technique,  1% lidocaine and an I-125 radioactive seed, the ribbon shaped clip was localized using a lateral to medial approach. The follow-up mammogram images confirm the seed in the expected  location and were marked for Dr. Barry Dienes. Follow-up survey of the patient confirms presence of the radioactive seed. Order number of I-125 seed:  [id number]. Total activity:  9.390 millicuries reference Date: 07/14/2020 The patient tolerated the procedure well and was released from the [place]. She was given instructions regarding seed removal. Using mammographic guidance, sterile technique, 1% lidocaine and an I-125 radioactive seed, the cylindrical shaped clip and calcifications was localized using a superior to inferior approach. The follow-up mammogram images confirm the seed in the expected location and were marked for Dr. Barry Dienes. Follow-up survey of the patient confirms presence of the radioactive seed. Order number of I-125 seed:  [id number]. Total activity:  7.622 millicuries reference Date: 07/03/2020 The patient tolerated the procedure well and was released from the [place]. She was given instructions regarding seed removal. IMPRESSION: Radioactive seed localizations right breast. No apparent complications. Electronically Signed   By: Lillia Mountain M.D.   On: 08/07/2020 15:50  MM RT RADIO SEED EA ADD LESION LOC MAMMO  Result Date: 08/07/2020 CLINICAL DATA:  Four areas of known cancer in the right breast. Ultrasound-guided core biopsy of the 10 o'clock region 2 cm from the nipple (ribbon shaped clip) positive for invasive ductal carcinoma, ultrasound-guided core biopsy of the right breast at 10 o'clock 7 cm from the nipple (coil shaped clip) positive for invasive ductal carcinoma, MR guided biopsy of a mass in the upper outer quadrant (cylindrical shaped clip) positive for invasive ductal carcinoma and stereotactic biopsy of calcifications in the upper-outer quadrant of the right breast (X shaped clip which is displaced 3 cm medially) positive for ductal carcinoma in-situ. Radioactive seed placements prior to surgery. EXAM: MAMMOGRAPHIC GUIDED RADIOACTIVE SEED LOCALIZATIONS OF THE RIGHT BREAST  COMPARISON:  Previous exam(s). FINDINGS: Patient presents for radioactive seed localization prior to surgery. I met with the patient and we discussed the procedure of seed localization including benefits and alternatives. We discussed the high likelihood of a successful procedure. We discussed the risks of the procedure including infection, bleeding, tissue injury and further surgery. We discussed the low dose of radioactivity involved in the procedure. Informed, written consent was given. The usual time-out protocol was performed immediately prior to the procedure. Using mammographic guidance, sterile technique, 1% lidocaine and an I-125 radioactive seed, the coil shaped clip was localized using a lateral to medial approach. The follow-up mammogram images confirm the seed in the expected location and were marked for Dr. Barry Dienes. Follow-up survey of the patient confirms presence of the radioactive seed. Order number of I-125 seed:  [id number]. Total activity:  5.638 millicuries reference Date: 07/03/2020 The patient tolerated the procedure well and was released from the [place]. She was given instructions regarding seed removal. Using mammographic guidance, sterile technique, 1% lidocaine and an I-125 radioactive seed, the ribbon shaped clip was localized using a lateral to medial approach. The follow-up mammogram images confirm the seed in the expected location and were marked for Dr. Barry Dienes. Follow-up survey of the patient confirms presence of the radioactive seed. Order number of I-125 seed:  [id number]. Total activity:  8.115 millicuries reference Date: 07/14/2020 The patient tolerated the procedure well and was released from the [place]. She was given instructions regarding seed removal. Using mammographic guidance, sterile technique, 1% lidocaine and an I-125 radioactive seed, the cylindrical shaped clip  and calcifications was localized using a superior to inferior approach. The follow-up mammogram  images confirm the seed in the expected location and were marked for Dr. Barry Dienes. Follow-up survey of the patient confirms presence of the radioactive seed. Order number of I-125 seed:  [id number]. Total activity:  9.150 millicuries reference Date: 07/03/2020 The patient tolerated the procedure well and was released from the [place]. She was given instructions regarding seed removal. IMPRESSION: Radioactive seed localizations right breast. No apparent complications. Electronically Signed   By: Lillia Mountain M.D.   On: 08/07/2020 15:50  MM RT RADIO SEED EA ADD LESION LOC MAMMO  Result Date: 08/07/2020 CLINICAL DATA:  Four areas of known cancer in the right breast. Ultrasound-guided core biopsy of the 10 o'clock region 2 cm from the nipple (ribbon shaped clip) positive for invasive ductal carcinoma, ultrasound-guided core biopsy of the right breast at 10 o'clock 7 cm from the nipple (coil shaped clip) positive for invasive ductal carcinoma, MR guided biopsy of a mass in the upper outer quadrant (cylindrical shaped clip) positive for invasive ductal carcinoma and stereotactic biopsy of calcifications in the upper-outer quadrant of the right breast (X shaped clip which is displaced 3 cm medially) positive for ductal carcinoma in-situ. Radioactive seed placements prior to surgery. EXAM: MAMMOGRAPHIC GUIDED RADIOACTIVE SEED LOCALIZATIONS OF THE RIGHT BREAST COMPARISON:  Previous exam(s). FINDINGS: Patient presents for radioactive seed localization prior to surgery. I met with the patient and we discussed the procedure of seed localization including benefits and alternatives. We discussed the high likelihood of a successful procedure. We discussed the risks of the procedure including infection, bleeding, tissue injury and further surgery. We discussed the low dose of radioactivity involved in the procedure. Informed, written consent was given. The usual time-out protocol was performed immediately prior to the  procedure. Using mammographic guidance, sterile technique, 1% lidocaine and an I-125 radioactive seed, the coil shaped clip was localized using a lateral to medial approach. The follow-up mammogram images confirm the seed in the expected location and were marked for Dr. Barry Dienes. Follow-up survey of the patient confirms presence of the radioactive seed. Order number of I-125 seed:  [id number]. Total activity:  6.553 millicuries reference Date: 07/03/2020 The patient tolerated the procedure well and was released from the [place]. She was given instructions regarding seed removal. Using mammographic guidance, sterile technique, 1% lidocaine and an I-125 radioactive seed, the ribbon shaped clip was localized using a lateral to medial approach. The follow-up mammogram images confirm the seed in the expected location and were marked for Dr. Barry Dienes. Follow-up survey of the patient confirms presence of the radioactive seed. Order number of I-125 seed:  [id number]. Total activity:  7.544 millicuries reference Date: 07/14/2020 The patient tolerated the procedure well and was released from the [place]. She was given instructions regarding seed removal. Using mammographic guidance, sterile technique, 1% lidocaine and an I-125 radioactive seed, the cylindrical shaped clip and calcifications was localized using a superior to inferior approach. The follow-up mammogram images confirm the seed in the expected location and were marked for Dr. Barry Dienes. Follow-up survey of the patient confirms presence of the radioactive seed. Order number of I-125 seed:  [id number]. Total activity:  1.975 millicuries reference Date: 07/03/2020 The patient tolerated the procedure well and was released from the [place]. She was given instructions regarding seed removal. IMPRESSION: Radioactive seed localizations right breast. No apparent complications. Electronically Signed   By: Lillia Mountain M.D.   On: 08/07/2020 15:50  Procedures Procedures   Medications Ordered in ED Medications  sodium chloride 0.9 % bolus 1,000 mL (has no administration in time range)    ED Course  I have reviewed the triage vital signs and the nursing notes.  Pertinent labs & imaging results that were available during my care of the patient were reviewed by me and considered in my medical decision making (see chart for details).    MDM Rules/Calculators/A&P                         Syncopal episode today after surgery.  Poor p.o. intake this morning.  Preceding nausea hot flashes with no chest pain or shortness of breath.  EKG is sinus rhythm without acute ST changes.  No prolonged QT, no Brugada  Patient given IV hydration.  Heart rate goes from 75-92 with standing.  May have some component of orthostasis though she is not hypotensive. Echocardiogram in April 2022 showed normal ejection fraction without any valvular abnormalities.  Hemoglobin is stable.  Does have leukocytosis which is nonspecific in the setting of recently having surgery.  Patient hydrated.  Will obtain imaging of her head and C-spine given her traumatic fall as well as CT angiogram to rule out pulmonary embolism.  Suspect her syncope likely vasovagal in setting of having surgery, anesthesia, poor p.o. intake throughout the day yesterday.  Low suspicion for arrhythmia.  Did have reassuring echocardiogram earlier this year.  Will hydrate, check second troponin, await CT imaging.  Care to be transferred at shift change. Anticipate she will be able to be discharged if work-up is reassuring. Dr. Karle Starch to assume care. Final Clinical Impression(s) / ED Diagnoses Final diagnoses:  Syncope and collapse    Rx / DC Orders ED Discharge Orders     None        Aaminah Forrester, Annie Main, MD 08/09/20 [id number]    Ezequiel Essex, MD 08/09/20 [phone]

## 2020-08-10 ENCOUNTER — Telehealth: Payer: Self-pay | Admitting: Neurology

## 2020-08-10 LAB — SURGICAL PATHOLOGY

## 2020-08-10 NOTE — Telephone Encounter (Signed)
Pt called and wanted to make Dr. Felecia Shelling aware that she fell and hit her head last night. She went to [facility] ED and had CT scans done (see ED notes).

## 2020-08-15 ENCOUNTER — Encounter: Payer: Self-pay | Admitting: *Deleted

## 2020-08-15 NOTE — Progress Notes (Signed)
Patient Care Team: Robyne Peers, MD as PCP - General (Family Medicine) Love, Alyson Locket, MD (Neurology) Jerrell Belfast, MD Mauro Kaufmann, RN as Oncology Nurse Navigator Rockwell Germany, RN as Oncology Nurse Navigator Donnie Mesa, MD as Consulting Physician (General Surgery) Nicholas Lose, MD as Consulting Physician (Hematology and Oncology) Kyung Rudd, MD as Consulting Physician (Radiation Oncology)  DIAGNOSIS:    ICD-10-CM   1. Malignant neoplasm of upper-outer quadrant of right breast in female, estrogen receptor negative ([place])  C50.411    Z17.1       SUMMARY OF ONCOLOGIC HISTORY: Oncology History  Malignant neoplasm of upper-outer quadrant of right breast in female, estrogen receptor negative ([place])  01/10/2020 Initial Diagnosis   Palpable right breast mass. Mammogram showed a 1.8cm mass at the 10:30 position 2cm from the nipple, a 1.5cm mass at the 10:30 position 7cm from the nipple, and indeterminate calcifications between the two masses in the right breast. 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    01/12/2020 Cancer Staging   Staging form: Breast, AJCC 8th Edition - Clinical stage from 01/12/2020: Stage IB (cT1c, cN0, cM0, G3, ER-, PR-, HER2-) - Signed by Nicholas Lose, MD on 02/03/2020    02/23/2020 -  Chemotherapy    Patient is on Treatment Plan: BREAST DOSE DENSE AC Q14D / CARBOPLATIN D1 + PACLITAXEL D1,8,15 Q21D       08/08/2020 Surgery   Right breast lumpectomy: scattered foci of DCIS with calcifications, intermediate to high-grade, largest measuring 0.3 cm, no residual invasive carcinoma, resection margins negative for DCIS, and all 3 axillary nodes negative for carcinoma     CHIEF COMPLIANT: Follow-up s/p lumpectomy  INTERVAL HISTORY: Jacqueline Adams is a 60 y.o. with above-mentioned history of BRCA2 mutation and right breast cancer currently on neoadjuvant chemotherapy  with weekly Taxol and Carboplatin every 3 weeks. She underwent a right breast lumpectomy on 08/08/20 from which pathology showed scattered foci of DCIS with calcifications, intermediate to high-grade, largest measuring 0.3 cm, no residual invasive carcinoma, resection margins negative for DCIS, and all 3 axillary nodes negative for carcinoma. She presents to the clinic today for follow-up and to review the pathology report.   ALLERGIES:  is allergic to procaine, shellfish allergy, and relafen [nabumetone].  MEDICATIONS:  Current Outpatient Medications  Medication Sig Dispense Refill   Calcium Carb-Cholecalciferol (CALCIUM 600+D3 PO) Take 1 tablet by mouth in the morning.     cholecalciferol (VITAMIN D) 25 MCG (1000 UNIT) tablet Take 2,000 Units by mouth in the morning.     Ferrous Sulfate Dried (SLOW RELEASE IRON) 45 MG TBCR Take 45 mg by mouth in the morning.     fexofenadine (ALLEGRA) 180 MG tablet Take 180 mg by mouth daily as needed for allergies.     fluticasone (FLONASE) 50 MCG/ACT nasal spray Place 2 sprays into both nostrils daily. (Patient taking differently: Place 2 sprays into both nostrils daily as needed for allergies.) 16 g 2   ibuprofen (ADVIL) 200 MG tablet Take 400 mg by mouth every Thursday. W/Avonex injection     ibuprofen (ADVIL) 200 MG tablet Take 400 mg by mouth every 6 (six) hours as needed for headache or mild pain.     interferon beta-1a (AVONEX PREFILLED) 30 MCG/0.5ML PSKT injection INJECT ONE SYRINGE INTRAMUSCULARLY ONCE WEEKLY. REFRIGERATE. PROTECT FROM LIGHT. ALLOW TO WARM TO ROOM TEMPERATURE PRIOR TO USE. (Patient taking differently: Inject 30 mcg into  the muscle every Thursday. INJECT ONE SYRINGE INTRAMUSCULARLY ONCE WEEKLY. REFRIGERATE. PROTECT FROM LIGHT. ALLOW TO WARM TO ROOM TEMPERATURE PRIOR TO USE.) 3 each 3   meclizine (ANTIVERT) 25 MG tablet Take 1 tablet (25 mg total) by mouth 3 (three) times daily as needed for dizziness. 30 tablet 0   metoprolol succinate  (TOPROL-XL) 25 MG 24 hr tablet Take 25 mg by mouth daily.     Multiple Vitamin (MULTIVITAMIN WITH MINERALS) TABS tablet Take 1 tablet by mouth every evening. Centrum     Omega-3 Fatty Acids (FISH OIL) 1000 MG CPDR Take 1,000 mg by mouth daily.     oxyCODONE (OXY IR/ROXICODONE) 5 MG immediate release tablet Take 1 tablet (5 mg total) by mouth every 6 (six) hours as needed for severe pain. 10 tablet 0   prochlorperazine (COMPAZINE) 10 MG tablet Take 1 tablet (10 mg total) by mouth every 6 (six) hours as needed (Nausea or vomiting). 30 tablet 1   simvastatin (ZOCOR) 20 MG tablet Take 20 mg by mouth every evening.     VENTOLIN HFA 108 (90 BASE) MCG/ACT inhaler Inhale 1-2 puffs into the lungs every 6 (six) hours as needed for shortness of breath or wheezing.     No current facility-administered medications for this visit.    PHYSICAL EXAMINATION: ECOG PERFORMANCE STATUS: 1 - Symptomatic but completely ambulatory  Vitals:   08/16/20 0940  BP: 122/65  Pulse: (!) 106  Resp: 18  Temp: (!) 97.3 F (36.3 C)  SpO2: 100%   Filed Weights   08/16/20 0940  Weight: 212 lb 3.2 oz (96.3 kg)      LABORATORY DATA:  I have reviewed the data as listed CMP Latest Ref Rng & Units 08/09/2020 08/02/2020 07/05/2020  Glucose 70 - 99 mg/dL 164(H) 114(H) 98  BUN 6 - 20 mg/dL _0 Creatinine 0.44 - 1.00 mg/dL 0.74 0.59 0.62  Sodium 135 - 145 mmol/L 138 139 142  Potassium 3.5 - 5.1 mmol/L 3.8 3.6 3.1(L)  Chloride 98 - 111 mmol/L 105 107 105  CO2 22 - 32 mmol/L _1 Calcium 8.9 - 10.3 mg/dL 8.9 8.9 9.0  Total Protein 6.5 - 8.1 g/dL - - 6.5  Total Bilirubin 0.3 - 1.2 mg/dL - - 0.3  Alkaline Phos 38 - 126 U/L - - 92  AST 15 - 41 U/L - - 25  ALT 0 - 44 U/L - - 22    Lab Results  Component Value Date   WBC 18.8 (H) 08/09/2020   HGB 9.8 (L) 08/09/2020   HCT 30.4 (L) 08/09/2020   MCV 86.6 08/09/2020   PLT 263 08/09/2020   NEUTROABS 16.1 (H) 08/09/2020    ASSESSMENT & PLAN:  Malignant neoplasm  of upper-outer quadrant of right breast in female, estrogen receptor negative ([place]) 01/10/2020:Palpable right breast mass. Mammogram showed a 1.8cm mass at the 10:30 position 2cm from the nipple, a 1.5cm mass at the 10:30 position 7cm from the nipple, and indeterminate calcifications between the two masses in the right breast. 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  BRCA2 Mutation   Treatment plan: 1. Neoadjuvant chemotherapy with Adriamycin and Cytoxan every 2 weeks 4 followed by Taxol weekly 12 with carboplatin and  every 3 weeks x4 completed 07/05/2020 2. 08/08/20 breast conserving surgery with sentinel lymph node study : scattered foci of DCIS, margins Neg, 0/3 LN Neg 3. Followed by adjuvant  radiation therapy if she undergoes breast conserving surgery 4. Adjuvant olaparib URCC nausea study   Breast MRI 01/14/2020: 2 cm spiculated mass UOQ right breast, 2.1 cm spiculated mass UOQ right breast, biopsy-proven malignancies.  7 mm suspicious enhancing mass: Biopsy 02/02/2020: High-grade DCIS ----------------------------------------------------------------------------------------------------------------------------------------------- Pathology counseling: I discussed the final pathology report of the patient provided  a copy of this report. I discussed the margins as well as lymph node surgeries. We also discussed the final staging along with previously performed ER/PR and HER-2/neu testing.  She was ecstatic hearing the excellent pathology results. I sent a message for the port to be removed. Return to clinic after radiation   No orders of the defined types were placed in this encounter.  The patient has a good understanding of the overall plan. she agrees with it. she will call with any problems that may develop before the next visit here.  Total time spent: 30 mins including face to face time and time spent for  planning, charting and coordination of care  Rulon Eisenmenger, MD, MPH 08/16/2020  I, Thana Ates, am acting as scribe for Dr. Nicholas Lose.  I have reviewed the above documentation for accuracy and completeness, and I agree with the above.

## 2020-08-15 NOTE — Assessment & Plan Note (Signed)
01/10/2020:Palpable right breast mass. Mammogram showed a 1.8cm mass at the 10:30 position 2cm from the nipple, a 1.5cm mass at the 10:30 position 7cm from the nipple, and indeterminate calcifications between the two masses in the right breast. 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 BRCA2Mutation  Treatment plan: 1. Neoadjuvant chemotherapy with Adriamycin and Cytoxan every2weeks4 followed by Taxol weekly 12 with carboplatinand every 3 weeks x4 2. Followed by breast conserving surgery with sentinel lymph node study vsbilateral mastectomies 3. Followed by adjuvant radiation therapyif she undergoes breast conserving surgery 4.Adjuvant olaparib URCC nausea study  Breast MRI12/04/2019: 2 cm spiculated mass UOQ right breast, 2.1 cm spiculated mass UOQ right breast, biopsy-proven malignancies. 7 mm suspicious enhancing mass: Biopsy 02/02/2020: High-grade DCIS ----------------------------------------------------------------------------------------------------------------------------------------------- Current treatment:Completed 4 cycles ofneoadjuvant Adriamycin and Cytoxan, today cycle10Taxol (with carboplatinevery 3 weeks) Echocardiogram 01/14/2020: EF 55 to 60%  Chemo Toxicities: 1.Fatigue 2.shortness of breath to minimal exertion: related to fatigue. 3.Tachycardia: Cardiac evaluation is being planned 5.Nausea:Much improved with addition of Zofran 6.Nailbed infection: After 1 week of Keflex she is looking better. I will add 1 more week of Keflex antibiotics.  RTCweekly for Taxol Breast MRI  07/04/2020: Treatment response with no residual mass or enhancement  Return to clinic after surgery to discuss final pathology report.  Her port can be removed given her excellent radiological response.

## 2020-08-16 ENCOUNTER — Inpatient Hospital Stay: Payer: 59 | Attending: Hematology and Oncology | Admitting: Hematology and Oncology

## 2020-08-16 ENCOUNTER — Other Ambulatory Visit: Payer: Self-pay

## 2020-08-16 DIAGNOSIS — Z5111 Encounter for antineoplastic chemotherapy: Secondary | ICD-10-CM | POA: Insufficient documentation

## 2020-08-16 DIAGNOSIS — Z171 Estrogen receptor negative status [ER-]: Secondary | ICD-10-CM | POA: Diagnosis not present

## 2020-08-16 DIAGNOSIS — Z79899 Other long term (current) drug therapy: Secondary | ICD-10-CM | POA: Diagnosis not present

## 2020-08-16 DIAGNOSIS — C50411 Malignant neoplasm of upper-outer quadrant of right female breast: Secondary | ICD-10-CM | POA: Diagnosis present

## 2020-08-17 ENCOUNTER — Other Ambulatory Visit: Payer: Self-pay | Admitting: General Surgery

## 2020-08-18 ENCOUNTER — Other Ambulatory Visit: Payer: Self-pay | Admitting: Hematology and Oncology

## 2020-08-21 ENCOUNTER — Encounter: Payer: Self-pay | Admitting: Hematology and Oncology

## 2020-08-28 ENCOUNTER — Other Ambulatory Visit: Payer: Self-pay | Admitting: General Surgery

## 2020-08-31 ENCOUNTER — Ambulatory Visit
Admission: RE | Admit: 2020-08-31 | Discharge: 2020-08-31 | Disposition: A | Discharge status: Home / Self Care | Payer: 59 | Source: Ambulatory Visit | Attending: Radiation Oncology | Admitting: Radiation Oncology

## 2020-08-31 ENCOUNTER — Other Ambulatory Visit: Payer: Self-pay

## 2020-08-31 ENCOUNTER — Ambulatory Visit: Payer: 59 | Admitting: Radiation Oncology

## 2020-08-31 VITALS — BP 115/63 | HR 79 | Temp 97.9°F | Resp 20 | Ht 64.2 in | Wt 212.4 lb

## 2020-08-31 VITALS — BP 115/63 | HR 79 | Temp 97.9°F | Resp 20 | Ht 64.25 in | Wt 212.4 lb

## 2020-08-31 DIAGNOSIS — Z1501 Genetic susceptibility to malignant neoplasm of breast: Secondary | ICD-10-CM | POA: Insufficient documentation

## 2020-08-31 DIAGNOSIS — I517 Cardiomegaly: Secondary | ICD-10-CM | POA: Insufficient documentation

## 2020-08-31 DIAGNOSIS — Z171 Estrogen receptor negative status [ER-]: Secondary | ICD-10-CM | POA: Diagnosis not present

## 2020-08-31 DIAGNOSIS — C50411 Malignant neoplasm of upper-outer quadrant of right female breast: Secondary | ICD-10-CM | POA: Insufficient documentation

## 2020-08-31 DIAGNOSIS — Z800 Family history of malignant neoplasm of digestive organs: Secondary | ICD-10-CM | POA: Insufficient documentation

## 2020-08-31 DIAGNOSIS — Z8041 Family history of malignant neoplasm of ovary: Secondary | ICD-10-CM | POA: Diagnosis not present

## 2020-08-31 DIAGNOSIS — Z79899 Other long term (current) drug therapy: Secondary | ICD-10-CM | POA: Insufficient documentation

## 2020-08-31 DIAGNOSIS — Z801 Family history of malignant neoplasm of trachea, bronchus and lung: Secondary | ICD-10-CM | POA: Insufficient documentation

## 2020-08-31 DIAGNOSIS — E7800 Pure hypercholesterolemia, unspecified: Secondary | ICD-10-CM | POA: Insufficient documentation

## 2020-08-31 DIAGNOSIS — R9082 White matter disease, unspecified: Secondary | ICD-10-CM | POA: Insufficient documentation

## 2020-08-31 DIAGNOSIS — Z803 Family history of malignant neoplasm of breast: Secondary | ICD-10-CM | POA: Diagnosis not present

## 2020-08-31 DIAGNOSIS — G35 Multiple sclerosis: Secondary | ICD-10-CM | POA: Insufficient documentation

## 2020-08-31 DIAGNOSIS — M8580 Other specified disorders of bone density and structure, unspecified site: Secondary | ICD-10-CM | POA: Diagnosis not present

## 2020-08-31 NOTE — Progress Notes (Signed)
Histology per Pathology Report: grade Intermediate to high,scattered foci of DCIS with calcifications  Receptor Status: ER(negative), PR (negative), Her2-neu (negative), Ki-(70-80%)  Surgeon and surgical plan, if any:  Dr. Barry Dienes -Right Breast Lumpectomy with radioactive seed x3 and SLN biopsy  Medical oncologist, treatment if any:   Dr. [name] 08/15/2020 Treatment plan: 1. Neoadjuvant chemotherapy with Adriamycin and Cytoxan every 2 weeks 4 followed by Taxol weekly 12 with carboplatin and  every 3 weeks x4 2. Followed by breast conserving surgery with sentinel lymph node study vs bilateral mastectomies 3. Followed by adjuvant radiation therapy if she undergoes breast conserving surgery 4. Adjuvant olaparib URCC nausea study Current treatment: Completed 4 cycles of neoadjuvant Adriamycin and Cytoxan, today cycle 10 Taxol (with carboplatin  every 3 weeks)   Family History of Breast/Ovarian/Prostate Cancer: Mom had ovarian and breast cancer.  Sister had ovarian cancer.  Lymphedema issues, if any: None  Pain issues, if any: Has some tenderness in the breast and some slight swelling.     SAFETY ISSUES: Prior radiation? No Pacemaker/ICD? No Possible current pregnancy? Hysterectomy Is the patient on methotrexate? No  Current Complaints / other details:

## 2020-08-31 NOTE — Progress Notes (Signed)
Radiation Oncology         ([phone]) [phone] ________________________________  Name: Jacqueline Adams        MRN: [id number]  Date of Service: 08/31/2020 DOB: Jun 04, 1960  YS:[id number], Wynonia Musty, MD  Nicholas Lose, MD     REFERRING PHYSICIAN: Nicholas Lose, MD   DIAGNOSIS: The encounter diagnosis was Malignant neoplasm of upper-outer quadrant of right breast in female, estrogen receptor negative ([place]).   HISTORY OF PRESENT ILLNESS: Jacqueline Adams is a 60 y.o. female originally seen in the multidisciplinary breast clinic for a new diagnosis of right breast cancer, patient had a palpable mass in the right breast, and has a strong family history of breast and ovarian cancer in herself is BRCA2 positive.  By diagnostic imaging, a mass was seen that she was able to palpate in the 1030 o'clock position of the upper outer quadrant measuring 1.6 cm, a second mass in the upper outer quadrant also at 1030 o'clock was noted and measured 1.8 cm.  Her axilla was negative for adenopathy, and clip films measured the 2 masses 7 cm apart.  There were also indeterminate calcifications between the 2 masses.  These have not been sampled.  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    Since her last visit she had an MRI that showed the known disease as well as a new finding of a 7 mm enhancing mass posterior and lateral to the known diagnosis.  Additional imaging of the left breast and all of her nodes were negative.  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  She began neoadjuvant chemotherapy from 02/23/2020 through 07/05/2020.  She had repeat MRI of the breasts, and treatment response  without residual enhancement was noted.  No abnormal appearing adenopathy was present.  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  She has had difficulty with a postoperative hematoma and has had this aspirated in the office.  She does back to see Dr. Barry Dienes on 09/19/2020 and is scheduled to have her right-sided chest port removed on 09/21/2020.  PREVIOUS RADIATION THERAPY: No   PAST MEDICAL HISTORY:  Past Medical History:  Diagnosis Date   Breast cancer ([place]) 01/2020   right breast IDC   Bronchitis    Cervical spondylarthritis    Headache(784.0)    High cholesterol    Leukocytosis 2009   Multiple sclerosis ([place]) 2009   followed by Dr. Erling Cruz   Neuromuscular disorder [facility])    Pneumonia    Rhinitis    Seasonal allergies    Sinusitis        PAST SURGICAL HISTORY: Past Surgical History:  Procedure Laterality Date   ABDOMINAL HYSTERECTOMY     BREAST LUMPECTOMY WITH RADIOACTIVE SEED AND SENTINEL LYMPH NODE BIOPSY Right 08/08/2020   Procedure: RIGHT BREAST LUMPECTOMY WITH RADIOACTIVE SEED X 3 AND SENTINEL LYMPH NODE BIOPSY;  Surgeon:  Stark Klein, MD;  Location: [facility];  Service: General;  Laterality: Right;   CESAREAN SECTION     x 3   CORRECTION HAMMER TOE Bilateral    PARTIAL HYSTERECTOMY  2006   for uterine fibroid.    PORTACATH PLACEMENT Right 02/22/2020   Procedure: RIGHT SUBCLAVIAN VEIN PORT PLACEMENT;  Surgeon: Donnie Mesa, MD;  Location: [facility];  Service: General;  Laterality: Right;     FAMILY HISTORY:  Family History  Problem Relation Age of Onset   Cancer Mother 80       ovarian cancer; now with  suspected breast cancer   Diabetes Mother        also HTN   Breast cancer Mother    Clotting disorder Father    Prostate cancer Father    Breast cancer Maternal Aunt    Cancer Maternal Grandmother        unknown female reproductive organ cancer   Breast cancer Maternal Aunt    Breast cancer Maternal Aunt    Colon cancer Sister 12       anal cancer (2014)   Lung cancer Sister 52   Stroke Sister 80   Ovarian cancer Sister 67   BRCA 1/2 Sister      SOCIAL HISTORY:  reports that she has never smoked. She has never used smokeless tobacco. She reports that she does not drink alcohol and does not use drugs.  The patient is married and resides in [place].  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 She's been able to work remotely since the covid pandemic, and has two teenage children.    ALLERGIES: Procaine, Shellfish allergy, and Relafen [nabumetone]   MEDICATIONS:  Current Outpatient Medications  Medication Sig Dispense Refill   Calcium Carb-Cholecalciferol (CALCIUM 600+D3 PO) Take 1 tablet by mouth in the morning.     cholecalciferol (VITAMIN D) 25 MCG (1000 UNIT) tablet Take 2,000 Units by mouth in the morning.     Ferrous Sulfate Dried (SLOW RELEASE IRON) 45 MG TBCR Take 45 mg by mouth in the morning.     fexofenadine (ALLEGRA) 180 MG tablet Take 180 mg by mouth daily as needed for allergies.     fluticasone (FLONASE) 50 MCG/ACT nasal spray Place 2 sprays into both nostrils daily. (Patient taking differently: Place 2 sprays into both nostrils daily as needed for allergies.) 16 g 2   ibuprofen (ADVIL) 200 MG tablet Take 400 mg by mouth every Thursday. W/Avonex injection     ibuprofen (ADVIL) 200 MG tablet Take 400 mg by mouth every 6 (six) hours as needed for headache or mild pain.     interferon beta-1a (AVONEX PREFILLED) 30 MCG/0.5ML PSKT injection INJECT ONE SYRINGE INTRAMUSCULARLY ONCE WEEKLY. REFRIGERATE. PROTECT FROM LIGHT. ALLOW TO WARM TO ROOM TEMPERATURE PRIOR TO USE.  (Patient taking differently: Inject 30 mcg into the muscle every Thursday. INJECT ONE SYRINGE INTRAMUSCULARLY ONCE WEEKLY. REFRIGERATE. PROTECT FROM LIGHT. ALLOW TO WARM TO ROOM TEMPERATURE PRIOR TO USE.) 3 each 3   meclizine (ANTIVERT) 25 MG tablet Take 1 tablet (25 mg total) by mouth 3 (three) times daily as needed for dizziness. 30 tablet 0   metoprolol succinate (TOPROL-XL) 25 MG 24 hr tablet Take 25 mg by mouth daily.     Multiple Vitamin (MULTIVITAMIN WITH MINERALS) TABS tablet Take 1 tablet by mouth every evening. Centrum     Omega-3 Fatty Acids (FISH OIL) 1000 MG CPDR Take 1,000 mg by mouth daily.     potassium chloride  SA (KLOR-CON M20) 20 MEQ tablet Take 1 tablet (20 mEq total) by mouth in the morning. 30 tablet 1   prochlorperazine (COMPAZINE) 10 MG tablet Take 1 tablet (10 mg total) by mouth every 6 (six) hours as needed (Nausea or vomiting). 30 tablet 1   simvastatin (ZOCOR) 20 MG tablet Take 20 mg by mouth every evening.     VENTOLIN HFA 108 (90 BASE) MCG/ACT inhaler Inhale 1-2 puffs into the lungs every 6 (six) hours as needed for shortness of breath or wheezing.     No current facility-administered medications for this encounter.     REVIEW OF SYSTEMS: On review of systems, the patient reports that she is doing very well overall, she has had some discomfort related to the swelling from her hematoma in the breast.  Her skin is quite bruised from this, she has been wearing her compression binder since surgery.  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 No other complaints are verbalized otherwise.    PHYSICAL EXAM:  Wt Readings from Last 3 Encounters:  08/31/20 212 lb 6.4 oz (96.3 kg)  08/31/20 212 lb 6.4 oz (96.3 kg)  08/16/20 212 lb 3.2 oz (96.3 kg)   Temp Readings from Last 3 Encounters:  08/31/20 97.9 F  (36.6 C)  08/31/20 97.9 F (36.6 C)  08/16/20 (!) 97.3 F (36.3 C) (Temporal)   BP Readings from Last 3 Encounters:  08/31/20 115/63  08/31/20 115/63  08/16/20 122/65   Pulse Readings from Last 3 Encounters:  08/31/20 79  08/31/20 79  08/16/20 (!) 106    In general this is a well appearing African American female in no acute distress. She's alert and oriented x4 and appropriate throughout the examination. Cardiopulmonary assessment is negative for acute distress and she exhibits normal effort.  Her right breast has a well-healing incision site with Steri-Strips intact, there is still a small area along the lateral aspect of her incision line with slight separation possibly up to 3 mm.  There is a small amount of fluctuance palpable adjacent to the site where her aspiration was performed.  The medial breast is darkened from bruising and involves the majority of the medial right breast.    ECOG = 1  0 - Asymptomatic (Fully active, able to carry on all predisease activities without restriction)  1 - Symptomatic but completely ambulatory (Restricted in physically strenuous activity but ambulatory and able to carry out work of a light or sedentary nature. For example, light housework, office work)  2 - Symptomatic, <50% in bed during the day (Ambulatory and capable of all self care but unable to carry out any work activities. Up and about more than 50% of waking hours)  3 - Symptomatic, >50% in bed, but not bedbound (Capable of only limited self-care, confined to bed or chair 50% or more of waking hours)  4 - Bedbound (Completely disabled. Cannot carry on any self-care. Totally confined to bed or chair)  5 - Death   Eustace Pen MM, Creech RH, Tormey DC, et al. [phone]). "Toxicity and response criteria of the [facility]". [place] Oncol. 5 (6): 649-55    LABORATORY DATA:  Lab Results  Component Value Date   WBC 18.8 (H) 08/09/2020   HGB 9.8 (L) 08/09/2020    HCT 30.4 (L) 08/09/2020   MCV 86.6  08/09/2020   PLT 263 08/09/2020   Lab Results  Component Value Date   NA 138 08/09/2020   K 3.8 08/09/2020   CL 105 08/09/2020   CO2 25 08/09/2020   Lab Results  Component Value Date   ALT 22 07/05/2020   AST 25 07/05/2020   ALKPHOS 92 07/05/2020   BILITOT 0.3 07/05/2020      RADIOGRAPHY: DG Chest 2 View  Result Date: 08/09/2020 CLINICAL DATA:  Fall, syncope EXAM: CHEST - 2 VIEW COMPARISON:  03/01/2020 FINDINGS: Lungs are clear. No pneumothorax or pleural effusion. Cardiac size is mildly enlarged, unchanged. Right subclavian chest port is seen with its tip within the superior vena cava. Multiple surgical clips are seen within the right breast. Pulmonary vascularity is normal. No acute bone abnormality. IMPRESSION: No active cardiopulmonary disease.  Stable cardiomegaly. Electronically Signed   By: Fidela Salisbury MD   On: 08/09/2020 04:58   CT Head Wo Contrast  Result Date: 08/09/2020 CLINICAL DATA:  60 year old female status post syncope and fall. Trauma. Pain. Multiple sclerosis. EXAM: CT HEAD WITHOUT CONTRAST TECHNIQUE: Contiguous axial images were obtained from the base of the skull through the vertex without intravenous contrast. COMPARISON:  Brain MRI 04/29/2018 and earlier. Report of [facility] head CT 01/01/2012 (no images available). FINDINGS: Brain: Cavum septum pellucidum, normal variant. No midline shift, ventriculomegaly, mass effect, evidence of mass lesion, intracranial hemorrhage or evidence of cortically based acute infarction. Patchy left corona radiata and periatrial white matter hypodensity appears stable since the 2020 MRI. Elsewhere gray-white matter differentiation remains normal. No cortical encephalomalacia identified. Vascular: Mild Calcified atherosclerosis at the skull base. No suspicious intracranial vascular hyperdensity. Skull: Intact.  Mild hyperostosis, normal variant.  Sinuses/Orbits: Visualized paranasal sinuses and mastoids are clear. Other: No orbit or scalp soft tissue injury identified. IMPRESSION: 1. No acute intracranial abnormality or acute traumatic injury identified. 2. Left cerebral white matter disease appears stable since a 2020 MRI. Electronically Signed   By: Genevie Ann M.D.   On: 08/09/2020 07:14   CT Angio Chest PE W and/or Wo Contrast  Result Date: 08/09/2020 CLINICAL DATA:  60 year old female status post breast lumpectomy with syncope and chest pain. EXAM: CT ANGIOGRAPHY CHEST WITH CONTRAST TECHNIQUE: Multidetector CT imaging of the chest was performed using the standard protocol during bolus administration of intravenous contrast. Multiplanar CT image reconstructions and MIPs were obtained to evaluate the vascular anatomy. CONTRAST:  Fifty mL Omnipaque 350, intravenous COMPARISON:  05/09/2014 FINDINGS: Cardiovascular: Satisfactory opacification of the pulmonary arteries to the segmental level. No evidence of pulmonary embolism. Mild global cardiomegaly. No pericardial effusion. Mediastinum/Nodes: Right chest wall subclavian vein approach Port-A-Cath in place with the tip at the cavoatrial junction. No enlarged mediastinal, hilar, or axillary lymph nodes. Thyroid gland, trachea, and esophagus demonstrate no significant findings. Lungs/Pleura: Mild bibasilar subsegmental atelectasis. No suspicious pulmonary nodules. No pleural effusion or pneumothorax. Upper Abdomen: Diffusely decreased attenuation of the hepatic parenchyma. The remaining visualized upper abdomen is within normal limits. Musculoskeletal: Postsurgical changes in the right breast and axilla with diffuse subcutaneous emphysema. There is a poorly defined region of increased density in the central/lateral aspect of the right breast compatible with hematoma measuring to approximately 5.6 by 10.1 cm. No evidence of active extravasation. No acute or significant osseous findings. Review of the MIP images  confirms the above findings. IMPRESSION: Vascular: 1. No pulmonary embolism. 2. Mild global cardiomegaly Non-Vascular: 1. Mild bibasilar subsegmental atelectasis. 2. Postsurgical changes after right  breast and axillary surgery. Findings compatible with hematoma in the surgical bed, likely within normal limits on postoperative day #1. No active extravasation visualized. Ruthann Cancer, MD [facility] Electronically Signed   By: Ruthann Cancer MD   On: 08/09/2020 07:16   CT Cervical Spine Wo Contrast  Result Date: 08/09/2020 CLINICAL DATA:  60 year old female status post syncope and fall. Trauma. Pain. Multiple sclerosis. EXAM: CT CERVICAL SPINE WITHOUT CONTRAST TECHNIQUE: Multidetector CT imaging of the cervical spine was performed without intravenous contrast. Multiplanar CT image reconstructions were also generated. COMPARISON:  Head CT today. Cervical spine MRI 05/22/2016 and earlier. FINDINGS: Alignment: Straightening of cervical lordosis in 2018 with mild reversal of lordosis today. Cervicothoracic junction alignment is within normal limits. Bilateral posterior element alignment is within normal limits. Skull base and vertebrae: Visualized skull base is intact. No atlanto-occipital dissociation. C1 and C2 appear intact and aligned. No acute osseous abnormality identified. Small C7 cervical ribs, normal variant. Soft tissues and spinal canal: No prevertebral fluid or swelling. No visible canal hematoma. Retropharyngeal course of both carotids. Negative visible noncontrast neck soft tissues. Disc levels: Mild for age cervical spine degeneration appears not significantly changed since the 2018 MRI. Upper chest: Negative. IMPRESSION: 1. No acute traumatic injury identified in the cervical spine. 2. Mild for age cervical spine degeneration appears not significantly changed since a 2018 MRI. Electronically Signed   By: Genevie Ann M.D.   On: 08/09/2020 07:17    NM Sentinel Node Inj-No Rpt (Breast)  Result Date: 08/08/2020 Sulfur Colloid was injected by the Nuclear Medicine Technologist for sentinel lymph node localization.   MM Breast Surgical Specimen  Result Date: 08/08/2020 CLINICAL DATA:  Patient status post lumpectomy right breast. EXAM: SPECIMEN RADIOGRAPH OF THE RIGHT BREAST COMPARISON:  Previous exam(s). FINDINGS: Status post excision of the right breast. The radioactive seeds and biopsy marker clips are present, completely intact, and were marked for pathology. IMPRESSION: Specimen radiograph of the right breast. Electronically Signed   By: Lovey Newcomer M.D.   On: 08/08/2020 13:02  MM RT RADIOACTIVE SEED LOC MAMMO GUIDE  Result Date: 08/07/2020 CLINICAL DATA:  Four areas of known cancer in the right breast. Ultrasound-guided core biopsy of the 10 o'clock region 2 cm from the nipple (ribbon shaped clip) positive for invasive ductal carcinoma, ultrasound-guided core biopsy of the right breast at 10 o'clock 7 cm from the nipple (coil shaped clip) positive for invasive ductal carcinoma, MR guided biopsy of a mass in the upper outer quadrant (cylindrical shaped clip) positive for invasive ductal carcinoma and stereotactic biopsy of calcifications in the upper-outer quadrant of the right breast (X shaped clip which is displaced 3 cm medially) positive for ductal carcinoma in-situ. Radioactive seed placements prior to surgery. EXAM: MAMMOGRAPHIC GUIDED RADIOACTIVE SEED LOCALIZATIONS OF THE RIGHT BREAST COMPARISON:  Previous exam(s). FINDINGS: Patient presents for radioactive seed localization prior to surgery. I met with the patient and we discussed the procedure of seed localization including benefits and alternatives. We discussed the high likelihood of a successful procedure. We discussed the risks of the procedure including infection, bleeding, tissue injury and further surgery. We discussed the low dose of radioactivity involved in the procedure.  Informed, written consent was given. The usual time-out protocol was performed immediately prior to the procedure. Using mammographic guidance, sterile technique, 1% lidocaine and an I-125 radioactive seed, the coil shaped clip was localized using a lateral to medial approach. The follow-up mammogram images confirm the seed in  the expected location and were marked for Dr. Barry Dienes. Follow-up survey of the patient confirms presence of the radioactive seed. Order number of I-125 seed:  [id number]. Total activity:  8.182 millicuries reference Date: 07/03/2020 The patient tolerated the procedure well and was released from the [facility]. She was given instructions regarding seed removal. Using mammographic guidance, sterile technique, 1% lidocaine and an I-125 radioactive seed, the ribbon shaped clip was localized using a lateral to medial approach. The follow-up mammogram images confirm the seed in the expected location and were marked for Dr. Barry Dienes. Follow-up survey of the patient confirms presence of the radioactive seed. Order number of I-125 seed:  [id number]. Total activity:  8.938 millicuries reference Date: 07/14/2020 The patient tolerated the procedure well and was released from the [facility]. She was given instructions regarding seed removal. Using mammographic guidance, sterile technique, 1% lidocaine and an I-125 radioactive seed, the cylindrical shaped clip and calcifications was localized using a superior to inferior approach. The follow-up mammogram images confirm the seed in the expected location and were marked for Dr. Barry Dienes. Follow-up survey of the patient confirms presence of the radioactive seed. Order number of I-125 seed:  [id number]. Total activity:  8.527 millicuries reference Date: 07/03/2020 The patient tolerated the procedure well and was released from the [facility]. She was given instructions regarding seed removal. IMPRESSION: Radioactive seed localizations right breast. No apparent  complications. Electronically Signed   By: Lillia Mountain M.D.   On: 08/07/2020 15:50  MM RT RADIO SEED EA ADD LESION LOC MAMMO  Result Date: 08/07/2020 CLINICAL DATA:  Four areas of known cancer in the right breast. Ultrasound-guided core biopsy of the 10 o'clock region 2 cm from the nipple (ribbon shaped clip) positive for invasive ductal carcinoma, ultrasound-guided core biopsy of the right breast at 10 o'clock 7 cm from the nipple (coil shaped clip) positive for invasive ductal carcinoma, MR guided biopsy of a mass in the upper outer quadrant (cylindrical shaped clip) positive for invasive ductal carcinoma and stereotactic biopsy of calcifications in the upper-outer quadrant of the right breast (X shaped clip which is displaced 3 cm medially) positive for ductal carcinoma in-situ. Radioactive seed placements prior to surgery. EXAM: MAMMOGRAPHIC GUIDED RADIOACTIVE SEED LOCALIZATIONS OF THE RIGHT BREAST COMPARISON:  Previous exam(s). FINDINGS: Patient presents for radioactive seed localization prior to surgery. I met with the patient and we discussed the procedure of seed localization including benefits and alternatives. We discussed the high likelihood of a successful procedure. We discussed the risks of the procedure including infection, bleeding, tissue injury and further surgery. We discussed the low dose of radioactivity involved in the procedure. Informed, written consent was given. The usual time-out protocol was performed immediately prior to the procedure. Using mammographic guidance, sterile technique, 1% lidocaine and an I-125 radioactive seed, the coil shaped clip was localized using a lateral to medial approach. The follow-up mammogram images confirm the seed in the expected location and were marked for Dr. Barry Dienes. Follow-up survey of the patient confirms presence of the radioactive seed. Order number of I-125 seed:  [id number]. Total activity:  1.443 millicuries reference Date: 07/03/2020 The patient  tolerated the procedure well and was released from the [place]. She was given instructions regarding seed removal. Using mammographic guidance, sterile technique, 1% lidocaine and an I-125 radioactive seed, the ribbon shaped clip was localized using a lateral to medial approach. The follow-up mammogram images confirm the seed in the expected location and were marked for Dr. Barry Dienes. Follow-up survey  of the patient confirms presence of the radioactive seed. Order number of I-125 seed:  [id number]. Total activity:  1.740 millicuries reference Date: 07/14/2020 The patient tolerated the procedure well and was released from the [facility]. She was given instructions regarding seed removal. Using mammographic guidance, sterile technique, 1% lidocaine and an I-125 radioactive seed, the cylindrical shaped clip and calcifications was localized using a superior to inferior approach. The follow-up mammogram images confirm the seed in the expected location and were marked for Dr. Barry Dienes. Follow-up survey of the patient confirms presence of the radioactive seed. Order number of I-125 seed:  [id number]. Total activity:  3.149 millicuries reference Date: 07/03/2020 The patient tolerated the procedure well and was released from the [facility]. She was given instructions regarding seed removal. IMPRESSION: Radioactive seed localizations right breast. No apparent complications. Electronically Signed   By: Lillia Mountain M.D.   On: 08/07/2020 15:50  MM RT RADIO SEED EA ADD LESION LOC MAMMO  Result Date: 08/07/2020 CLINICAL DATA:  Four areas of known cancer in the right breast. Ultrasound-guided core biopsy of the 10 o'clock region 2 cm from the nipple (ribbon shaped clip) positive for invasive ductal carcinoma, ultrasound-guided core biopsy of the right breast at 10 o'clock 7 cm from the nipple (coil shaped clip) positive for invasive ductal carcinoma, MR guided biopsy of a mass in the upper outer quadrant (cylindrical  shaped clip) positive for invasive ductal carcinoma and stereotactic biopsy of calcifications in the upper-outer quadrant of the right breast (X shaped clip which is displaced 3 cm medially) positive for ductal carcinoma in-situ. Radioactive seed placements prior to surgery. EXAM: MAMMOGRAPHIC GUIDED RADIOACTIVE SEED LOCALIZATIONS OF THE RIGHT BREAST COMPARISON:  Previous exam(s). FINDINGS: Patient presents for radioactive seed localization prior to surgery. I met with the patient and we discussed the procedure of seed localization including benefits and alternatives. We discussed the high likelihood of a successful procedure. We discussed the risks of the procedure including infection, bleeding, tissue injury and further surgery. We discussed the low dose of radioactivity involved in the procedure. Informed, written consent was given. The usual time-out protocol was performed immediately prior to the procedure. Using mammographic guidance, sterile technique, 1% lidocaine and an I-125 radioactive seed, the coil shaped clip was localized using a lateral to medial approach. The follow-up mammogram images confirm the seed in the expected location and were marked for Dr. Barry Dienes. Follow-up survey of the patient confirms presence of the radioactive seed. Order number of I-125 seed:  [id number]. Total activity:  8.502 millicuries reference Date: 07/03/2020 The patient tolerated the procedure well and was released from the [facility]. She was given instructions regarding seed removal. Using mammographic guidance, sterile technique, 1% lidocaine and an I-125 radioactive seed, the ribbon shaped clip was localized using a lateral to medial approach. The follow-up mammogram images confirm the seed in the expected location and were marked for Dr. Barry Dienes. Follow-up survey of the patient confirms presence of the radioactive seed. Order number of I-125 seed:  [id number]. Total activity:  7.672 millicuries reference Date:  07/14/2020 The patient tolerated the procedure well and was released from the [facility]. She was given instructions regarding seed removal. Using mammographic guidance, sterile technique, 1% lidocaine and an I-125 radioactive seed, the cylindrical shaped clip and calcifications was localized using a superior to inferior approach. The follow-up mammogram images confirm the seed in the expected location and were marked for Dr. Barry Dienes. Follow-up survey of the patient confirms presence of the radioactive seed.  Order number of I-125 seed:  [id number]. Total activity:  5.248 millicuries reference Date: 07/03/2020 The patient tolerated the procedure well and was released from the [facility]. She was given instructions regarding seed removal. IMPRESSION: Radioactive seed localizations right breast. No apparent complications. Electronically Signed   By: Lillia Mountain M.D.   On: 08/07/2020 15:50       IMPRESSION/PLAN: 1. 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 Dr. [name] discusses the patient's course and her final pathology findings and reviews the nature of triple negative breast disease.  She is doing well but is not quite ready to proceed with adjuvant therapy due to her persistent healing.  Dr. [name] discusses the rationale for external radiotherapy to the breast  to reduce risks of local recurrence followed by antiestrogen therapy. We discussed the risks, benefits, short, and long term effects of radiotherapy, as well as the curative intent, and the patient is interested in proceeding. Dr. [name] discusses the delivery and logistics of radiotherapy and anticipates a course of 4 weeks of radiotherapy to the right breast.  We will move her simulation until the week of 09/25/2020 so she can complete healing and have additional resolution of her hematoma and her  Port-A-Cath removed. Written consent is obtained and placed in the chart, a copy was provided to the patient.  We will tentatively schedule her for simulation on 09/29/2020 and begin treatment about a week later.  In a visit lasting 45 minutes, greater than 50% of the time was spent face to face reviewing her case, as well as in preparation of, discussing, and coordinating the patient's care.  The above documentation reflects my direct findings during this shared patient visit. Please see the separate note by Dr. [name] on this date for the remainder of the patient's plan of care.    Carola Rhine, PAC

## 2020-09-07 ENCOUNTER — Ambulatory Visit: Payer: 59 | Attending: Hematology and Oncology | Admitting: Physical Therapy

## 2020-09-07 ENCOUNTER — Other Ambulatory Visit: Payer: Self-pay

## 2020-09-07 ENCOUNTER — Encounter: Payer: Self-pay | Admitting: *Deleted

## 2020-09-07 ENCOUNTER — Encounter: Payer: Self-pay | Admitting: Physical Therapy

## 2020-09-07 DIAGNOSIS — C50411 Malignant neoplasm of upper-outer quadrant of right female breast: Secondary | ICD-10-CM | POA: Diagnosis present

## 2020-09-07 DIAGNOSIS — R293 Abnormal posture: Secondary | ICD-10-CM | POA: Insufficient documentation

## 2020-09-07 DIAGNOSIS — Z171 Estrogen receptor negative status [ER-]: Secondary | ICD-10-CM | POA: Insufficient documentation

## 2020-09-07 DIAGNOSIS — Z483 Aftercare following surgery for neoplasm: Secondary | ICD-10-CM | POA: Diagnosis present

## 2020-09-07 DIAGNOSIS — M25611 Stiffness of right shoulder, not elsewhere classified: Secondary | ICD-10-CM | POA: Insufficient documentation

## 2020-09-07 NOTE — Patient Instructions (Addendum)
            [facility]         [address], [place] [address]         [phone]         Annia Friendly, PT, CLT   After Breast Cancer Class It is recommended you attend the ABC class to be educated on lymphedema risk reduction. This class is free of charge and lasts for 1 hour. It is a 1-time class.  You are scheduled for August 1 at 11:00. We will send you a link to join.  Scar massage Once your steri-strips come off, you can begin gentle scar massage and we will teach you how to do that.  Compression garment We recommend you get a compression bra to help with your right breast swelling. You were given a prescription and information about that today.  Home exercise Program Begin doing the exercises we reviewed today.  Follow up PT: It is recommended you return every 3 months for the first 3 years following surgery to be assessed on the SOZO machine for an L-Dex score. This helps prevent clinically significant lymphedema in 95% of patients. These follow up screens are 10 minute appointments that you are not billed for. WE ARE SCHEDULED TO MOVE TO [place] November 13, 2020. APPOINTMENTS FOR SOZO SCREENS AFTER 11/13/2020 WILL BE LOCATED AT [facility] AT [address] [place] [address]. Please call us to confirm we have moved if your appointment is scheduled after October 3rd, 2022. The phone number is [phone]. You are scheduled for October 3rd at 3:00.

## 2020-09-07 NOTE — Therapy (Signed)
[place], [place], [address] Phone: [phone]   Fax:  [phone]  Physical Therapy Treatment  Patient Details  Name: Jacqueline Adams MRN: [id number] Date of Birth: Jun 25, 1960 Referring Provider (PT): Dr. Stark Klein   Encounter Date: 09/07/2020   PT End of Session - 09/07/20 1049     Visit Number 2    Number of Visits 10    Date for PT Re-Evaluation 10/05/20    PT Start Time 1007    PT Stop Time 6063    PT Time Calculation (min) 42 min    Activity Tolerance Patient tolerated treatment well    Behavior During Therapy [facility] for tasks assessed/performed             Past Medical History:  Diagnosis Date   Breast cancer ([place]) 01/2020   right breast IDC   Bronchitis    Cervical spondylarthritis    Headache(784.0)    High cholesterol    Leukocytosis 2009   Multiple sclerosis ([place]) 2009   followed by Dr. Erling Cruz   Neuromuscular disorder [facility])    Pneumonia    Rhinitis    Seasonal allergies    Sinusitis     Past Surgical History:  Procedure Laterality Date   ABDOMINAL HYSTERECTOMY     BREAST LUMPECTOMY WITH RADIOACTIVE SEED AND SENTINEL LYMPH NODE BIOPSY Right 08/08/2020   Procedure: RIGHT BREAST LUMPECTOMY WITH RADIOACTIVE SEED X 3 AND SENTINEL LYMPH NODE BIOPSY;  Surgeon: Stark Klein, MD;  Location: [place];  Service: General;  Laterality: Right;   CESAREAN SECTION     x 3   CORRECTION HAMMER TOE Bilateral    PARTIAL HYSTERECTOMY  2006   for uterine fibroid.    PORTACATH PLACEMENT Right 02/22/2020   Procedure: RIGHT SUBCLAVIAN VEIN PORT PLACEMENT;  Surgeon: Donnie Mesa, MD;  Location: [place];  Service: General;  Laterality: Right;    There were no vitals filed for this visit.   Subjective Assessment - 09/07/20 1011     Subjective She underwent neoadjuvant chemotherapy 02/23/2020 - 07/05/2020 followed by a right lumpectomy and sentinel node biopsy (3 negative  nodes) on 08/08/2020. She will have radiation simulation on 09/29/2020. She reports MS is under control.    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 She underwent neoadjuvant chemotherapy 02/23/2020 - 07/05/2020 followed by a right lumpectomy and sentinel node biopsy (3 negative nodes) on 08/08/2020. She will have radiation simulation on 09/29/2020. She has relapsing remitting multiple sclerosis since 2009.    Patient Stated Goals See how my arm is doing    Currently in Pain? Yes    Pain Score 5     Pain Location Breast    Pain Orientation Right    Pain Descriptors / Indicators Aching    Pain Type Surgical pain    Pain Onset 1 to 4 weeks ago    Pain Frequency Intermittent    Aggravating Factors  Nothing    Pain Relieving Factors Nothing    Multiple Pain Sites No                [facility] PT Assessment - 09/07/20 0001       Assessment   Medical Diagnosis s/p right lumpectomy and SLNB    Referring Provider (PT) Dr. Stark Klein    Onset Date/Surgical Date 08/08/20    Hand Dominance Right    Prior Therapy Baselines  Precautions   Precautions Other (comment)    Precaution Comments Recent surgery; right arm lymphedema risk; MS      Restrictions   Weight Bearing Restrictions No      Balance Screen   Has the patient fallen in the past 6 months No    Has the patient had a decrease in activity level because of a fear of falling?  No    Is the patient reluctant to leave their home because of a fear of falling?  No      Home Environment   Living Environment Private residence    Living Arrangements Spouse/significant other;Children   Husband and 78 y.o. son; dauhgter is away at college   Available Help at Discharge Family      Prior Function   Level of Independence Independent    Vocation Full time employment    Education officer, community at [employer] She gets up every hour and  walks      Cognition   Overall Cognitive Status Within Functional Limits for tasks assessed      Observation/Other Assessments   Observations Incision appears to be healing well with steri-strips still breasent on breast incision. Axillary incision appears to be wel healed. Seroma present in right breast with hardness present.      Posture/Postural Control   Posture/Postural Control Postural limitations    Postural Limitations Rounded Shoulders;Forward head      ROM / Strength   AROM / PROM / Strength AROM      AROM   AROM Assessment Site Shoulder    Right/Left Shoulder Right    Right Shoulder Extension 46 Degrees    Right Shoulder Flexion 123 Degrees    Right Shoulder ABduction 140 Degrees    Right Shoulder Internal Rotation 50 Degrees    Right Shoulder External Rotation 70 Degrees      Strength   Overall Strength Within functional limits for tasks performed               LYMPHEDEMA/ONCOLOGY QUESTIONNAIRE - 09/07/20 0001       Type   Cancer Type Right breast cancer      Surgeries   Lumpectomy Date 08/08/20    Sentinel Lymph Node Biopsy Date 08/08/20    Number Lymph Nodes Removed 3      Treatment   Active Chemotherapy Treatment No    Past Chemotherapy Treatment Yes    Date 07/05/20    Active Radiation Treatment No    Past Radiation Treatment No    Current Hormone Treatment No    Past Hormone Therapy No      What other symptoms do you have   Are you Having Heaviness or Tightness Yes    Are you having Pain Yes    Are you having pitting edema No    Is it Hard or Difficult finding clothes that fit No    Do you have infections No    Is there Decreased scar mobility Yes    [place]mer Sign No      Lymphedema Assessments   Lymphedema Assessments Upper extremities      Right Upper Extremity Lymphedema   10 cm Proximal to Olecranon Process 34.5 cm    Olecranon Process 19.1 cm    10 cm Proximal to Ulnar Styloid Process 24.8 cm    Just Proximal to Ulnar Styloid  Process 17.3 cm    Across Hand at [employer] 20.6 cm    At [employer]  of 2nd Digit 6.6 cm      Left Upper Extremity Lymphedema   10 cm Proximal to Olecranon Process 34.6 cm    Olecranon Process 29.6 cm    10 cm Proximal to Ulnar Styloid Process 23.9 cm    Just Proximal to Ulnar Styloid Process 17.3 cm    Across Hand at [employer] 20.8 cm    At [place] of 2nd Digit 6.8 cm                Quick Dash - 09/07/20 0001     Open a tight or new jar Moderate difficulty    Do heavy household chores (wash walls, wash floors) Moderate difficulty    Carry a shopping bag or briefcase No difficulty    Wash your back Mild difficulty    Use a knife to cut food No difficulty    Recreational activities in which you take some force or impact through your arm, shoulder, or hand (golf, hammering, tennis) Mild difficulty    During the past week, to what extent has your arm, shoulder or hand problem interfered with your normal social activities with family, friends, neighbors, or groups? Not at all    During the past week, to what extent has your arm, shoulder or hand problem limited your work or other regular daily activities Not at all    Arm, shoulder, or hand pain. Mild    Tingling (pins and needles) in your arm, shoulder, or hand Mild    Difficulty Sleeping No difficulty    DASH Score 18.18 %                            PT Education - 09/07/20 1043     Education Details How to wear compression foam in bra and where to get compression bra; aftercare; HEP review    Person(s) Educated Patient    Methods Explanation;Demonstration;Handout    Comprehension Returned demonstration;Verbalized understanding                 PT Long Term Goals - 09/07/20 1052       PT LONG TERM GOAL #1   Title Patient will demonstrate she has regained full shoulder ROM and function post operatively compared to baselines.    Time 4    Period Weeks    Status On-going      PT LONG TERM  GOAL #2   Title Patient will increase right shoulder active flexion to >/= 150 degrees for increased ease reaching overhead.    Baseline 123 post op; 151 pre-op    Time 4    Period Weeks    Status New    Target Date 10/05/20      PT LONG TERM GOAL #3   Title Patient will improve her DASH score to </= 2.27 for improved upper extremity function.    Baseline 18.18 post op; 2.27 pre op    Time 4    Period Weeks    Status New    Target Date 10/05/20      PT LONG TERM GOAL #4   Title Patient will verbalize good understanding of lymhedema precautions.    Time 4    Period Weeks    Status New    Target Date 10/05/20                   Plan - 09/07/20 1049     Clinical Impression Statement  Patient underwent a right lumpectomy and sentinel node biopsy on 08/08/2020 with 3 negative nodes removed. She is healing well but has some ROM limitations and edema present in her right breast with hardness. She will benefit from PT to regain shoulder ROM and function and reduce edema. She will begin radiation on 09/29/2020.    Stability/Clinical Decision Making Stable/Uncomplicated    PT Frequency 2x / week    PT Duration 4 weeks    PT Treatment/Interventions ADLs/Self Care Home Management;Therapeutic exercise;Patient/family education;Passive range of motion;Manual techniques;Manual lymph drainage;Scar mobilization    PT Next Visit Plan PROM right shoulder, ROM exercises    PT Home Exercise Plan Post op shoulder ROM HEP    Consulted and Agree with Plan of Care Patient             Patient will benefit from skilled therapeutic intervention in order to improve the following deficits and impairments:  Postural dysfunction, Decreased knowledge of precautions, Impaired UE functional use, Pain, Decreased range of motion, Increased edema, Decreased scar mobility  Visit Diagnosis: Malignant neoplasm of upper-outer quadrant of right breast in female, estrogen receptor negative ([place]) - Plan: PT plan  of care cert/re-cert  Abnormal posture - Plan: PT plan of care cert/re-cert  Aftercare following surgery for neoplasm - Plan: PT plan of care cert/re-cert  Stiffness of right shoulder, not elsewhere classified - Plan: PT plan of care cert/re-cert     Problem List Patient Active Problem List   Diagnosis Date Noted   Port-A-Cath in place 04/05/2020   Malignant neoplasm of upper-outer quadrant of right breast in female, estrogen receptor negative ([place]) 01/10/2020   High risk medication use 03/25/2019   Microcytic anemia 12/31/2017   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 UACS 12/16/2017   History of pneumonia 11/12/2017   Morbid obesity ([place]) 08/19/2016   Numbness 05/09/2015   Other fatigue 05/09/2015   Arthralgia of hip 11/16/2014   Low back pain with sciatica 11/16/2014   Urinary frequency 11/16/2014   Encounter for general adult medical examination without abnormal findings 06/07/2014   DOE (dyspnea on exertion) 04/27/2014   Atypical chest pain 04/27/2014   Dyslipidemia 04/27/2014   HLD (hyperlipidemia) 02/14/2014   Cervical spondylosis without myelopathy 07/28/2013   Cervical pain 07/28/2013   CFIDS (chronic fatigue and immune dysfunction syndrome) ([place]) 07/28/2013   Chronic infection of sinus 07/28/2013   Headache, migraine 07/28/2013   DS (disseminated sclerosis) ([place]) 07/28/2013   Spasms of the hands or feet 07/28/2013   DDD (degenerative disc disease), cervical 07/28/2013   Menopausal symptom 07/28/2013   Taking drug for chronic disease 07/28/2013   Vitamin D deficiency 07/28/2013   Degeneration of intervertebral disc of cervical region 07/28/2013   Cough 04/06/2013   BRCA gene mutation positive in female 06/12/2011   Genetic susceptibility to malignant neoplasm of breast 06/12/2011   Multiple sclerosis ([place])    Leukocytosis    Annia Friendly, [place] 09/07/20 10:55 AM   [place], [place],  [address] Phone: [phone]   Fax:  [phone]  Name: KENNAH HEHR MRN: [id number] Date of Birth: 02-09-1961

## 2020-09-11 ENCOUNTER — Ambulatory Visit: Payer: 59 | Attending: Hematology and Oncology

## 2020-09-11 ENCOUNTER — Other Ambulatory Visit: Payer: Self-pay

## 2020-09-11 DIAGNOSIS — M25611 Stiffness of right shoulder, not elsewhere classified: Secondary | ICD-10-CM | POA: Insufficient documentation

## 2020-09-11 DIAGNOSIS — Z171 Estrogen receptor negative status [ER-]: Secondary | ICD-10-CM | POA: Diagnosis present

## 2020-09-11 DIAGNOSIS — Z483 Aftercare following surgery for neoplasm: Secondary | ICD-10-CM | POA: Insufficient documentation

## 2020-09-11 DIAGNOSIS — R293 Abnormal posture: Secondary | ICD-10-CM | POA: Insufficient documentation

## 2020-09-11 DIAGNOSIS — C50411 Malignant neoplasm of upper-outer quadrant of right female breast: Secondary | ICD-10-CM | POA: Diagnosis present

## 2020-09-11 NOTE — Therapy (Signed)
[place], [place], [address] Phone: [phone]   Fax:  [phone]  Physical Therapy Treatment  Patient Details  Name: Jacqueline Adams MRN: [id number] Date of Birth: December 08, 1960 Referring Provider (PT): Dr. Stark Klein   Encounter Date: 09/11/2020   PT End of Session - 09/11/20 1009     Visit Number 3    Number of Visits 10    Date for PT Re-Evaluation 10/05/20    PT Start Time 0908    PT Stop Time 1009    PT Time Calculation (min) 61 min    Activity Tolerance Patient tolerated treatment well    Behavior During Therapy [facility] for tasks assessed/performed             Past Medical History:  Diagnosis Date   Breast cancer ([place]) 01/2020   right breast IDC   Bronchitis    Cervical spondylarthritis    Headache(784.0)    High cholesterol    Leukocytosis 2009   Multiple sclerosis ([place]) 2009   followed by Dr. Erling Cruz   Neuromuscular disorder [facility])    Pneumonia    Rhinitis    Seasonal allergies    Sinusitis     Past Surgical History:  Procedure Laterality Date   ABDOMINAL HYSTERECTOMY     BREAST LUMPECTOMY WITH RADIOACTIVE SEED AND SENTINEL LYMPH NODE BIOPSY Right 08/08/2020   Procedure: RIGHT BREAST LUMPECTOMY WITH RADIOACTIVE SEED X 3 AND SENTINEL LYMPH NODE BIOPSY;  Surgeon: Stark Klein, MD;  Location: [place];  Service: General;  Laterality: Right;   CESAREAN SECTION     x 3   CORRECTION HAMMER TOE Bilateral    PARTIAL HYSTERECTOMY  2006   for uterine fibroid.    PORTACATH PLACEMENT Right 02/22/2020   Procedure: RIGHT SUBCLAVIAN VEIN PORT PLACEMENT;  Surgeon: Donnie Mesa, MD;  Location: [place];  Service: General;  Laterality: Right;    There were no vitals filed for this visit.   Subjective Assessment - 09/11/20 0912     Subjective I have an appointment to get measured for a compression bra at [facility] this evening. In the meant ime I got one at  [facility] like Inez Catalina suggested and have been wearing that and I think it's helped some.    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 She underwent neoadjuvant chemotherapy 02/23/2020 - 07/05/2020 followed by a right lumpectomy and sentinel node biopsy (3 negative nodes) on 08/08/2020. She will have radiation simulation on 09/29/2020. She has relapsing remitting multiple sclerosis since 2009.    Patient Stated Goals See how my arm is doing    Currently in Pain? Yes    Pain Score 1     Pain Location Rib cage    Pain Orientation Right    Pain Descriptors / Indicators Tightness    Pain Type Surgical pain    Pain Onset 1 to 4 weeks ago    Pain Frequency Intermittent    Aggravating Factors  comes and goes    Pain Relieving Factors comes and goes                               [facility] Adult PT Treatment/Exercise - 09/11/20 0001       Manual Therapy   Manual Therapy Myofascial release;Manual Lymphatic Drainage (MLD);Passive ROM;Scapular mobilization    Myofascial Release In  Supine to Rt axilla    Scapular Mobilization In Lt S/L to Rt scapula into protraction and retraction    Manual Lymphatic Drainage (MLD) In Supine: Short neck, superficial and deep abdominals, Rt inguinal nodes and Rt axillo-inguinal anastomosis then focused on Rt lateral trunk and focusing on area of hematoma in lateral breast but avoiding steristrips, beginning to instruct pt in this while performing    Passive ROM In Supine into Rt shoulder flexion, abduction and D2 to pts tolerance                         PT Long Term Goals - 09/07/20 1052       PT LONG TERM GOAL #1   Title Patient will demonstrate she has regained full shoulder ROM and function post operatively compared to baselines.    Time 4    Period Weeks    Status On-going      PT LONG TERM GOAL #2   Title Patient will increase right  shoulder active flexion to >/= 150 degrees for increased ease reaching overhead.    Baseline 123 post op; 151 pre-op    Time 4    Period Weeks    Status New    Target Date 10/05/20      PT LONG TERM GOAL #3   Title Patient will improve her DASH score to </= 2.27 for improved upper extremity function.    Baseline 18.18 post op; 2.27 pre op    Time 4    Period Weeks    Status New    Target Date 10/05/20      PT LONG TERM GOAL #4   Title Patient will verbalize good understanding of lymhedema precautions.    Time 4    Period Weeks    Status New    Target Date 10/05/20                   Plan - 09/11/20 1354     Clinical Impression Statement First session of treatment since evaluation today. Pt did well with verbalizing good understanding of basic principles of MLD sequence and was also educated in anatomy of lymphatic system. The fibrosis inferior to her incision at area of hematoma was much softened by end of session today and pt reports her upper quadrant feeling looser after session. Encouraged her to cont inital HEP stretching throughout the day and include end of ROM stretching in doorway. Pt verbalized understanding.    Stability/Clinical Decision Making Stable/Uncomplicated    Rehab Potential Excellent    PT Frequency 2x / week    PT Treatment/Interventions ADLs/Self Care Home Management;Therapeutic exercise;Patient/family education;Passive range of motion;Manual techniques;Manual lymph drainage;Scar mobilization    PT Next Visit Plan Cont PROM right shoulder, instruct in and cont MLD to Rt lateral breast being mindful of steristrips, ROM exercises    PT Home Exercise Plan Post op shoulder ROM HEP    Consulted and Agree with Plan of Care Patient             Patient will benefit from skilled therapeutic intervention in order to improve the following deficits and impairments:  Postural dysfunction, Decreased knowledge of precautions, Impaired UE functional use, Pain,  Decreased range of motion, Increased edema, Decreased scar mobility  Visit Diagnosis: Malignant neoplasm of upper-outer quadrant of right breast in female, estrogen receptor negative (HCC)  Abnormal posture  Aftercare following surgery for neoplasm  Stiffness of right shoulder, not elsewhere classified  Problem List Patient Active Problem List   Diagnosis Date Noted   Port-A-Cath in place 04/05/2020   Malignant neoplasm of upper-outer quadrant of right breast in female, estrogen receptor negative ([place]) 01/10/2020   High risk medication use 03/25/2019   Microcytic anemia 12/31/2017   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 UACS 12/16/2017   History of pneumonia 11/12/2017   Morbid obesity ([place]) 08/19/2016   Numbness 05/09/2015   Other fatigue 05/09/2015   Arthralgia of hip 11/16/2014   Low back pain with sciatica 11/16/2014   Urinary frequency 11/16/2014   Encounter for general adult medical examination without abnormal findings 06/07/2014   DOE (dyspnea on exertion) 04/27/2014   Atypical chest pain 04/27/2014   Dyslipidemia 04/27/2014   HLD (hyperlipidemia) 02/14/2014   Cervical spondylosis without myelopathy 07/28/2013   Cervical pain 07/28/2013   CFIDS (chronic fatigue and immune dysfunction syndrome) ([place]) 07/28/2013   Chronic infection of sinus 07/28/2013   Headache, migraine 07/28/2013   DS (disseminated sclerosis) ([place]) 07/28/2013   Spasms of the hands or feet 07/28/2013   DDD (degenerative disc disease), cervical 07/28/2013   Menopausal symptom 07/28/2013   Taking drug for chronic disease 07/28/2013   Vitamin D deficiency 07/28/2013   Degeneration of intervertebral disc of cervical region 07/28/2013   Cough 04/06/2013   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

## 2020-09-15 ENCOUNTER — Encounter: Payer: Self-pay | Admitting: Rehabilitation

## 2020-09-15 ENCOUNTER — Ambulatory Visit: Payer: 59 | Admitting: Rehabilitation

## 2020-09-15 ENCOUNTER — Other Ambulatory Visit: Payer: Self-pay

## 2020-09-15 DIAGNOSIS — Z171 Estrogen receptor negative status [ER-]: Secondary | ICD-10-CM

## 2020-09-15 DIAGNOSIS — M25611 Stiffness of right shoulder, not elsewhere classified: Secondary | ICD-10-CM

## 2020-09-15 DIAGNOSIS — C50411 Malignant neoplasm of upper-outer quadrant of right female breast: Secondary | ICD-10-CM

## 2020-09-15 DIAGNOSIS — Z483 Aftercare following surgery for neoplasm: Secondary | ICD-10-CM

## 2020-09-15 DIAGNOSIS — R293 Abnormal posture: Secondary | ICD-10-CM

## 2020-09-15 NOTE — Therapy (Signed)
[place], [place], [address] Phone: [phone]   Fax:  [phone]  Physical Therapy Treatment  Patient Details  Name: Jacqueline Adams MRN: [id number] Date of Birth: May 27, 1960 Referring Provider (PT): Dr. Stark Klein   Encounter Date: 09/15/2020   PT End of Session - 09/15/20 1206     Visit Number 4    Number of Visits 10    Date for PT Re-Evaluation 10/05/20    PT Start Time 1106    PT Stop Time 5974    PT Time Calculation (min) 47 min    Activity Tolerance Patient tolerated treatment well    Behavior During Therapy [facility] for tasks assessed/performed             Past Medical History:  Diagnosis Date   Breast cancer ([place]) 01/2020   right breast IDC   Bronchitis    Cervical spondylarthritis    Headache(784.0)    High cholesterol    Leukocytosis 2009   Multiple sclerosis ([place]) 2009   followed by Dr. Erling Cruz   Neuromuscular disorder [facility])    Pneumonia    Rhinitis    Seasonal allergies    Sinusitis     Past Surgical History:  Procedure Laterality Date   ABDOMINAL HYSTERECTOMY     BREAST LUMPECTOMY WITH RADIOACTIVE SEED AND SENTINEL LYMPH NODE BIOPSY Right 08/08/2020   Procedure: RIGHT BREAST LUMPECTOMY WITH RADIOACTIVE SEED X 3 AND SENTINEL LYMPH NODE BIOPSY;  Surgeon: Stark Klein, MD;  Location: [place];  Service: General;  Laterality: Right;   CESAREAN SECTION     x 3   CORRECTION HAMMER TOE Bilateral    PARTIAL HYSTERECTOMY  2006   for uterine fibroid.    PORTACATH PLACEMENT Right 02/22/2020   Procedure: RIGHT SUBCLAVIAN VEIN PORT PLACEMENT;  Surgeon: Donnie Mesa, MD;  Location: [place];  Service: General;  Laterality: Right;    There were no vitals filed for this visit.   Subjective Assessment - 09/15/20 1107     Subjective I got 3 bras.  They feel great    Pertinent History Patient was diagnosed on 12/23/2019 with right triple negative grade II-III  invasive ductal carcinoma breast cancer. She underwent neoadjuvant chemotherapy 02/23/2020 - 07/05/2020 followed by a right lumpectomy and sentinel node biopsy (3 negative nodes) on 08/08/2020. She will have radiation simulation on 09/29/2020. She has relapsing remitting multiple sclerosis since 2009.    Currently in Pain? No/denies                               [facility] Adult PT Treatment/Exercise - 09/15/20 0001       Manual Therapy   Manual Lymphatic Drainage (MLD) In Supine: Short neck, superficial and deep abdominals, bil axillary nodes, anterior interaxillary anastamosis, Rt inguinal nodes and Rt axillo-inguinal anastomosis then focused on Rt breast focusing on area of hematoma in lateral breast and fibrosis medial inferior breast.  Then focused on teaching patient all steps with handout given    Passive ROM In Supine into Rt shoulder flexion, abduction and D2 to pts tolerance                         PT Long Term Goals - 09/07/20 1052       PT LONG TERM GOAL #1   Title Patient will demonstrate she has regained full shoulder ROM and function post  operatively compared to baselines.    Time 4    Period Weeks    Status On-going      PT LONG TERM GOAL #2   Title Patient will increase right shoulder active flexion to >/= 150 degrees for increased ease reaching overhead.    Baseline 123 post op; 151 pre-op    Time 4    Period Weeks    Status New    Target Date 10/05/20      PT LONG TERM GOAL #3   Title Patient will improve her DASH score to </= 2.27 for improved upper extremity function.    Baseline 18.18 post op; 2.27 pre op    Time 4    Period Weeks    Status New    Target Date 10/05/20      PT LONG TERM GOAL #4   Title Patient will verbalize good understanding of lymhedema precautions.    Time 4    Period Weeks    Status New    Target Date 10/05/20                   Plan - 09/15/20 1206     Clinical Impression Statement  Continued MLD to the Rt breast with education on self MLD throughout.  Continues with hard hematoma under incision but does have some inferior and medial fibrosis of the breast so added the whole breast to MLD    PT Frequency 2x / week    PT Duration 4 weeks    PT Treatment/Interventions ADLs/Self Care Home Management;Therapeutic exercise;Patient/family education;Passive range of motion;Manual techniques;Manual lymph drainage;Scar mobilization    PT Next Visit Plan Cont PROM right shoulder, instruct in and cont MLD to Rt lateral breast being mindful of steristrips, ROM exercises    Consulted and Agree with Plan of Care Patient             Patient will benefit from skilled therapeutic intervention in order to improve the following deficits and impairments:     Visit Diagnosis: Malignant neoplasm of upper-outer quadrant of right breast in female, estrogen receptor negative ([place])  Abnormal posture  Aftercare following surgery for neoplasm  Stiffness of right shoulder, not elsewhere classified     Problem List Patient Active Problem List   Diagnosis Date Noted   Port-A-Cath in place 04/05/2020   Malignant neoplasm of upper-outer quadrant of right breast in female, estrogen receptor negative ([place]) 01/10/2020   High risk medication use 03/25/2019   Microcytic anemia 12/31/2017   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 UACS 12/16/2017   History of pneumonia 11/12/2017   Morbid obesity ([place]) 08/19/2016   Numbness 05/09/2015   Other fatigue 05/09/2015   Arthralgia of hip 11/16/2014   Low back pain with sciatica 11/16/2014   Urinary frequency 11/16/2014   Encounter for general adult medical examination without abnormal findings 06/07/2014   DOE (dyspnea on exertion) 04/27/2014   Atypical chest pain 04/27/2014   Dyslipidemia 04/27/2014   HLD (hyperlipidemia) 02/14/2014   Cervical spondylosis without myelopathy 07/28/2013   Cervical pain 07/28/2013   CFIDS (chronic fatigue and immune dysfunction  syndrome) ([place]) 07/28/2013   Chronic infection of sinus 07/28/2013   Headache, migraine 07/28/2013   DS (disseminated sclerosis) ([place]) 07/28/2013   Spasms of the hands or feet 07/28/2013   DDD (degenerative disc disease), cervical 07/28/2013   Menopausal symptom 07/28/2013   Taking drug for chronic disease 07/28/2013   Vitamin D deficiency 07/28/2013   Degeneration of intervertebral disc of cervical region  07/28/2013   Cough 04/06/2013   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

## 2020-09-18 ENCOUNTER — Other Ambulatory Visit: Payer: Self-pay

## 2020-09-18 ENCOUNTER — Encounter: Payer: Self-pay | Admitting: Rehabilitation

## 2020-09-18 ENCOUNTER — Ambulatory Visit: Payer: 59 | Admitting: Rehabilitation

## 2020-09-18 DIAGNOSIS — C50411 Malignant neoplasm of upper-outer quadrant of right female breast: Secondary | ICD-10-CM

## 2020-09-18 DIAGNOSIS — Z171 Estrogen receptor negative status [ER-]: Secondary | ICD-10-CM

## 2020-09-18 DIAGNOSIS — R293 Abnormal posture: Secondary | ICD-10-CM

## 2020-09-18 DIAGNOSIS — M25611 Stiffness of right shoulder, not elsewhere classified: Secondary | ICD-10-CM

## 2020-09-18 DIAGNOSIS — Z483 Aftercare following surgery for neoplasm: Secondary | ICD-10-CM

## 2020-09-18 NOTE — Therapy (Signed)
[place], [place], [address] Phone: [phone]   Fax:  [phone]  Physical Therapy Treatment  Patient Details  Name: Jacqueline Adams MRN: [id number] Date of Birth: 09-Aug-1960 Referring Provider (PT): Dr. Stark Klein   Encounter Date: 09/18/2020   PT End of Session - 09/18/20 1505     Visit Number 5    Number of Visits 10    Date for PT Re-Evaluation 10/05/20    PT Start Time 6060    PT Stop Time 1503    PT Time Calculation (min) 48 min    Activity Tolerance Patient tolerated treatment well    Behavior During Therapy [facility] for tasks assessed/performed             Past Medical History:  Diagnosis Date   Breast cancer ([place]) 01/2020   right breast IDC   Bronchitis    Cervical spondylarthritis    Headache(784.0)    High cholesterol    Leukocytosis 2009   Multiple sclerosis ([place]) 2009   followed by Dr. Erling Cruz   Neuromuscular disorder [facility])    Pneumonia    Rhinitis    Seasonal allergies    Sinusitis     Past Surgical History:  Procedure Laterality Date   ABDOMINAL HYSTERECTOMY     BREAST LUMPECTOMY WITH RADIOACTIVE SEED AND SENTINEL LYMPH NODE BIOPSY Right 08/08/2020   Procedure: RIGHT BREAST LUMPECTOMY WITH RADIOACTIVE SEED X 3 AND SENTINEL LYMPH NODE BIOPSY;  Surgeon: Stark Klein, MD;  Location: [place];  Service: General;  Laterality: Right;   CESAREAN SECTION     x 3   CORRECTION HAMMER TOE Bilateral    PARTIAL HYSTERECTOMY  2006   for uterine fibroid.    PORTACATH PLACEMENT Right 02/22/2020   Procedure: RIGHT SUBCLAVIAN VEIN PORT PLACEMENT;  Surgeon: Donnie Mesa, MD;  Location: [place];  Service: General;  Laterality: Right;    There were no vitals filed for this visit.   Subjective Assessment - 09/18/20 1416     Subjective I tried the massage I think it went okay    Pertinent History Patient was diagnosed on 12/23/2019 with right triple negative  grade II-III invasive ductal carcinoma breast cancer. She underwent neoadjuvant chemotherapy 02/23/2020 - 07/05/2020 followed by a right lumpectomy and sentinel node biopsy (3 negative nodes) on 08/08/2020. She will have radiation simulation on 09/29/2020. She has relapsing remitting multiple sclerosis since 2009.    Currently in Pain? No/denies                               [facility] Adult PT Treatment/Exercise - 09/18/20 0001       Manual Therapy   Scapular Mobilization In Lt S/L to Rt scapula into protraction and retraction    Manual Lymphatic Drainage (MLD) Reviewed steps in seated in front of the mirror with each step read by PT and then performed by pt then In Supine: Short neck, superficial and deep abdominals, bil axillary nodes, anterior interaxillary anastamosis, Rt inguinal nodes and Rt axillo-inguinal anastomosis then focused on Rt breast focusing on area of hematoma in lateral breast and fibrosis medial inferior breast.  then in sidelying lateral breast    Passive ROM In Supine into Rt shoulder flexion, abduction and D2 to pts tolerance                         PT  Long Term Goals - 09/07/20 1052       PT LONG TERM GOAL #1   Title Patient will demonstrate she has regained full shoulder ROM and function post operatively compared to baselines.    Time 4    Period Weeks    Status On-going      PT LONG TERM GOAL #2   Title Patient will increase right shoulder active flexion to >/= 150 degrees for increased ease reaching overhead.    Baseline 123 post op; 151 pre-op    Time 4    Period Weeks    Status New    Target Date 10/05/20      PT LONG TERM GOAL #3   Title Patient will improve her DASH score to </= 2.27 for improved upper extremity function.    Baseline 18.18 post op; 2.27 pre op    Time 4    Period Weeks    Status New    Target Date 10/05/20      PT LONG TERM GOAL #4   Title Patient will verbalize good understanding of lymhedema  precautions.    Time 4    Period Weeks    Status New    Target Date 10/05/20                   Plan - 09/18/20 1506     Clinical Impression Statement Continued MLD to the Rt breast with review of self MLD throughout and in seated.  Continues with hard hematoma under incision.  Has port removal on Thursday.    PT Frequency 2x / week    PT Duration 4 weeks    PT Treatment/Interventions ADLs/Self Care Home Management;Therapeutic exercise;Patient/family education;Passive range of motion;Manual techniques;Manual lymph drainage;Scar mobilization    PT Next Visit Plan Cont PROM right shoulder, instruct in and cont MLD to Rt lateral breast being mindful of steristrips, ROM exercises    Consulted and Agree with Plan of Care Patient             Patient will benefit from skilled therapeutic intervention in order to improve the following deficits and impairments:  Postural dysfunction, Decreased knowledge of precautions, Impaired UE functional use, Pain, Decreased range of motion, Increased edema, Decreased scar mobility  Visit Diagnosis: Malignant neoplasm of upper-outer quadrant of right breast in female, estrogen receptor negative (HCC)  Abnormal posture  Aftercare following surgery for neoplasm  Stiffness of right shoulder, not elsewhere classified     Problem List Patient Active Problem List   Diagnosis Date Noted   Port-A-Cath in place 04/05/2020   Malignant neoplasm of upper-outer quadrant of right breast in female, estrogen receptor negative (HCC) 01/10/2020   High risk medication use 03/25/2019   Microcytic anemia 12/31/2017   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 UACS 12/16/2017   History of pneumonia 11/12/2017   Morbid obesity (HCC) 08/19/2016   Numbness 05/09/2015   Other fatigue 05/09/2015   Arthralgia of hip 11/16/2014   Low back pain with sciatica 11/16/2014   Urinary frequency 11/16/2014   Encounter for general adult medical examination without abnormal findings  06/07/2014   DOE (dyspnea on exertion) 04/27/2014   Atypical chest pain 04/27/2014   Dyslipidemia 04/27/2014   HLD (hyperlipidemia) 02/14/2014   Cervical spondylosis without myelopathy 07/28/2013   Cervical pain 07/28/2013   CFIDS (chronic fatigue and immune dysfunction syndrome) (HCC) 07/28/2013   Chronic infection of sinus 07/28/2013   Headache, migraine 07/28/2013   DS (disseminated sclerosis) (HCC) 07/28/2013   Spasms of   the hands or feet 07/28/2013   DDD (degenerative disc disease), cervical 07/28/2013   Menopausal symptom 07/28/2013   Taking drug for chronic disease 07/28/2013   Vitamin D deficiency 07/28/2013   Degeneration of intervertebral disc of cervical region 07/28/2013   Cough 04/06/2013   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

## 2020-09-19 ENCOUNTER — Other Ambulatory Visit: Payer: Self-pay

## 2020-09-19 ENCOUNTER — Ambulatory Visit (INDEPENDENT_AMBULATORY_CARE_PROVIDER_SITE_OTHER): Payer: 59

## 2020-09-19 ENCOUNTER — Encounter (HOSPITAL_COMMUNITY): Payer: Self-pay | Admitting: General Surgery

## 2020-09-19 DIAGNOSIS — G35 Multiple sclerosis: Secondary | ICD-10-CM | POA: Diagnosis not present

## 2020-09-19 MED ORDER — GADOBENATE DIMEGLUMINE 529 MG/ML IV SOLN
20.0000 mL | Freq: Once | INTRAVENOUS | Status: AC | PRN
  Administered 2020-09-19: 20 mL via INTRAVENOUS

## 2020-09-19 NOTE — Progress Notes (Signed)
PCP - Dr. Ruben Gottron Cardiologist - Dr. Johnsie Cancel EKG -  Chest x-ray -  ECHO -  Cardiac Cath -  CPAP -    ERAS Protcol - 1000  COVID TEST- n/a  Anesthesia review: n/a  -------------  SDW INSTRUCTIONS:  Your procedure is scheduled on Thursday 8/11. Please report to [facility] Main Entrance "A" at 1030 A.M., and check in at the Admitting office. Call this number if you have problems the morning of surgery: [phone]   Remember: Do not eat after midnight the night before your surgery  You may drink clear liquids until 1000 AM the morning of your surgery.   Clear liquids allowed are: Water, Non-Citrus Juices (without pulp), Carbonated Beverages, Clear Tea, Black Coffee Only, and Gatorade   Medications to take morning of surgery with a sip of water include: fluticasone (FLONASE)  Inhaler ---- Please bring all inhalers with you the day of surgery.  prochlorperazine (COMPAZINE)   As of today, STOP taking any Aspirin (unless otherwise instructed by your surgeon), Aleve, Naproxen, Ibuprofen, Motrin, Advil, Goody's, BC's, all herbal medications, fish oil, and all vitamins.    The Morning of Surgery Do not wear jewelry, make-up or nail polish. Do not wear lotions, powders, or perfumes, or deodorant Do not shave 48 hours prior to surgery.  Do not bring valuables to the hospital. [facility] is not responsible for any belongings or valuables.  If you are a smoker, DO NOT Smoke 24 hours prior to surgery  If you wear a CPAP at night please bring your mask the morning of surgery   Remember that you must have someone to transport you home after your surgery, and remain with you for 24 hours if you are discharged the same day.  Please bring cases for contacts, glasses, hearing aids, dentures or bridgework because it cannot be worn into surgery.   Patients discharged the day of surgery will not be allowed to drive home.   Please shower the NIGHT BEFORE/MORNING OF SURGERY (use  antibacterial soap like DIAL soap if possible). Wear comfortable clothes the morning of surgery. Oral Hygiene is also important to reduce your risk of infection.  Remember - BRUSH YOUR TEETH THE MORNING OF SURGERY WITH YOUR REGULAR TOOTHPASTE  Patient denies shortness of breath, fever, cough and chest pain.

## 2020-09-20 ENCOUNTER — Other Ambulatory Visit: Payer: Self-pay | Admitting: Hematology and Oncology

## 2020-09-21 ENCOUNTER — Ambulatory Visit (HOSPITAL_COMMUNITY): Payer: 59 | Admitting: Anesthesiology

## 2020-09-21 ENCOUNTER — Ambulatory Visit (HOSPITAL_COMMUNITY)
Admission: RE | Admit: 2020-09-21 | Discharge: 2020-09-21 | Disposition: A | Discharge status: Home / Self Care | Payer: 59 | Attending: General Surgery | Admitting: General Surgery

## 2020-09-21 ENCOUNTER — Other Ambulatory Visit: Payer: Self-pay

## 2020-09-21 ENCOUNTER — Encounter (HOSPITAL_COMMUNITY): Payer: Self-pay | Admitting: General Surgery

## 2020-09-21 ENCOUNTER — Encounter (HOSPITAL_COMMUNITY)
Admission: RE | Disposition: A | Discharge status: Home / Self Care | Payer: Self-pay | Source: Home / Self Care | Attending: General Surgery

## 2020-09-21 DIAGNOSIS — Z791 Long term (current) use of non-steroidal anti-inflammatories (NSAID): Secondary | ICD-10-CM | POA: Diagnosis not present

## 2020-09-21 DIAGNOSIS — Z9221 Personal history of antineoplastic chemotherapy: Secondary | ICD-10-CM | POA: Diagnosis not present

## 2020-09-21 DIAGNOSIS — Z823 Family history of stroke: Secondary | ICD-10-CM | POA: Diagnosis not present

## 2020-09-21 DIAGNOSIS — Z8042 Family history of malignant neoplasm of prostate: Secondary | ICD-10-CM | POA: Diagnosis not present

## 2020-09-21 DIAGNOSIS — Z79899 Other long term (current) drug therapy: Secondary | ICD-10-CM | POA: Insufficient documentation

## 2020-09-21 DIAGNOSIS — G35 Multiple sclerosis: Secondary | ICD-10-CM | POA: Diagnosis not present

## 2020-09-21 DIAGNOSIS — Z452 Encounter for adjustment and management of vascular access device: Secondary | ICD-10-CM | POA: Diagnosis not present

## 2020-09-21 DIAGNOSIS — Z91013 Allergy to seafood: Secondary | ICD-10-CM | POA: Insufficient documentation

## 2020-09-21 DIAGNOSIS — Z853 Personal history of malignant neoplasm of breast: Secondary | ICD-10-CM | POA: Diagnosis not present

## 2020-09-21 DIAGNOSIS — Z801 Family history of malignant neoplasm of trachea, bronchus and lung: Secondary | ICD-10-CM | POA: Diagnosis not present

## 2020-09-21 DIAGNOSIS — Z803 Family history of malignant neoplasm of breast: Secondary | ICD-10-CM | POA: Diagnosis not present

## 2020-09-21 DIAGNOSIS — Z800 Family history of malignant neoplasm of digestive organs: Secondary | ICD-10-CM | POA: Diagnosis not present

## 2020-09-21 DIAGNOSIS — Z833 Family history of diabetes mellitus: Secondary | ICD-10-CM | POA: Insufficient documentation

## 2020-09-21 DIAGNOSIS — Z888 Allergy status to other drugs, medicaments and biological substances status: Secondary | ICD-10-CM | POA: Insufficient documentation

## 2020-09-21 DIAGNOSIS — Z8041 Family history of malignant neoplasm of ovary: Secondary | ICD-10-CM | POA: Diagnosis not present

## 2020-09-21 DIAGNOSIS — Z884 Allergy status to anesthetic agent status: Secondary | ICD-10-CM | POA: Insufficient documentation

## 2020-09-21 DIAGNOSIS — Z8249 Family history of ischemic heart disease and other diseases of the circulatory system: Secondary | ICD-10-CM | POA: Insufficient documentation

## 2020-09-21 HISTORY — PX: PORT-A-CATH REMOVAL: SHX5289

## 2020-09-21 LAB — CBC
HCT: 35.2 % — ABNORMAL LOW (ref 36.0–46.0)
Hemoglobin: 10.8 g/dL — ABNORMAL LOW (ref 12.0–15.0)
MCH: 25.9 pg — ABNORMAL LOW (ref 26.0–34.0)
MCHC: 30.7 g/dL (ref 30.0–36.0)
MCV: 84.4 fL (ref 80.0–100.0)
Platelets: 266 10*3/uL (ref 150–400)
RBC: 4.17 MIL/uL (ref 3.87–5.11)
RDW: 15.9 % — ABNORMAL HIGH (ref 11.5–15.5)
WBC: 8.7 10*3/uL (ref 4.0–10.5)
nRBC: 0 % (ref 0.0–0.2)

## 2020-09-21 LAB — BASIC METABOLIC PANEL
Anion gap: 9 (ref 5–15)
BUN: <5 mg/dL — ABNORMAL LOW (ref 6–20)
CO2: 25 mmol/L (ref 22–32)
Calcium: 8.9 mg/dL (ref 8.9–10.3)
Chloride: 106 mmol/L (ref 98–111)
Creatinine, Ser: 0.6 mg/dL (ref 0.44–1.00)
GFR, Estimated: >60 mL/min (ref >60)
Glucose, Bld: 108 mg/dL — ABNORMAL HIGH (ref 70–99)
Potassium: 3.5 mmol/L (ref 3.5–5.1)
Sodium: 140 mmol/L (ref 135–145)

## 2020-09-21 SURGERY — REMOVAL PORT-A-CATH
Anesthesia: Monitor Anesthesia Care | Site: Chest | Laterality: Right

## 2020-09-21 MED ORDER — PHENYLEPHRINE 40 MCG/ML (10ML) SYRINGE FOR IV PUSH (FOR BLOOD PRESSURE SUPPORT)
PREFILLED_SYRINGE | INTRAVENOUS | Status: AC
  Filled 2020-09-21: qty 20

## 2020-09-21 MED ORDER — PROPOFOL 500 MG/50ML IV EMUL
INTRAVENOUS | Status: DC | PRN
Start: 2020-09-21 — End: 2020-09-21
  Administered 2020-09-21: 100 ug/kg/min via INTRAVENOUS

## 2020-09-21 MED ORDER — ORAL CARE MOUTH RINSE: 15.0000 mL | Freq: Once | OROMUCOSAL | Status: AC

## 2020-09-21 MED ORDER — ACETAMINOPHEN 500 MG PO TABS
1000.0000 mg | ORAL_TABLET | ORAL | Status: AC
  Administered 2020-09-21: 1000 mg via ORAL
  Filled 2020-09-21: qty 2

## 2020-09-21 MED ORDER — MIDAZOLAM HCL 2 MG/2ML IJ SOLN
INTRAMUSCULAR | Status: DC | PRN
  Administered 2020-09-21: 1 mg via INTRAVENOUS

## 2020-09-21 MED ORDER — LACTATED RINGERS IV SOLN: INTRAVENOUS | Status: DC

## 2020-09-21 MED ORDER — PHENYLEPHRINE 40 MCG/ML (10ML) SYRINGE FOR IV PUSH (FOR BLOOD PRESSURE SUPPORT)
PREFILLED_SYRINGE | INTRAVENOUS | Status: DC | PRN
  Administered 2020-09-21 (×3): 40 ug via INTRAVENOUS

## 2020-09-21 MED ORDER — ONDANSETRON HCL 4 MG/2ML IJ SOLN
INTRAMUSCULAR | Status: AC
  Filled 2020-09-21: qty 2

## 2020-09-21 MED ORDER — CHLORHEXIDINE GLUCONATE CLOTH 2 % EX PADS: 6.0000 | MEDICATED_PAD | Freq: Once | CUTANEOUS | Status: DC

## 2020-09-21 MED ORDER — ONDANSETRON HCL 4 MG/2ML IJ SOLN
INTRAMUSCULAR | Status: DC | PRN
  Administered 2020-09-21: 4 mg via INTRAVENOUS

## 2020-09-21 MED ORDER — ONDANSETRON HCL 4 MG/2ML IJ SOLN: 4.0000 mg | Freq: Once | INTRAMUSCULAR | Status: DC | PRN

## 2020-09-21 MED ORDER — LIDOCAINE HCL 1 % IJ SOLN
INTRAMUSCULAR | Status: AC
  Filled 2020-09-21: qty 20

## 2020-09-21 MED ORDER — FENTANYL CITRATE (PF) 100 MCG/2ML IJ SOLN: 25.0000 ug | INTRAMUSCULAR | Status: DC | PRN

## 2020-09-21 MED ORDER — FENTANYL CITRATE (PF) 250 MCG/5ML IJ SOLN
INTRAMUSCULAR | Status: AC
  Filled 2020-09-21: qty 5

## 2020-09-21 MED ORDER — MIDAZOLAM HCL 2 MG/2ML IJ SOLN
INTRAMUSCULAR | Status: AC
  Filled 2020-09-21: qty 2

## 2020-09-21 MED ORDER — FENTANYL CITRATE (PF) 100 MCG/2ML IJ SOLN
INTRAMUSCULAR | Status: DC | PRN
  Administered 2020-09-21 (×2): 25 ug via INTRAVENOUS

## 2020-09-21 MED ORDER — PROPOFOL 10 MG/ML IV BOLUS
INTRAVENOUS | Status: DC | PRN
Start: 2020-09-21 — End: 2020-09-21
  Administered 2020-09-21: 10 mg via INTRAVENOUS

## 2020-09-21 MED ORDER — CEFAZOLIN SODIUM-DEXTROSE 2-4 GM/100ML-% IV SOLN
2.0000 g | INTRAVENOUS | Status: AC
  Administered 2020-09-21: 2 g via INTRAVENOUS
  Filled 2020-09-21: qty 100

## 2020-09-21 MED ORDER — OXYCODONE HCL 5 MG/5ML PO SOLN
5.0000 mg | Freq: Once | ORAL | Status: DC | PRN
Start: 2020-09-21 — End: 2020-09-21

## 2020-09-21 MED ORDER — BUPIVACAINE-EPINEPHRINE (PF) 0.25% -1:200000 IJ SOLN
INTRAMUSCULAR | Status: AC
  Filled 2020-09-21: qty 30

## 2020-09-21 MED ORDER — LIDOCAINE HCL 1 % IJ SOLN: INTRAMUSCULAR | Status: DC | PRN

## 2020-09-21 MED ORDER — CHLORHEXIDINE GLUCONATE 0.12 % MT SOLN
15.0000 mL | Freq: Once | OROMUCOSAL | Status: AC
  Administered 2020-09-21: 15 mL via OROMUCOSAL
  Filled 2020-09-21: qty 15

## 2020-09-21 MED ORDER — PROPOFOL 10 MG/ML IV BOLUS
INTRAVENOUS | Status: AC
  Filled 2020-09-21: qty 20

## 2020-09-21 MED ORDER — OXYCODONE HCL 5 MG PO TABS: 5.0000 mg | ORAL_TABLET | Freq: Once | ORAL | Status: DC | PRN

## 2020-09-21 SURGICAL SUPPLY — 31 items
ADH SKN CLS APL DERMABOND .7 (GAUZE/BANDAGES/DRESSINGS) ×1
BAG COUNTER SPONGE SURGICOUNT (BAG) ×2 IMPLANT
BAG SPNG CNTER NS LX DISP (BAG) ×1
CHLORAPREP W/TINT 10.5 ML (MISCELLANEOUS) ×2 IMPLANT
COVER SURGICAL LIGHT HANDLE (MISCELLANEOUS) ×2 IMPLANT
DECANTER SPIKE VIAL GLASS SM (MISCELLANEOUS) ×2 IMPLANT
DERMABOND ADVANCED (GAUZE/BANDAGES/DRESSINGS) ×1
DERMABOND ADVANCED .7 DNX12 (GAUZE/BANDAGES/DRESSINGS) ×1 IMPLANT
DRAPE LAPAROTOMY 100X72 PEDS (DRAPES) ×2 IMPLANT
ELECT CAUTERY BLADE 6.4 (BLADE) ×2 IMPLANT
ELECT REM PT RETURN 9FT ADLT (ELECTROSURGICAL) ×2
ELECTRODE REM PT RTRN 9FT ADLT (ELECTROSURGICAL) ×1 IMPLANT
GAUZE 4X4 16PLY ~~LOC~~+RFID DBL (SPONGE) ×2 IMPLANT
GLOVE SURG ENC MOIS LTX SZ6 (GLOVE) ×2 IMPLANT
GLOVE SURG UNDER LTX SZ6.5 (GLOVE) ×2 IMPLANT
GOWN STRL REUS W/ TWL LRG LVL3 (GOWN DISPOSABLE) ×1 IMPLANT
GOWN STRL REUS W/TWL 2XL LVL3 (GOWN DISPOSABLE) ×2 IMPLANT
GOWN STRL REUS W/TWL LRG LVL3 (GOWN DISPOSABLE) ×2
KIT BASIN OR (CUSTOM PROCEDURE TRAY) ×2 IMPLANT
KIT TURNOVER KIT B (KITS) ×2 IMPLANT
NDL HYPO 25GX1X1/2 BEV (NEEDLE) ×1 IMPLANT
NEEDLE HYPO 25GX1X1/2 BEV (NEEDLE) ×2 IMPLANT
NS IRRIG 1000ML POUR BTL (IV SOLUTION) ×2 IMPLANT
PACK GENERAL/GYN (CUSTOM PROCEDURE TRAY) ×2 IMPLANT
PAD ARMBOARD 7.5X6 YLW CONV (MISCELLANEOUS) ×4 IMPLANT
SUT MON AB 4-0 PC3 18 (SUTURE) ×2 IMPLANT
SUT VIC AB 3-0 SH 27 (SUTURE) ×2
SUT VIC AB 3-0 SH 27X BRD (SUTURE) ×1 IMPLANT
SYR CONTROL 10ML LL (SYRINGE) ×2 IMPLANT
TOWEL GREEN STERILE (TOWEL DISPOSABLE) ×2 IMPLANT
TOWEL GREEN STERILE FF (TOWEL DISPOSABLE) ×2 IMPLANT

## 2020-09-21 NOTE — Anesthesia Procedure Notes (Signed)
Procedure Name: MAC Date/Time: 09/21/2020 11:20 AM Performed by: Janene Harvey, CRNA Pre-anesthesia Checklist: Patient identified, Emergency Drugs available, Suction available and Patient being monitored Patient Re-evaluated:Patient Re-evaluated prior to induction Oxygen Delivery Method: Simple face mask Induction Type: IV induction Placement Confirmation: positive ETCO2 Dental Injury: Teeth and Oropharynx as per pre-operative assessment

## 2020-09-21 NOTE — H&P (Signed)
Jacqueline Adams is an 60 y.o. female.   Chief Complaint: breast cancer, s/p chemo HPI:  Pt is a 60 yo F s/p breast conservation for right breast cancer.  She has her port in place in the left subclavian vein and desires removal.   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 Erling Cruz   Neuromuscular disorder [facility])    Pneumonia    Rhinitis    Seasonal allergies    Sinusitis     Past Surgical History:  Procedure Laterality Date   ABDOMINAL HYSTERECTOMY     BREAST LUMPECTOMY WITH RADIOACTIVE SEED AND SENTINEL LYMPH NODE BIOPSY Right 08/08/2020   Procedure: RIGHT BREAST LUMPECTOMY WITH RADIOACTIVE SEED X 3 AND SENTINEL LYMPH NODE BIOPSY;  Surgeon: Stark Klein, MD;  Location: [place];  Service: General;  Laterality: Right;   CESAREAN SECTION     x 3   CORRECTION HAMMER TOE Bilateral    PARTIAL HYSTERECTOMY  2006   for uterine fibroid.    PORTACATH PLACEMENT Right 02/22/2020   Procedure: RIGHT SUBCLAVIAN VEIN PORT PLACEMENT;  Surgeon: Donnie Mesa, MD;  Location: [facility];  Service: General;  Laterality: Right;    Family History  Problem Relation Age of Onset   Cancer Mother 9       ovarian cancer; now with suspected breast cancer   Diabetes Mother        also HTN   Breast cancer Mother    Clotting disorder Father    Prostate cancer Father    Breast cancer Maternal Aunt    Cancer Maternal Grandmother        unknown female reproductive organ cancer   Breast cancer Maternal Aunt    Breast cancer Maternal Aunt    Colon cancer Sister 51       anal cancer (2014)   Lung cancer Sister 59   Stroke Sister 70   Ovarian cancer Sister 79   BRCA 1/2 Sister    Social History:  reports that she has never smoked. She has never used smokeless tobacco. She reports that she  does not drink alcohol and does not use drugs.  Allergies:  Allergies  Allergen Reactions   Procaine Swelling   Shellfish Allergy Anaphylaxis   Relafen [Nabumetone] Swelling    Medications Prior to Admission  Medication Sig Dispense Refill   Calcium Carb-Cholecalciferol (CALCIUM 600+D3 PO) Take 1 tablet by mouth in the morning and at bedtime.     Cholecalciferol (VITAMIN D3) 25 MCG (1000 UT) CAPS Take 1,000 Units by mouth in the morning.     Ferrous Sulfate Dried (SLOW RELEASE IRON) 45 MG TBCR Take 45 mg by mouth in the morning.     fexofenadine (ALLEGRA) 180 MG tablet Take 180 mg by mouth daily as needed for allergies.     fluticasone (FLONASE) 50 MCG/ACT nasal spray Place 2 sprays into both nostrils daily. (Patient taking differently: Place 2 sprays into both nostrils daily as needed for allergies.) 16 g 2   ibuprofen (ADVIL) 200 MG tablet Take 400 mg by mouth every Friday. W/Avonex injection     ibuprofen (ADVIL) 200 MG tablet Take 400 mg by mouth every 6 (six) hours as needed for headache or mild pain.  interferon beta-1a (AVONEX PREFILLED) 30 MCG/0.5ML PSKT injection INJECT ONE SYRINGE INTRAMUSCULARLY ONCE WEEKLY. REFRIGERATE. PROTECT FROM LIGHT. ALLOW TO WARM TO ROOM TEMPERATURE PRIOR TO USE. (Patient taking differently: Inject 30 mcg into the muscle every Friday. INJECT ONE SYRINGE INTRAMUSCULARLY ONCE WEEKLY. REFRIGERATE. PROTECT FROM LIGHT. ALLOW TO WARM TO ROOM TEMPERATURE PRIOR TO USE.) 3 each 3   meclizine (ANTIVERT) 25 MG tablet Take 1 tablet (25 mg total) by mouth 3 (three) times daily as needed for dizziness. 30 tablet 0   metoprolol succinate (TOPROL-XL) 25 MG 24 hr tablet Take 25 mg by mouth daily.     Multiple Vitamin (MULTIVITAMIN WITH MINERALS) TABS tablet Take 1 tablet by mouth every evening. Centrum     Omega-3 Fatty Acids (FISH OIL) 1000 MG CPDR Take 1,000 mg by mouth daily.     simvastatin (ZOCOR) 20 MG tablet Take 20 mg by mouth every evening.     VENTOLIN HFA 108  (90 BASE) MCG/ACT inhaler Inhale 1-2 puffs into the lungs every 6 (six) hours as needed for shortness of breath or wheezing.     KLOR-CON M20 20 MEQ tablet TAKE 1 TABLET (20 MEQ TOTAL) BY MOUTH IN THE MORNING. 30 tablet 1   prochlorperazine (COMPAZINE) 10 MG tablet Take 1 tablet (10 mg total) by mouth every 6 (six) hours as needed (Nausea or vomiting). 30 tablet 1    No results found for this or any previous visit (from the past 48 hour(s)). MR BRAIN W WO CONTRAST  Result Date: 09/19/2020  [facility] [address] [place], [place] [address] [phone] NEUROIMAGING REPORT STUDY DATE: 09/19/2020 PATIENT NAME: Jacqueline Adams DOB: Feb 21, 1960 MRN: [id number] EXAM: MRI Brain with and without contrast ORDERING CLINICIAN: Richard A. Sater, MD. PhD CLINICAL HISTORY: 60 year old woman with multiple sclerosis COMPARISON FILMS: MRI 04/29/2018 TECHNIQUE: MRI of the brain with and without contrast was obtained utilizing 5 mm axial slices with T1, T2, T2 flair, T2 star gradient echo and diffusion weighted views.  T1 sagittal, T2 coronal and postcontrast views in the axial and coronal plane were obtained. CONTRAST: 20 mL MultiHance IMAGING SITE: [facility], [address] FINDINGS: On sagittal images, the spinal cord is imaged caudally to C5 and is normal in caliber.   The contents of the posterior fossa are of normal size and position.   The pituitary gland and optic chiasm appear normal.    Brain volume appears normal.   The ventricles are normal in size and without distortion.  There is a cavum of septum pellucidum et vergae.  There are no abnormal extra-axial collections of fluid.  There are several T2/FLAIR hyperintense foci in the periventricular, juxtacortical and deep white matter, left hemisphere more than right.  The 3 largest foci are in the periventricular white matter at the posterior left frontal lobe, periventricular white matter foci of the left temporal lobe and  in the juxtacortical white matter of the left parietal lobe.  None of these appear to be acute.  They do not enhance.  Compared to the MRI dated 04/29/2018, there are no new lesions.  Cerebellum, brainstem and deep gray matter normal.  Diffusion weighted images are normal.  Gradient echo heme weighted images are normal.  The orbits appear normal.   The VIIth/VIIIth nerve complex appears normal.  The mastoid air cells appear normal.  The paranasal sinuses appear normal.  Flow voids are identified within the major intracerebral arteries.   After the infusion of contrast material, a normal  enhancement pattern is noted.   This MRI of the brain with and without contrast shows the following: 1.   Several T2/FLAIR hyperintense foci in the left hemisphere in a pattern consistent with chronic demyelinating plaque associated with multiple sclerosis.  None of the foci enhance.  Compared to the MRI from 04/29/2018, there are no new lesions. 2.   Normal enhancement pattern.  No acute findings. INTERPRETING PHYSICIAN: Richard A. Felecia Shelling, MD, PhD, FAAN Certified in  Neuroimaging by [employer]   MR [place]  Result Date: 09/19/2020  [facility] [address], [place], [place] [address] [phone] NEUROIMAGING REPORT STUDY DATE: 09/19/2020 PATIENT NAME: Jacqueline Adams DOB: 10-14-1960 MRN: [id number] EXAM: MRI of the cervical spine with and without contrast. ORDERING CLINICIAN: Richard A. Sater, MD. PhD CLINICAL HISTORY: 60 year old woman with multiple sclerosis COMPARISON FILMS: MRI 05/22/2016 TECHNIQUE: MRI of the cervical spine was obtained utilizing 3 mm sagittal slices from the posterior fossa down to the T3-4 level with T1, T2 and inversion recovery views. In addition 4 mm axial slices from H7-3 down to T1-2 level were included with T2 and gradient echo views.  After the infusion contrast, additional T1-weighted images were performed. CONTRAST: 20 mL  MultiHance IMAGING SITE: [facility], [address] [place], [place] [address] FINDINGS: :  On sagittal images, the spine is imaged from above the cervicomedullary junction to T2.   The spinal cord is of normal caliber and signal.   Specifically, there are no demyelinating plaques.  Visible brain appears normal.  Paravertebral soft tissue appears normal. The vertebral bodies are normally aligned.   The vertebral bodies have normal signal.  The discs and interspaces were further evaluated on axial views from C2 to T1 as follows: C2-C3: There is a small central disc bulge that does not lead to spinal stenosis or nerve root compression. C3-C4: Left greater than right disc osteophyte complexes.  There is mild to moderate bilateral foraminal narrowing but no nerve root compression.  No spinal stenosis. C4-C5: Right greater than left disc osteophyte complexes causing moderate right and mild left foraminal narrowing.  There is no spinal stenosis or nerve root compression. C5-C6: Bilateral disc osteophyte complexes causing mild foraminal narrowing but no spinal stenosis or nerve root compression. C6-C7: Disc bulging.  No significant foraminal narrowing.  No nerve root compression or spinal stenosis. C7-T1: The disc and interspace appear normal. Compared to the MRI dated 05/22/2016, the degenerative changes are clearly stable. After the infusion contrast, normal enhancement pattern was observed.   This MRI of the cervical spine with and without contrast shows the following: 1.   The spinal cord appears normal before and after contrast. 2.   Multilevel degenerative changes as detailed above.  There is no spinal stenosis or nerve root compression. 3.   Normal enhancement pattern. INTERPRETING PHYSICIAN: Richard A. Felecia Shelling, MD, PhD, FAAN Certified in  Neuroimaging by [employer]    Review of Systems  All other systems reviewed and are negative.  Blood pressure 128/62, pulse 92,  temperature 98.7 F (37.1 C), temperature source Oral, resp. rate 20, height 5' 4.25" (1.632 m), weight 95.3 kg, SpO2 100 %. Physical Exam Constitutional:      Appearance: Normal appearance.  HENT:     Head: Normocephalic and atraumatic.  Eyes:     General: No scleral icterus.    Extraocular Movements: Extraocular movements intact.     Pupils: Pupils are equal, round, and  reactive to light.  Cardiovascular:     Rate and Rhythm: Normal rate.     Pulses: Normal pulses.  Pulmonary:     Comments: Port in place left chest.   Chest:     Comments: Right breast hematoma, resolving.   Abdominal:     General: Abdomen is flat.  Neurological:     Mental Status: She is alert.     Assessment/Plan Right breast cancer, port in place. Plan port removal Discussed surgery, risks, and restrictions.     Stark Klein, MD 09/21/2020, 9:40 AM

## 2020-09-21 NOTE — Transfer of Care (Signed)
Immediate Anesthesia Transfer of Care Note  Patient: [name]  Procedure(s) Performed: REMOVAL PORT-A-CATH (Right: Chest)  Patient Location: PACU  Anesthesia Type:MAC  Level of Consciousness: awake, alert  and patient cooperative  Airway & Oxygen Therapy: Patient Spontanous Breathing and Patient connected to face mask oxygen  Post-op Assessment: Report given to RN and Post -op Vital signs reviewed and stable  Post vital signs: Reviewed  Last Vitals:  Vitals Value Taken Time  BP 108/70 09/21/20 1157  Temp 36.2 C 09/21/20 1157  Pulse 81 09/21/20 1157  Resp 25 09/21/20 1157  SpO2 100 % 09/21/20 1157  Vitals shown include unvalidated device data.  Last Pain:  Vitals:   09/21/20 0955  TempSrc:   PainSc: 0-No pain         Complications: No notable events documented.

## 2020-09-21 NOTE — Anesthesia Preprocedure Evaluation (Addendum)
Anesthesia Evaluation  Patient identified by MRN, date of birth, ID band Patient awake    Reviewed: Allergy & Precautions, NPO status , Patient's Chart, lab work & pertinent test results  History of Anesthesia Complications Negative for: history of anesthetic complications  Airway Mallampati: III  TM Distance: >3 FB Neck ROM: Full    Dental  (+) Dental Advisory Given   Pulmonary asthma ,    Pulmonary exam normal        Cardiovascular negative cardio ROS Normal cardiovascular exam     Neuro/Psych  Headaches,  Neuromuscular disease (MS) negative psych ROS   GI/Hepatic negative GI ROS, Neg liver ROS,   Endo/Other   Obesity   Renal/GU negative Renal ROS     Musculoskeletal  (+) Arthritis ,   Abdominal   Peds  Hematology negative hematology ROS (+)   Anesthesia Other Findings   Reproductive/Obstetrics  Breast cancer                             Anesthesia Physical Anesthesia Plan  ASA: 3  Anesthesia Plan: MAC   Post-op Pain Management:    Induction:   PONV Risk Score and Plan: 2 and Propofol infusion and Treatment may vary due to age or medical condition  Airway Management Planned: Natural Airway and Simple Face Mask  Additional Equipment: None  Intra-op Plan:   Post-operative Plan:   Informed Consent: I have reviewed the patients History and Physical, chart, labs and discussed the procedure including the risks, benefits and alternatives for the proposed anesthesia with the patient or authorized representative who has indicated his/her understanding and acceptance.       Plan Discussed with: CRNA and Anesthesiologist  Anesthesia Plan Comments:        Anesthesia Quick Evaluation

## 2020-09-21 NOTE — Op Note (Signed)
  PRE-OPERATIVE DIAGNOSIS:  un-needed Port-A-Cath for right breat cancer  POST-OPERATIVE DIAGNOSIS:  Same   PROCEDURE:  Procedure(s):  REMOVAL PORT-A-CATH, right subclavian location  SURGEON:  Surgeon(s):  Stark Klein, MD  ANESTHESIA:   MAC + local  EBL:   Minimal  SPECIMEN:  None  Complications : none known  Procedure:   Pt was  identified in the holding area and taken to the operating room where she was placed supine on the operating room table.  MAC anesthesia was induced.  The right upper chest was prepped and draped.  The prior incision was anesthetized with local anesthetic.  The incision was opened with a #15 blade.  The subcutaneous tissue was divided with the cautery.  The port was identified and the capsule opened.  The two 2-0 prolene sutures were removed.  The port was then removed and pressure held on the tract.  The catheter appeared intact without evidence of breakage, length was 16.5 cm.  The wound was inspected for hemostasis, which was achieved with cautery.  The wound was closed with 3-0 vicryl deep dermal interrupted sutures and 4-0 Monocryl running subcuticular suture.  The wound was cleaned, dried, and dressed with dermabond.  The patient was awakened from anesthesia and taken to the PACU in stable condition.  Needle, sponge, and instrument counts are correct.

## 2020-09-21 NOTE — Discharge Instructions (Addendum)
[place] Office Phone Number [phone]   POST OP INSTRUCTIONS  Always review your discharge instruction sheet given to you by the facility where your surgery was performed.  IF YOU HAVE DISABILITY OR FAMILY LEAVE FORMS, YOU MUST BRING THEM TO THE OFFICE FOR PROCESSING.  DO NOT GIVE THEM TO YOUR DOCTOR.  A prescription for pain medication may be given to you upon discharge.  Take your pain medication as prescribed, if needed.  If narcotic pain medicine is not needed, then you may take acetaminophen (Tylenol) or ibuprofen (Advil) as needed. Take your usually prescribed medications unless otherwise directed If you need a refill on your pain medication, please contact your pharmacy.  They will contact our office to request authorization.  Prescriptions will not be filled after 5pm or on week-ends. You should eat very light the first 24 hours after surgery, such as soup, crackers, pudding, etc.  Resume your normal diet the day after surgery It is common to experience some constipation if taking pain medication after surgery.  Increasing fluid intake and taking a stool softener will usually help or prevent this problem from occurring.  A mild laxative (Milk of Magnesia or Miralax) should be taken according to package directions if there are no bowel movements after 48 hours. You may shower in 48 hours.  The surgical glue will flake off in 2-3 weeks.   ACTIVITIES:  No strenuous activity or heavy lifting for 1 week.   You may drive when you no longer are taking prescription pain medication, you can comfortably wear a seatbelt, and you can safely maneuver your car and apply brakes. RETURN TO WORK:  __________2-7 days as tolerated as long as no heavy lifting for 1 week._______________ You should see your doctor in the office for a follow-up appointment approximately three-four weeks after your surgery.    WHEN TO CALL YOUR DOCTOR: Fever over 101.0 Nausea and/or vomiting. Extreme  swelling or bruising. Continued bleeding from incision. Increased pain, redness, or drainage from the incision.  The clinic staff is available to answer your questions during regular business hours.  Please don't hesitate to call and ask to speak to one of the nurses for clinical concerns.  If you have a medical emergency, go to the nearest emergency room or call 911.  A surgeon from [facility] is always on call at the hospital.  For further questions, please visit [email or web address]

## 2020-09-21 NOTE — Anesthesia Postprocedure Evaluation (Signed)
Anesthesia Post Note  Patient: Jacqueline Adams  Procedure(s) Performed: REMOVAL PORT-A-CATH (Right: Chest)     Patient location during evaluation: PACU Anesthesia Type: MAC Level of consciousness: awake and alert Pain management: pain level controlled Vital Signs Assessment: post-procedure vital signs reviewed and stable Respiratory status: spontaneous breathing, nonlabored ventilation and respiratory function stable Cardiovascular status: stable and blood pressure returned to baseline Anesthetic complications: no   No notable events documented.  Last Vitals:  Vitals:   09/21/20 1211 09/21/20 1223  BP: 112/67 116/68  Pulse: 75 69  Resp: (!) 22 20  Temp:  36.7 C  SpO2: 98% 99%    Last Pain:  Vitals:   09/21/20 1223  TempSrc:   PainSc: 0-No pain                 [name]

## 2020-09-22 ENCOUNTER — Encounter (HOSPITAL_COMMUNITY): Payer: Self-pay | Admitting: General Surgery

## 2020-09-22 ENCOUNTER — Ambulatory Visit: Payer: 59 | Admitting: Rehabilitation

## 2020-09-26 ENCOUNTER — Other Ambulatory Visit: Payer: Self-pay

## 2020-09-26 ENCOUNTER — Ambulatory Visit: Payer: 59

## 2020-09-26 DIAGNOSIS — R293 Abnormal posture: Secondary | ICD-10-CM

## 2020-09-26 DIAGNOSIS — C50411 Malignant neoplasm of upper-outer quadrant of right female breast: Secondary | ICD-10-CM | POA: Diagnosis not present

## 2020-09-26 DIAGNOSIS — Z171 Estrogen receptor negative status [ER-]: Secondary | ICD-10-CM

## 2020-09-26 DIAGNOSIS — Z483 Aftercare following surgery for neoplasm: Secondary | ICD-10-CM

## 2020-09-26 DIAGNOSIS — M25611 Stiffness of right shoulder, not elsewhere classified: Secondary | ICD-10-CM

## 2020-09-26 NOTE — Therapy (Signed)
[place], [place], [address] Phone: [phone]   Fax:  [phone]  Physical Therapy Treatment  Patient Details  Name: Jacqueline Adams MRN: [id number] Date of Birth: 27-Jan-1961 Referring Provider (PT): Dr. Stark Klein   Encounter Date: 09/26/2020   PT End of Session - 09/26/20 1209     Visit Number 6    Number of Visits 10    Date for PT Re-Evaluation 10/05/20    PT Start Time 1107    PT Stop Time 1202    PT Time Calculation (min) 55 min    Activity Tolerance Patient tolerated treatment well    Behavior During Therapy [facility] for tasks assessed/performed             Past Medical History:  Diagnosis Date   Breast cancer ([place]) 01/2020   right breast IDC   Bronchitis    Cervical spondylarthritis    Headache(784.0)    High cholesterol    Leukocytosis 2009   Multiple sclerosis ([place]) 2009   followed by Dr. Erling Cruz   Neuromuscular disorder [facility])    Pneumonia    Rhinitis    Seasonal allergies    Sinusitis     Past Surgical History:  Procedure Laterality Date   ABDOMINAL HYSTERECTOMY     BREAST LUMPECTOMY WITH RADIOACTIVE SEED AND SENTINEL LYMPH NODE BIOPSY Right 08/08/2020   Procedure: RIGHT BREAST LUMPECTOMY WITH RADIOACTIVE SEED X 3 AND SENTINEL LYMPH NODE BIOPSY;  Surgeon: Stark Klein, MD;  Location: [place];  Service: General;  Laterality: Right;   CESAREAN SECTION     x 3   CORRECTION HAMMER TOE Bilateral    PARTIAL HYSTERECTOMY  2006   for uterine fibroid.    PORT-A-CATH REMOVAL Right 09/21/2020   Procedure: REMOVAL PORT-A-CATH;  Surgeon: Stark Klein, MD;  Location: [place];  Service: General;  Laterality: Right;   PORTACATH PLACEMENT Right 02/22/2020   Procedure: RIGHT SUBCLAVIAN VEIN PORT PLACEMENT;  Surgeon: Donnie Mesa, MD;  Location: [place];  Service: General;  Laterality: Right;    There were no vitals filed for this visit.   Subjective Assessment -  09/26/20 1112     Subjective I got my port removed last week and it's still a little tender but healing okay. My Rt breast is just feeling tender around the incision today.    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 She underwent neoadjuvant chemotherapy 02/23/2020 - 07/05/2020 followed by a right lumpectomy and sentinel node biopsy (3 negative nodes) on 08/08/2020. She will have radiation simulation on 09/29/2020. She has relapsing remitting multiple sclerosis since 2009.    Patient Stated Goals See how my arm is doing    Currently in Pain? No/denies                               [facility] Adult PT Treatment/Exercise - 09/26/20 0001       Manual Therapy   Scapular Mobilization In Lt S/L to Rt scapula into protraction and retraction    Manual Lymphatic Drainage (MLD) In Supine: Short neck, superficial and deep abdominals, Lt axillary nodes, anterior interaxillary anastamosis, Rt inguinal nodes and Rt axillo-inguinal anastomosis then focused on Rt breast focusing on area of hematoma in lateral breast and fibrosis medial inferior breast.  then in sidelying lateral breast    Passive ROM In Supine  into Rt shoulder flexion, abduction and D2 to pts tolerance                         PT Long Term Goals - 09/07/20 1052       PT LONG TERM GOAL #1   Title Patient will demonstrate she has regained full shoulder ROM and function post operatively compared to baselines.    Time 4    Period Weeks    Status On-going      PT LONG TERM GOAL #2   Title Patient will increase right shoulder active flexion to >/= 150 degrees for increased ease reaching overhead.    Baseline 123 post op; 151 pre-op    Time 4    Period Weeks    Status New    Target Date 10/05/20      PT LONG TERM GOAL #3   Title Patient will improve her DASH score to </= 2.27 for improved upper extremity function.     Baseline 18.18 post op; 2.27 pre op    Time 4    Period Weeks    Status New    Target Date 10/05/20      PT LONG TERM GOAL #4   Title Patient will verbalize good understanding of lymhedema precautions.    Time 4    Period Weeks    Status New    Target Date 10/05/20                   Plan - 09/26/20 1210     Clinical Impression Statement Able to resume manaul therapy since pt having her port removed last week. Her Rt shoulder P/ROM improved some by end of session. She has her radiation simulation Friday.    Stability/Clinical Decision Making Stable/Uncomplicated    Rehab Potential Excellent    PT Frequency 2x / week    PT Duration 4 weeks    PT Treatment/Interventions ADLs/Self Care Home Management;Therapeutic exercise;Patient/family education;Passive range of motion;Manual techniques;Manual lymph drainage;Scar mobilization    PT Next Visit Plan Cont PROM right shoulder, instruct in and cont MLD to Rt lateral breast, ROM exercises    PT Home Exercise Plan Post op shoulder ROM HEP    Consulted and Agree with Plan of Care Patient             Patient will benefit from skilled therapeutic intervention in order to improve the following deficits and impairments:  Postural dysfunction, Decreased knowledge of precautions, Impaired UE functional use, Pain, Decreased range of motion, Increased edema, Decreased scar mobility  Visit Diagnosis: Malignant neoplasm of upper-outer quadrant of right breast in female, estrogen receptor negative ([place])  Abnormal posture  Aftercare following surgery for neoplasm  Stiffness of right shoulder, not elsewhere classified     Problem List Patient Active Problem List   Diagnosis Date Noted   Port-A-Cath in place 04/05/2020   Malignant neoplasm of upper-outer quadrant of right breast in female, estrogen receptor negative ([place]) 01/10/2020   High risk medication use 03/25/2019   Microcytic anemia 12/31/2017   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  UACS 12/16/2017   History of pneumonia 11/12/2017   Morbid obesity ([place]) 08/19/2016   Numbness 05/09/2015   Other fatigue 05/09/2015   Arthralgia of hip 11/16/2014   Low back pain with sciatica 11/16/2014   Urinary frequency 11/16/2014   Encounter for general adult medical examination without abnormal findings 06/07/2014   DOE (dyspnea on exertion) 04/27/2014   Atypical chest  pain 04/27/2014   Dyslipidemia 04/27/2014   HLD (hyperlipidemia) 02/14/2014   Cervical spondylosis without myelopathy 07/28/2013   Cervical pain 07/28/2013   CFIDS (chronic fatigue and immune dysfunction syndrome) ([place]) 07/28/2013   Chronic infection of sinus 07/28/2013   Headache, migraine 07/28/2013   DS (disseminated sclerosis) ([place]) 07/28/2013   Spasms of the hands or feet 07/28/2013   DDD (degenerative disc disease), cervical 07/28/2013   Menopausal symptom 07/28/2013   Taking drug for chronic disease 07/28/2013   Vitamin D deficiency 07/28/2013   Degeneration of intervertebral disc of cervical region 07/28/2013   Cough 04/06/2013   BRCA gene mutation positive in female 06/12/2011   Genetic susceptibility to malignant neoplasm of breast 06/12/2011   Multiple sclerosis ([place])    Leukocytosis     Otelia Limes, PTA 09/26/2020, 12:12 PM  [place], [place], [address] Phone: [phone]   Fax:  [phone]  Name: ZOHA SPRANGER MRN: [id number] Date of Birth: [birth year]

## 2020-09-28 ENCOUNTER — Ambulatory Visit: Payer: 59

## 2020-09-28 ENCOUNTER — Other Ambulatory Visit: Payer: Self-pay

## 2020-09-28 DIAGNOSIS — C50411 Malignant neoplasm of upper-outer quadrant of right female breast: Secondary | ICD-10-CM

## 2020-09-28 NOTE — Therapy (Signed)
[place], [place], [address] Phone: [phone]   Fax:  [phone]  Physical Therapy Treatment  Patient Details  Name: Jacqueline Adams MRN: [id number] Date of Birth: [birth year] Referring Provider (PT): Dr. Stark Klein   Encounter Date: 09/28/2020   PT End of Session - 09/28/20 0930     Visit Number 6   # unchanged due to no treatment today   Number of Visits 10    Date for PT Re-Evaluation 10/05/20    PT Start Time 0908    PT Stop Time 0925    PT Time Calculation (min) 17 min    Activity Tolerance Treatment limited secondary to medical complications (Comment)    Behavior During Therapy [facility] for tasks assessed/performed             Past Medical History:  Diagnosis Date   Breast cancer ([place]) 01/2020   right breast IDC   Bronchitis    Cervical spondylarthritis    Headache(784.0)    High cholesterol    Leukocytosis 2009   Multiple sclerosis ([place]) 2009   followed by Dr. Erling Cruz   Neuromuscular disorder [facility])    Pneumonia    Rhinitis    Seasonal allergies    Sinusitis     Past Surgical History:  Procedure Laterality Date   ABDOMINAL HYSTERECTOMY     BREAST LUMPECTOMY WITH RADIOACTIVE SEED AND SENTINEL LYMPH NODE BIOPSY Right 08/08/2020   Procedure: RIGHT BREAST LUMPECTOMY WITH RADIOACTIVE SEED X 3 AND SENTINEL LYMPH NODE BIOPSY;  Surgeon: Stark Klein, MD;  Location: [place];  Service: General;  Laterality: Right;   CESAREAN SECTION     x 3   CORRECTION HAMMER TOE Bilateral    PARTIAL HYSTERECTOMY  2006   for uterine fibroid.    PORT-A-CATH REMOVAL Right 09/21/2020   Procedure: REMOVAL PORT-A-CATH;  Surgeon: Stark Klein, MD;  Location: [place];  Service: General;  Laterality: Right;   PORTACATH PLACEMENT Right 02/22/2020   Procedure: RIGHT SUBCLAVIAN VEIN PORT PLACEMENT;  Surgeon: Donnie Mesa, MD;  Location: [facility];  Service: General;  Laterality: Right;     There were no vitals filed for this visit.   Subjective Assessment - 09/28/20 0928     Subjective When I woke up this morning my lumpectomy incision was red, swollen and warm to touch. I want you to check it.    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 She underwent neoadjuvant chemotherapy 02/23/2020 - 07/05/2020 followed by a right lumpectomy and sentinel node biopsy (3 negative nodes) on 08/08/2020. She will have radiation simulation on 09/29/2020. She has relapsing remitting multiple sclerosis since 2009.    Patient Stated Goals See how my arm is doing    Currently in Pain? No/denies                                            PT Long Term Goals - 09/07/20 1052       PT LONG TERM GOAL #1   Title Patient will demonstrate she has regained full shoulder ROM and function post operatively compared to baselines.    Time 4    Period Weeks    Status On-going      PT LONG TERM GOAL #2   Title Patient will increase right  shoulder active flexion to >/= 150 degrees for increased ease reaching overhead.    Baseline 123 post op; 151 pre-op    Time 4    Period Weeks    Status New    Target Date 10/05/20      PT LONG TERM GOAL #3   Title Patient will improve her DASH score to </= 2.27 for improved upper extremity function.    Baseline 18.18 post op; 2.27 pre op    Time 4    Period Weeks    Status New    Target Date 10/05/20      PT LONG TERM GOAL #4   Title Patient will verbalize good understanding of lymhedema precautions.    Time 4    Period Weeks    Status New    Target Date 10/05/20                   Plan - 09/28/20 0935     Clinical Impression Statement No treatment today. Pt arrived reporting signs of infection at her Rt breast lumpectomy incision. Upon inspection the incision site was red, swollen and warm to touch. Also increased  lymphedema present inferior to incision, more so than at last session this week and pt reports breast feeling heavier as well. Called Dr. Chuck Hint' office and they were able to have her come immediately to be assessed. Educated pt to hold off on self MLD for a few days until visible symptoms start showing signs of improvement. She verbalized understanding.    PT Next Visit Plan How was doctor appt? If signs/symptoms improved then cont PROM right shoulder, instruct in and cont MLD to Rt lateral breast, ROM exercises    PT Home Exercise Plan Post op shoulder ROM HEP; self MLD    Consulted and Agree with Plan of Care Patient             Patient will benefit from skilled therapeutic intervention in order to improve the following deficits and impairments:     Visit Diagnosis: Malignant neoplasm of upper-outer quadrant of right breast in female, estrogen receptor negative ([place])     Problem List Patient Active Problem List   Diagnosis Date Noted   Port-A-Cath in place 04/05/2020   Malignant neoplasm of upper-outer quadrant of right breast in female, estrogen receptor negative ([place]) 01/10/2020   High risk medication use 03/25/2019   Microcytic anemia 12/31/2017   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 UACS 12/16/2017   History of pneumonia 11/12/2017   Morbid obesity ([place]) 08/19/2016   Numbness 05/09/2015   Other fatigue 05/09/2015   Arthralgia of hip 11/16/2014   Low back pain with sciatica 11/16/2014   Urinary frequency 11/16/2014   Encounter for general adult medical examination without abnormal findings 06/07/2014   DOE (dyspnea on exertion) 04/27/2014   Atypical chest pain 04/27/2014   Dyslipidemia 04/27/2014   HLD (hyperlipidemia) 02/14/2014   Cervical spondylosis without myelopathy 07/28/2013   Cervical pain 07/28/2013   CFIDS (chronic fatigue and immune dysfunction syndrome) ([place]) 07/28/2013   Chronic infection of sinus 07/28/2013   Headache, migraine 07/28/2013   DS (disseminated  sclerosis) ([place]) 07/28/2013   Spasms of the hands or feet 07/28/2013   DDD (degenerative disc disease), cervical 07/28/2013   Menopausal symptom 07/28/2013   Taking drug for chronic disease 07/28/2013   Vitamin D deficiency 07/28/2013   Degeneration of intervertebral disc of cervical region 07/28/2013   Cough 04/06/2013   BRCA gene mutation positive in female 06/12/2011  Genetic susceptibility to malignant neoplasm of breast 06/12/2011   Multiple sclerosis ([facility])    Leukocytosis     Otelia Limes, [place] 09/28/2020, 9:39 AM  [place], [place], [address] Phone: [phone]   Fax:  [phone]  Name: LADASHA SCHNACKENBERG MRN: [id number] Date of Birth: Mar 16, 1960

## 2020-09-29 ENCOUNTER — Ambulatory Visit: Admission: RE | Admit: 2020-09-29 | Payer: 59 | Source: Ambulatory Visit | Admitting: Radiation Oncology

## 2020-10-02 ENCOUNTER — Ambulatory Visit: Payer: 59 | Admitting: Physical Therapy

## 2020-10-04 ENCOUNTER — Encounter: Payer: Self-pay | Admitting: *Deleted

## 2020-10-09 ENCOUNTER — Ambulatory Visit: Payer: 59 | Admitting: Radiation Oncology

## 2020-10-10 ENCOUNTER — Ambulatory Visit: Payer: 59

## 2020-10-10 ENCOUNTER — Encounter: Payer: Self-pay | Admitting: Rehabilitation

## 2020-10-11 ENCOUNTER — Ambulatory Visit: Payer: 59

## 2020-10-12 ENCOUNTER — Ambulatory Visit: Payer: 59

## 2020-10-12 ENCOUNTER — Ambulatory Visit: Payer: 59 | Admitting: Radiation Oncology

## 2020-10-13 ENCOUNTER — Ambulatory Visit: Payer: 59

## 2020-10-17 ENCOUNTER — Ambulatory Visit: Payer: 59

## 2020-10-18 ENCOUNTER — Ambulatory Visit: Payer: 59

## 2020-10-18 ENCOUNTER — Encounter: Payer: Self-pay | Admitting: *Deleted

## 2020-10-19 ENCOUNTER — Ambulatory Visit: Payer: 59

## 2020-10-19 ENCOUNTER — Encounter: Payer: Self-pay | Admitting: *Deleted

## 2020-10-20 ENCOUNTER — Other Ambulatory Visit: Payer: Self-pay | Admitting: Hematology and Oncology

## 2020-10-20 ENCOUNTER — Ambulatory Visit: Payer: 59

## 2020-10-23 ENCOUNTER — Ambulatory Visit: Payer: 59

## 2020-10-24 ENCOUNTER — Ambulatory Visit: Payer: 59 | Admitting: Rehabilitation

## 2020-10-24 ENCOUNTER — Ambulatory Visit: Payer: 59

## 2020-10-25 ENCOUNTER — Ambulatory Visit: Payer: 59

## 2020-10-26 ENCOUNTER — Ambulatory Visit: Payer: 59

## 2020-10-27 ENCOUNTER — Ambulatory Visit: Payer: 59

## 2020-10-27 ENCOUNTER — Ambulatory Visit: Payer: 59 | Admitting: Radiation Oncology

## 2020-10-30 ENCOUNTER — Ambulatory Visit: Payer: 59

## 2020-10-31 ENCOUNTER — Other Ambulatory Visit: Payer: Self-pay

## 2020-10-31 ENCOUNTER — Ambulatory Visit
Admission: RE | Admit: 2020-10-31 | Discharge: 2020-10-31 | Disposition: A | Discharge status: Home / Self Care | Payer: 59 | Source: Ambulatory Visit | Attending: Radiation Oncology | Admitting: Radiation Oncology

## 2020-10-31 ENCOUNTER — Ambulatory Visit: Payer: 59

## 2020-10-31 DIAGNOSIS — Z510 Encounter for antineoplastic radiation therapy: Secondary | ICD-10-CM | POA: Diagnosis not present

## 2020-10-31 DIAGNOSIS — C50411 Malignant neoplasm of upper-outer quadrant of right female breast: Secondary | ICD-10-CM | POA: Diagnosis not present

## 2020-10-31 DIAGNOSIS — Z171 Estrogen receptor negative status [ER-]: Secondary | ICD-10-CM | POA: Diagnosis not present

## 2020-11-01 ENCOUNTER — Ambulatory Visit: Payer: 59

## 2020-11-02 ENCOUNTER — Ambulatory Visit: Payer: 59

## 2020-11-03 ENCOUNTER — Ambulatory Visit: Payer: 59

## 2020-11-06 ENCOUNTER — Encounter: Payer: Self-pay | Admitting: *Deleted

## 2020-11-06 ENCOUNTER — Ambulatory Visit: Payer: 59

## 2020-11-07 ENCOUNTER — Ambulatory Visit: Payer: 59 | Admitting: Radiation Oncology

## 2020-11-07 ENCOUNTER — Telehealth: Payer: Self-pay | Admitting: Hematology and Oncology

## 2020-11-07 NOTE — Telephone Encounter (Signed)
Scheduled per sch msg. Called and spoke with patient. Confirmed appts  

## 2020-11-08 ENCOUNTER — Ambulatory Visit: Payer: 59

## 2020-11-09 ENCOUNTER — Ambulatory Visit: Payer: 59

## 2020-11-10 ENCOUNTER — Ambulatory Visit: Payer: 59

## 2020-11-13 ENCOUNTER — Ambulatory Visit: Payer: 59

## 2020-11-14 ENCOUNTER — Ambulatory Visit: Payer: 59

## 2020-11-14 ENCOUNTER — Ambulatory Visit: Payer: 59 | Admitting: Radiation Oncology

## 2020-11-14 ENCOUNTER — Ambulatory Visit: Admission: RE | Admit: 2020-11-14 | Payer: 59 | Source: Ambulatory Visit | Admitting: Radiation Oncology

## 2020-11-14 ENCOUNTER — Telehealth: Payer: Self-pay | Admitting: Hematology and Oncology

## 2020-11-14 ENCOUNTER — Other Ambulatory Visit: Payer: Self-pay | Admitting: Hematology and Oncology

## 2020-11-14 DIAGNOSIS — C50411 Malignant neoplasm of upper-outer quadrant of right female breast: Secondary | ICD-10-CM | POA: Diagnosis not present

## 2020-11-14 DIAGNOSIS — Z171 Estrogen receptor negative status [ER-]: Secondary | ICD-10-CM | POA: Diagnosis present

## 2020-11-14 NOTE — Telephone Encounter (Signed)
Per 10/3 sch msg, labs added to appt, pt confirmed time of new appt

## 2020-11-15 ENCOUNTER — Ambulatory Visit: Payer: 59

## 2020-11-16 ENCOUNTER — Ambulatory Visit: Payer: 59

## 2020-11-17 ENCOUNTER — Ambulatory Visit: Payer: 59

## 2020-11-20 ENCOUNTER — Other Ambulatory Visit: Payer: Self-pay

## 2020-11-20 ENCOUNTER — Ambulatory Visit: Payer: 59

## 2020-11-20 ENCOUNTER — Encounter: Payer: Self-pay | Admitting: *Deleted

## 2020-11-20 ENCOUNTER — Ambulatory Visit: Payer: 59 | Attending: Hematology and Oncology

## 2020-11-20 VITALS — Wt 212.0 lb

## 2020-11-20 DIAGNOSIS — Z483 Aftercare following surgery for neoplasm: Secondary | ICD-10-CM | POA: Insufficient documentation

## 2020-11-20 NOTE — Therapy (Signed)
[facility] [place], [place], [address] Phone: [phone]   Fax:  [phone]  Physical Therapy Treatment  Patient Details  Name: Jacqueline Adams MRN: [id number] Date of Birth: 22-Apr-1960 Referring Provider (PT): Dr. Stark Klein   Encounter Date: 11/20/2020   PT End of Session - 11/20/20 1510     Visit Number 6   # unchanged due to screen only   PT Start Time 1508    PT Stop Time 1514    PT Time Calculation (min) 6 min    Activity Tolerance Patient tolerated treatment well    Behavior During Therapy [facility] for tasks assessed/performed             Past Medical History:  Diagnosis Date   Breast cancer ([place]) 01/2020   right breast IDC   Bronchitis    Cervical spondylarthritis    Headache(784.0)    High cholesterol    Leukocytosis 2009   Multiple sclerosis ([place]) 2009   followed by Dr. Erling Cruz   Neuromuscular disorder [facility])    Pneumonia    Rhinitis    Seasonal allergies    Sinusitis     Past Surgical History:  Procedure Laterality Date   ABDOMINAL HYSTERECTOMY     BREAST LUMPECTOMY WITH RADIOACTIVE SEED AND SENTINEL LYMPH NODE BIOPSY Right 08/08/2020   Procedure: RIGHT BREAST LUMPECTOMY WITH RADIOACTIVE SEED X 3 AND SENTINEL LYMPH NODE BIOPSY;  Surgeon: Stark Klein, MD;  Location: [place];  Service: General;  Laterality: Right;   CESAREAN SECTION     x 3   CORRECTION HAMMER TOE Bilateral    PARTIAL HYSTERECTOMY  2006   for uterine fibroid.    PORT-A-CATH REMOVAL Right 09/21/2020   Procedure: REMOVAL PORT-A-CATH;  Surgeon: Stark Klein, MD;  Location: [facility];  Service: General;  Laterality: Right;   PORTACATH PLACEMENT Right 02/22/2020   Procedure: RIGHT SUBCLAVIAN VEIN PORT PLACEMENT;  Surgeon: Donnie Mesa, MD;  Location: [place];  Service: General;  Laterality: Right;    Vitals:   11/20/20 1510  Weight: 212 lb (96.2 kg)     Subjective Assessment - 11/20/20 1509      Subjective Pt returns for her 3 month L-Dex screen.    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 She underwent neoadjuvant chemotherapy 02/23/2020 - 07/05/2020 followed by a right lumpectomy and sentinel node biopsy (3 negative nodes) on 08/08/2020. She will have radiation simulation on 09/29/2020. She has relapsing remitting multiple sclerosis since 2009.                    L-DEX FLOWSHEETS - 11/20/20 1500       L-DEX LYMPHEDEMA SCREENING   Measurement Type Unilateral    L-DEX MEASUREMENT EXTREMITY Upper Extremity    POSITION  Standing    DOMINANT SIDE Right    At Risk Side Right    BASELINE SCORE (UNILATERAL) -0.3    L-DEX SCORE (UNILATERAL) 2.8    VALUE CHANGE (UNILAT) 3.1                                     PT Long Term Goals - 09/07/20 1052       PT LONG TERM GOAL #1   Title Patient will demonstrate she has regained full shoulder ROM and function post  operatively compared to baselines.    Time 4    Period Weeks    Status On-going      PT LONG TERM GOAL #2   Title Patient will increase right shoulder active flexion to >/= 150 degrees for increased ease reaching overhead.    Baseline 123 post op; 151 pre-op    Time 4    Period Weeks    Status New    Target Date 10/05/20      PT LONG TERM GOAL #3   Title Patient will improve her DASH score to </= 2.27 for improved upper extremity function.    Baseline 18.18 post op; 2.27 pre op    Time 4    Period Weeks    Status New    Target Date 10/05/20      PT LONG TERM GOAL #4   Title Patient will verbalize good understanding of lymhedema precautions.    Time 4    Period Weeks    Status New    Target Date 10/05/20                   Plan - 11/20/20 1512     Clinical Impression Statement Pt returns for her 3 month L-Dex screen. Her change from baseline of 3.1 is WNLs so no further  treatment is required at this time except to cont every 3 month L-Dex screen which pt is agreeable to.    PT Next Visit Plan Cont every 3 month L-Dex screens for up to 2 years from her SLNB (~08/09/22)    Consulted and Agree with Plan of Care Patient             Patient will benefit from skilled therapeutic intervention in order to improve the following deficits and impairments:     Visit Diagnosis: Aftercare following surgery for neoplasm     Problem List Patient Active Problem List   Diagnosis Date Noted   Port-A-Cath in place 04/05/2020   Malignant neoplasm of upper-outer quadrant of right breast in female, estrogen receptor negative ([place]) 01/10/2020   High risk medication use 03/25/2019   Microcytic anemia 12/31/2017   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 UACS 12/16/2017   History of pneumonia 11/12/2017   Morbid obesity ([place]) 08/19/2016   Numbness 05/09/2015   Other fatigue 05/09/2015   Arthralgia of hip 11/16/2014   Low back pain with sciatica 11/16/2014   Urinary frequency 11/16/2014   Encounter for general adult medical examination without abnormal findings 06/07/2014   DOE (dyspnea on exertion) 04/27/2014   Atypical chest pain 04/27/2014   Dyslipidemia 04/27/2014   HLD (hyperlipidemia) 02/14/2014   Cervical spondylosis without myelopathy 07/28/2013   Cervical pain 07/28/2013   CFIDS (chronic fatigue and immune dysfunction syndrome) ([place]) 07/28/2013   Chronic infection of sinus 07/28/2013   Headache, migraine 07/28/2013   DS (disseminated sclerosis) ([place]) 07/28/2013   Spasms of the hands or feet 07/28/2013   DDD (degenerative disc disease), cervical 07/28/2013   Menopausal symptom 07/28/2013   Taking drug for chronic disease 07/28/2013   Vitamin D deficiency 07/28/2013   Degeneration of intervertebral disc of cervical region 07/28/2013   Cough 04/06/2013   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

## 2020-11-21 ENCOUNTER — Ambulatory Visit
Admission: RE | Admit: 2020-11-21 | Discharge: 2020-11-21 | Disposition: A | Discharge status: Home / Self Care | Payer: 59 | Source: Ambulatory Visit | Attending: Radiation Oncology | Admitting: Radiation Oncology

## 2020-11-21 DIAGNOSIS — C50411 Malignant neoplasm of upper-outer quadrant of right female breast: Secondary | ICD-10-CM | POA: Diagnosis not present

## 2020-11-22 ENCOUNTER — Ambulatory Visit
Admission: RE | Admit: 2020-11-22 | Discharge: 2020-11-22 | Disposition: A | Discharge status: Home / Self Care | Payer: 59 | Source: Ambulatory Visit | Attending: Radiation Oncology | Admitting: Radiation Oncology

## 2020-11-22 ENCOUNTER — Other Ambulatory Visit: Payer: Self-pay

## 2020-11-22 DIAGNOSIS — C50411 Malignant neoplasm of upper-outer quadrant of right female breast: Secondary | ICD-10-CM | POA: Diagnosis not present

## 2020-11-23 ENCOUNTER — Ambulatory Visit
Admission: RE | Admit: 2020-11-23 | Discharge: 2020-11-23 | Disposition: A | Discharge status: Home / Self Care | Payer: 59 | Source: Ambulatory Visit | Attending: Radiation Oncology | Admitting: Radiation Oncology

## 2020-11-23 DIAGNOSIS — C50411 Malignant neoplasm of upper-outer quadrant of right female breast: Secondary | ICD-10-CM | POA: Diagnosis not present

## 2020-11-23 NOTE — Progress Notes (Signed)
Pt here for patient teaching.  Pt given Radiation and You booklet, skin care instructions, [place] deodorant, and Radiaplex gel.  Reviewed areas of pertinence such as fatigue, hair loss, skin changes, breast tenderness, and breast swelling . Pt able to give teach back of to pat skin and use unscented/gentle soap,apply Radiaplex bid, avoid applying anything to skin within 4 hours of treatment, avoid wearing an under wire bra, and to use an electric razor if they must shave. Pt verbalizes understanding of information given and will contact nursing with any questions or concerns.     [email or web address].org/treatmentinformation/[email or web address]  Mattalynn Crandle M. Lewis Keats RN, BSN       

## 2020-11-24 ENCOUNTER — Other Ambulatory Visit: Payer: Self-pay

## 2020-11-24 ENCOUNTER — Ambulatory Visit: Payer: 59 | Admitting: Radiation Oncology

## 2020-11-24 ENCOUNTER — Ambulatory Visit
Admission: RE | Admit: 2020-11-24 | Discharge: 2020-11-24 | Disposition: A | Discharge status: Home / Self Care | Payer: 59 | Source: Ambulatory Visit | Attending: Radiation Oncology | Admitting: Radiation Oncology

## 2020-11-24 DIAGNOSIS — C50411 Malignant neoplasm of upper-outer quadrant of right female breast: Secondary | ICD-10-CM | POA: Diagnosis not present

## 2020-11-24 MED ORDER — RADIAPLEXRX EX GEL: Freq: Once | CUTANEOUS | Status: AC

## 2020-11-24 MED ORDER — ALRA NON-METALLIC DEODORANT (RAD-ONC)
1.0000 "application " | Freq: Once | TOPICAL | Status: AC
  Administered 2020-11-24: 1 via TOPICAL

## 2020-11-27 ENCOUNTER — Ambulatory Visit
Admission: RE | Admit: 2020-11-27 | Discharge: 2020-11-27 | Disposition: A | Discharge status: Home / Self Care | Payer: 59 | Source: Ambulatory Visit | Attending: Radiation Oncology | Admitting: Radiation Oncology

## 2020-11-27 ENCOUNTER — Other Ambulatory Visit: Payer: Self-pay

## 2020-11-27 DIAGNOSIS — C50411 Malignant neoplasm of upper-outer quadrant of right female breast: Secondary | ICD-10-CM | POA: Diagnosis not present

## 2020-11-28 ENCOUNTER — Ambulatory Visit
Admission: RE | Admit: 2020-11-28 | Discharge: 2020-11-28 | Disposition: A | Discharge status: Home / Self Care | Payer: 59 | Source: Ambulatory Visit | Attending: Radiation Oncology | Admitting: Radiation Oncology

## 2020-11-28 DIAGNOSIS — C50411 Malignant neoplasm of upper-outer quadrant of right female breast: Secondary | ICD-10-CM | POA: Diagnosis not present

## 2020-11-29 ENCOUNTER — Other Ambulatory Visit: Payer: Self-pay

## 2020-11-29 ENCOUNTER — Ambulatory Visit
Admission: RE | Admit: 2020-11-29 | Discharge: 2020-11-29 | Disposition: A | Discharge status: Home / Self Care | Payer: 59 | Source: Ambulatory Visit | Attending: Radiation Oncology | Admitting: Radiation Oncology

## 2020-11-29 ENCOUNTER — Ambulatory Visit: Payer: 59

## 2020-11-29 DIAGNOSIS — C50411 Malignant neoplasm of upper-outer quadrant of right female breast: Secondary | ICD-10-CM | POA: Diagnosis not present

## 2020-11-30 ENCOUNTER — Ambulatory Visit
Admission: RE | Admit: 2020-11-30 | Discharge: 2020-11-30 | Disposition: A | Discharge status: Home / Self Care | Payer: 59 | Source: Ambulatory Visit | Attending: Radiation Oncology | Admitting: Radiation Oncology

## 2020-11-30 ENCOUNTER — Ambulatory Visit: Payer: 59

## 2020-11-30 DIAGNOSIS — C50411 Malignant neoplasm of upper-outer quadrant of right female breast: Secondary | ICD-10-CM | POA: Diagnosis not present

## 2020-12-01 ENCOUNTER — Ambulatory Visit: Payer: 59

## 2020-12-01 ENCOUNTER — Other Ambulatory Visit: Payer: Self-pay

## 2020-12-01 ENCOUNTER — Ambulatory Visit: Payer: 59 | Admitting: Radiation Oncology

## 2020-12-01 ENCOUNTER — Ambulatory Visit
Admission: RE | Admit: 2020-12-01 | Discharge: 2020-12-01 | Disposition: A | Discharge status: Home / Self Care | Payer: 59 | Source: Ambulatory Visit | Attending: Radiation Oncology | Admitting: Radiation Oncology

## 2020-12-01 DIAGNOSIS — C50411 Malignant neoplasm of upper-outer quadrant of right female breast: Secondary | ICD-10-CM | POA: Diagnosis not present

## 2020-12-04 ENCOUNTER — Ambulatory Visit
Admission: RE | Admit: 2020-12-04 | Discharge: 2020-12-04 | Disposition: A | Discharge status: Home / Self Care | Payer: 59 | Source: Ambulatory Visit | Attending: Radiation Oncology | Admitting: Radiation Oncology

## 2020-12-04 ENCOUNTER — Ambulatory Visit: Payer: 59

## 2020-12-04 ENCOUNTER — Other Ambulatory Visit: Payer: Self-pay

## 2020-12-04 DIAGNOSIS — C50411 Malignant neoplasm of upper-outer quadrant of right female breast: Secondary | ICD-10-CM | POA: Diagnosis not present

## 2020-12-05 ENCOUNTER — Other Ambulatory Visit: Payer: Self-pay

## 2020-12-05 ENCOUNTER — Ambulatory Visit
Admission: RE | Admit: 2020-12-05 | Discharge: 2020-12-05 | Disposition: A | Discharge status: Home / Self Care | Payer: 59 | Source: Ambulatory Visit | Attending: Radiation Oncology | Admitting: Radiation Oncology

## 2020-12-05 DIAGNOSIS — C50411 Malignant neoplasm of upper-outer quadrant of right female breast: Secondary | ICD-10-CM | POA: Diagnosis not present

## 2020-12-06 ENCOUNTER — Ambulatory Visit
Admission: RE | Admit: 2020-12-06 | Discharge: 2020-12-06 | Disposition: A | Discharge status: Home / Self Care | Payer: 59 | Source: Ambulatory Visit | Attending: Radiation Oncology | Admitting: Radiation Oncology

## 2020-12-06 DIAGNOSIS — C50411 Malignant neoplasm of upper-outer quadrant of right female breast: Secondary | ICD-10-CM | POA: Diagnosis not present

## 2020-12-07 ENCOUNTER — Other Ambulatory Visit: Payer: Self-pay

## 2020-12-07 ENCOUNTER — Ambulatory Visit
Admission: RE | Admit: 2020-12-07 | Discharge: 2020-12-07 | Disposition: A | Discharge status: Home / Self Care | Payer: 59 | Source: Ambulatory Visit | Attending: Radiation Oncology | Admitting: Radiation Oncology

## 2020-12-07 DIAGNOSIS — C50411 Malignant neoplasm of upper-outer quadrant of right female breast: Secondary | ICD-10-CM | POA: Diagnosis not present

## 2020-12-08 ENCOUNTER — Ambulatory Visit
Admission: RE | Admit: 2020-12-08 | Discharge: 2020-12-08 | Disposition: A | Discharge status: Home / Self Care | Payer: 59 | Source: Ambulatory Visit | Attending: Radiation Oncology | Admitting: Radiation Oncology

## 2020-12-08 DIAGNOSIS — C50411 Malignant neoplasm of upper-outer quadrant of right female breast: Secondary | ICD-10-CM | POA: Diagnosis not present

## 2020-12-11 ENCOUNTER — Ambulatory Visit
Admission: RE | Admit: 2020-12-11 | Discharge: 2020-12-11 | Disposition: A | Discharge status: Home / Self Care | Payer: 59 | Source: Ambulatory Visit | Attending: Radiation Oncology | Admitting: Radiation Oncology

## 2020-12-11 ENCOUNTER — Ambulatory Visit: Payer: 59

## 2020-12-11 ENCOUNTER — Other Ambulatory Visit: Payer: Self-pay

## 2020-12-11 ENCOUNTER — Ambulatory Visit: Payer: 59 | Admitting: Radiation Oncology

## 2020-12-11 DIAGNOSIS — C50411 Malignant neoplasm of upper-outer quadrant of right female breast: Secondary | ICD-10-CM | POA: Diagnosis not present

## 2020-12-12 ENCOUNTER — Ambulatory Visit
Admission: RE | Admit: 2020-12-12 | Discharge: 2020-12-12 | Disposition: A | Discharge status: Home / Self Care | Payer: 59 | Source: Ambulatory Visit | Attending: Radiation Oncology | Admitting: Radiation Oncology

## 2020-12-12 DIAGNOSIS — Z171 Estrogen receptor negative status [ER-]: Secondary | ICD-10-CM | POA: Diagnosis not present

## 2020-12-12 DIAGNOSIS — Z8041 Family history of malignant neoplasm of ovary: Secondary | ICD-10-CM | POA: Diagnosis not present

## 2020-12-12 DIAGNOSIS — Z148 Genetic carrier of other disease: Secondary | ICD-10-CM | POA: Diagnosis present

## 2020-12-12 DIAGNOSIS — G709 Myoneural disorder, unspecified: Secondary | ICD-10-CM | POA: Diagnosis not present

## 2020-12-12 DIAGNOSIS — G35 Multiple sclerosis: Secondary | ICD-10-CM | POA: Diagnosis not present

## 2020-12-12 DIAGNOSIS — Z1502 Genetic susceptibility to malignant neoplasm of ovary: Secondary | ICD-10-CM | POA: Diagnosis present

## 2020-12-12 DIAGNOSIS — E7800 Pure hypercholesterolemia, unspecified: Secondary | ICD-10-CM | POA: Diagnosis not present

## 2020-12-12 DIAGNOSIS — Z807 Family history of other malignant neoplasms of lymphoid, hematopoietic and related tissues: Secondary | ICD-10-CM | POA: Diagnosis not present

## 2020-12-12 DIAGNOSIS — Z1509 Genetic susceptibility to other malignant neoplasm: Secondary | ICD-10-CM | POA: Diagnosis not present

## 2020-12-12 DIAGNOSIS — C50411 Malignant neoplasm of upper-outer quadrant of right female breast: Secondary | ICD-10-CM | POA: Diagnosis present

## 2020-12-12 DIAGNOSIS — Z1501 Genetic susceptibility to malignant neoplasm of breast: Secondary | ICD-10-CM | POA: Diagnosis not present

## 2020-12-12 DIAGNOSIS — Z803 Family history of malignant neoplasm of breast: Secondary | ICD-10-CM | POA: Diagnosis not present

## 2020-12-12 DIAGNOSIS — Z90710 Acquired absence of both cervix and uterus: Secondary | ICD-10-CM | POA: Diagnosis not present

## 2020-12-12 NOTE — Progress Notes (Signed)
Patient Care Team: Robyne Peers, MD as PCP - General (Family Medicine) Love, Alyson Locket, MD (Neurology) Jerrell Belfast, MD Mauro Kaufmann, RN as Oncology Nurse Navigator Rockwell Germany, RN as Oncology Nurse Navigator Donnie Mesa, MD as Consulting Physician (General Surgery) Nicholas Lose, MD as Consulting Physician (Hematology and Oncology) Kyung Rudd, MD as Consulting Physician (Radiation Oncology)  DIAGNOSIS:    ICD-10-CM   1. Malignant neoplasm of upper-outer quadrant of right breast in female, estrogen receptor negative ([place])  C50.411    Z17.1       SUMMARY OF ONCOLOGIC HISTORY: Oncology History  Malignant neoplasm of upper-outer quadrant of right breast in female, estrogen receptor negative ([place])  01/10/2020 Initial Diagnosis   Palpable right breast mass. Mammogram showed a 1.8cm mass at the 10:30 position 2cm from the nipple, a 1.5cm mass at the 10:30 position 7cm from the nipple, and indeterminate calcifications between the two masses in the right breast. 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    01/12/2020 Cancer Staging   Staging form: Breast, AJCC 8th Edition - Clinical stage from 01/12/2020: Stage IB (cT1c, cN0, cM0, G3, ER-, PR-, HER2-) - Signed by Nicholas Lose, MD on 02/03/2020    02/23/2020 -  Chemotherapy    Patient is on Treatment Plan: BREAST DOSE DENSE AC Q14D / CARBOPLATIN D1 + PACLITAXEL D1,8,15 Q21D       08/08/2020 Surgery   Right breast lumpectomy: scattered foci of DCIS with calcifications, intermediate to high-grade, largest measuring 0.3 cm, no residual invasive carcinoma, resection margins negative for DCIS, and all 3 axillary nodes negative for carcinoma     CHIEF COMPLIANT: Follow-up of BRCA2 mutation and right breast cancer   INTERVAL HISTORY: Jacqueline Adams is a 60 y.o. with above-mentioned history of BRCA2 mutation and right breast cancer having on  neoadjuvant chemotherapy with weekly Taxol and Carboplatin every 3 weeks and radiation therapy. She presents to the clinic today for follow-up.  Other than fatigue she has tolerated radiation fairly well.  ALLERGIES:  is allergic to procaine, shellfish allergy, and relafen [nabumetone].  MEDICATIONS:  Current Outpatient Medications  Medication Sig Dispense Refill   Calcium Carb-Cholecalciferol (CALCIUM 600+D3 PO) Take 1 tablet by mouth in the morning and at bedtime.     Cholecalciferol (VITAMIN D3) 25 MCG (1000 UT) CAPS Take 1,000 Units by mouth in the morning.     Ferrous Sulfate Dried (SLOW RELEASE IRON) 45 MG TBCR Take 45 mg by mouth in the morning.     fexofenadine (ALLEGRA) 180 MG tablet Take 180 mg by mouth daily as needed for allergies.     fluticasone (FLONASE) 50 MCG/ACT nasal spray Place 2 sprays into both nostrils daily. (Patient taking differently: Place 2 sprays into both nostrils daily as needed for allergies.) 16 g 2   ibuprofen (ADVIL) 200 MG tablet Take 400 mg by mouth every Friday. W/Avonex injection     ibuprofen (ADVIL) 200 MG tablet Take 400 mg by mouth every 6 (six) hours as needed for headache or mild pain.     interferon beta-1a (AVONEX PREFILLED) 30 MCG/0.5ML PSKT injection INJECT ONE SYRINGE INTRAMUSCULARLY ONCE WEEKLY. REFRIGERATE. PROTECT FROM LIGHT. ALLOW TO WARM TO ROOM TEMPERATURE PRIOR TO USE. (Patient taking differently: Inject 30 mcg into the muscle every Friday. INJECT ONE SYRINGE INTRAMUSCULARLY ONCE WEEKLY. REFRIGERATE. PROTECT FROM LIGHT. ALLOW TO WARM TO ROOM TEMPERATURE PRIOR TO USE.) 3 each 3  KLOR-CON M20 20 MEQ tablet TAKE 1 TABLET BY MOUTH IN THE MORNING. 30 tablet 1   meclizine (ANTIVERT) 25 MG tablet Take 1 tablet (25 mg total) by mouth 3 (three) times daily as needed for dizziness. 30 tablet 0   metoprolol succinate (TOPROL-XL) 25 MG 24 hr tablet Take 25 mg by mouth daily.     Multiple Vitamin (MULTIVITAMIN WITH MINERALS) TABS tablet Take 1 tablet by  mouth every evening. Centrum     Omega-3 Fatty Acids (FISH OIL) 1000 MG CPDR Take 1,000 mg by mouth daily.     prochlorperazine (COMPAZINE) 10 MG tablet Take 1 tablet (10 mg total) by mouth every 6 (six) hours as needed (Nausea or vomiting). 30 tablet 1   simvastatin (ZOCOR) 20 MG tablet Take 20 mg by mouth every evening.     VENTOLIN HFA 108 (90 BASE) MCG/ACT inhaler Inhale 1-2 puffs into the lungs every 6 (six) hours as needed for shortness of breath or wheezing.     No current facility-administered medications for this visit.    PHYSICAL EXAMINATION: ECOG PERFORMANCE STATUS: 1 - Symptomatic but completely ambulatory  There were no vitals filed for this visit. There were no vitals filed for this visit.  BREAST: No palpable masses or nodules in either right or left breasts. No palpable axillary supraclavicular or infraclavicular adenopathy no breast tenderness or nipple discharge. (exam performed in the presence of a chaperone)  LABORATORY DATA:  I have reviewed the data as listed CMP Latest Ref Rng & Units 09/21/2020 08/09/2020 08/02/2020  Glucose 70 - 99 mg/dL 108(H) 164(H) 114(H)  BUN 6 - 20 mg/dL <5(L) 8 7  Creatinine 0.44 - 1.00 mg/dL 0.60 0.74 0.59  Sodium 135 - 145 mmol/L 140 138 139  Potassium 3.5 - 5.1 mmol/L 3.5 3.8 3.6  Chloride 98 - 111 mmol/L 106 105 107  CO2 22 - 32 mmol/L 25 25 24   Calcium 8.9 - 10.3 mg/dL 8.9 8.9 8.9  Total Protein 6.5 - 8.1 g/dL - - -  Total Bilirubin 0.3 - 1.2 mg/dL - - -  Alkaline Phos 38 - 126 U/L - - -  AST 15 - 41 U/L - - -  ALT 0 - 44 U/L - - -    Lab Results  Component Value Date   WBC 8.7 12/13/2020   HGB 12.0 12/13/2020   HCT 35.9 (L) 12/13/2020   MCV 74.6 (L) 12/13/2020   PLT 244 12/13/2020   NEUTROABS 6.1 12/13/2020    ASSESSMENT & PLAN:  Malignant neoplasm of upper-outer quadrant of right breast in female, estrogen receptor negative ([place]) 01/10/2020:Palpable right breast mass. Mammogram showed a 1.8cm mass at the 10:30  position 2cm from the nipple, a 1.5cm mass at the 10:30 position 7cm from the nipple, and indeterminate calcifications between the two masses in the right breast. 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  BRCA2 Mutation   Treatment plan: 1. Neoadjuvant chemotherapy with Adriamycin and Cytoxan every 2 weeks 4 followed by Taxol weekly 12 with carboplatin and  every 3 weeks x4 completed 07/05/2020 2. 08/08/20 breast conserving surgery with sentinel lymph node study : scattered foci of DCIS, margins Neg, 0/3 LN Neg 3. Adjuvant radiation therapy 11/22/20- 12/18/20 4. Adjuvant olaparib URCC nausea study   Breast MRI 01/14/2020: 2 cm spiculated mass UOQ right breast, 2.1 cm spiculated mass UOQ right breast, biopsy-proven malignancies.  7 mm suspicious enhancing mass: Biopsy 02/02/2020:  High-grade DCIS ----------------------------------------------------------------------------------------------------------------------------------------------- Olaparib: Lonie Peak is a PARP inhibitor which has been approved for adj therapy of BRCA2 mutation [place] trial showed that the recurrence rate improves with olaparib.  86% versus 77%. Dose: 300 mg twice daily  We may start at a lower dosage and titrate upwards. Adverse effects counseling: Olaparib can cause gastrointestinal toxicity, cytopenias, fatigue, generalized weakness etc. She may have foot surgery coming up and therefore we will hold off on olaparib at this time. Referral to GYN oncology for prophylactic oophorectomy.  MyChart virtual visit in January to discuss starting olaparib.    No orders of the defined types were placed in this encounter.  The patient has a good understanding of the overall plan. she agrees with it. she will call with any problems that may develop before the next visit here.  Total time spent: 20 mins including face to face time and time spent for  planning, charting and coordination of care  Rulon Eisenmenger, MD, MPH 12/13/2020  I, Thana Ates, am acting as scribe for Dr. Nicholas Lose.  I have reviewed the above documentation for accuracy and completeness, and I agree with the above.

## 2020-12-12 NOTE — Assessment & Plan Note (Signed)
01/10/2020:Palpable right breast mass. Mammogram showed a 1.8cm mass at the 10:30 position 2cm from the nipple, a 1.5cm mass at the 10:30 position 7cm from the nipple, and indeterminate calcifications between the two masses in the right breast. 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 BRCA2Mutation  Treatment plan: 1. Neoadjuvant chemotherapy with Adriamycin and Cytoxan every2weeks4 followed by Taxol weekly 12 with carboplatinand every 3 weeks x4 completed 07/05/2020 2. 08/08/20 breast conserving surgery with sentinel lymph node study : scattered foci of DCIS, margins Neg, 0/3 LN Neg 3. Adjuvant radiation therapy 11/22/20- 12/18/20 4.Adjuvant olaparib URCC nausea study  Breast MRI12/04/2019: 2 cm spiculated mass UOQ right breast, 2.1 cm spiculated mass UOQ right breast, biopsy-proven malignancies. 7 mm suspicious enhancing mass: Biopsy 02/02/2020: High-grade DCIS ----------------------------------------------------------------------------------------------------------------------------------------------- Olaparib: Jacqueline Adams is a PARP inhibitor which has been approved for adj therapy of BRCA2 mutation  Dose: 300 mg twice daily  Adverse effects counseling: Olaparib can cause gastrointestinal toxicity, cytopenias, fatigue, generalized weakness etc.  RTC in 3 weeks for follow up with Gilford Rile our pharmacy practitioner

## 2020-12-13 ENCOUNTER — Inpatient Hospital Stay: Payer: 59 | Attending: Hematology and Oncology | Admitting: Hematology and Oncology

## 2020-12-13 ENCOUNTER — Ambulatory Visit
Admission: RE | Admit: 2020-12-13 | Discharge: 2020-12-13 | Disposition: A | Discharge status: Home / Self Care | Payer: 59 | Source: Ambulatory Visit | Attending: Radiation Oncology | Admitting: Radiation Oncology

## 2020-12-13 ENCOUNTER — Inpatient Hospital Stay: Payer: 59

## 2020-12-13 ENCOUNTER — Other Ambulatory Visit: Payer: Self-pay

## 2020-12-13 DIAGNOSIS — C50411 Malignant neoplasm of upper-outer quadrant of right female breast: Secondary | ICD-10-CM

## 2020-12-13 DIAGNOSIS — Z148 Genetic carrier of other disease: Secondary | ICD-10-CM | POA: Diagnosis not present

## 2020-12-13 DIAGNOSIS — Z171 Estrogen receptor negative status [ER-]: Secondary | ICD-10-CM | POA: Insufficient documentation

## 2020-12-13 DIAGNOSIS — Z807 Family history of other malignant neoplasms of lymphoid, hematopoietic and related tissues: Secondary | ICD-10-CM | POA: Insufficient documentation

## 2020-12-13 DIAGNOSIS — Z8041 Family history of malignant neoplasm of ovary: Secondary | ICD-10-CM | POA: Insufficient documentation

## 2020-12-13 DIAGNOSIS — Z1509 Genetic susceptibility to other malignant neoplasm: Secondary | ICD-10-CM | POA: Insufficient documentation

## 2020-12-13 DIAGNOSIS — Z90710 Acquired absence of both cervix and uterus: Secondary | ICD-10-CM | POA: Insufficient documentation

## 2020-12-13 DIAGNOSIS — Z803 Family history of malignant neoplasm of breast: Secondary | ICD-10-CM | POA: Insufficient documentation

## 2020-12-13 DIAGNOSIS — G35 Multiple sclerosis: Secondary | ICD-10-CM | POA: Insufficient documentation

## 2020-12-13 DIAGNOSIS — G709 Myoneural disorder, unspecified: Secondary | ICD-10-CM | POA: Insufficient documentation

## 2020-12-13 DIAGNOSIS — Z1501 Genetic susceptibility to malignant neoplasm of breast: Secondary | ICD-10-CM | POA: Insufficient documentation

## 2020-12-13 DIAGNOSIS — Z1502 Genetic susceptibility to malignant neoplasm of ovary: Secondary | ICD-10-CM | POA: Insufficient documentation

## 2020-12-13 DIAGNOSIS — E7800 Pure hypercholesterolemia, unspecified: Secondary | ICD-10-CM | POA: Insufficient documentation

## 2020-12-13 LAB — CBC WITH DIFFERENTIAL (CANCER CENTER ONLY)
Abs Immature Granulocytes: 0.03 10*3/uL (ref 0.00–0.07)
Basophils Absolute: 0 10*3/uL (ref 0.0–0.1)
Basophils Relative: 1 %
Eosinophils Absolute: 0.2 10*3/uL (ref 0.0–0.5)
Eosinophils Relative: 2 %
HCT: 35.9 % — ABNORMAL LOW (ref 36.0–46.0)
Hemoglobin: 12 g/dL (ref 12.0–15.0)
Immature Granulocytes: 0 %
Lymphocytes Relative: 20 %
Lymphs Abs: 1.8 10*3/uL (ref 0.7–4.0)
MCH: 24.9 pg — ABNORMAL LOW (ref 26.0–34.0)
MCHC: 33.4 g/dL (ref 30.0–36.0)
MCV: 74.6 fL — ABNORMAL LOW (ref 80.0–100.0)
Monocytes Absolute: 0.6 10*3/uL (ref 0.1–1.0)
Monocytes Relative: 7 %
Neutro Abs: 6.1 10*3/uL (ref 1.7–7.7)
Neutrophils Relative %: 70 %
Platelet Count: 244 10*3/uL (ref 150–400)
RBC: 4.81 MIL/uL (ref 3.87–5.11)
RDW: 17.5 % — ABNORMAL HIGH (ref 11.5–15.5)
WBC Count: 8.7 10*3/uL (ref 4.0–10.5)
nRBC: 0 % (ref 0.0–0.2)

## 2020-12-13 LAB — CMP (CANCER CENTER ONLY)
ALT: 20 U/L (ref 0–44)
AST: 24 U/L (ref 15–41)
Albumin: 3.6 g/dL (ref 3.5–5.0)
Alkaline Phosphatase: 91 U/L (ref 38–126)
Anion gap: 10 (ref 5–15)
BUN: 8 mg/dL (ref 6–20)
CO2: 25 mmol/L (ref 22–32)
Calcium: 9 mg/dL (ref 8.9–10.3)
Chloride: 107 mmol/L (ref 98–111)
Creatinine: 0.67 mg/dL (ref 0.44–1.00)
GFR, Estimated: >60 mL/min (ref >60)
Glucose, Bld: 109 mg/dL — ABNORMAL HIGH (ref 70–99)
Potassium: 3.7 mmol/L (ref 3.5–5.1)
Sodium: 142 mmol/L (ref 135–145)
Total Bilirubin: 0.5 mg/dL (ref 0.3–1.2)
Total Protein: 7.3 g/dL (ref 6.5–8.1)

## 2020-12-14 ENCOUNTER — Ambulatory Visit
Admission: RE | Admit: 2020-12-14 | Discharge: 2020-12-14 | Disposition: A | Discharge status: Home / Self Care | Payer: 59 | Source: Ambulatory Visit | Attending: Radiation Oncology | Admitting: Radiation Oncology

## 2020-12-14 ENCOUNTER — Other Ambulatory Visit: Payer: Self-pay | Admitting: Hematology and Oncology

## 2020-12-14 DIAGNOSIS — Z148 Genetic carrier of other disease: Secondary | ICD-10-CM | POA: Diagnosis not present

## 2020-12-15 ENCOUNTER — Ambulatory Visit
Admission: RE | Admit: 2020-12-15 | Discharge: 2020-12-15 | Disposition: A | Discharge status: Home / Self Care | Payer: 59 | Source: Ambulatory Visit | Attending: Radiation Oncology | Admitting: Radiation Oncology

## 2020-12-15 ENCOUNTER — Other Ambulatory Visit: Payer: Self-pay

## 2020-12-15 DIAGNOSIS — Z148 Genetic carrier of other disease: Secondary | ICD-10-CM | POA: Diagnosis not present

## 2020-12-18 ENCOUNTER — Encounter: Payer: Self-pay | Admitting: *Deleted

## 2020-12-18 ENCOUNTER — Ambulatory Visit
Admission: RE | Admit: 2020-12-18 | Discharge: 2020-12-18 | Disposition: A | Discharge status: Home / Self Care | Payer: 59 | Source: Ambulatory Visit | Attending: Radiation Oncology | Admitting: Radiation Oncology

## 2020-12-18 ENCOUNTER — Encounter: Payer: Self-pay | Admitting: Radiation Oncology

## 2020-12-18 ENCOUNTER — Other Ambulatory Visit: Payer: Self-pay

## 2020-12-18 DIAGNOSIS — Z171 Estrogen receptor negative status [ER-]: Secondary | ICD-10-CM

## 2020-12-18 DIAGNOSIS — Z148 Genetic carrier of other disease: Secondary | ICD-10-CM | POA: Diagnosis not present

## 2020-12-19 ENCOUNTER — Telehealth: Payer: Self-pay | Admitting: *Deleted

## 2020-12-21 ENCOUNTER — Encounter: Payer: Self-pay | Admitting: Hematology and Oncology

## 2020-12-21 NOTE — Telephone Encounter (Signed)
Spoke with the patient and scheduled a new patient appt with Dr Berline Lopes on 11/21 at 9 am; patient given an time of 8:30 am. Patient aware of the address and phone for the clinic

## 2020-12-22 ENCOUNTER — Encounter: Payer: Self-pay | Admitting: Hematology and Oncology

## 2020-12-22 NOTE — Progress Notes (Signed)
                                                                                                                                                             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 She developed  anticipated skin changes in the treatment field. Her skin was intact at the conclusion of therapy.  Plan: The patient will receive a call in about one month from the radiation oncology department. She will continue follow up with Dr. [name] as well.   ________________________________________________    Carola Rhine, [facility]

## 2021-01-01 ENCOUNTER — Inpatient Hospital Stay (HOSPITAL_BASED_OUTPATIENT_CLINIC_OR_DEPARTMENT_OTHER): Payer: 59 | Admitting: Gynecologic Oncology

## 2021-01-01 ENCOUNTER — Encounter: Payer: Self-pay | Admitting: Gynecologic Oncology

## 2021-01-01 ENCOUNTER — Other Ambulatory Visit: Payer: Self-pay

## 2021-01-01 VITALS — BP 127/76 | HR 88 | Temp 98.8°F | Resp 16 | Ht 64.0 in | Wt 211.4 lb

## 2021-01-01 DIAGNOSIS — Z1502 Genetic susceptibility to malignant neoplasm of ovary: Secondary | ICD-10-CM | POA: Diagnosis not present

## 2021-01-01 DIAGNOSIS — C50411 Malignant neoplasm of upper-outer quadrant of right female breast: Secondary | ICD-10-CM | POA: Diagnosis not present

## 2021-01-01 DIAGNOSIS — Z148 Genetic carrier of other disease: Secondary | ICD-10-CM

## 2021-01-01 DIAGNOSIS — Z1501 Genetic susceptibility to malignant neoplasm of breast: Secondary | ICD-10-CM

## 2021-01-01 DIAGNOSIS — Z1509 Genetic susceptibility to other malignant neoplasm: Secondary | ICD-10-CM

## 2021-01-01 DIAGNOSIS — Z01818 Encounter for other preprocedural examination: Secondary | ICD-10-CM

## 2021-01-01 MED ORDER — SENNOSIDES-DOCUSATE SODIUM 8.6-50 MG PO TABS: 2.0000 | ORAL_TABLET | Freq: Every day | ORAL | 0 refills | Status: DC

## 2021-01-01 MED ORDER — IBUPROFEN 800 MG PO TABS: 800.0000 mg | ORAL_TABLET | Freq: Three times a day (TID) | ORAL | 0 refills | Status: DC | PRN

## 2021-01-01 MED ORDER — TRAMADOL HCL 50 MG PO TABS: 50.0000 mg | ORAL_TABLET | Freq: Four times a day (QID) | ORAL | 0 refills | Status: DC | PRN

## 2021-01-01 NOTE — Progress Notes (Signed)
Patient here for consultation with Dr. Jeral Pinch and for a pre-operative discussion prior to her scheduled surgery on January 18, 2021. She is scheduled for robotic assisted laparoscopic bilateral oophorectomy, resection of any residual fallopian tubes, possible laparotomy, possible staging. See after visit summary for additional details. Visual aids used to discuss items related to surgery including sequential compression stockings, foley catheter, IV pump, multi-modal pain regimen including tylenol, photo of the surgical robot, female reproductive system to discuss surgery in detail.      Discussed post-op pain management in detail including the aspects of the enhanced recovery pathway.  Advised her that a new prescription would be sent in for tramadol and it is only to be used for after her upcoming surgery.  We discussed the use of tylenol post-op and to monitor for a maximum of 4,000 mg in a 24 hour period.  Also prescribed sennakot to be used after surgery and to hold if having loose stools.  Discussed bowel regimen in detail.     Discussed the use of SCDs and measures to take at home to prevent DVT including frequent mobility.  Reportable signs and symptoms of DVT discussed. Post-operative instructions discussed and expectations for after surgery. Incisional care discussed as well including reportable signs and symptoms including erythema, drainage, wound separation.     5 minutes spent with the patient.  Verbalizing understanding of material discussed. No needs or concerns voiced at the end of the visit. Advised patient to call for any needs.  Advised that her post-operative medications had been prescribed and could be picked up at any time.    Our office will also reach out to her neurologist, Dr. Felecia Shelling for recommendations regarding interferon use pre-op and post-op.  This appointment is included in the global surgical bundle as pre-operative teaching and has no charge.

## 2021-01-01 NOTE — Patient Instructions (Signed)
Preparing for your Surgery   Plan for surgery on January 18, 2021 with Dr. Jeral Pinch at [facility] will be scheduled for robotic assisted laparoscopic bilateral oophorectomy (removal of both ovaries), resection of any residual fallopian tubes, possible laparotomy (larger incision on your abdomen if needed), possible staging (if a cancer is identified).    We will reach out to Dr. Felecia Shelling for recommendations regarding your interferon before and after surgery.   Pre-operative Testing -You will receive a phone call from presurgical testing at [facility] to arrange for a pre-operative appointment and lab work.   -Bring your insurance card, copy of an advanced directive if applicable, medication list   -At that visit, you will be asked to sign a consent for a possible blood transfusion in case a transfusion becomes necessary during surgery.  The need for a blood transfusion is rare but having consent is a necessary part of your care.      -You should not be taking blood thinners or aspirin at least ten days prior to surgery unless instructed by your surgeon.   -Do not take supplements such as fish oil (omega 3), red yeast rice, turmeric before your surgery. You want to avoid medications with aspirin in them including headache powders such as BC or Goody's), Excedrin migraine. You are fine to take tylenol as needed.   Day Before Surgery at [place] will be asked to take in a light diet the day before surgery. You will be advised you can have clear liquids up until 3 hours before your surgery.     Eat a light diet the day before surgery.  Examples including soups, broths, toast, yogurt, mashed potatoes.  AVOID GAS PRODUCING FOODS. Things to avoid include carbonated beverages (fizzy beverages, sodas), raw fruits and raw vegetables (uncooked), or beans.    If your bowels are filled with gas, your surgeon will have difficulty visualizing your pelvic organs which increases  your surgical risks.   Your role in recovery Your role is to become active as soon as directed by your doctor, while still giving yourself time to heal.  Rest when you feel tired. You will be asked to do the following in order to speed your recovery:   - Cough and breathe deeply. This helps to clear and expand your lungs and can prevent pneumonia after surgery.  - [place]. Do mild physical activity. Walking or moving your legs help your circulation and body functions return to normal. Do not try to get up or walk alone the first time after surgery.   -If you develop swelling on one leg or the other, pain in the back of your leg, redness/warmth in one of your legs, please call the office or go to the Emergency Room to have a doppler to rule out a blood clot. For shortness of breath, chest pain-seek care in the Emergency Room as soon as possible. - Actively manage your pain. Managing your pain lets you move in comfort. We will ask you to rate your pain on a scale of zero to 10. It is your responsibility to tell your doctor or nurse where and how much you hurt so your pain can be treated.   Special Considerations -If you are diabetic, you may be placed on insulin after surgery to have closer control over your blood sugars to promote healing and recovery.  This does not mean that you will be discharged on insulin.  If applicable,  your oral antidiabetics will be resumed when you are tolerating a solid diet.   -Your final pathology results from surgery should be available around one week after surgery and the results will be relayed to you when available.   -Dr. Lahoma Crocker is the surgeon that assists your GYN Oncologist with surgery.  If you end up staying the night, the next day after your surgery you will either see Dr. Berline Lopes or Dr. Lahoma Crocker.   -FMLA forms can be faxed to [phone] and please allow 5-7 business days for completion.   Pain Management After  Surgery -You have been prescribed your pain medication and bowel regimen medications before surgery so that you can have these available when you are discharged from the hospital. The pain medication is for use ONLY AFTER surgery and a new prescription will not be given.    -Make sure that you have Tylenol and Ibuprofen at home to use on a regular basis after surgery for pain control. We recommend alternating the medications every hour to six hours since they work differently and are processed in the body differently for pain relief.   -Review the attached handout on narcotic use and their risks and side effects.    Bowel Regimen -You have been prescribed Sennakot-S to take nightly to prevent constipation especially if you are taking the narcotic pain medication intermittently.  It is important to prevent constipation and drink adequate amounts of liquids. You can stop taking this medication when you are not taking pain medication and you are back on your normal bowel routine.   Risks of Surgery Risks of surgery are low but include bleeding, infection, damage to surrounding structures, re-operation, blood clots, and very rarely death.     Blood Transfusion Information (For the consent to be signed before surgery)   We will be checking your blood type before surgery so in case of emergencies, we will know what type of blood you would need.                                             WHAT IS A BLOOD TRANSFUSION?   A transfusion is the replacement of blood or some of its parts. Blood is made up of multiple cells which provide different functions. Red blood cells carry oxygen and are used for blood loss replacement. White blood cells fight against infection. Platelets control bleeding. Plasma helps clot blood. Other blood products are available for specialized needs, such as hemophilia or other clotting disorders. BEFORE THE TRANSFUSION  Who gives blood for transfusions?  You may be able to  donate blood to be used at a later date on yourself (autologous donation). Relatives can be asked to donate blood. This is generally not any safer than if you have received blood from a stranger. The same precautions are taken to ensure safety when a relative's blood is donated. Healthy volunteers who are fully evaluated to make sure their blood is safe. This is blood bank blood. Transfusion therapy is the safest it has ever been in the practice of medicine. Before blood is taken from a donor, a complete history is taken to make sure that person has no history of diseases nor engages in risky social behavior (examples are intravenous drug use or sexual activity with multiple partners). The donor's travel history is screened to minimize risk of transmitting infections, such as  malaria. The donated blood is tested for signs of infectious diseases, such as HIV and hepatitis. The blood is then tested to be sure it is compatible with you in order to minimize the chance of a transfusion reaction. If you or a relative donates blood, this is often done in anticipation of surgery and is not appropriate for emergency situations. It takes many days to process the donated blood. RISKS AND COMPLICATIONS Although transfusion therapy is very safe and saves many lives, the main dangers of transfusion include:  Getting an infectious disease. Developing a transfusion reaction. This is an allergic reaction to something in the blood you were given. Every precaution is taken to prevent this. The decision to have a blood transfusion has been considered carefully by your caregiver before blood is given. Blood is not given unless the benefits outweigh the risks.   AFTER SURGERY INSTRUCTIONS   Return to work: 4-6 weeks if applicable   Activity: 1. Be up and out of the bed during the day.  Take a nap if needed.  You may walk up steps but be careful and use the hand rail.  Stair climbing will tire you more than you think, you  may need to stop part way and rest.    2. No lifting or straining for 6 weeks over 10 pounds. No pushing, pulling, straining for 6 weeks.   3. No driving for 1 week(s).  Do not drive if you are taking narcotic pain medicine and make sure that your reaction time has returned.    4. You can shower as soon as the next day after surgery. Shower daily.  Use your regular soap and water (not directly on the incision) and pat your incision(s) dry afterwards; don't rub.  No tub baths or submerging your body in water until cleared by your surgeon. If you have the soap that was given to you by pre-surgical testing that was used before surgery, you do not need to use it afterwards because this can irritate your incisions.    5. No sexual activity and nothing in the vagina for 4 weeks.   6. You may experience a small amount of clear drainage from your incisions, which is normal.  If the drainage persists, increases, or changes color please call the office.   7. Do not use creams, lotions, or ointments such as neosporin on your incisions after surgery until advised by your surgeon because they can cause removal of the dermabond glue on your incisions.     8. You may experience vaginal spotting after surgery or around the 6-8 week mark from surgery when the stitches at the top of the vagina begin to dissolve.  The spotting is normal but if you experience heavy bleeding, call our office.   9. Take Tylenol or ibuprofen first for pain and only use narcotic pain medication for severe pain not relieved by the Tylenol or Ibuprofen.  Monitor your Tylenol intake to a max of 4,000 mg in a 24 hour period. You can alternate these medications after surgery.   Diet: 1. Low sodium Heart Healthy Diet is recommended but you are cleared to resume your normal (before surgery) diet after your procedure.   2. It is safe to use a laxative, such as Miralax or Colace, if you have difficulty moving your bowels. You have been  prescribed Sennakot-S to take at bedtime every evening after surgery to keep bowel movements regular and to prevent constipation.     Wound Care: 1.  Keep clean and dry.  Shower daily.   Reasons to call the Doctor: Fever - Oral temperature greater than 100.4 degrees Fahrenheit Foul-smelling vaginal discharge Difficulty urinating Nausea and vomiting Increased pain at the site of the incision that is unrelieved with pain medicine. Difficulty breathing with or without chest pain New calf pain especially if only on one side Sudden, continuing increased vaginal bleeding with or without clots.   Contacts: For questions or concerns you should contact:   Dr. Jeral Pinch at [phone]   Joylene John, NP at [phone]   After Hours: call [phone] and have the GYN Oncologist paged/contacted (after 5 pm or on the weekends).   Messages sent via mychart are for non-urgent matters and are not responded to after hours so for urgent needs, please call the after hours number.

## 2021-01-01 NOTE — H&P (View-Only) (Signed)
GYNECOLOGIC ONCOLOGY NEW PATIENT CONSULTATION   Patient Name: Jacqueline Adams  Patient Age: 60 y.o. Date of Service: 01/01/21 Referring Provider: Dr. Nicholas Lose  Primary Care Provider: Robyne Peers, MD Consulting Provider: Jeral Pinch, MD   Assessment/Plan:  Postmenopausal patient with ER negative breast cancer and known BRCA2 mutation.    The risk of ovarian cancer in patients with BRCA2 mutations is approximately 20% to the age of 68.  In addition, patients with BRCA mutations are also at increased risk of breast cancer, and an increased (but still low) risk of pancreatic cancer and melanoma.  The [employer]  (NCCN) recommends removal of bilateral ovaries and fallopian tubes between the ages of 31-40, and/or when childbearing is completed, to reduce the risk of ovarian and fallopian tube cancer.  If a patient does not undergo risk-reducing surgery, it is recommended they have CA-125 serum levels and pelvic ultrasounds every 6 months as a screening approach, although these are not particularly sensitive methods of screening since no true screening test exists for ovarian cancer.  Preventive surgery to remove the ovaries and fallopian tubes reduces the risk of a related cancer by 80% in women who carry a BRCA1 or BRCA2 mutation.  Women who undergo preventive surgery retain a 4% risk of developing cancer of the peritoneum.   The patient has already undergone hysterectomy and at least partial salpingectomy.   The patient would like to move forward with scheduling risk-reducing surgery to include robotic bilateral oophorectomy, resection of any tubal remnants, possible lap, possible staging. We discussed risk of occult or metastatic cancer at the time of surgery. If evidence of metastatic disease, I would plan to send tissue for frozen section to help guide goals of surgery (tumor debulking if appeared to be ovarian cancer and not recurrent breast cancer for  instance).   The risks of surgery were discussed in detail and she understands these to include infection; wound separation; hernia; injury to adjacent organs such as bowel, bladder, blood vessels, ureters and nerves; bleeding which may require blood transfusion; anesthesia risk; thromboembolic events; possible death; unforeseen complications; possible need for re-exploration; medical complications such as heart attack, stroke, pleural effusion and pneumonia; and, if full lymphadenectomy is performed the risk of lymphedema and lymphocyst. The patient will receive DVT and antibiotic prophylaxis as indicated. She voiced a clear understanding. She had the opportunity to ask questions. Perioperative instructions were reviewed with her. Prescriptions for post-op medications were sent to her pharmacy of choice.  Will check with the patient's neurologist about MS medications in the peri-operative period.   A copy of this note was sent to the patient's referring provider.   75 minutes of total time was spent for this patient encounter, including preparation, face-to-face counseling with the patient and coordination of care, and documentation of the encounter.   Jeral Pinch, MD  Division of Gynecologic Oncology  Department of Obstetrics and Gynecology  [facility]  ___________________________________________  Chief Complaint: Chief Complaint  Patient presents with   BRCA gene mutation positive in female    History of Present Illness:  Jacqueline Adams is a 60 y.o. y.o. female who is seen in consultation at the request of Dr. [name] for an evaluation of Dr. [name].  Patient's history is notable for estrogen and progesterone receptor negative breast cancer with treatment summarized below.  She was found to have a BRCA2 mutation.  In terms of her breast cancer, she is s/p neoadjuvant chemotherapy, surgery, and  adjuvant radiation (completed 12/18/20).   Patient reports  overall doing well since completing treatment.  Plan is for her to start olaparib in January.  She has some tingling pain in her right chest as well as itching.  She endorses a good appetite without nausea or emesis.  She reports normal bowel and bladder function.  She denies any pelvic or abdominal pain.  Patient was in [place] with her husband and son.  Her daughter will be home for the holidays.  Recently lost her mother after she fell and had a brain bleed with subsequent seizures.  Her mother had a history of ovarian and breast cancer, as well as leukemia.  In terms of her GYN history, patient underwent total hysterectomy with removal of at least a portion of her fallopian tubes in 2006 for abnormal uterine bleeding.  Pathology from that revealed disordered proliferative endometrium, adenomyosis and leiomyomata, no hyperplasia or malignancy.  Benign fallopian tube tissue, completely transected, was noted.  Treatment History: Oncology History  Malignant neoplasm of upper-outer quadrant of right breast in female, estrogen receptor negative ([place])  01/10/2020 Initial Diagnosis   Palpable right breast mass. Mammogram showed a 1.8cm mass at the 10:30 position 2cm from the nipple, a 1.5cm mass at the 10:30 position 7cm from the nipple, and indeterminate calcifications between the two masses in the right breast. 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    01/12/2020 Cancer Staging   Staging form: Breast, AJCC 8th Edition - Clinical stage from 01/12/2020: Stage IB (cT1c, cN0, cM0, G3, ER-, PR-, HER2-) - Signed by Nicholas Lose, MD on 02/03/2020    02/23/2020 -  Chemotherapy    Patient is on Treatment Plan: BREAST DOSE DENSE AC Q14D / CARBOPLATIN D1 + PACLITAXEL D1,8,15 Q21D       08/08/2020 Surgery   Right breast lumpectomy: scattered foci of DCIS with calcifications, intermediate to high-grade, largest measuring 0.3 cm,  no residual invasive carcinoma, resection margins negative for DCIS, and all 3 axillary nodes negative for carcinoma    PAST MEDICAL HISTORY:  Past Medical History:  Diagnosis Date   BRCA2 gene mutation positive in female    Breast cancer ([place]) 01/2020   right breast IDC   Bronchitis    Cervical spondylarthritis    Headache(784.0)    High cholesterol    Leukocytosis 2009   Multiple sclerosis ([place]) 2009   followed by Dr. Erling Cruz   Neuromuscular disorder [facility])    Pneumonia    Rhinitis    Seasonal allergies    Sinusitis      PAST SURGICAL HISTORY:  Past Surgical History:  Procedure Laterality Date   ABDOMINAL HYSTERECTOMY  2006   uterus, cervix, portion of fallopian tubes for AUB   BREAST LUMPECTOMY WITH RADIOACTIVE SEED AND SENTINEL LYMPH NODE BIOPSY Right 08/08/2020   Procedure: RIGHT BREAST LUMPECTOMY WITH RADIOACTIVE SEED X 3 AND SENTINEL LYMPH NODE BIOPSY;  Surgeon: Stark Klein, MD;  Location: [facility];  Service: General;  Laterality: Right;   CESAREAN SECTION     x 3   CORRECTION HAMMER TOE Bilateral    PORT-A-CATH REMOVAL Right 09/21/2020   Procedure: REMOVAL PORT-A-CATH;  Surgeon: Stark Klein, MD;  Location: [facility];  Service: General;  Laterality: Right;   PORTACATH PLACEMENT Right 02/22/2020   Procedure: RIGHT SUBCLAVIAN VEIN PORT PLACEMENT;  Surgeon: Donnie Mesa, MD;  Location: [facility];  Service: General;  Laterality: Right;  OB/GYN HISTORY:  OB History  Gravida Para Term Preterm AB Living  3 2          SAB IAB Ectopic Multiple Live Births               # Outcome Date GA Lbr Len/2nd Weight Sex Delivery Anes PTL Lv  3 Gravida           2 Para           1 Para             No LMP recorded. Patient has had a hysterectomy.  Age at menarche: 58 Age at menopause: Hysterectomy at 65 Hx of HRT: Denies Hx of STDs: Denies Last pap: Patient thinks 3 years ago, I do not see anything in our system; denies any history of abnormal Pap smears prior  to or after her hysterectomy Birth control history: Patient reports being on OCPs for about a year prior to her hysterectomy although had to stop taking them because caused migraines  SCREENING STUDIES:  Last mammogram: 2022  Last colonoscopy: 2015  MEDICATIONS: Outpatient Encounter Medications as of 01/01/2021  Medication Sig   Calcium Carb-Cholecalciferol (CALCIUM 600+D3 PO) Take 1 tablet by mouth in the morning and at bedtime.   Cholecalciferol (VITAMIN D3) 25 MCG (1000 UT) CAPS Take 1,000 Units by mouth in the morning.   Ferrous Sulfate Dried (SLOW RELEASE IRON) 45 MG TBCR Take 45 mg by mouth in the morning.   fexofenadine (ALLEGRA) 180 MG tablet Take 180 mg by mouth daily as needed for allergies.   fluticasone (FLONASE) 50 MCG/ACT nasal spray Place 2 sprays into both nostrils daily. (Patient taking differently: Place 2 sprays into both nostrils daily as needed for allergies.)   ibuprofen (ADVIL) 200 MG tablet Take 400 mg by mouth every Friday. W/Avonex injection   ibuprofen (ADVIL) 200 MG tablet Take 400 mg by mouth every 6 (six) hours as needed for headache or mild pain.   interferon beta-1a (AVONEX PREFILLED) 30 MCG/0.5ML PSKT injection INJECT ONE SYRINGE INTRAMUSCULARLY ONCE WEEKLY. REFRIGERATE. PROTECT FROM LIGHT. ALLOW TO WARM TO ROOM TEMPERATURE PRIOR TO USE. (Patient taking differently: Inject 30 mcg into the muscle every Friday. INJECT ONE SYRINGE INTRAMUSCULARLY ONCE WEEKLY. REFRIGERATE. PROTECT FROM LIGHT. ALLOW TO WARM TO ROOM TEMPERATURE PRIOR TO USE.)   KLOR-CON M20 20 MEQ tablet TAKE 1 TABLET BY MOUTH EVERY DAY IN THE MORNING   metoprolol succinate (TOPROL-XL) 25 MG 24 hr tablet Take 25 mg by mouth daily.   Multiple Vitamin (MULTIVITAMIN WITH MINERALS) TABS tablet Take 1 tablet by mouth every evening. Centrum   Omega-3 Fatty Acids (FISH OIL) 1000 MG CPDR Take 1,000 mg by mouth daily.   simvastatin (ZOCOR) 20 MG tablet Take 20 mg by mouth every evening.   VENTOLIN HFA 108 (90  BASE) MCG/ACT inhaler Inhale 1-2 puffs into the lungs every 6 (six) hours as needed for shortness of breath or wheezing.   meclizine (ANTIVERT) 25 MG tablet Take 1 tablet (25 mg total) by mouth 3 (three) times daily as needed for dizziness. (Patient not taking: Reported on 01/01/2021)   prochlorperazine (COMPAZINE) 10 MG tablet Take 1 tablet (10 mg total) by mouth every 6 (six) hours as needed (Nausea or vomiting). (Patient not taking: Reported on 01/01/2021)   No facility-administered encounter medications on file as of 01/01/2021.    ALLERGIES:  Allergies  Allergen Reactions   Nabumetone Swelling and Hives   Procaine Swelling   Shellfish Allergy Anaphylaxis  FAMILY HISTORY:  Family History  Problem Relation Age of Onset   Cancer Mother 27       ovarian cancer; now with suspected breast cancer   Diabetes Mother        also HTN   Breast cancer Mother    Clotting disorder Father    Prostate cancer Father    Colon cancer Sister 85       anal cancer (2014)   Lung cancer Sister 61   Stroke Sister 46   Ovarian cancer Sister 63   BRCA 1/2 Sister    Cancer Maternal Grandmother        unknown female reproductive organ cancer   Breast cancer Maternal Aunt    Breast cancer Maternal Aunt    Breast cancer Maternal Aunt      SOCIAL HISTORY:  Social Connections: Socially Integrated   Frequency of Communication with Friends and Family: More than three times a week   Frequency of Social Gatherings with Friends and Family: Not on file   Attends Religious Services: More than 4 times per year   Active Member of Clubs or Organizations: Not on file   Attends Music therapist: More than 4 times per year   Marital Status: Married    REVIEW OF SYSTEMS:  + hot flashes Denies appetite changes, fevers, chills, fatigue, unexplained weight changes. Denies hearing loss, neck lumps or masses, mouth sores, ringing in ears or voice changes. Denies cough or wheezing.  Denies  shortness of breath. Denies chest pain or palpitations. Denies leg swelling. Denies abdominal distention, pain, blood in stools, constipation, diarrhea, nausea, vomiting, or early satiety. Denies pain with intercourse, dysuria, frequency, hematuria or incontinence. Denies pelvic pain, vaginal bleeding or vaginal discharge.   Denies joint pain, back pain or muscle pain/cramps. Denies itching, rash, or wounds. Denies dizziness, headaches, numbness or seizures. Denies swollen lymph nodes or glands, denies easy bruising or bleeding. Denies anxiety, depression, confusion, or decreased concentration.  Physical Exam:  Vital Signs for this encounter:  Blood pressure 127/76, pulse 88, temperature 98.8 F (37.1 C), temperature source Tympanic, resp. rate 16, height _0  (1.626 m), weight 211 lb 6.4 oz (95.9 kg), SpO2 98 %. Body mass index is 36.29 kg/m. General: Alert, oriented, no acute distress.  HEENT: Normocephalic, atraumatic. Sclera anicteric.  Chest: Clear to auscultation bilaterally. No wheezes, rhonchi, or rales. Cardiovascular: Regular rate and rhythm, no murmurs, rubs, or gallops.  Abdomen: Obese. Normoactive bowel sounds. Soft, nondistended, nontender to palpation. No masses or hepatosplenomegaly appreciated. No palpable fluid wave.  Well-healed Pfannenstiel incision. Extremities: Grossly normal range of motion. Warm, well perfused. No edema bilaterally.  Skin: No rashes or lesions.  Lymphatics: No cervical, supraclavicular, or inguinal adenopathy.  GU:  Normal external female genitalia.  No lesions. No discharge or bleeding.             Bladder/urethra:  No lesions or masses, well supported bladder             Vagina: Well rugated, no lesions or masses.             Cervix: Surgically absent.             Uterus: Surgically absent.             Adnexa: No masses.  Rectal: Deferred.  LABORATORY AND RADIOLOGIC DATA:  Outside medical records were reviewed to synthesize the above  history, along with the history and physical obtained during the visit.   Lab Results  Component Value Date   WBC 8.7 12/13/2020   HGB 12.0 12/13/2020   HCT 35.9 (L) 12/13/2020   PLT 244 12/13/2020   GLUCOSE 109 (H) 12/13/2020   CHOL 194 03/25/2019   TRIG 99 03/25/2019   HDL 43 03/25/2019   LDLCALC 133 (H) 03/25/2019   ALT 20 12/13/2020   AST 24 12/13/2020   NA 142 12/13/2020   K 3.7 12/13/2020   CL 107 12/13/2020   CREATININE 0.67 12/13/2020   BUN 8 12/13/2020   CO2 25 12/13/2020   TSH 0.873 03/08/2020

## 2021-01-01 NOTE — Patient Instructions (Addendum)
Preparing for your Surgery  Plan for surgery on January 18, 2021 with Dr. Jeral Pinch at [place] will be scheduled for robotic assisted laparoscopic bilateral oophorectomy (removal of both ovaries), resection of any residual fallopian tubes, possible laparotomy (larger incision on your abdomen if needed), possible staging (if a cancer is identified).   We will reach out to Dr. Felecia Shelling for recommendations regarding your interferon before and after surgery.  Pre-operative Testing -You will receive a phone call from presurgical testing at [facility] to arrange for a pre-operative appointment and lab work.  -Bring your insurance card, copy of an advanced directive if applicable, medication list  -At that visit, you will be asked to sign a consent for a possible blood transfusion in case a transfusion becomes necessary during surgery.  The need for a blood transfusion is rare but having consent is a necessary part of your care.     -You should not be taking blood thinners or aspirin at least ten days prior to surgery unless instructed by your surgeon.  -Do not take supplements such as fish oil (omega 3), red yeast rice, turmeric before your surgery. You want to avoid medications with aspirin in them including headache powders such as BC or Goody's), Excedrin migraine. You are fine to take tylenol as needed.  Day Before Surgery at [place] will be asked to take in a light diet the day before surgery. You will be advised you can have clear liquids up until 3 hours before your surgery.    Eat a light diet the day before surgery.  Examples including soups, broths, toast, yogurt, mashed potatoes.  AVOID GAS PRODUCING FOODS. Things to avoid include carbonated beverages (fizzy beverages, sodas), raw fruits and raw vegetables (uncooked), or beans.   If your bowels are filled with gas, your surgeon will have difficulty visualizing your pelvic organs which increases your  surgical risks.  Your role in recovery Your role is to become active as soon as directed by your doctor, while still giving yourself time to heal.  Rest when you feel tired. You will be asked to do the following in order to speed your recovery:  - Cough and breathe deeply. This helps to clear and expand your lungs and can prevent pneumonia after surgery.  - [place]. Do mild physical activity. Walking or moving your legs help your circulation and body functions return to normal. Do not try to get up or walk alone the first time after surgery.   -If you develop swelling on one leg or the other, pain in the back of your leg, redness/warmth in one of your legs, please call the office or go to the Emergency Room to have a doppler to rule out a blood clot. For shortness of breath, chest pain-seek care in the Emergency Room as soon as possible. - Actively manage your pain. Managing your pain lets you move in comfort. We will ask you to rate your pain on a scale of zero to 10. It is your responsibility to tell your doctor or nurse where and how much you hurt so your pain can be treated.  Special Considerations -If you are diabetic, you may be placed on insulin after surgery to have closer control over your blood sugars to promote healing and recovery.  This does not mean that you will be discharged on insulin.  If applicable, your oral antidiabetics will be resumed when you are tolerating a solid diet.  -  Your final pathology results from surgery should be available around one week after surgery and the results will be relayed to you when available.  -Dr. Lahoma Crocker is the surgeon that assists your GYN Oncologist with surgery.  If you end up staying the night, the next day after your surgery you will either see Dr. Berline Lopes or Dr. Lahoma Crocker.  -FMLA forms can be faxed to [phone] and please allow 5-7 business days for completion.  Pain Management After Surgery -You  have been prescribed your pain medication and bowel regimen medications before surgery so that you can have these available when you are discharged from the hospital. The pain medication is for use ONLY AFTER surgery and a new prescription will not be given.   -Make sure that you have Tylenol and Ibuprofen at home to use on a regular basis after surgery for pain control. We recommend alternating the medications every hour to six hours since they work differently and are processed in the body differently for pain relief.  -Review the attached handout on narcotic use and their risks and side effects.   Bowel Regimen -You have been prescribed Sennakot-S to take nightly to prevent constipation especially if you are taking the narcotic pain medication intermittently.  It is important to prevent constipation and drink adequate amounts of liquids. You can stop taking this medication when you are not taking pain medication and you are back on your normal bowel routine.  Risks of Surgery Risks of surgery are low but include bleeding, infection, damage to surrounding structures, re-operation, blood clots, and very rarely death.   Blood Transfusion Information (For the consent to be signed before surgery)  We will be checking your blood type before surgery so in case of emergencies, we will know what type of blood you would need.                                            WHAT IS A BLOOD TRANSFUSION?  A transfusion is the replacement of blood or some of its parts. Blood is made up of multiple cells which provide different functions. Red blood cells carry oxygen and are used for blood loss replacement. White blood cells fight against infection. Platelets control bleeding. Plasma helps clot blood. Other blood products are available for specialized needs, such as hemophilia or other clotting disorders. BEFORE THE TRANSFUSION  Who gives blood for transfusions?  You may be able to donate blood to be used  at a later date on yourself (autologous donation). Relatives can be asked to donate blood. This is generally not any safer than if you have received blood from a stranger. The same precautions are taken to ensure safety when a relative's blood is donated. Healthy volunteers who are fully evaluated to make sure their blood is safe. This is blood bank blood. Transfusion therapy is the safest it has ever been in the practice of medicine. Before blood is taken from a donor, a complete history is taken to make sure that person has no history of diseases nor engages in risky social behavior (examples are intravenous drug use or sexual activity with multiple partners). The donor's travel history is screened to minimize risk of transmitting infections, such as malaria. The donated blood is tested for signs of infectious diseases, such as HIV and hepatitis. The blood is then tested to be sure it is compatible  with you in order to minimize the chance of a transfusion reaction. If you or a relative donates blood, this is often done in anticipation of surgery and is not appropriate for emergency situations. It takes many days to process the donated blood. RISKS AND COMPLICATIONS Although transfusion therapy is very safe and saves many lives, the main dangers of transfusion include:  Getting an infectious disease. Developing a transfusion reaction. This is an allergic reaction to something in the blood you were given. Every precaution is taken to prevent this. The decision to have a blood transfusion has been considered carefully by your caregiver before blood is given. Blood is not given unless the benefits outweigh the risks.  AFTER SURGERY INSTRUCTIONS  Return to work: 4-6 weeks if applicable  Activity: 1. Be up and out of the bed during the day.  Take a nap if needed.  You may walk up steps but be careful and use the hand rail.  Stair climbing will tire you more than you think, you may need to stop part way  and rest.   2. No lifting or straining for 6 weeks over 10 pounds. No pushing, pulling, straining for 6 weeks.  3. No driving for 1 week(s).  Do not drive if you are taking narcotic pain medicine and make sure that your reaction time has returned.   4. You can shower as soon as the next day after surgery. Shower daily.  Use your regular soap and water (not directly on the incision) and pat your incision(s) dry afterwards; don't rub.  No tub baths or submerging your body in water until cleared by your surgeon. If you have the soap that was given to you by pre-surgical testing that was used before surgery, you do not need to use it afterwards because this can irritate your incisions.   5. No sexual activity and nothing in the vagina for 4 weeks.  6. You may experience a small amount of clear drainage from your incisions, which is normal.  If the drainage persists, increases, or changes color please call the office.  7. Do not use creams, lotions, or ointments such as neosporin on your incisions after surgery until advised by your surgeon because they can cause removal of the dermabond glue on your incisions.    8. You may experience vaginal spotting after surgery or around the 6-8 week mark from surgery when the stitches at the top of the vagina begin to dissolve.  The spotting is normal but if you experience heavy bleeding, call our office.  9. Take Tylenol or ibuprofen first for pain and only use narcotic pain medication for severe pain not relieved by the Tylenol or Ibuprofen.  Monitor your Tylenol intake to a max of 4,000 mg in a 24 hour period. You can alternate these medications after surgery.  Diet: 1. Low sodium Heart Healthy Diet is recommended but you are cleared to resume your normal (before surgery) diet after your procedure.  2. It is safe to use a laxative, such as Miralax or Colace, if you have difficulty moving your bowels. You have been prescribed Sennakot-S to take at bedtime  every evening after surgery to keep bowel movements regular and to prevent constipation.    Wound Care: 1. Keep clean and dry.  Shower daily.  Reasons to call the Doctor: Fever - Oral temperature greater than 100.4 degrees Fahrenheit Foul-smelling vaginal discharge Difficulty urinating Nausea and vomiting Increased pain at the site of the incision that is unrelieved  with pain medicine. Difficulty breathing with or without chest pain New calf pain especially if only on one side Sudden, continuing increased vaginal bleeding with or without clots.   Contacts: For questions or concerns you should contact:  Dr. Jeral Pinch at [phone]  Joylene John, NP at [phone]  After Hours: call [phone] and have the GYN Oncologist paged/contacted (after 5 pm or on the weekends).  Messages sent via mychart are for non-urgent matters and are not responded to after hours so for urgent needs, please call the after hours number.

## 2021-01-01 NOTE — Progress Notes (Signed)
GYNECOLOGIC ONCOLOGY NEW PATIENT CONSULTATION  ° °Patient Name: Jacqueline Adams  °Patient Age: 60 y.o. °Date of Service: 01/01/21 °Referring Provider: Dr. Vinay Gudena ° °Primary Care Provider: Aguiar, Rafaela M, MD °Consulting Provider: Zaina Jenkin, MD  ° °Assessment/Plan:  °Postmenopausal patient with ER negative breast cancer and known BRCA2 mutation.   ° °The risk of ovarian cancer in patients with BRCA2 mutations is approximately 20% to the age of 75.  In addition, patients with BRCA mutations are also at increased risk of breast cancer, and an increased (but still low) risk of pancreatic cancer and melanoma. ° °The National Comprehensive Cancer Network (NCCN) recommends removal of bilateral ovaries and fallopian tubes between the ages of 35-40, and/or when childbearing is completed, to reduce the risk of ovarian and fallopian tube cancer.  If a patient does not undergo risk-reducing surgery, it is recommended they have CA-125 serum levels and pelvic ultrasounds every 6 months as a screening approach, although these are not particularly sensitive methods of screening since no true screening test exists for ovarian cancer. ° °Preventive surgery to remove the ovaries and fallopian tubes reduces the risk of a related cancer by 80% in women who carry a BRCA1 or BRCA2 mutation.  Women who undergo preventive surgery retain a 4% risk of developing cancer of the peritoneum.  ° °The patient has already undergone hysterectomy and at least partial salpingectomy.  ° °The patient would like to move forward with scheduling risk-reducing surgery to include robotic bilateral oophorectomy, resection of any tubal remnants, possible lap, possible staging. We discussed risk of occult or metastatic cancer at the time of surgery. If evidence of metastatic disease, I would plan to send tissue for frozen section to help guide goals of surgery (tumor debulking if appeared to be ovarian cancer and not recurrent breast cancer for  instance).  ° °The risks of surgery were discussed in detail and she understands these to include infection; wound separation; hernia; injury to adjacent organs such as bowel, bladder, blood vessels, ureters and nerves; bleeding which may require blood transfusion; anesthesia risk; thromboembolic events; possible death; unforeseen complications; possible need for re-exploration; medical complications such as heart attack, stroke, pleural effusion and pneumonia; and, if full lymphadenectomy is performed the risk of lymphedema and lymphocyst. The patient will receive DVT and antibiotic prophylaxis as indicated. She voiced a clear understanding. She had the opportunity to ask questions. Perioperative instructions were reviewed with her. Prescriptions for post-op medications were sent to her pharmacy of choice. ° °Will check with the patient's neurologist about MS medications in the peri-operative period.  ° °A copy of this note was sent to the patient's referring provider.  ° °75 minutes of total time was spent for this patient encounter, including preparation, face-to-face counseling with the patient and coordination of care, and documentation of the encounter. ° ° °Jacqueline Kenley, MD  °Division of Gynecologic Oncology  °Department of Obstetrics and Gynecology  °[facility]  °___________________________________________  °Chief Complaint: °Chief Complaint  °Patient presents with  ° BRCA gene mutation positive in female  ° ° °History of Present Illness:  °Jacqueline Adams is a 60 y.o. y.o. female who is seen in consultation at the request of Dr. Gudena for an evaluation of Dr. Gudena. ° °Patient's history is notable for estrogen and progesterone receptor negative breast cancer with treatment summarized below.  She was found to have a BRCA2 mutation.  In terms of her breast cancer, she is s/p neoadjuvant chemotherapy, surgery, and   adjuvant radiation (completed 12/18/20).   Patient reports  overall doing well since completing treatment.  Plan is for her to start olaparib in January.  She has some tingling pain in her right chest as well as itching.  She endorses a good appetite without nausea or emesis.  She reports normal bowel and bladder function.  She denies any pelvic or abdominal pain.  Patient was in [place] with her husband and son.  Her daughter will be home for the holidays.  Recently lost her mother after she fell and had a brain bleed with subsequent seizures.  Her mother had a history of ovarian and breast cancer, as well as leukemia.  In terms of her GYN history, patient underwent total hysterectomy with removal of at least a portion of her fallopian tubes in 2006 for abnormal uterine bleeding.  Pathology from that revealed disordered proliferative endometrium, adenomyosis and leiomyomata, no hyperplasia or malignancy.  Benign fallopian tube tissue, completely transected, was noted.  Treatment History: Oncology History  Malignant neoplasm of upper-outer quadrant of right breast in female, estrogen receptor negative ([place])  01/10/2020 Initial Diagnosis   Palpable right breast mass. Mammogram showed a 1.8cm mass at the 10:30 position 2cm from the nipple, a 1.5cm mass at the 10:30 position 7cm from the nipple, and indeterminate calcifications between the two masses in the right breast. 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    01/12/2020 Cancer Staging   Staging form: Breast, AJCC 8th Edition - Clinical stage from 01/12/2020: Stage IB (cT1c, cN0, cM0, G3, ER-, PR-, HER2-) - Signed by Nicholas Lose, MD on 02/03/2020    02/23/2020 -  Chemotherapy    Patient is on Treatment Plan: BREAST DOSE DENSE AC Q14D / CARBOPLATIN D1 + PACLITAXEL D1,8,15 Q21D       08/08/2020 Surgery   Right breast lumpectomy: scattered foci of DCIS with calcifications, intermediate to high-grade, largest measuring 0.3 cm,  no residual invasive carcinoma, resection margins negative for DCIS, and all 3 axillary nodes negative for carcinoma    PAST MEDICAL HISTORY:  Past Medical History:  Diagnosis Date   BRCA2 gene mutation positive in female    Breast cancer ([place]) 01/2020   right breast IDC   Bronchitis    Cervical spondylarthritis    Headache(784.0)    High cholesterol    Leukocytosis 2009   Multiple sclerosis ([place]) 2009   followed by Dr. Erling Cruz   Neuromuscular disorder [facility])    Pneumonia    Rhinitis    Seasonal allergies    Sinusitis      PAST SURGICAL HISTORY:  Past Surgical History:  Procedure Laterality Date   ABDOMINAL HYSTERECTOMY  2006   uterus, cervix, portion of fallopian tubes for AUB   BREAST LUMPECTOMY WITH RADIOACTIVE SEED AND SENTINEL LYMPH NODE BIOPSY Right 08/08/2020   Procedure: RIGHT BREAST LUMPECTOMY WITH RADIOACTIVE SEED X 3 AND SENTINEL LYMPH NODE BIOPSY;  Surgeon: Stark Klein, MD;  Location: [place];  Service: General;  Laterality: Right;   CESAREAN SECTION     x 3   CORRECTION HAMMER TOE Bilateral    PORT-A-CATH REMOVAL Right 09/21/2020   Procedure: REMOVAL PORT-A-CATH;  Surgeon: Stark Klein, MD;  Location: [place];  Service: General;  Laterality: Right;   PORTACATH PLACEMENT Right 02/22/2020   Procedure: RIGHT SUBCLAVIAN VEIN PORT PLACEMENT;  Surgeon: Donnie Mesa, MD;  Location: [place] [facility];  Service: General;  Laterality: Right;  OB/GYN HISTORY:  OB History  Gravida Para Term Preterm AB Living  3 2          SAB IAB Ectopic Multiple Live Births               # Outcome Date GA Lbr Len/2nd Weight Sex Delivery Anes PTL Lv  3 Gravida           2 Para           1 Para             No LMP recorded. Patient has had a hysterectomy.  Age at menarche: 32 Age at menopause: Hysterectomy at 44 Hx of HRT: Denies Hx of STDs: Denies Last pap: Patient thinks 3 years ago, I do not see anything in our system; denies any history of abnormal Pap smears prior  to or after her hysterectomy Birth control history: Patient reports being on OCPs for about a year prior to her hysterectomy although had to stop taking them because caused migraines  SCREENING STUDIES:  Last mammogram: 2022  Last colonoscopy: 2015  MEDICATIONS: Outpatient Encounter Medications as of 01/01/2021  Medication Sig   Calcium Carb-Cholecalciferol (CALCIUM 600+D3 PO) Take 1 tablet by mouth in the morning and at bedtime.   Cholecalciferol (VITAMIN D3) 25 MCG (1000 UT) CAPS Take 1,000 Units by mouth in the morning.   Ferrous Sulfate Dried (SLOW RELEASE IRON) 45 MG TBCR Take 45 mg by mouth in the morning.   fexofenadine (ALLEGRA) 180 MG tablet Take 180 mg by mouth daily as needed for allergies.   fluticasone (FLONASE) 50 MCG/ACT nasal spray Place 2 sprays into both nostrils daily. (Patient taking differently: Place 2 sprays into both nostrils daily as needed for allergies.)   ibuprofen (ADVIL) 200 MG tablet Take 400 mg by mouth every Friday. W/Avonex injection   ibuprofen (ADVIL) 200 MG tablet Take 400 mg by mouth every 6 (six) hours as needed for headache or mild pain.   interferon beta-1a (AVONEX PREFILLED) 30 MCG/0.5ML PSKT injection INJECT ONE SYRINGE INTRAMUSCULARLY ONCE WEEKLY. REFRIGERATE. PROTECT FROM LIGHT. ALLOW TO WARM TO ROOM TEMPERATURE PRIOR TO USE. (Patient taking differently: Inject 30 mcg into the muscle every Friday. INJECT ONE SYRINGE INTRAMUSCULARLY ONCE WEEKLY. REFRIGERATE. PROTECT FROM LIGHT. ALLOW TO WARM TO ROOM TEMPERATURE PRIOR TO USE.)   KLOR-CON M20 20 MEQ tablet TAKE 1 TABLET BY MOUTH EVERY DAY IN THE MORNING   metoprolol succinate (TOPROL-XL) 25 MG 24 hr tablet Take 25 mg by mouth daily.   Multiple Vitamin (MULTIVITAMIN WITH MINERALS) TABS tablet Take 1 tablet by mouth every evening. Centrum   Omega-3 Fatty Acids (FISH OIL) 1000 MG CPDR Take 1,000 mg by mouth daily.   simvastatin (ZOCOR) 20 MG tablet Take 20 mg by mouth every evening.   VENTOLIN HFA 108 (90  BASE) MCG/ACT inhaler Inhale 1-2 puffs into the lungs every 6 (six) hours as needed for shortness of breath or wheezing.   meclizine (ANTIVERT) 25 MG tablet Take 1 tablet (25 mg total) by mouth 3 (three) times daily as needed for dizziness. (Patient not taking: Reported on 01/01/2021)   prochlorperazine (COMPAZINE) 10 MG tablet Take 1 tablet (10 mg total) by mouth every 6 (six) hours as needed (Nausea or vomiting). (Patient not taking: Reported on 01/01/2021)   No facility-administered encounter medications on file as of 01/01/2021.    ALLERGIES:  Allergies  Allergen Reactions   Nabumetone Swelling and Hives   Procaine Swelling   Shellfish Allergy Anaphylaxis  FAMILY HISTORY:  °Family History  °Problem Relation Age of Onset  ° Cancer Mother 79  °     ovarian cancer; now with suspected breast cancer  ° Diabetes Mother   °     also HTN  ° Breast cancer Mother   ° Clotting disorder Father   ° Prostate cancer Father   ° Colon cancer Sister 59  °     anal cancer (2014)  ° Lung cancer Sister 63  ° Stroke Sister 63  ° Ovarian cancer Sister 52  ° BRCA 1/2 Sister   ° Cancer Maternal Grandmother   °     unknown female reproductive organ cancer  ° Breast cancer Maternal Aunt   ° Breast cancer Maternal Aunt   ° Breast cancer Maternal Aunt   °  ° °SOCIAL HISTORY:  °Social Connections: Socially Integrated  ° Frequency of Communication with Friends and Family: More than three times a week  ° Frequency of Social Gatherings with Friends and Family: Not on file  ° Attends Religious Services: More than 4 times per year  ° Active Member of Clubs or Organizations: Not on file  ° Attends Club or Organization Meetings: More than 4 times per year  ° Marital Status: Married  ° ° °REVIEW OF SYSTEMS:  °+ hot flashes °Denies appetite changes, fevers, chills, fatigue, unexplained weight changes. °Denies hearing loss, neck lumps or masses, mouth sores, ringing in ears or voice changes. °Denies cough or wheezing.  Denies  shortness of breath. °Denies chest pain or palpitations. Denies leg swelling. °Denies abdominal distention, pain, blood in stools, constipation, diarrhea, nausea, vomiting, or early satiety. °Denies pain with intercourse, dysuria, frequency, hematuria or incontinence. °Denies pelvic pain, vaginal bleeding or vaginal discharge.   °Denies joint pain, back pain or muscle pain/cramps. °Denies itching, rash, or wounds. °Denies dizziness, headaches, numbness or seizures. °Denies swollen lymph nodes or glands, denies easy bruising or bleeding. °Denies anxiety, depression, confusion, or decreased concentration. ° °Physical Exam:  °Vital Signs for this encounter:  °Blood pressure 127/76, pulse 88, temperature 98.8 °F (37.1 °C), temperature source Tympanic, resp. rate 16, height 5' 4" (1.626 m), weight 211 lb 6.4 oz (95.9 kg), SpO2 98 %. °Body mass index is 36.29 kg/m². °General: Alert, oriented, no acute distress.  °HEENT: Normocephalic, atraumatic. Sclera anicteric.  °Chest: Clear to auscultation bilaterally. No wheezes, rhonchi, or rales. °Cardiovascular: Regular rate and rhythm, no murmurs, rubs, or gallops.  °Abdomen: Obese. Normoactive bowel sounds. Soft, nondistended, nontender to palpation. No masses or hepatosplenomegaly appreciated. No palpable fluid wave.  Well-healed Pfannenstiel incision. °Extremities: Grossly normal range of motion. Warm, well perfused. No edema bilaterally.  °Skin: No rashes or lesions.  °Lymphatics: No cervical, supraclavicular, or inguinal adenopathy.  °GU:  Normal external female genitalia.  No lesions. No discharge or bleeding. °            Bladder/urethra:  No lesions or masses, well supported bladder °            Vagina: Well rugated, no lesions or masses. °            Cervix: Surgically absent. °            Uterus: Surgically absent. °            Adnexa: No masses. ° Rectal: Deferred. ° °LABORATORY AND RADIOLOGIC DATA:  °Outside medical records were reviewed to synthesize the above  history, along with the history and physical obtained during the visit.  ° °Lab Results  °  Component Value Date   WBC 8.7 12/13/2020   HGB 12.0 12/13/2020   HCT 35.9 (L) 12/13/2020   PLT 244 12/13/2020   GLUCOSE 109 (H) 12/13/2020   CHOL 194 03/25/2019   TRIG 99 03/25/2019   HDL 43 03/25/2019   LDLCALC 133 (H) 03/25/2019   ALT 20 12/13/2020   AST 24 12/13/2020   NA 142 12/13/2020   K 3.7 12/13/2020   CL 107 12/13/2020   CREATININE 0.67 12/13/2020   BUN 8 12/13/2020   CO2 25 12/13/2020   TSH 0.873 03/08/2020

## 2021-01-02 ENCOUNTER — Other Ambulatory Visit: Payer: Self-pay | Admitting: Gynecologic Oncology

## 2021-01-02 DIAGNOSIS — Z1501 Genetic susceptibility to malignant neoplasm of breast: Secondary | ICD-10-CM

## 2021-01-02 DIAGNOSIS — Z1502 Genetic susceptibility to malignant neoplasm of ovary: Secondary | ICD-10-CM

## 2021-01-05 ENCOUNTER — Telehealth: Payer: Self-pay | Admitting: *Deleted

## 2021-01-05 NOTE — Telephone Encounter (Signed)
Fax records and surgical opitimization form to Dr Felecia Shelling office

## 2021-01-08 ENCOUNTER — Telehealth: Payer: Self-pay | Admitting: *Deleted

## 2021-01-08 NOTE — Telephone Encounter (Signed)
Patient called and moved her surgery date from 12/8 to 12/20 due to [place]

## 2021-01-11 ENCOUNTER — Telehealth: Payer: Self-pay | Admitting: *Deleted

## 2021-01-11 NOTE — Patient Instructions (Addendum)
DUE TO COVID-19 ONLY ONE VISITOR IS ALLOWED TO COME WITH YOU AND STAY IN THE WAITING ROOM ONLY DURING PRE OP AND PROCEDURE.   **NO VISITORS ARE ALLOWED IN THE SHORT STAY AREA OR RECOVERY ROOM!!**  IF YOU WILL BE ADMITTED INTO THE HOSPITAL YOU ARE ALLOWED ONLY TWO SUPPORT PEOPLE DURING VISITATION HOURS ONLY (7 AM -8PM)   The support person(s) must pass our screening, gel in and out, and wear a mask at all times, including in the patient's room. Patients must also wear a mask when staff or their support person are in the room. Visitors GUEST BADGE MUST BE WORN VISIBLY  One adult visitor may remain with you overnight and MUST be in the room by 8 P.M.  No visitors under the age of 59. Any visitor under the age of 36 must be accompanied by an adult.        Your procedure is scheduled on: 01/30/21   Report to [facility] Main Entrance    Report to admitting at : 8:45 AM   Call this number if you have problems the morning of surgery [phone]   Do not eat food :After Midnight.  Eat a light diet the day before surgery.  Examples including soups, broths, toast, yogurt, mashed potatoes.  Things to avoid include carbonated beverages (fizzy beverages), raw fruits and raw vegetables, or beans.   If your bowels are filled with gas, your surgeon will have difficulty visualizing your pelvic organs which increases your surgical risks.   May have liquids until : 8:00 AM   day of surgery  CLEAR LIQUID DIET  Foods Allowed                                                                     Foods Excluded  Water, Black Coffee and tea, regular and decaf                             liquids that you cannot  Plain Jell-O in any flavor  (No red)                                           see through such as: Fruit ices (not with fruit pulp)                                     milk, soups, orange juice              Iced Popsicles (No red)                                    All solid food                                    Apple juices Sports drinks like Gatorade (No red) Lightly seasoned clear broth or consume(fat free) Sugar  Sample  Menu Breakfast                                Lunch                                     Supper Cranberry juice                    Beef broth                            Chicken broth Jell-O                                     Grape juice                           Apple juice Coffee or tea                        Jell-O                                      Popsicle                                                Coffee or tea                        Coffee or tea    DRINK 2 PRESURGERY ENSURE DRINKS THE NIGHT BEFORE SURGERY AT  1000 PM AND 1 PRESURGERY DRINK THE DAY OF THE PROCEDURE 3 HOURS PRIOR TO SCHEDULED SURGERY. NO SOLIDS AFTER MIDNIGHT THE DAY PRIOR TO THE SURGERY. NOTHING BY MOUTH EXCEPT CLEAR LIQUIDS UNTIL THREE HOURS PRIOR TO SCHEDULED SURGERY. PLEASE FINISH PRESURGERY ENSURE DRINK PER SURGEON ORDER 3 HOURS PRIOR TO SCHEDULED SURGERY TIME WHICH NEEDS TO BE COMPLETED AT: 8:00 AM.       The day of surgery:  Drink ONE (1) Pre-Surgery Clear Ensure or G2 by am the morning of surgery. Drink in one sitting. Do not sip.  This drink was given to you during your hospital  pre-op appointment visit. Nothing else to drink after completing the  Pre-Surgery Clear Ensure or G2.          If you have questions, please contact your surgeon's office.     Oral Hygiene is also important to reduce your risk of infection.                                    Remember - BRUSH YOUR TEETH THE MORNING OF SURGERY WITH YOUR REGULAR TOOTHPASTE   Do NOT smoke after Midnight   Take these medicines the morning of surgery with A SIP OF WATER: allegra.Use flonase and inhalers as usual.  DO NOT TAKE ANY ORAL DIABETIC MEDICATIONS DAY OF YOUR SURGERY  You may not have any metal on your body including hair pins, jewelry, and body piercing             Do  not wear make-up, lotions, powders, perfumes/cologne, or deodorant  Do not wear nail polish including gel and S&S, artificial/acrylic nails, or any other type of covering on natural nails including finger and toenails. If you have artificial nails, gel coating, etc. that needs to be removed by a nail salon please have this removed prior to surgery or surgery may need to be canceled/ delayed if the surgeon/ anesthesia feels like they are unable to be safely monitored.   Do not shave  48 hours prior to surgery.    Do not bring valuables to the hospital. [place].   Contacts, dentures or bridgework may not be worn into surgery.   Bring small overnight bag day of surgery.    Patients discharged on the day of surgery will not be allowed to drive home.   Special Instructions: Bring a copy of your healthcare power of attorney and living will documents         the day of surgery if you haven't scanned them before.              Please read over the following fact sheets you were given: IF YOU HAVE QUESTIONS ABOUT YOUR PRE-OP INSTRUCTIONS PLEASE CALL [phone]     [facility] - Preparing for Surgery Before surgery, you can play an important role.  Because skin is not sterile, your skin needs to be as free of germs as possible.  You can reduce the number of germs on your skin by washing with CHG (chlorahexidine gluconate) soap before surgery.  CHG is an antiseptic cleaner which kills germs and bonds with the skin to continue killing germs even after washing. Please DO NOT use if you have an allergy to CHG or antibacterial soaps.  If your skin becomes reddened/irritated stop using the CHG and inform your nurse when you arrive at Short Stay. Do not shave (including legs and underarms) for at least 48 hours prior to the first CHG shower.  You may shave your face/neck. Please follow these instructions carefully:  1.  Shower with CHG Soap the night before  surgery and the  morning of Surgery.  2.  If you choose to wash your hair, wash your hair first as usual with your  normal  shampoo.  3.  After you shampoo, rinse your hair and body thoroughly to remove the  shampoo.                           4.  Use CHG as you would any other liquid soap.  You can apply chg directly  to the skin and wash                       Gently with a scrungie or clean washcloth.  5.  Apply the CHG Soap to your body ONLY FROM THE NECK DOWN.   Do not use on face/ open                           Wound or open sores. Avoid contact with eyes, ears mouth and genitals (private parts).  Wash face,  Genitals (private parts) with your normal soap.             6.  Wash thoroughly, paying special attention to the area where your surgery  will be performed.  7.  Thoroughly rinse your body with warm water from the neck down.  8.  DO NOT shower/wash with your normal soap after using and rinsing off  the CHG Soap.                9.  Pat yourself dry with a clean towel.            10.  Wear clean pajamas.            11.  Place clean sheets on your bed the night of your first shower and do not  sleep with pets. Day of Surgery : Do not apply any lotions/deodorants the morning of surgery.  Please wear clean clothes to the hospital/surgery center.  FAILURE TO FOLLOW THESE INSTRUCTIONS MAY RESULT IN THE CANCELLATION OF YOUR SURGERY PATIENT SIGNATURE_________________________________  NURSE SIGNATURE__________________________________  ________________________________________________________________________  WHAT IS A BLOOD TRANSFUSION? Blood Transfusion Information  A transfusion is the replacement of blood or some of its parts. Blood is made up of multiple cells which provide different functions. Red blood cells carry oxygen and are used for blood loss replacement. White blood cells fight against infection. Platelets control bleeding. Plasma helps clot blood. Other  blood products are available for specialized needs, such as hemophilia or other clotting disorders. BEFORE THE TRANSFUSION  Who gives blood for transfusions?  Healthy volunteers who are fully evaluated to make sure their blood is safe. This is blood bank blood. Transfusion therapy is the safest it has ever been in the practice of medicine. Before blood is taken from a donor, a complete history is taken to make sure that person has no history of diseases nor engages in risky social behavior (examples are intravenous drug use or sexual activity with multiple partners). The donor's travel history is screened to minimize risk of transmitting infections, such as malaria. The donated blood is tested for signs of infectious diseases, such as HIV and hepatitis. The blood is then tested to be sure it is compatible with you in order to minimize the chance of a transfusion reaction. If you or a relative donates blood, this is often done in anticipation of surgery and is not appropriate for emergency situations. It takes many days to process the donated blood. RISKS AND COMPLICATIONS Although transfusion therapy is very safe and saves many lives, the main dangers of transfusion include:  Getting an infectious disease. Developing a transfusion reaction. This is an allergic reaction to something in the blood you were given. Every precaution is taken to prevent this. The decision to have a blood transfusion has been considered carefully by your caregiver before blood is given. Blood is not given unless the benefits outweigh the risks. AFTER THE TRANSFUSION Right after receiving a blood transfusion, you will usually feel much better and more energetic. This is especially true if your red blood cells have gotten low (anemic). The transfusion raises the level of the red blood cells which carry oxygen, and this usually causes an energy increase. The nurse administering the transfusion will monitor you carefully for  complications. HOME CARE INSTRUCTIONS  No special instructions are needed after a transfusion. You may find your energy is better. Speak with your caregiver about any limitations on activity for underlying diseases you may have. SEEK  MEDICAL CARE IF:  Your condition is not improving after your transfusion. You develop redness or irritation at the intravenous (IV) site. SEEK IMMEDIATE MEDICAL CARE IF:  Any of the following symptoms occur over the next 12 hours: Shaking chills. You have a temperature by mouth above 102 F (38.9 C), not controlled by medicine. Chest, back, or muscle pain. People around you feel you are not acting correctly or are confused. Shortness of breath or difficulty breathing. Dizziness and fainting. You get a rash or develop hives. You have a decrease in urine output. Your urine turns a dark color or changes to pink, red, or brown. Any of the following symptoms occur over the next 10 days: You have a temperature by mouth above 102 F (38.9 C), not controlled by medicine. Shortness of breath. Weakness after normal activity. The white part of the eye turns yellow (jaundice). You have a decrease in the amount of urine or are urinating less often. Your urine turns a dark color or changes to pink, red, or brown. Document Released: 01/26/2000 Document Revised: 04/22/2011 Document Reviewed: 09/14/2007 [facility] Patient Information 2014 [place], [place].  _______________________________________________________________________

## 2021-01-11 NOTE — Telephone Encounter (Signed)
Received surgical optimization form from Dr Garth Bigness office, fax copy to pre surgical testing

## 2021-01-12 ENCOUNTER — Encounter (HOSPITAL_COMMUNITY)
Admission: RE | Admit: 2021-01-12 | Discharge: 2021-01-12 | Disposition: A | Discharge status: Home / Self Care | Payer: 59 | Source: Ambulatory Visit | Attending: Gynecologic Oncology | Admitting: Gynecologic Oncology

## 2021-01-12 ENCOUNTER — Encounter (HOSPITAL_COMMUNITY): Payer: Self-pay

## 2021-01-12 ENCOUNTER — Other Ambulatory Visit: Payer: Self-pay

## 2021-01-12 DIAGNOSIS — Z01812 Encounter for preprocedural laboratory examination: Secondary | ICD-10-CM | POA: Diagnosis present

## 2021-01-12 DIAGNOSIS — Z1509 Genetic susceptibility to other malignant neoplasm: Secondary | ICD-10-CM | POA: Insufficient documentation

## 2021-01-12 DIAGNOSIS — Z1502 Genetic susceptibility to malignant neoplasm of ovary: Secondary | ICD-10-CM | POA: Diagnosis not present

## 2021-01-12 DIAGNOSIS — Z1501 Genetic susceptibility to malignant neoplasm of breast: Secondary | ICD-10-CM | POA: Insufficient documentation

## 2021-01-12 HISTORY — DX: Essential (primary) hypertension: I10

## 2021-01-12 LAB — BASIC METABOLIC PANEL
Anion gap: 10 (ref 5–15)
BUN: 6 mg/dL (ref 6–20)
CO2: 27 mmol/L (ref 22–32)
Calcium: 8.9 mg/dL (ref 8.9–10.3)
Chloride: 102 mmol/L (ref 98–111)
Creatinine, Ser: 0.59 mg/dL (ref 0.44–1.00)
GFR, Estimated: >60 mL/min (ref >60)
Glucose, Bld: 90 mg/dL (ref 70–99)
Potassium: 3.5 mmol/L (ref 3.5–5.1)
Sodium: 139 mmol/L (ref 135–145)

## 2021-01-12 LAB — CBC
HCT: 37.3 % (ref 36.0–46.0)
Hemoglobin: 12.3 g/dL (ref 12.0–15.0)
MCH: 26.1 pg (ref 26.0–34.0)
MCHC: 33 g/dL (ref 30.0–36.0)
MCV: 79.2 fL — ABNORMAL LOW (ref 80.0–100.0)
Platelets: 300 10*3/uL (ref 150–400)
RBC: 4.71 MIL/uL (ref 3.87–5.11)
RDW: 17.9 % — ABNORMAL HIGH (ref 11.5–15.5)
WBC: 8 10*3/uL (ref 4.0–10.5)
nRBC: 0 % (ref 0.0–0.2)

## 2021-01-12 NOTE — Progress Notes (Signed)
COVID Vaccine Completed: Yes Date COVID Vaccine completed: 06/09/20 x 2 COVID vaccine manufacturer: Pfizer  PCP - Dr. Laural Benes Cardiologist -   Chest x-ray -  EKG - 08/09/20 Stress Test -  ECHO - 06/01/20 Cardiac Cath -  Pacemaker/ICD device last checked:  Sleep Study -  CPAP -   Fasting Blood Sugar -  Checks Blood Sugar _____ times a day  Blood Thinner Instructions: Aspirin Instructions: Last Dose:  Anesthesia review:   Patient denies shortness of breath, fever, cough and chest pain at PAT appointment   Patient verbalized understanding of instructions that were given to them at the PAT appointment. Patient was also instructed that they will need to review over the PAT instructions again at home before surgery.

## 2021-01-15 ENCOUNTER — Other Ambulatory Visit: Payer: Self-pay | Admitting: Hematology and Oncology

## 2021-01-15 ENCOUNTER — Ambulatory Visit
Admission: RE | Admit: 2021-01-15 | Discharge: 2021-01-15 | Disposition: A | Discharge status: Home / Self Care | Payer: 59 | Source: Ambulatory Visit | Attending: Radiation Oncology | Admitting: Radiation Oncology

## 2021-01-15 DIAGNOSIS — C50411 Malignant neoplasm of upper-outer quadrant of right female breast: Secondary | ICD-10-CM | POA: Insufficient documentation

## 2021-01-15 DIAGNOSIS — Z171 Estrogen receptor negative status [ER-]: Secondary | ICD-10-CM | POA: Insufficient documentation

## 2021-01-15 NOTE — Progress Notes (Signed)
  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 During treatment she did very well with radiotherapy and did not have significant desquamation. She reports she is doing okay overall. She reports she is still getting over having Covid which she developed just prior to Thanksgiving. She lost her mother around that time and multiple people who came to her funeral ended up being sick. She reports from a breast cancer perspective however she is doing well and while she still has sensitivities of her skin in the right breast, the discoloration and peeling are improved. No symptoms of moist desquamation are verbalized.   Impression/Plan: 1. 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 The patient has been doing well since completion of radiotherapy. We discussed that we would be happy to continue to follow her as needed, but she will also continue to follow up with Dr. [name] in medical oncology. She was counseled on skin care as well as measures to avoid sun exposure to this area.  2. Survivorship. We discussed the importance of survivorship evaluation and encouraged her to attend her upcoming  visit with that clinic. 3. Recent covid and loss of her mother. I offered emotional support and encouraged her to keep [place] informed if she was developing any progressive symptoms.     Carola Rhine, PAC

## 2021-01-16 ENCOUNTER — Telehealth: Payer: Self-pay | Admitting: Neurology

## 2021-01-16 NOTE — Telephone Encounter (Signed)
MD out 12/19 - LVM and sent mychart msg informing pt.

## 2021-01-18 DIAGNOSIS — Z1501 Genetic susceptibility to malignant neoplasm of breast: Secondary | ICD-10-CM

## 2021-01-19 ENCOUNTER — Telehealth: Payer: Self-pay

## 2021-01-19 NOTE — Telephone Encounter (Signed)
Spoke with [name] this afternoon. Advised patient that we heard back from her neurologist she does not need to stop taking her interferon. She can continue taking as prescribed. Patient verbalized understanding. Instructed to call with any needs.

## 2021-01-29 ENCOUNTER — Ambulatory Visit: Payer: 59 | Admitting: Neurology

## 2021-01-29 ENCOUNTER — Telehealth: Payer: Self-pay

## 2021-01-29 NOTE — Anesthesia Preprocedure Evaluation (Addendum)
Anesthesia Evaluation  Patient identified by MRN, date of birth, ID band Patient awake    Reviewed: Allergy & Precautions, NPO status , Patient's Chart, lab work & pertinent test results, reviewed documented beta blocker date and time   Airway Mallampati: II  TM Distance: >3 FB Neck ROM: Full    Dental no notable dental hx. (+) Teeth Intact, Dental Advisory Given   Pulmonary asthma ,    Pulmonary exam normal breath sounds clear to auscultation       Cardiovascular hypertension, Pt. on medications and Pt. on home beta blockers Normal cardiovascular exam Rhythm:Regular Rate:Normal  Echo 05/2020: 1. Left ventricular ejection fraction, by estimation, is 55 to 60%. Left  ventricular ejection fraction by 3D volume is 55 %. The left ventricle has  normal function. The left ventricle has no regional wall motion  abnormalities. Left ventricular diastolic  parameters are consistent with Grade I diastolic dysfunction (impaired  relaxation). The average left ventricular global longitudinal strain is  18.0 %. The global longitudinal strain is normal.  2. Right ventricular systolic function is normal. The right ventricular  size is normal. There is normal pulmonary artery systolic pressure.  3. The mitral valve is normal in structure. Trivial mitral valve  regurgitation. No evidence of mitral stenosis.  4. The aortic valve is tricuspid. Aortic valve regurgitation is not  visualized. No aortic stenosis is present.  5. The inferior vena cava is normal in size with greater than 50%  respiratory variability, suggesting right atrial pressure of 3 mmHg.    Neuro/Psych  Headaches,  Neuromuscular disease (multiple sclerosis )    GI/Hepatic negative GI ROS, Neg liver ROS,   Endo/Other  Obesity BMI 37  Renal/GU negative Renal ROS     Musculoskeletal  (+) Arthritis , Osteoarthritis,    Abdominal (+) + obese,   Peds  Hematology    Anesthesia Other Findings BRCA 2 mutation, hx R breast ca s/p lumpectomy and sentinel LN R side 07/2020  Reproductive/Obstetrics negative OB ROS S/p hysterectomy 2006                             Anesthesia Physical Anesthesia Plan  ASA: 3  Anesthesia Plan: General   Post-op Pain Management: Tylenol PO (pre-op) and Dilaudid IV   Induction: Intravenous  PONV Risk Score and Plan: 4 or greater and Ondansetron, Dexamethasone, Midazolam, Scopolamine patch - Pre-op and Treatment may vary due to age or medical condition  Airway Management Planned: Oral ETT  Additional Equipment: None  Intra-op Plan:   Post-operative Plan: Extubation in OR  Informed Consent: I have reviewed the patients History and Physical, chart, labs and discussed the procedure including the risks, benefits and alternatives for the proposed anesthesia with the patient or authorized representative who has indicated his/her understanding and acceptance.     Dental advisory given  Plan Discussed with: CRNA  Anesthesia Plan Comments:        Anesthesia Quick Evaluation

## 2021-01-29 NOTE — Telephone Encounter (Signed)
Telephone call to check on pre-operative status.  Patient compliant with pre-operative instructions.  Reinforced nothing to eat after midnight and clear liquids until 0800.  No questions or concerns voiced.  Instructed to call for any needs.

## 2021-01-29 NOTE — Telephone Encounter (Signed)
Called Jacqueline Adams to review Pre-Op procedure information. No answer. Left VM to return call to our office.

## 2021-01-30 ENCOUNTER — Ambulatory Visit (HOSPITAL_COMMUNITY): Payer: 59 | Admitting: Anesthesiology

## 2021-01-30 ENCOUNTER — Encounter (HOSPITAL_COMMUNITY)
Admission: RE | Disposition: A | Discharge status: Home / Self Care | Payer: Self-pay | Source: Ambulatory Visit | Attending: Gynecologic Oncology

## 2021-01-30 ENCOUNTER — Ambulatory Visit (HOSPITAL_COMMUNITY): Payer: 59 | Admitting: Physician Assistant

## 2021-01-30 ENCOUNTER — Other Ambulatory Visit: Payer: Self-pay

## 2021-01-30 ENCOUNTER — Encounter (HOSPITAL_COMMUNITY): Payer: Self-pay | Admitting: Gynecologic Oncology

## 2021-01-30 ENCOUNTER — Ambulatory Visit (HOSPITAL_COMMUNITY)
Admission: RE | Admit: 2021-01-30 | Discharge: 2021-01-30 | Disposition: A | Discharge status: Home / Self Care | Payer: 59 | Source: Ambulatory Visit | Attending: Gynecologic Oncology | Admitting: Gynecologic Oncology

## 2021-01-30 DIAGNOSIS — Z171 Estrogen receptor negative status [ER-]: Secondary | ICD-10-CM | POA: Insufficient documentation

## 2021-01-30 DIAGNOSIS — Z148 Genetic carrier of other disease: Secondary | ICD-10-CM | POA: Diagnosis not present

## 2021-01-30 DIAGNOSIS — Z1502 Genetic susceptibility to malignant neoplasm of ovary: Secondary | ICD-10-CM | POA: Diagnosis not present

## 2021-01-30 DIAGNOSIS — G35 Multiple sclerosis: Secondary | ICD-10-CM | POA: Diagnosis not present

## 2021-01-30 DIAGNOSIS — N736 Female pelvic peritoneal adhesions (postinfective): Secondary | ICD-10-CM | POA: Diagnosis not present

## 2021-01-30 DIAGNOSIS — Z6836 Body mass index (BMI) 36.0-36.9, adult: Secondary | ICD-10-CM | POA: Insufficient documentation

## 2021-01-30 DIAGNOSIS — E669 Obesity, unspecified: Secondary | ICD-10-CM | POA: Diagnosis not present

## 2021-01-30 DIAGNOSIS — C50411 Malignant neoplasm of upper-outer quadrant of right female breast: Secondary | ICD-10-CM | POA: Insufficient documentation

## 2021-01-30 DIAGNOSIS — Z1509 Genetic susceptibility to other malignant neoplasm: Secondary | ICD-10-CM | POA: Diagnosis not present

## 2021-01-30 DIAGNOSIS — Z8041 Family history of malignant neoplasm of ovary: Secondary | ICD-10-CM | POA: Diagnosis not present

## 2021-01-30 DIAGNOSIS — D270 Benign neoplasm of right ovary: Secondary | ICD-10-CM | POA: Diagnosis not present

## 2021-01-30 DIAGNOSIS — D271 Benign neoplasm of left ovary: Secondary | ICD-10-CM | POA: Diagnosis not present

## 2021-01-30 DIAGNOSIS — Z923 Personal history of irradiation: Secondary | ICD-10-CM | POA: Diagnosis not present

## 2021-01-30 DIAGNOSIS — Z806 Family history of leukemia: Secondary | ICD-10-CM | POA: Insufficient documentation

## 2021-01-30 DIAGNOSIS — Z1501 Genetic susceptibility to malignant neoplasm of breast: Secondary | ICD-10-CM

## 2021-01-30 DIAGNOSIS — N83201 Unspecified ovarian cyst, right side: Secondary | ICD-10-CM

## 2021-01-30 DIAGNOSIS — Z803 Family history of malignant neoplasm of breast: Secondary | ICD-10-CM | POA: Diagnosis not present

## 2021-01-30 DIAGNOSIS — Z9221 Personal history of antineoplastic chemotherapy: Secondary | ICD-10-CM | POA: Diagnosis not present

## 2021-01-30 DIAGNOSIS — N83202 Unspecified ovarian cyst, left side: Secondary | ICD-10-CM

## 2021-01-30 HISTORY — PX: ROBOTIC ASSISTED BILATERAL SALPINGO OOPHERECTOMY: SHX6078

## 2021-01-30 LAB — TYPE AND SCREEN
ABO/RH(D): O POS
Antibody Screen: NEGATIVE

## 2021-01-30 LAB — ABO/RH: ABO/RH(D): O POS

## 2021-01-30 SURGERY — SALPINGO-OOPHORECTOMY, BILATERAL, ROBOT-ASSISTED
Anesthesia: General | Laterality: Bilateral

## 2021-01-30 MED ORDER — HEMOSTATIC AGENTS (NO CHARGE) OPTIME
TOPICAL | Status: DC | PRN
  Administered 2021-01-30: 1

## 2021-01-30 MED ORDER — OXYCODONE HCL 5 MG PO TABS
ORAL_TABLET | ORAL | Status: AC
  Filled 2021-01-30: qty 1

## 2021-01-30 MED ORDER — ENSURE PRE-SURGERY PO LIQD
296.0000 mL | Freq: Once | ORAL | Status: DC
  Filled 2021-01-30: qty 296

## 2021-01-30 MED ORDER — PHENYLEPHRINE 40 MCG/ML (10ML) SYRINGE FOR IV PUSH (FOR BLOOD PRESSURE SUPPORT)
PREFILLED_SYRINGE | INTRAVENOUS | Status: DC | PRN
  Administered 2021-01-30: 120 ug via INTRAVENOUS

## 2021-01-30 MED ORDER — METOPROLOL SUCCINATE ER 25 MG PO TB24
25.0000 mg | ORAL_TABLET | ORAL | Status: AC
  Administered 2021-01-30: 10:00:00 25 mg via ORAL
  Filled 2021-01-30: qty 1

## 2021-01-30 MED ORDER — MIDAZOLAM HCL 2 MG/2ML IJ SOLN
INTRAMUSCULAR | Status: AC
  Filled 2021-01-30: qty 2

## 2021-01-30 MED ORDER — LIDOCAINE HCL (PF) 2 % IJ SOLN
INTRAMUSCULAR | Status: AC
  Filled 2021-01-30: qty 5

## 2021-01-30 MED ORDER — PHENYLEPHRINE 40 MCG/ML (10ML) SYRINGE FOR IV PUSH (FOR BLOOD PRESSURE SUPPORT)
PREFILLED_SYRINGE | INTRAVENOUS | Status: AC
  Filled 2021-01-30: qty 10

## 2021-01-30 MED ORDER — FENTANYL CITRATE (PF) 250 MCG/5ML IJ SOLN
INTRAMUSCULAR | Status: AC
  Filled 2021-01-30: qty 5

## 2021-01-30 MED ORDER — ORAL CARE MOUTH RINSE: 15.0000 mL | Freq: Once | OROMUCOSAL | Status: AC

## 2021-01-30 MED ORDER — DEXAMETHASONE SODIUM PHOSPHATE 4 MG/ML IJ SOLN
INTRAMUSCULAR | Status: DC | PRN
  Administered 2021-01-30: 4 mg via INTRAVENOUS

## 2021-01-30 MED ORDER — MIDAZOLAM HCL 5 MG/5ML IJ SOLN
INTRAMUSCULAR | Status: DC | PRN
  Administered 2021-01-30: 2 mg via INTRAVENOUS

## 2021-01-30 MED ORDER — LIDOCAINE 2% (20 MG/ML) 5 ML SYRINGE
INTRAMUSCULAR | Status: DC | PRN
Start: 2021-01-30 — End: 2021-01-30
  Administered 2021-01-30: 80 mg via INTRAVENOUS

## 2021-01-30 MED ORDER — DEXAMETHASONE SODIUM PHOSPHATE 4 MG/ML IJ SOLN: 4.0000 mg | INTRAMUSCULAR | Status: DC

## 2021-01-30 MED ORDER — GABAPENTIN 300 MG PO CAPS
300.0000 mg | ORAL_CAPSULE | ORAL | Status: AC
  Administered 2021-01-30: 10:00:00 300 mg via ORAL
  Filled 2021-01-30: qty 1

## 2021-01-30 MED ORDER — BUPIVACAINE HCL 0.25 % IJ SOLN
INTRAMUSCULAR | Status: DC | PRN
  Administered 2021-01-30: 44 mL

## 2021-01-30 MED ORDER — PROPOFOL 500 MG/50ML IV EMUL
INTRAVENOUS | Status: DC | PRN
  Administered 2021-01-30: 25 ug/kg/min via INTRAVENOUS

## 2021-01-30 MED ORDER — AMISULPRIDE (ANTIEMETIC) 5 MG/2ML IV SOLN
INTRAVENOUS | Status: AC
  Filled 2021-01-30: qty 4

## 2021-01-30 MED ORDER — ONDANSETRON HCL 4 MG/2ML IJ SOLN
INTRAMUSCULAR | Status: AC
  Filled 2021-01-30: qty 2

## 2021-01-30 MED ORDER — ROCURONIUM BROMIDE 10 MG/ML (PF) SYRINGE
PREFILLED_SYRINGE | INTRAVENOUS | Status: DC | PRN
  Administered 2021-01-30: 100 mg via INTRAVENOUS

## 2021-01-30 MED ORDER — HEPARIN SODIUM (PORCINE) 5000 UNIT/ML IJ SOLN
5000.0000 [IU] | INTRAMUSCULAR | Status: AC
  Administered 2021-01-30: 10:00:00 5000 [IU] via SUBCUTANEOUS
  Filled 2021-01-30: qty 1

## 2021-01-30 MED ORDER — ACETAMINOPHEN 500 MG PO TABS
1000.0000 mg | ORAL_TABLET | ORAL | Status: AC
  Administered 2021-01-30: 10:00:00 1000 mg via ORAL
  Filled 2021-01-30: qty 2

## 2021-01-30 MED ORDER — ONDANSETRON HCL 4 MG/2ML IJ SOLN
INTRAMUSCULAR | Status: DC | PRN
  Administered 2021-01-30: 4 mg via INTRAVENOUS

## 2021-01-30 MED ORDER — AMISULPRIDE (ANTIEMETIC) 5 MG/2ML IV SOLN
10.0000 mg | Freq: Once | INTRAVENOUS | Status: AC | PRN
  Administered 2021-01-30: 15:00:00 10 mg via INTRAVENOUS

## 2021-01-30 MED ORDER — SCOPOLAMINE 1 MG/3DAYS TD PT72
1.0000 | MEDICATED_PATCH | TRANSDERMAL | Status: DC
  Administered 2021-01-30: 10:00:00 1.5 mg via TRANSDERMAL
  Filled 2021-01-30: qty 1

## 2021-01-30 MED ORDER — OXYCODONE HCL 5 MG PO TABS
5.0000 mg | ORAL_TABLET | Freq: Once | ORAL | Status: AC | PRN
  Administered 2021-01-30: 14:00:00 5 mg via ORAL

## 2021-01-30 MED ORDER — OXYCODONE HCL 5 MG/5ML PO SOLN: 5.0000 mg | Freq: Once | ORAL | Status: AC | PRN

## 2021-01-30 MED ORDER — DEXAMETHASONE SODIUM PHOSPHATE 10 MG/ML IJ SOLN
INTRAMUSCULAR | Status: AC
  Filled 2021-01-30: qty 1

## 2021-01-30 MED ORDER — HYDROMORPHONE HCL 2 MG/ML IJ SOLN
INTRAMUSCULAR | Status: AC
  Filled 2021-01-30: qty 1

## 2021-01-30 MED ORDER — ONDANSETRON HCL 4 MG/2ML IJ SOLN: 4.0000 mg | Freq: Once | INTRAMUSCULAR | Status: DC | PRN

## 2021-01-30 MED ORDER — PROPOFOL 1000 MG/100ML IV EMUL
INTRAVENOUS | Status: AC
  Filled 2021-01-30: qty 100

## 2021-01-30 MED ORDER — ROCURONIUM BROMIDE 10 MG/ML (PF) SYRINGE
PREFILLED_SYRINGE | INTRAVENOUS | Status: AC
  Filled 2021-01-30: qty 10

## 2021-01-30 MED ORDER — FENTANYL CITRATE (PF) 100 MCG/2ML IJ SOLN
INTRAMUSCULAR | Status: DC | PRN
  Administered 2021-01-30: 100 ug via INTRAVENOUS
  Administered 2021-01-30 (×3): 50 ug via INTRAVENOUS

## 2021-01-30 MED ORDER — LACTATED RINGERS IV SOLN: INTRAVENOUS | Status: DC | PRN

## 2021-01-30 MED ORDER — HYDROMORPHONE HCL 1 MG/ML IJ SOLN: 0.2500 mg | INTRAMUSCULAR | Status: DC | PRN

## 2021-01-30 MED ORDER — STERILE WATER FOR IRRIGATION IR SOLN
Status: DC | PRN
  Administered 2021-01-30: 1000 mL

## 2021-01-30 MED ORDER — SUGAMMADEX SODIUM 200 MG/2ML IV SOLN
INTRAVENOUS | Status: DC | PRN
  Administered 2021-01-30: 200 mg via INTRAVENOUS

## 2021-01-30 MED ORDER — CHLORHEXIDINE GLUCONATE 0.12 % MT SOLN
15.0000 mL | Freq: Once | OROMUCOSAL | Status: AC
  Administered 2021-01-30: 10:00:00 15 mL via OROMUCOSAL

## 2021-01-30 MED ORDER — LACTATED RINGERS IV SOLN: INTRAVENOUS | Status: DC

## 2021-01-30 MED ORDER — HYDROMORPHONE HCL 1 MG/ML IJ SOLN
INTRAMUSCULAR | Status: DC | PRN
  Administered 2021-01-30 (×2): 1 mg via INTRAVENOUS

## 2021-01-30 MED ORDER — ENSURE PRE-SURGERY PO LIQD
592.0000 mL | Freq: Once | ORAL | Status: DC
  Filled 2021-01-30: qty 592

## 2021-01-30 MED ORDER — PROPOFOL 10 MG/ML IV BOLUS
INTRAVENOUS | Status: DC | PRN
  Administered 2021-01-30: 50 mg via INTRAVENOUS
  Administered 2021-01-30: 150 mg via INTRAVENOUS

## 2021-01-30 MED ORDER — BUPIVACAINE HCL 0.25 % IJ SOLN
INTRAMUSCULAR | Status: AC
  Filled 2021-01-30: qty 1

## 2021-01-30 MED ORDER — LACTATED RINGERS IR SOLN
Status: DC | PRN
  Administered 2021-01-30: 1000 mL

## 2021-01-30 SURGICAL SUPPLY — 78 items
ADH SKN CLS APL DERMABOND .7 (GAUZE/BANDAGES/DRESSINGS) ×1
AGENT HMST KT MTR STRL THRMB (HEMOSTASIS) ×1
APL ESCP 34 STRL LF DISP (HEMOSTASIS) ×1
APL SRG 38 LTWT LNG FL B (MISCELLANEOUS) ×1
APPLICATOR ARISTA FLEXITIP XL (MISCELLANEOUS) ×2 IMPLANT
APPLICATOR SURGIFLO ENDO (HEMOSTASIS) ×2 IMPLANT
BAG COUNTER SPONGE SURGICOUNT (BAG) IMPLANT
BAG LAPAROSCOPIC 12 15 PORT 16 (BASKET) IMPLANT
BAG RETRIEVAL 12/15 (BASKET)
BAG RETRIEVAL 12/15MM (BASKET)
BAG SPEC RTRVL LRG 6X4 10 (ENDOMECHANICALS)
BAG SPNG CNTER NS LX DISP (BAG)
BAG SURGICOUNT SPONGE COUNTING (BAG)
BLADE SURG SZ10 CARB STEEL (BLADE) IMPLANT
COVER BACK TABLE 60X90IN (DRAPES) ×3 IMPLANT
COVER TIP SHEARS 8 DVNC (MISCELLANEOUS) ×1 IMPLANT
COVER TIP SHEARS 8MM DA VINCI (MISCELLANEOUS) ×3
DECANTER SPIKE VIAL GLASS SM (MISCELLANEOUS) ×3 IMPLANT
DERMABOND ADVANCED (GAUZE/BANDAGES/DRESSINGS) ×2
DERMABOND ADVANCED .7 DNX12 (GAUZE/BANDAGES/DRESSINGS) ×1 IMPLANT
DRAPE ARM DVNC X/XI (DISPOSABLE) ×4 IMPLANT
DRAPE COLUMN DVNC XI (DISPOSABLE) ×1 IMPLANT
DRAPE DA VINCI XI ARM (DISPOSABLE) ×12
DRAPE DA VINCI XI COLUMN (DISPOSABLE) ×3
DRAPE SHEET LG 3/4 BI-LAMINATE (DRAPES) ×3 IMPLANT
DRAPE SURG IRRIG POUCH 19X23 (DRAPES) ×3 IMPLANT
DRSG OPSITE POSTOP 4X6 (GAUZE/BANDAGES/DRESSINGS) IMPLANT
DRSG OPSITE POSTOP 4X8 (GAUZE/BANDAGES/DRESSINGS) IMPLANT
ELECT PENCIL ROCKER SW 15FT (MISCELLANEOUS) IMPLANT
ELECT REM PT RETURN 15FT ADLT (MISCELLANEOUS) ×3 IMPLANT
GAUZE 4X4 16PLY ~~LOC~~+RFID DBL (SPONGE) ×3 IMPLANT
GLOVE SURG ENC MOIS LTX SZ6 (GLOVE) ×12 IMPLANT
GLOVE SURG ENC MOIS LTX SZ6.5 (GLOVE) ×6 IMPLANT
GOWN STRL REUS W/ TWL LRG LVL3 (GOWN DISPOSABLE) ×4 IMPLANT
GOWN STRL REUS W/TWL LRG LVL3 (GOWN DISPOSABLE) ×12
HEMOSTAT ARISTA ABSORB 3G PWDR (HEMOSTASIS) ×2 IMPLANT
HOLDER FOLEY CATH W/STRAP (MISCELLANEOUS) IMPLANT
IRRIG SUCT STRYKERFLOW 2 WTIP (MISCELLANEOUS) ×3
IRRIGATION SUCT STRKRFLW 2 WTP (MISCELLANEOUS) ×1 IMPLANT
KIT PROCEDURE DA VINCI SI (MISCELLANEOUS)
KIT PROCEDURE DVNC SI (MISCELLANEOUS) IMPLANT
KIT TURNOVER KIT A (KITS) IMPLANT
MANIPULATOR ADVINCU DEL 3.0 PL (MISCELLANEOUS) IMPLANT
MANIPULATOR ADVINCU DEL 3.5 PL (MISCELLANEOUS) IMPLANT
MANIPULATOR UTERINE 4.5 ZUMI (MISCELLANEOUS) IMPLANT
NDL HYPO 21X1.5 SAFETY (NEEDLE) ×1 IMPLANT
NDL SPNL 18GX3.5 QUINCKE PK (NEEDLE) IMPLANT
NEEDLE HYPO 21X1.5 SAFETY (NEEDLE) ×3 IMPLANT
NEEDLE SPNL 18GX3.5 QUINCKE PK (NEEDLE) IMPLANT
OBTURATOR OPTICAL STANDARD 8MM (TROCAR) ×3
OBTURATOR OPTICAL STND 8 DVNC (TROCAR) ×1
OBTURATOR OPTICALSTD 8 DVNC (TROCAR) ×1 IMPLANT
PACK ROBOT GYN CUSTOM WL (TRAY / TRAY PROCEDURE) ×3 IMPLANT
PAD POSITIONING PINK XL (MISCELLANEOUS) ×3 IMPLANT
PORT ACCESS TROCAR AIRSEAL 12 (TROCAR) ×1 IMPLANT
PORT ACCESS TROCAR AIRSEAL 5M (TROCAR) ×2
POUCH SPECIMEN RETRIEVAL 10MM (ENDOMECHANICALS) IMPLANT
SCRUB EXIDINE 4% CHG 4OZ (MISCELLANEOUS) ×3 IMPLANT
SEAL CANN UNIV 5-8 DVNC XI (MISCELLANEOUS) ×4 IMPLANT
SEAL XI 5MM-8MM UNIVERSAL (MISCELLANEOUS) ×12
SET TRI-LUMEN FLTR TB AIRSEAL (TUBING) ×3 IMPLANT
SPONGE T-LAP 18X18 ~~LOC~~+RFID (SPONGE) IMPLANT
SURGIFLO W/THROMBIN 8M KIT (HEMOSTASIS) ×2 IMPLANT
SUT MNCRL AB 4-0 PS2 18 (SUTURE) IMPLANT
SUT PDS AB 1 TP1 96 (SUTURE) IMPLANT
SUT VIC AB 0 CT1 27 (SUTURE)
SUT VIC AB 0 CT1 27XBRD ANTBC (SUTURE) IMPLANT
SUT VIC AB 2-0 CT1 27 (SUTURE)
SUT VIC AB 2-0 CT1 TAPERPNT 27 (SUTURE) IMPLANT
SUT VIC AB 4-0 PS2 18 (SUTURE) ×6 IMPLANT
SYR 10ML LL (SYRINGE) IMPLANT
TOWEL OR NON WOVEN STRL DISP B (DISPOSABLE) IMPLANT
TRAP SPECIMEN MUCUS 40CC (MISCELLANEOUS) IMPLANT
TRAY FOLEY MTR SLVR 16FR STAT (SET/KITS/TRAYS/PACK) ×3 IMPLANT
TROCAR XCEL NON-BLD 5MMX100MML (ENDOMECHANICALS) IMPLANT
UNDERPAD 30X36 HEAVY ABSORB (UNDERPADS AND DIAPERS) ×6 IMPLANT
WATER STERILE IRR 1000ML POUR (IV SOLUTION) ×3 IMPLANT
YANKAUER SUCT BULB TIP 10FT TU (MISCELLANEOUS) IMPLANT

## 2021-01-30 NOTE — Transfer of Care (Signed)
Immediate Anesthesia Transfer of Care Note  Patient: Jacqueline Adams  Procedure(s) Performed: XI ROBOTIC ASSISTED BILATERAL SALPINGO OOPHORECTOMY, LYSIS OF ADHESIONS, 1 HOUR (Bilateral)  Patient Location: PACU  Anesthesia Type:General  Level of Consciousness: awake and patient cooperative  Airway & Oxygen Therapy: Patient Spontanous Breathing and Patient connected to face mask  Post-op Assessment: Report given to RN and Post -op Vital signs reviewed and stable  Post vital signs: Reviewed and stable  Last Vitals:  Vitals Value Taken Time  BP 122/69 01/30/21 1335  Temp    Pulse 82 01/30/21 1337  Resp 19 01/30/21 1337  SpO2 99 % 01/30/21 1337  Vitals shown include unvalidated device data.  Last Pain:  Vitals:   01/30/21 0920  PainSc: 0-No pain         Complications: No notable events documented.

## 2021-01-30 NOTE — Op Note (Signed)
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   Anesthesia: GET  Urine Output: 700cc  Operative Findings: On exam under anesthesia, surgically absent cervix and uterus, mild fullness in the cul-de-sac.  On intra-abdominal entry, normal upper abdominal survey.  Adhesions of multiple loops of small bowel and omentum to the anterior abdominal wall in the mid lower abdomen.  Sigmoid mesentery and epiploica adherent within the pelvis along the left pelvic sidewall and over the bladder peritoneum and vaginal cuff.  Right ovary with a simple appearing cystic 4 cm lesion, somewhat retroperitonealized, adherent to multiple areas of sigmoid epiploica along the medial aspect.  Mass adherent to the bladder peritoneum.  On the left, ovary replaced by a 6 cm simple appearing cyst that is almost completely retroperitonealized with sigmoid: Adherent superior to the mass.  No tubal segments identified.  No ascites.  No intra-abdominal or pelvic evidence of metastatic disease.  Purposeful drainage of the left cystic lesion to aid in its removal with note of clear cystic fluid.  Estimated Blood Loss:  100cc      Total IV Fluids: see I&O flowsheet         Specimens: bilateral ovaries, pelvic washings         Complications:  None apparent; patient tolerated the procedure well.         Disposition: PACU - hemodynamically stable.  Procedure  Details  The patient was seen in the Holding Room. The risks, benefits, complications, treatment options, and expected outcomes were discussed with the patient.  The patient concurred with the proposed plan, giving informed consent.  The site of surgery properly noted/marked. The patient was identified as Jacqueline Adams and the procedure verified as a Robotic-assisted bilateral salpingo-oophorectomy with any other indicated procedures.   After induction of anesthesia, the patient was draped and prepped in the usual sterile manner. Patient was placed in supine position after anesthesia and draped and prepped in the usual sterile manner as follows: Her arms were tucked to her side with all appropriate precautions.  The shoulders were stabilized with padded shoulder blocks applied to the acromium processes.  The patient was placed in the semi-lithotomy position in [place].  The perineum and vagina were prepped with CholoraPrep. The patient was draped after the CholoraPrep had been allowed to dry for 3 minutes.  A Time Out was held and the above information confirmed.  The urethra was prepped with Betadine. Foley catheter was placed.  A spongestick was placed in the vagina.  A pneumo-occluder balloon was placed over the manipulator.  OG tube placement was confirmed and to suction.   Next, a 10 mm skin incision was made 1 cm below the subcostal margin in the midclavicular line.  The 5 mm Optiview port and scope was used for direct entry.  Opening pressure was under 10 mm CO2.  The abdomen was insufflated and the findings were noted as above.   At this point and all points during the procedure, the patient's intra-abdominal pressure did not exceed 15 mmHg. Next, an 8 mm skin incision  was made superior to the umbilicus and a right and left port were placed about 8 cm lateral to the robot port on the right and left side.  A fourth arm was placed on the right.  The 5 mm assist trocar was exchanged for a 10-12  mm port. All ports were placed under direct visualization.  The patient was placed in steep Trendelenburg.  The robot was docked in the normal manner.  Attention was first turned anteriorly.  Sharp dissection was used to lyse adhesions of the omentum and several loops of small bowel to the anterior abdominal wall, freeing these adhesions.  During this dissection, there was concern for small area of disruption of the serosa of 1 loop of small bowel.  This area was oversewed with 2 interrupted sutures of 2-0 Vicryl.  Pelvic washings were then collected.  The right peritoneum was then opened parallel to the IP ligament and the retroperitoneal space was dissected.  The ureter was noted along the medial leaf of the broad ligament.  Peritoneum above the ureter was incised and stretched, and the infundibulopelvic ligament was skeletonized, cauterized, and cut, as close to 2 cm superior to the aspect of the ovary as possible.  Sharp dissection was then used to free multiple areas of sigmoid epiploica that were adherent to the ovary.  With continuous manipulation of the position of the ovary, combination of blunt dissection, sharp dissection, and short bursts of electrocautery were used to lyse adhesions of the ovary to the retroperitoneum and bladder peritoneum, ultimately freeing the right ovary.  This was placed in an Endo Catch bag.  Attention was then turned to the left.  Sharp dissection was used to lyse the adhesions of the sigmoid epiploica to the left abdominal sidewall.  The left peritoneum was opened parallel to the IP ligament to open the retroperitoneal space.  Combination of sharp dissection and blunt dissection was then used to slowly and meticulously lyse adhesions of the sigmoid to the underlying retroperitonealized ovarian mass.  The infundibulopelvic ligament was ultimately identified.  This was skeletonized, cauterized, and cut, trying to achieve at least a 1-2 cm margin from the ovarian mass.   Given difficulty visualizing the medial aspect of the sigmoid over the mass given the size of the mass and due to its benign appearance, decision was made to intentionally rupture the cystic mass.  The course of the ureter was then easily identified inferior to the mass and dropped inferiorly and laterally.  The cyst and ovary were then dissected free from the remaining attachments to the sigmoid mesentery and epiploica, left pelvic sidewall, and bladder peritoneum using accommodation of blunt dissection, sharp dissection, and short bursts of electrocautery.  The left adnexa, once freed, was then also placed in an Endo Catch bag.  A small, less than 1 cm, hematoma was noted along one of the epiploica of the sigmoid colon.  This was stable in size over the course of the surgery.  With the abdomen still insufflated and instruments removed, the Endo Catch bags were brought through the assist trocar.  The still intact right ovarian cyst was decompressed and removed.  The left ovarian cyst was removed in piecemeal form through the assist trocar within the Endo Catch bag.  Under direct visualization, robotic instruments were reintroduced into the abdomen.  Irrigation was used and excellent hemostasis was achieved.  Given degree of dissection and lysis of adhesions, Surgiflo was placed in the operative beds.  At this point in the procedure  was completed.  Robotic instruments were removed under direct visulaization.  The robot was undocked. The fascia at the 10-12 mm port was closed with 0 Vicryl on a UR-5 needle.  The subcuticular tissue was closed with 4-0 Vicryl and the skin was closed with 4-0 Monocryl in a subcuticular manner.  Dermabond was applied.    The spongestick was removed from the vagina. Foley catheter was removed.  All sponge, lap and needle counts were correct x  3.   The patient was transferred to the recovery room in stable condition.  Jeral Pinch, MD

## 2021-01-30 NOTE — Anesthesia Procedure Notes (Signed)
Procedure Name: Intubation Date/Time: 01/30/2021 10:34 AM Performed by: Claudia Desanctis, CRNA Pre-anesthesia Checklist: Patient identified, Emergency Drugs available, Suction available and Patient being monitored Patient Re-evaluated:Patient Re-evaluated prior to induction Oxygen Delivery Method: Circle system utilized Preoxygenation: Pre-oxygenation with 100% oxygen Induction Type: IV induction Ventilation: Mask ventilation without difficulty Laryngoscope Size: 2 and Miller Grade View: Grade I Tube type: Oral Tube size: 7.0 mm Number of attempts: 1 Airway Equipment and Method: Stylet Placement Confirmation: ETT inserted through vocal cords under direct vision, positive ETCO2 and breath sounds checked- equal and bilateral Secured at: 21 cm Tube secured with: Tape Dental Injury: Teeth and Oropharynx as per pre-operative assessment

## 2021-01-30 NOTE — Interval H&P Note (Signed)
History and Physical Interval Note:  01/30/2021 9:42 AM  Jacqueline Adams  has presented today for surgery, with the diagnosis of BRCA 2 MUTATION.  The various methods of treatment have been discussed with the patient and family. After consideration of risks, benefits and other options for treatment, the patient has consented to  Procedure(s): XI ROBOTIC ASSISTED BILATERAL SALPINGO OOPHORECTOMY AND REMOVAL OF ANY REMAINING FALLOPIAN TUBES ANDPOSSIBLE LAPAROTOMY AND POSSIBLE STAGING (Bilateral) as a surgical intervention.  The patient's history has been reviewed, patient examined, no change in status, stable for surgery.  I have reviewed the patient's chart and labs.  Questions were answered to the patient's satisfaction.     Lafonda Mosses

## 2021-01-30 NOTE — Discharge Instructions (Addendum)
°  Medications:  °- Take ibuprofen and tylenol first line for pain control. Take these regularly (every 6 hours) to decrease the build up of pain. ° °- If necessary, for severe pain not relieved by ibuprofen, take tramadol. ° °- While taking tramadol you should take sennakot every night to reduce the likelihood of constipation. If this causes diarrhea, stop its use. ° °Diet: °1. Low sodium Heart Healthy Diet is recommended. ° °2. It is safe to use a laxative if you have difficulty moving your bowels.  ° °Wound Care: °1. Keep clean and dry.  Shower daily. ° °Reasons to call the Doctor: ° °Fever - Oral temperature greater than 100.4 degrees Fahrenheit °Foul-smelling vaginal discharge °Difficulty urinating °Nausea and vomiting °Increased pain at the site of the incision that is unrelieved with pain medicine. °Difficulty breathing with or without chest pain °New calf pain especially if only on one side °Sudden, continuing increased vaginal bleeding with or without clots. °  °Follow-up: °1. See Katherine Tucker in 3 weeks. You will have a phone visit once pathology is back. ° °Contacts: °For questions or concerns you should contact: ° °Dr. Katherine Tucker at [phone] °After hours and on week-ends call [phone] and ask to speak to the physician on call for Gynecologic Oncology  °

## 2021-01-31 ENCOUNTER — Telehealth: Payer: Self-pay

## 2021-01-31 ENCOUNTER — Encounter (HOSPITAL_COMMUNITY): Payer: Self-pay | Admitting: Gynecologic Oncology

## 2021-01-31 LAB — SURGICAL PATHOLOGY

## 2021-01-31 LAB — CYTOLOGY - NON PAP

## 2021-01-31 NOTE — Telephone Encounter (Signed)
Spoke with Jacqueline Adams this morning. She states she is eating, drinking and urinating well. She has not had a BM yet but is passing gas. She is taking senokot as prescribed and encouraged her to drink plenty of water. She denies fever or chills. Incisions are dry and intact. She rates her pain 5-6/10. She states she is sore/tender and has twinges of pain that come and go. Her pain is controlled with alternating tylenol and ibuprofen. She is using tramadol as needed.   Instructed to call office with any fever, chills, purulent drainage, uncontrolled pain or any other questions or concerns. Patient verbalizes understanding.   Pt aware of post op appointments as well as the office number [phone] and after hours number [phone] to call if she has any questions or concerns

## 2021-01-31 NOTE — Anesthesia Postprocedure Evaluation (Signed)
Anesthesia Post Note  Patient: [name]  Procedure(s) Performed: XI ROBOTIC ASSISTED BILATERAL SALPINGO OOPHORECTOMY, LYSIS OF ADHESIONS, 1 HOUR (Bilateral)     Patient location during evaluation: PACU Anesthesia Type: General Level of consciousness: awake and alert, oriented and patient cooperative Pain management: pain level controlled Vital Signs Assessment: post-procedure vital signs reviewed and stable Respiratory status: spontaneous breathing, nonlabored ventilation and respiratory function stable Cardiovascular status: blood pressure returned to baseline and stable Postop Assessment: no apparent nausea or vomiting Anesthetic complications: no   No notable events documented.  Last Vitals:  Vitals:   01/30/21 1415 01/30/21 1500  BP: 121/65 133/80  Pulse: 69 90  Resp: (!) 23 20  Temp: 36.7 C 36.7 C  SpO2: 96% 95%    Last Pain:  Vitals:   01/30/21 1500  PainSc: 0-No pain                 Jarome Matin Angelly Spearing

## 2021-02-02 ENCOUNTER — Telehealth: Payer: Self-pay | Admitting: *Deleted

## 2021-02-02 NOTE — Telephone Encounter (Signed)
PC to patient, informed her that her pathology report is back, shows no evidence of malignancy.  Patient verbalizes understanding.

## 2021-02-06 ENCOUNTER — Telehealth: Payer: Self-pay

## 2021-02-06 ENCOUNTER — Ambulatory Visit (INDEPENDENT_AMBULATORY_CARE_PROVIDER_SITE_OTHER): Payer: 59 | Admitting: Neurology

## 2021-02-06 ENCOUNTER — Encounter: Payer: Self-pay | Admitting: Neurology

## 2021-02-06 ENCOUNTER — Inpatient Hospital Stay: Payer: 59 | Attending: Hematology and Oncology

## 2021-02-06 ENCOUNTER — Other Ambulatory Visit: Payer: Self-pay | Admitting: Gynecologic Oncology

## 2021-02-06 ENCOUNTER — Other Ambulatory Visit: Payer: Self-pay

## 2021-02-06 VITALS — BP 128/74 | HR 127 | Ht 64.0 in | Wt 211.5 lb

## 2021-02-06 DIAGNOSIS — G35 Multiple sclerosis: Secondary | ICD-10-CM

## 2021-02-06 DIAGNOSIS — R200 Anesthesia of skin: Secondary | ICD-10-CM | POA: Diagnosis not present

## 2021-02-06 DIAGNOSIS — R399 Unspecified symptoms and signs involving the genitourinary system: Secondary | ICD-10-CM

## 2021-02-06 DIAGNOSIS — Z1502 Genetic susceptibility to malignant neoplasm of ovary: Secondary | ICD-10-CM | POA: Diagnosis not present

## 2021-02-06 DIAGNOSIS — C50411 Malignant neoplasm of upper-outer quadrant of right female breast: Secondary | ICD-10-CM | POA: Diagnosis not present

## 2021-02-06 DIAGNOSIS — G43009 Migraine without aura, not intractable, without status migrainosus: Secondary | ICD-10-CM | POA: Diagnosis not present

## 2021-02-06 DIAGNOSIS — R208 Other disturbances of skin sensation: Secondary | ICD-10-CM

## 2021-02-06 DIAGNOSIS — R350 Frequency of micturition: Secondary | ICD-10-CM

## 2021-02-06 LAB — URINALYSIS, COMPLETE (UACMP) WITH MICROSCOPIC
Bilirubin Urine: NEGATIVE
Glucose, UA: NEGATIVE mg/dL
Ketones, ur: NEGATIVE mg/dL
Nitrite: NEGATIVE
Protein, ur: 30 mg/dL — AB
Specific Gravity, Urine: 1.02 (ref 1.005–1.030)
pH: 6 (ref 5.0–8.0)

## 2021-02-06 MED ORDER — NITROFURANTOIN MONOHYD MACRO 100 MG PO CAPS: 100.0000 mg | ORAL_CAPSULE | Freq: Two times a day (BID) | ORAL | 0 refills | Status: AC

## 2021-02-06 NOTE — Progress Notes (Signed)
[facility]  PATIENT: Jacqueline Adams  REFERRING DOCTOR OR PCP:  Rolan Lipa  _________________________________   HISTORICAL  CHIEF COMPLAINT:  Chief Complaint  Patient presents with   Follow-up    Rm 2, alone. Here for 6 month MS f/u, on Avonex and tolerating well. Pt doing well, no new sx. Pt had her fallopian tubes removed on 01/30/21.     HISTORY OF PRESENT ILLNESS:   Jacqueline Adams is a 60 y.o. woman with relapsing remitting MS diagnosed in 2009.    Update 02/06/2021: She is on Avonex and tolerates it well.    She is getting the drug through an assistance.  She tolerates it well.  No injection reactios   Gait is doing well.  No falls.   She can go downstairs without the banister.   She denies weakness or spasticity in her legs.  She has sciatica type pain shooting pain in her right leg at times but this is not worsening and not bad enough to treat.   Bladder is doing well.   Vision is fine.    She denies much fatigue.   Her sleep pattern is better since she stopped chemo.    She is walking some everyday.   She denies depression.  Cognition is doing well.    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    She was carboplatin and paclitaxel.   She gets shooting pain in her right hand/arm at times only.    She finished radiation 12/2020.    She had Covid-19 early December 2022  .  She had bilateral oophrectomy and lysis of adhesions surgery last week.   She did well and is recovering nice.y  She gets blood work regularly last 12/13/2020   CBC showed normal WBC and lymphocyte count.    CMP showed normal LFT   MS History:  In 2009, she had the onset of right arm and leg tingling and clumsiness. At the time, she was seeing Dr. Earley Favor for migraines and he performed a nerve conduction study only showing mild carpal tunnel syndrome. Symptoms worsened the next day and she was referred to Dr. Erling Cruz. An  MRI of the brain, cervical spine and a lumbar puncture was performed. The brain MRI showed a large left frontoparietal periventricular focus and several other foci. CSF was consistent with multiple sclerosis.  MRI of the cervical spine was normal. She was started on Avonex.   She was switched to Tecfidera in late 2014 as she had one new focus on her 2014 MRI. She switched back to Avonex in 2016..  Imaging MRI of the brain 04/29/2018 showed T2/FLAIR hyperintense foci in the hemispheres in a pattern consistent with demyelination/MS.  None of the foci were acute and there were no new lesions compared to the MRI from 2018.  MRI of the brain 05/22/2016 showed T2/FLAIR foci in the hemispheres consistent with MS.  There were no acute findings.  No new lesions compared to 05/14/2015  MRI of the cervical spine 05/22/2016 showed a normal spinal cord and mild disc degenerative changes but no nerve root compression or spinal stenosis.  MRO of the brain 09/19/2020 showed Several T2/FLAIR hyperintense foci in the left hemisphere in a pattern consistent with chronic demyelinating plaque associated with multiple sclerosis.  None of the foci enhance.  Compared to the MRI from 04/29/2018, there are no new lesions.  MRI cervical spine showed a normal spinal cord  and DJD but no spinal stenosis or nerve root compression  REVIEW OF SYSTEMS: Constitutional: No fevers, chills, sweats, or change in appetite.   She has fatigue Eyes: No visual changes, double vision, eye pain.   Being followed for borderline high intraocular pressure. Ear, nose and throat: No hearing loss, ear pain/.    Respiratory:  No shortness of breath at rest or with exertion.   No wheezes.   She has frequent bronchitis. Cardiovascular: No chest pain, palpitations GastrointestinaI: No nausea, vomiting, diarrhea, abdominal pain, fecal incontinence Genitourinary:  No dysuria.  She has urgency and or frequency.  2-3 x  nocturia. Musculoskeletal:  No neck pain,  back pain Integumentary: No rash, pruritus, skin lesions Neurological: as above Psychiatric: No depression at this time.  No anxiety Endocrine: No palpitations, diaphoresis, change in appetite, change in weigh or increased thirst Hematologic/Lymphatic:  No anemia, purpura, petechiae. Allergic/Immunologic: No itchy/runny eyes, nasal congestion, recent allergic reactions, rashes  ALLERGIES: Allergies  Allergen Reactions   Nabumetone Swelling and Hives   Procaine Swelling   Shellfish Allergy Anaphylaxis    HOME MEDICATIONS:  Current Outpatient Medications:    Calcium Carb-Cholecalciferol (CALCIUM 600+D3 PO), Take 1 tablet by mouth in the morning and at bedtime., Disp: , Rfl:    Cholecalciferol (VITAMIN D3) 25 MCG (1000 UT) CAPS, Take 1,000 Units by mouth in the morning., Disp: , Rfl:    Ferrous Sulfate Dried (SLOW RELEASE IRON) 45 MG TBCR, Take 45 mg by mouth in the morning., Disp: , Rfl:    fexofenadine (ALLEGRA) 180 MG tablet, Take 180 mg by mouth daily as needed for allergies., Disp: , Rfl:    fluticasone (FLONASE) 50 MCG/ACT nasal spray, Place 2 sprays into both nostrils daily. (Patient taking differently: Place 2 sprays into both nostrils daily as needed for allergies.), Disp: 16 g, Rfl: 2   ibuprofen (ADVIL) 200 MG tablet, Take 400 mg by mouth every Friday. W/Avonex injection, Disp: , Rfl:    ibuprofen (ADVIL) 800 MG tablet, Take 1 tablet (800 mg total) by mouth every 8 (eight) hours as needed for moderate pain. For AFTER surgery only, Disp: 30 tablet, Rfl: 0   interferon beta-1a (AVONEX PREFILLED) 30 MCG/0.5ML PSKT injection, INJECT ONE SYRINGE INTRAMUSCULARLY ONCE WEEKLY. REFRIGERATE. PROTECT FROM LIGHT. ALLOW TO WARM TO ROOM TEMPERATURE PRIOR TO USE. (Patient taking differently: Inject 30 mcg into the muscle every Friday. INJECT ONE SYRINGE INTRAMUSCULARLY ONCE WEEKLY. REFRIGERATE. PROTECT FROM LIGHT. ALLOW TO WARM TO ROOM TEMPERATURE PRIOR TO USE.), Disp: 3 each, Rfl: 3   KLOR-CON  M20 20 MEQ tablet, TAKE 1 TABLET BY MOUTH EVERY DAY IN THE MORNING, Disp: 30 tablet, Rfl: 1   meclizine (ANTIVERT) 25 MG tablet, Take 1 tablet (25 mg total) by mouth 3 (three) times daily as needed for dizziness., Disp: 30 tablet, Rfl: 0   metoprolol succinate (TOPROL-XL) 25 MG 24 hr tablet, Take 25 mg by mouth daily., Disp: , Rfl:    Multiple Vitamin (MULTIVITAMIN WITH MINERALS) TABS tablet, Take 1 tablet by mouth every evening. Centrum, Disp: , Rfl:    Omega-3 Fatty Acids (FISH OIL) 1000 MG CPDR, Take 1,000 mg by mouth daily., Disp: , Rfl:    prochlorperazine (COMPAZINE) 10 MG tablet, Take 1 tablet (10 mg total) by mouth every 6 (six) hours as needed (Nausea or vomiting)., Disp: 30 tablet, Rfl: 1   senna-docusate (SENOKOT-S) 8.6-50 MG tablet, Take 2 tablets by mouth at bedtime. For AFTER surgery, do not take if having diarrhea, Disp: 30 tablet,  Rfl: 0   simvastatin (ZOCOR) 20 MG tablet, Take 20 mg by mouth every evening., Disp: , Rfl:    traMADol (ULTRAM) 50 MG tablet, Take 1 tablet (50 mg total) by mouth every 6 (six) hours as needed for severe pain. For AFTER surgery only, do not take and drive, Disp: 10 tablet, Rfl: 0   VENTOLIN HFA 108 (90 BASE) MCG/ACT inhaler, Inhale 1-2 puffs into the lungs every 6 (six) hours as needed for shortness of breath or wheezing., Disp: , Rfl:   PAST MEDICAL HISTORY: Past Medical History:  Diagnosis Date   BRCA2 gene mutation positive in female    Breast cancer ([place]) 01/2020   right breast IDC   Bronchitis    Cervical spondylarthritis    Headache(784.0)    High cholesterol    Hypertension    Leukocytosis 2009   Multiple sclerosis ([place]) 2009   followed by Dr. Erling Cruz   Neuromuscular disorder [facility])    Pneumonia    Rhinitis    Seasonal allergies    Sinusitis     PAST SURGICAL HISTORY: Past Surgical History:  Procedure Laterality Date   ABDOMINAL HYSTERECTOMY  2006   uterus, cervix, portion of fallopian tubes for AUB   BREAST LUMPECTOMY WITH  RADIOACTIVE SEED AND SENTINEL LYMPH NODE BIOPSY Right 08/08/2020   Procedure: RIGHT BREAST LUMPECTOMY WITH RADIOACTIVE SEED X 3 AND SENTINEL LYMPH NODE BIOPSY;  Surgeon: Stark Klein, MD;  Location: [place];  Service: General;  Laterality: Right;   CESAREAN SECTION     x 3   CORRECTION HAMMER TOE Bilateral    PORT-A-CATH REMOVAL Right 09/21/2020   Procedure: REMOVAL PORT-A-CATH;  Surgeon: Stark Klein, MD;  Location: [place];  Service: General;  Laterality: Right;   PORTACATH PLACEMENT Right 02/22/2020   Procedure: RIGHT SUBCLAVIAN VEIN PORT PLACEMENT;  Surgeon: Donnie Mesa, MD;  Location: [place];  Service: General;  Laterality: Right;   ROBOTIC ASSISTED BILATERAL SALPINGO OOPHERECTOMY Bilateral 01/30/2021   Procedure: XI ROBOTIC ASSISTED BILATERAL SALPINGO OOPHORECTOMY, LYSIS OF ADHESIONS, 1 HOUR;  Surgeon: Lafonda Mosses, MD;  Location: [facility];  Service: Gynecology;  Laterality: Bilateral;    FAMILY HISTORY: Family History  Problem Relation Age of Onset   Cancer Mother 15       ovarian cancer; now with suspected breast cancer   Diabetes Mother        also HTN   Breast cancer Mother    Clotting disorder Father    Prostate cancer Father    Colon cancer Sister 2       anal cancer (2014)   Lung cancer Sister 73   Stroke Sister 64   Ovarian cancer Sister 71   BRCA 1/2 Sister    Breast cancer Maternal Aunt    Breast cancer Maternal Aunt    Breast cancer Maternal Aunt    Cancer Maternal Grandmother        unknown female reproductive organ cancer   Endometrial cancer Niece    Pancreatic cancer Neg Hx     SOCIAL HISTORY:  Social History   Socioeconomic History   Marital status: Married    Spouse name: John   Number of children: 2   Years of education: College   Highest education level: Not on file  Occupational History   Occupation: Print production planner: [employer]  Tobacco Use   Smoking status: Never   Smokeless tobacco: Never  Vaping Use    Vaping Use: Never used  Substance  and Sexual Activity   Alcohol use: No   Drug use: No   Sexual activity: Yes    Birth control/protection: Surgical  Other Topics Concern   Not on file  Social History Narrative   Patient lives at home with her spouse.   Caffeine Use: 2 sodas daily   Social Determinants of Health   Financial Resource Strain: Not on file  Food Insecurity: Not on file  Transportation Needs: Not on file  Physical Activity: Not on file  Stress: Not on file  Social Connections: Not on file  Intimate Partner Violence: Not on file     PHYSICAL EXAM  Vitals:   02/06/21 1122  BP: 128/74  Pulse: (!) 127  Weight: 211 lb 8 oz (95.9 kg)  Height: $Remove'5\' 4"'uLyZjFW$  (1.626 m)    Body mass index is 36.3 kg/m.   General: The patient is well-developed and well-nourished and in no acute distress   Neurologic Exam  Mental status: The patient is alert and oriented x 3 at the time of the examination. The patient has apparent normal recent and remote memory, with an apparently normal attention span and concentration ability.   Speech is normal.  Cranial nerves: Extraocular movements are full.  Color vision is symmetric.   Facial strength and sensation was normal.  Trapezius strength is normal.  No obvious hearing deficits are noted.  Motor:  Muscle bulk is normal.   Tone is normal. Strength is  5 / 5 in all 4 extremities.   Sensory: Normal sensation to touch and vibration in bilateral arms and legs.   Coordination: Cerebellar testing reveals good finger-nose-finger and heel-to-shin bilaterally.  Gait and station: Station is normal.  Gait testing was affected by her recent surgery.  Romberg was negative.  Reflexes: Deep tendon reflexes are normal and symmetric in the arms.  Deep tendon reflexes are mildly increased at the knees.       DIAGNOSTIC DATA (LABS, IMAGING, TESTING) - I reviewed patient records, labs, notes, testing and imaging myself where available.  Lab Results   Component Value Date   WBC 8.0 01/12/2021   HGB 12.3 01/12/2021   HCT 37.3 01/12/2021   MCV 79.2 (L) 01/12/2021   PLT 300 01/12/2021      Component Value Date/Time   NA 139 01/12/2021 1339   NA 142 05/05/2020 1625   NA 143 11/22/2013 1514   K 3.5 01/12/2021 1339   K 3.1 (L) 11/22/2013 1514   CL 102 01/12/2021 1339   CL 105 02/18/2012 1207   CO2 27 01/12/2021 1339   CO2 25 11/22/2013 1514   GLUCOSE 90 01/12/2021 1339   GLUCOSE 105 11/22/2013 1514   GLUCOSE 96 02/18/2012 1207   BUN 6 01/12/2021 1339   BUN 8 05/05/2020 1625   BUN 6.6 (L) 11/22/2013 1514   CREATININE 0.59 01/12/2021 1339   CREATININE 0.67 12/13/2020 0829   CREATININE 0.8 11/22/2013 1514   CALCIUM 8.9 01/12/2021 1339   CALCIUM 9.0 11/22/2013 1514   PROT 7.3 12/13/2020 0829   PROT 7.0 09/22/2019 0840   PROT 7.4 11/22/2013 1514   ALBUMIN 3.6 12/13/2020 0829   ALBUMIN 4.0 09/22/2019 0840   ALBUMIN 3.3 (L) 11/22/2013 1514   AST 24 12/13/2020 0829   AST 15 11/22/2013 1514   ALT 20 12/13/2020 0829   ALT 13 11/22/2013 1514   ALKPHOS 91 12/13/2020 0829   ALKPHOS 91 11/22/2013 1514   BILITOT 0.5 12/13/2020 0829   BILITOT 0.23 11/22/2013 1514   GFRNONAA >  60 01/12/2021 1339   GFRNONAA >60 12/13/2020 0829   GFRAA 113 09/22/2019 0840       ASSESSMENT AND PLAN    1. Multiple sclerosis ([place])   2. Numbness   3. Migraine without aura and without status migrainosus, not intractable   4. Dysesthesia       1.  She will continue Avonex..   She has recent blood work and does not need labs today.    MRI every 2 years has been stable. 2.  Continue vit D supplementation, if low today increase amount.   3.  Try to stay active and exercises as tolerated. 4.  She is continuing to follow-up with oncology for her breast cancer.   5.   She will return to see [place] in 6 months or sooner if there are new or worsening neurologic symptoms.   Tesha Archambeau A. Felecia Shelling, MD, PhD 36/43/8377, 93:96 PM Certified in Neurology, Clinical  Neurophysiology, Sleep Medicine, Pain Medicine and Neuroimaging  [facility] [address], [place], [place] [address] [phone]

## 2021-02-06 NOTE — Telephone Encounter (Signed)
Received call from Ms. Kaminsky this morning. Patient reports urinary burning and frequency starting yesterday. She endorses a slight odor. Denies fever or chills. Reports abdominal pain (incisional) that she rates as 5-6/10. Ibuprofen and tylenol help with pain control. Patient concerned she may have a UTI.  Lab appointment scheduled for today at 12:15 pm. Instructed patient that MD will be notified and our office will be in contact with lab results. Patient verbalized understanding. Instructed to call with any needs.

## 2021-02-06 NOTE — Progress Notes (Signed)
Given patient's symptoms, although urinalysis is not highly concerning for urinary tract infection, will send empiric Macrobid to the patient's pharmacy.  We will plan to follow-up on the culture.  Jeral Pinch MD Gynecologic Oncology

## 2021-02-06 NOTE — Telephone Encounter (Signed)
Attempted to reach Jacqueline Adams to discuss UA results. Unable to reach patient. Left voicemail requesting return call.

## 2021-02-06 NOTE — Telephone Encounter (Signed)
Following up with Jacqueline Adams. Reviewed UA results with patient. Per Dr. Berline Lopes UA not overly concerning for UTI but given symptoms a prescription for macrobid has been sent to her pharmacy. We will be in touch once urine culture results are back. Encouraged patient to drink plenty of water throughout the day. Patient verbalized understanding. Instructed to call with any needs.

## 2021-02-07 ENCOUNTER — Telehealth: Payer: Self-pay

## 2021-02-07 ENCOUNTER — Other Ambulatory Visit: Payer: Self-pay | Admitting: Gynecologic Oncology

## 2021-02-07 DIAGNOSIS — N368 Other specified disorders of urethra: Secondary | ICD-10-CM

## 2021-02-07 LAB — URINE CULTURE: Culture: NO GROWTH

## 2021-02-07 MED ORDER — LIDOCAINE 5 % EX OINT: 1.0000 "application " | TOPICAL_OINTMENT | CUTANEOUS | 0 refills | Status: DC | PRN

## 2021-02-07 NOTE — Telephone Encounter (Signed)
Spoke with Ms. Guidry this afternoon. Reviewed urine culture results. Per Dr. Berline Lopes urine culture is negative. Patient verbalized understanding. Patient reports feeling better today but still endorses some burning with urination. She feels the burning in her urethra. Denies discharge or itching. She denies fever but has occasional chills. Temp is 98.4. Dr. Berline Lopes notified. Advised patient to discontinue use of macrobid, a prescription for lidocaine jelly will be called into her pharmacy to help with burning sensation. Instructed to continue to monitor symptoms and notify our office with any new or worsening symptoms or any questions or concerns. Patient verbalized understanding.

## 2021-02-08 ENCOUNTER — Other Ambulatory Visit: Payer: Self-pay | Admitting: Hematology and Oncology

## 2021-02-09 ENCOUNTER — Telehealth: Payer: Self-pay | Admitting: Adult Health

## 2021-02-09 ENCOUNTER — Encounter: Payer: 59 | Admitting: Gynecologic Oncology

## 2021-02-09 NOTE — Telephone Encounter (Signed)
Call patient and talk to her about survivorship care plan visit.  She is going to proceed with this in April.  Scheduling message sent.  She did ask that her appointment be changed from virtual to in person.  I have sent a message to have this completed.  Wilber Bihari, NP 02/09/21 4:24 PM Medical Oncology and Hematology [facility] [place], [place] [address] Tel. [phone]    Fax. [phone]

## 2021-02-13 ENCOUNTER — Telehealth: Payer: Self-pay | Admitting: *Deleted

## 2021-02-13 NOTE — Telephone Encounter (Signed)
[employer] disability insurance called and verified the following information   Patient's surgery date  Last office visit  Next office visit  Surgery procedure  Surgery done out patient  Limitations/restrictions  Last day of work  Return to work date  [employer] number

## 2021-02-13 NOTE — Telephone Encounter (Signed)
Received fax from [employer] that [place] approved 02/13/21-02/13/22.

## 2021-02-13 NOTE — Telephone Encounter (Signed)
Submitted [place] Avonex via fax to [employer] at [phone]. Received fax confirmation. Waiting on determination.

## 2021-02-14 ENCOUNTER — Telehealth: Payer: Self-pay | Admitting: Adult Health

## 2021-02-14 NOTE — Telephone Encounter (Signed)
Scheduled per 12/30 scheduled message, patient has been called and notified.

## 2021-02-19 ENCOUNTER — Inpatient Hospital Stay: Payer: 59 | Attending: Hematology and Oncology | Admitting: Gynecologic Oncology

## 2021-02-19 ENCOUNTER — Other Ambulatory Visit: Payer: Self-pay

## 2021-02-19 ENCOUNTER — Encounter: Payer: Self-pay | Admitting: Gynecologic Oncology

## 2021-02-19 VITALS — BP 133/73 | HR 85 | Temp 98.9°F | Resp 16 | Ht 64.0 in | Wt 216.9 lb

## 2021-02-19 DIAGNOSIS — Z90722 Acquired absence of ovaries, bilateral: Secondary | ICD-10-CM | POA: Insufficient documentation

## 2021-02-19 DIAGNOSIS — Z923 Personal history of irradiation: Secondary | ICD-10-CM | POA: Insufficient documentation

## 2021-02-19 DIAGNOSIS — Z1501 Genetic susceptibility to malignant neoplasm of breast: Secondary | ICD-10-CM

## 2021-02-19 DIAGNOSIS — Z148 Genetic carrier of other disease: Secondary | ICD-10-CM | POA: Insufficient documentation

## 2021-02-19 DIAGNOSIS — B3731 Acute candidiasis of vulva and vagina: Secondary | ICD-10-CM

## 2021-02-19 DIAGNOSIS — Z7189 Other specified counseling: Secondary | ICD-10-CM

## 2021-02-19 DIAGNOSIS — C50411 Malignant neoplasm of upper-outer quadrant of right female breast: Secondary | ICD-10-CM | POA: Insufficient documentation

## 2021-02-19 DIAGNOSIS — Z1509 Genetic susceptibility to other malignant neoplasm: Secondary | ICD-10-CM

## 2021-02-19 DIAGNOSIS — Z171 Estrogen receptor negative status [ER-]: Secondary | ICD-10-CM | POA: Insufficient documentation

## 2021-02-19 DIAGNOSIS — Z9221 Personal history of antineoplastic chemotherapy: Secondary | ICD-10-CM | POA: Insufficient documentation

## 2021-02-19 DIAGNOSIS — Z1502 Genetic susceptibility to malignant neoplasm of ovary: Secondary | ICD-10-CM

## 2021-02-19 NOTE — Assessment & Plan Note (Signed)
01/10/2020:Palpable right breast mass. Mammogram showed a 1.8cm mass at the 10:30 position 2cm from the nipple, a 1.5cm mass at the 10:30 position 7cm from the nipple, and indeterminate calcifications between the two masses in the right breast. 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 BRCA2Mutation  Treatment plan: 1. Neoadjuvant chemotherapy with Adriamycin and Cytoxan every2weeks4 followed by Taxol weekly 12 with carboplatinand every 3 weeks x4completed 07/05/2020 2.6/28/22breast conserving surgery with sentinel lymph node study : scattered foci of DCIS, margins Neg, 0/3 LN Neg 3. Adjuvant radiation therapy 11/22/20- 12/18/20 4.Adjuvant olaparib URCC nausea study  Breast MRI12/04/2019: 2 cm spiculated mass UOQ right breast, 2.1 cm spiculated mass UOQ right breast, biopsy-proven malignancies. 7 mm suspicious enhancing mass: Biopsy 02/02/2020: High-grade DCIS ----------------------------------------------------------------------------------------------------------------------------------------------- Olaparib: Lonie Peak is a PARP inhibitor which has been approved for adj therapy of BRCA2 mutation [place] trial showed that the recurrence rate improves with olaparib.  86% versus 77%. Dose: 300 mg twice daily  We may start at a lower dosage and titrate upwards. Adverse effects counseling: Olaparib can cause gastrointestinal toxicity, cytopenias, fatigue, generalized weakness etc. She may have foot surgery coming up and therefore we will hold off on olaparib at this time. Referral to GYN oncology for prophylactic oophorectomy.  MyChart virtual visit in January to discuss starting olaparib.

## 2021-02-19 NOTE — Progress Notes (Signed)
Gynecologic Oncology Return Clinic Visit  02/19/2021  Reason for Visit: Follow-up after surgery  Treatment History: Oncology History  Malignant neoplasm of upper-outer quadrant of right breast in female, estrogen receptor negative ([place])  01/10/2020 Initial Diagnosis   Palpable right breast mass. Mammogram showed a 1.8cm mass at the 10:30 position 2cm from the nipple, a 1.5cm mass at the 10:30 position 7cm from the nipple, and indeterminate calcifications between the two masses in the right breast. 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    01/12/2020 Cancer Staging   Staging form: Breast, AJCC 8th Edition - Clinical stage from 01/12/2020: Stage IB (cT1c, cN0, cM0, G3, ER-, PR-, HER2-) - Signed by Nicholas Lose, MD on 02/03/2020    02/23/2020 -  Chemotherapy    Patient is on Treatment Plan: BREAST DOSE DENSE AC Q14D / CARBOPLATIN D1 + PACLITAXEL D1,8,15 Q21D       08/08/2020 Surgery   Right breast lumpectomy: scattered foci of DCIS with calcifications, intermediate to high-grade, largest measuring 0.3 cm, no residual invasive carcinoma, resection margins negative for DCIS, and all 3 axillary nodes negative for carcinoma     Interval History: Presents today for follow-up after recent surgery in late December.  Reports overall doing well.  Denies any significant abdominal or pelvic pain.  Has little twinges intermittently around incisions.  Tolerating regular diet without nausea or emesis.  Reports regular bowel and bladder function.  Vulvar pruritus is improving, took second dose of Diflucan on Friday.  Denies any discharge or bleeding.  Past Medical/Surgical History: Past Medical History:  Diagnosis Date   BRCA2 gene mutation positive in female    Breast cancer ([place]) 01/2020   right breast IDC   Bronchitis    Cervical spondylarthritis    Headache(784.0)    High cholesterol    Hypertension    Leukocytosis  2009   Multiple sclerosis ([place]) 2009   followed by Dr. Erling Cruz   Neuromuscular disorder [facility])    Pneumonia    Rhinitis    Seasonal allergies    Sinusitis     Past Surgical History:  Procedure Laterality Date   ABDOMINAL HYSTERECTOMY  2006   uterus, cervix, portion of fallopian tubes for AUB   BREAST LUMPECTOMY WITH RADIOACTIVE SEED AND SENTINEL LYMPH NODE BIOPSY Right 08/08/2020   Procedure: RIGHT BREAST LUMPECTOMY WITH RADIOACTIVE SEED X 3 AND SENTINEL LYMPH NODE BIOPSY;  Surgeon: Stark Klein, MD;  Location: [place];  Service: General;  Laterality: Right;   CESAREAN SECTION     x 3   CORRECTION HAMMER TOE Bilateral    PORT-A-CATH REMOVAL Right 09/21/2020   Procedure: REMOVAL PORT-A-CATH;  Surgeon: Stark Klein, MD;  Location: [place];  Service: General;  Laterality: Right;   PORTACATH PLACEMENT Right 02/22/2020   Procedure: RIGHT SUBCLAVIAN VEIN PORT PLACEMENT;  Surgeon: Donnie Mesa, MD;  Location: [place];  Service: General;  Laterality: Right;   ROBOTIC ASSISTED BILATERAL SALPINGO OOPHERECTOMY Bilateral 01/30/2021   Procedure: XI ROBOTIC ASSISTED BILATERAL SALPINGO OOPHORECTOMY, LYSIS OF ADHESIONS, 1 HOUR;  Surgeon: Lafonda Mosses, MD;  Location: [facility];  Service: Gynecology;  Laterality: Bilateral;    Family History  Problem Relation Age of Onset   Cancer Mother 22       ovarian cancer; now with suspected breast cancer   Diabetes Mother        also HTN   Breast cancer Mother  Clotting disorder Father    Prostate cancer Father    Colon cancer Sister 48       anal cancer (2014)   Lung cancer Sister 32   Stroke Sister 63   Ovarian cancer Sister 66   BRCA 1/2 Sister    Breast cancer Maternal Aunt    Breast cancer Maternal Aunt    Breast cancer Maternal Aunt    Cancer Maternal Grandmother        unknown female reproductive organ cancer   Endometrial cancer Niece    Pancreatic cancer Neg Hx     Social History   Socioeconomic History    Marital status: Married    Spouse name: John   Number of children: 2   Years of education: College   Highest education level: Not on file  Occupational History   Occupation: Print production planner: [employer]  Tobacco Use   Smoking status: Never   Smokeless tobacco: Never  Vaping Use   Vaping Use: Never used  Substance and Sexual Activity   Alcohol use: No   Drug use: No   Sexual activity: Yes    Birth control/protection: Surgical  Other Topics Concern   Not on file  Social History Narrative   Patient lives at home with her spouse.   Caffeine Use: 2 sodas daily   Social Determinants of Health   Financial Resource Strain: Not on file  Food Insecurity: Not on file  Transportation Needs: Not on file  Physical Activity: Not on file  Stress: Not on file  Social Connections: Not on file    Current Medications:  Current Outpatient Medications:    Calcium Carb-Cholecalciferol (CALCIUM 600+D3 PO), Take 1 tablet by mouth in the morning and at bedtime., Disp: , Rfl:    Cholecalciferol (VITAMIN D3) 25 MCG (1000 UT) CAPS, Take 1,000 Units by mouth in the morning., Disp: , Rfl:    Ferrous Sulfate Dried (SLOW RELEASE IRON) 45 MG TBCR, Take 45 mg by mouth in the morning., Disp: , Rfl:    fexofenadine (ALLEGRA) 180 MG tablet, Take 180 mg by mouth daily as needed for allergies., Disp: , Rfl:    fluconazole (DIFLUCAN) 150 MG tablet, SMARTSIG:1 Tablet(s) By Mouth Every 3 Days PRN, Disp: , Rfl:    fluticasone (FLONASE) 50 MCG/ACT nasal spray, Place 2 sprays into both nostrils daily. (Patient taking differently: Place 2 sprays into both nostrils daily as needed for allergies.), Disp: 16 g, Rfl: 2   ibuprofen (ADVIL) 200 MG tablet, Take 400 mg by mouth every Friday. W/Avonex injection, Disp: , Rfl:    interferon beta-1a (AVONEX PREFILLED) 30 MCG/0.5ML PSKT injection, INJECT ONE SYRINGE INTRAMUSCULARLY ONCE WEEKLY. REFRIGERATE. PROTECT FROM LIGHT. ALLOW TO WARM TO ROOM TEMPERATURE PRIOR TO USE.  (Patient taking differently: Inject 30 mcg into the muscle every Friday. INJECT ONE SYRINGE INTRAMUSCULARLY ONCE WEEKLY. REFRIGERATE. PROTECT FROM LIGHT. ALLOW TO WARM TO ROOM TEMPERATURE PRIOR TO USE.), Disp: 3 each, Rfl: 3   KLOR-CON M20 20 MEQ tablet, TAKE 1 TABLET BY MOUTH EVERY DAY IN THE MORNING, Disp: 30 tablet, Rfl: 1   metoprolol succinate (TOPROL-XL) 25 MG 24 hr tablet, Take 25 mg by mouth daily., Disp: , Rfl:    Multiple Vitamin (MULTIVITAMIN WITH MINERALS) TABS tablet, Take 1 tablet by mouth every evening. Centrum, Disp: , Rfl:    Omega-3 Fatty Acids (FISH OIL) 1000 MG CPDR, Take 1,000 mg by mouth daily., Disp: , Rfl:    simvastatin (ZOCOR) 20 MG tablet, Take  20 mg by mouth every evening., Disp: , Rfl:    VENTOLIN HFA 108 (90 BASE) MCG/ACT inhaler, Inhale 1-2 puffs into the lungs every 6 (six) hours as needed for shortness of breath or wheezing., Disp: , Rfl:    ibuprofen (ADVIL) 800 MG tablet, Take 1 tablet (800 mg total) by mouth every 8 (eight) hours as needed for moderate pain. For AFTER surgery only (Patient not taking: Reported on 02/19/2021), Disp: 30 tablet, Rfl: 0   lidocaine (XYLOCAINE) 5 % ointment, Apply 1 application topically as needed. (Patient not taking: Reported on 02/19/2021), Disp: 35.44 g, Rfl: 0   meclizine (ANTIVERT) 25 MG tablet, Take 1 tablet (25 mg total) by mouth 3 (three) times daily as needed for dizziness., Disp: 30 tablet, Rfl: 0   prochlorperazine (COMPAZINE) 10 MG tablet, Take 1 tablet (10 mg total) by mouth every 6 (six) hours as needed (Nausea or vomiting)., Disp: 30 tablet, Rfl: 1   senna-docusate (SENOKOT-S) 8.6-50 MG tablet, Take 2 tablets by mouth at bedtime. For AFTER surgery, do not take if having diarrhea (Patient not taking: Reported on 02/19/2021), Disp: 30 tablet, Rfl: 0   traMADol (ULTRAM) 50 MG tablet, Take 1 tablet (50 mg total) by mouth every 6 (six) hours as needed for severe pain. For AFTER surgery only, do not take and drive (Patient not taking:  Reported on 02/19/2021), Disp: 10 tablet, Rfl: 0  Review of Systems: Denies appetite changes, fevers, chills, fatigue, unexplained weight changes. Denies hearing loss, neck lumps or masses, mouth sores, ringing in ears or voice changes. Denies cough or wheezing.  Denies shortness of breath. Denies chest pain or palpitations. Denies leg swelling. Denies abdominal distention, pain, blood in stools, constipation, diarrhea, nausea, vomiting, or early satiety. Denies pain with intercourse, dysuria, frequency, hematuria or incontinence. Denies hot flashes, pelvic pain, vaginal bleeding or vaginal discharge.   Denies joint pain, back pain or muscle pain/cramps. Denies itching, rash, or wounds. Denies dizziness, headaches, numbness or seizures. Denies swollen lymph nodes or glands, denies easy bruising or bleeding. Denies anxiety, depression, confusion, or decreased concentration.  Physical Exam: BP 133/73 (BP Location: Left Arm, Patient Position: Sitting)    Pulse 85    Temp 98.9 F (37.2 C) (Tympanic)    Resp 16    Ht 5' 4" (1.626 m)    Wt 216 lb 14.4 oz (98.4 kg)    SpO2 99%    BMI 37.23 kg/m  General: Alert, oriented, no acute distress. HEENT: Atraumatic, normocephalic, sclera anicteric. Chest: Unlabored breathing on room air. Abdomen: Obese, soft, nontender.  Normoactive bowel sounds.  No masses or hepatosplenomegaly appreciated.  Well-healed incisions, Dermabond removed. Extremities: Grossly normal range of motion.  Warm, well perfused.  No edema bilaterally.  Laboratory & Radiologic Studies: Pelvic washings with no malignant cells identified.  A. ADNEXA, RIGHT, SALINGO OOPHORECTOMY:  - Benign serous cystadenoma, 5.5 cm  - No evidence of malignancy  - See comment   B. ADNEXA, LEFT, SALPINGO OOPHORECTOMY:  - Benign serous cystadenoma, 5.2 cm  - No evidence of malignancy  - See comment   Assessment & Plan: TIFFANNIE SLOSS is a 61 y.o. woman who is approximately 3 weeks status  post robotic bilateral oophorectomy, lysis of adhesion, and oversew of small bowel serosa in the setting of BRCA2 mutation.  Meeting postoperative milestones.  Discussed continued activity restrictions and postoperative expectations.  Patient has had improvement with regard to her vulvar pruritus.  She is especially prone to yeast infections, and has had  to take multiple courses of Diflucan after prior antibiotics.  She has a prescription for additional Diflucan if needed.  We also discussed the possibility of trying Monistat.  She will call me if she does not have continued improvement of her vulvar pruritus.  I reviewed her pathology with her in detail.  Copy of her pathology report was given to the patient.  We discussed significant decreased risk in BRCA-associated GYN cancer.  There is a small but present risk of primary peritoneal cancer.  I recommend that she have at least a yearly visit with either her primary care provider or medical oncologist to review any symptoms she might be having.  She and I reviewed today what symptoms would be concerning and should prompt her to reach out for further evaluation.  24 minutes of total time was spent for this patient encounter, including preparation, face-to-face counseling with the patient and coordination of care, and documentation of the encounter.  Jeral Pinch, MD  Division of Gynecologic Oncology  Department of Obstetrics and Gynecology  [facility]

## 2021-02-19 NOTE — Patient Instructions (Signed)
Good to see you today.  You are healing very well from surgery!  No heavy lifting until closer to 6 weeks after surgery.  Please not hesitate to contact me if you need anything in the future.

## 2021-02-20 ENCOUNTER — Telehealth: Payer: Self-pay

## 2021-02-20 ENCOUNTER — Inpatient Hospital Stay (HOSPITAL_BASED_OUTPATIENT_CLINIC_OR_DEPARTMENT_OTHER): Payer: 59 | Admitting: Hematology and Oncology

## 2021-02-20 ENCOUNTER — Other Ambulatory Visit (HOSPITAL_COMMUNITY): Payer: Self-pay

## 2021-02-20 ENCOUNTER — Encounter: Payer: Self-pay | Admitting: Hematology and Oncology

## 2021-02-20 DIAGNOSIS — Z171 Estrogen receptor negative status [ER-]: Secondary | ICD-10-CM

## 2021-02-20 DIAGNOSIS — C50411 Malignant neoplasm of upper-outer quadrant of right female breast: Secondary | ICD-10-CM

## 2021-02-20 DIAGNOSIS — Z90722 Acquired absence of ovaries, bilateral: Secondary | ICD-10-CM | POA: Diagnosis not present

## 2021-02-20 DIAGNOSIS — Z923 Personal history of irradiation: Secondary | ICD-10-CM | POA: Diagnosis not present

## 2021-02-20 DIAGNOSIS — Z9221 Personal history of antineoplastic chemotherapy: Secondary | ICD-10-CM | POA: Diagnosis not present

## 2021-02-20 DIAGNOSIS — Z148 Genetic carrier of other disease: Secondary | ICD-10-CM | POA: Diagnosis not present

## 2021-02-20 MED ORDER — OLAPARIB 150 MG PO TABS
150.0000 mg | ORAL_TABLET | Freq: Two times a day (BID) | ORAL | 0 refills | Status: DC
  Filled 2021-02-20: qty 60, 30d supply, fill #0

## 2021-02-20 NOTE — Telephone Encounter (Signed)
Oral Oncology Patient Advocate Encounter   Received notification from [facility] that prior authorization for Jacqueline Adams is required.   PA submitted on CoverMyMeds Key [id number] Status is pending   Oral Oncology Clinic will continue to follow.   [place] Patient [place] Phone [phone] Fax [phone] 02/20/2021 11:50 AM

## 2021-02-20 NOTE — Progress Notes (Signed)
MyChart virtual visit  Patient Care Team: Robyne Peers, MD as PCP - General (Family Medicine) Jerrell Belfast, MD Mauro Kaufmann, RN as Oncology Nurse Navigator Rockwell Germany, RN as Oncology Nurse Navigator Donnie Mesa, MD as Consulting Physician (General Surgery) Nicholas Lose, MD as Consulting Physician (Hematology and Oncology) Kyung Rudd, MD as Consulting Physician (Radiation Oncology) Felecia Shelling, Nanine Means, MD (Neurology)  DIAGNOSIS:  Encounter Diagnosis  Name Primary?   Malignant neoplasm of upper-outer quadrant of right breast in female, estrogen receptor negative ([place])     SUMMARY OF ONCOLOGIC HISTORY: Oncology History  Malignant neoplasm of upper-outer quadrant of right breast in female, estrogen receptor negative ([place])  01/10/2020 Initial Diagnosis   Palpable right breast mass. Mammogram showed a 1.8cm mass at the 10:30 position 2cm from the nipple, a 1.5cm mass at the 10:30 position 7cm from the nipple, and indeterminate calcifications between the two masses in the right breast. 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    01/12/2020 Cancer Staging   Staging form: Breast, AJCC 8th Edition - Clinical stage from 01/12/2020: Stage IB (cT1c, cN0, cM0, G3, ER-, PR-, HER2-) - Signed by Nicholas Lose, MD on 02/03/2020    02/23/2020 -  Chemotherapy    Patient is on Treatment Plan: BREAST DOSE DENSE AC Q14D / CARBOPLATIN D1 + PACLITAXEL D1,8,15 Q21D       08/08/2020 Surgery   Right breast lumpectomy: scattered foci of DCIS with calcifications, intermediate to high-grade, largest measuring 0.3 cm, no residual invasive carcinoma, resection margins negative for DCIS, and all 3 axillary nodes negative for carcinoma     CHIEF COMPLIANT: Follow-up to discuss starting olaparib  INTERVAL HISTORY: Jacqueline Adams is a 61 year old with above-mentioned still BRCA2 mutation who had triple negative  breast cancer and had completed neoadjuvant chemotherapy followed by right lumpectomy in June 2022.  She could not start adjuvant olaparib because of multiple health issues COVID as well as loss of family members and finally she is here today to discuss starting on the medication.   ALLERGIES:  is allergic to nabumetone, procaine, and shellfish allergy.  MEDICATIONS:  Current Outpatient Medications  Medication Sig Dispense Refill   Calcium Carb-Cholecalciferol (CALCIUM 600+D3 PO) Take 1 tablet by mouth in the morning and at bedtime.     Cholecalciferol (VITAMIN D3) 25 MCG (1000 UT) CAPS Take 1,000 Units by mouth in the morning.     Ferrous Sulfate Dried (SLOW RELEASE IRON) 45 MG TBCR Take 45 mg by mouth in the morning.     fexofenadine (ALLEGRA) 180 MG tablet Take 180 mg by mouth daily as needed for allergies.     fluconazole (DIFLUCAN) 150 MG tablet SMARTSIG:1 Tablet(s) By Mouth Every 3 Days PRN     fluticasone (FLONASE) 50 MCG/ACT nasal spray Place 2 sprays into both nostrils daily. (Patient taking differently: Place 2 sprays into both nostrils daily as needed for allergies.) 16 g 2   ibuprofen (ADVIL) 200 MG tablet Take 400 mg by mouth every Friday. W/Avonex injection     interferon beta-1a (AVONEX PREFILLED) 30 MCG/0.5ML PSKT injection INJECT ONE SYRINGE INTRAMUSCULARLY ONCE WEEKLY. REFRIGERATE. PROTECT FROM LIGHT. ALLOW TO WARM TO ROOM TEMPERATURE PRIOR TO USE. (Patient taking differently: Inject 30 mcg into the muscle every Friday. INJECT ONE SYRINGE INTRAMUSCULARLY ONCE WEEKLY. REFRIGERATE. PROTECT FROM LIGHT. ALLOW TO WARM TO ROOM TEMPERATURE PRIOR TO USE.) 3 each 3   KLOR-CON M20 20  MEQ tablet TAKE 1 TABLET BY MOUTH EVERY DAY IN THE MORNING 30 tablet 1   meclizine (ANTIVERT) 25 MG tablet Take 1 tablet (25 mg total) by mouth 3 (three) times daily as needed for dizziness. 30 tablet 0   metoprolol succinate (TOPROL-XL) 25 MG 24 hr tablet Take 25 mg by mouth daily.     Multiple Vitamin  (MULTIVITAMIN WITH MINERALS) TABS tablet Take 1 tablet by mouth every evening. Centrum     Omega-3 Fatty Acids (FISH OIL) 1000 MG CPDR Take 1,000 mg by mouth daily.     prochlorperazine (COMPAZINE) 10 MG tablet Take 1 tablet (10 mg total) by mouth every 6 (six) hours as needed (Nausea or vomiting). 30 tablet 1   simvastatin (ZOCOR) 20 MG tablet Take 20 mg by mouth every evening.     VENTOLIN HFA 108 (90 BASE) MCG/ACT inhaler Inhale 1-2 puffs into the lungs every 6 (six) hours as needed for shortness of breath or wheezing.     No current facility-administered medications for this visit.    PHYSICAL EXAMINATION: ECOG PERFORMANCE STATUS: 1 - Symptomatic but completely ambulatory       LABORATORY DATA:  I have reviewed the data as listed CMP Latest Ref Rng & Units 01/12/2021 12/13/2020 09/21/2020  Glucose 70 - 99 mg/dL 90 109(H) 108(H)  BUN 6 - 20 mg/dL 6 8 <5(L)  Creatinine 0.44 - 1.00 mg/dL 0.59 0.67 0.60  Sodium 135 - 145 mmol/L 139 142 140  Potassium 3.5 - 5.1 mmol/L 3.5 3.7 3.5  Chloride 98 - 111 mmol/L 102 107 106  CO2 22 - 32 mmol/L 27 25 25   Calcium 8.9 - 10.3 mg/dL 8.9 9.0 8.9  Total Protein 6.5 - 8.1 g/dL - 7.3 -  Total Bilirubin 0.3 - 1.2 mg/dL - 0.5 -  Alkaline Phos 38 - 126 U/L - 91 -  AST 15 - 41 U/L - 24 -  ALT 0 - 44 U/L - 20 -    Lab Results  Component Value Date   WBC 8.0 01/12/2021   HGB 12.3 01/12/2021   HCT 37.3 01/12/2021   MCV 79.2 (L) 01/12/2021   PLT 300 01/12/2021   NEUTROABS 6.1 12/13/2020    ASSESSMENT & PLAN:  Malignant neoplasm of upper-outer quadrant of right breast in female, estrogen receptor negative ([place]) 01/10/2020:Palpable right breast mass. Mammogram showed a 1.8cm mass at the 10:30 position 2cm from the nipple, a 1.5cm mass at the 10:30 position 7cm from the nipple, and indeterminate calcifications between the two masses in the right breast. Biopsy showed IDC with DCIS, grade 2, at both masses, HER-2 negative by FISH (ratio 1.77), ER/PR  negative, Ki67 80%.  BRCA2 Mutation   Treatment plan: 1. Neoadjuvant chemotherapy with Adriamycin and Cytoxan every 2 weeks 4 followed by Taxol weekly 12 with carboplatin and  every 3 weeks x4 completed 07/05/2020 2. 08/08/20 breast conserving surgery with sentinel lymph node study : scattered foci of DCIS, margins Neg, 0/3 LN Neg 3. Adjuvant radiation therapy 11/22/20- 12/18/20 4. Adjuvant olaparib started 02/20/21 [facility] nausea study   Breast MRI 01/14/2020: 2 cm spiculated mass UOQ right breast, 2.1 cm spiculated mass UOQ right breast, biopsy-proven malignancies.  7 mm suspicious enhancing mass: Biopsy 02/02/2020: High-grade DCIS ----------------------------------------------------------------------------------------------------------------------------------------------- Olaparib: Lonie Peak is a PARP inhibitor which has been approved for adj therapy of BRCA2 mutation started 02/20/2021  [place] trial showed that the recurrence rate improves with olaparib.  86% versus 77%. Dose: 300 mg twice daily  We may start at  a lower dosage and titrate upwards. Adverse effects counseling: Olaparib can cause gastrointestinal toxicity, cytopenias, fatigue, generalized weakness etc.  She may have foot surgery coming up and therefore we will hold off on olaparib at this time. she had prophylactic oophorectomy.   Return to clinic in 2 weeks for appointment with Cindra Eves  No orders of the defined types were placed in this encounter.  The patient has a good understanding of the overall plan. she agrees with it. she will call with any problems that may develop before the next visit here. Total time spent: 30 mins including face to face time and time spent for planning, charting and co-ordination of care   Jacqueline Ohara, MD 02/20/21

## 2021-02-20 NOTE — Telephone Encounter (Signed)
Oral Oncology Pharmacist Encounter  Received new prescription for olaparib Lonie Peak) for the adjuvant treatment of triple negative breast cancer, planned duration for a total of 1 year or until disease progression or unacceptable toxicity. Prescription dose and frequency assessed, medication dose decreased and will titrate up per MD if tolerated.  Labs from 12/13/2020 assessed, no interventions needed.  Current medication list in Epic reviewed, DDIs with lynparza identified: none  Evaluated chart and no patient barriers to medication adherence noted.   Patient agreement for treatment documented in MD note on 02/20/2021.  Prescription has been e-scribed to the [facility] for benefits analysis and approval.  Oral Oncology Clinic will continue to follow for insurance authorization, copayment issues, initial counseling and start date.  Drema Halon, PharmD Hematology/Oncology Clinical Pharmacist [facility] [phone] 02/20/2021 9:41 AM

## 2021-02-23 MED ORDER — OLAPARIB 150 MG PO TABS: 150.0000 mg | ORAL_TABLET | Freq: Two times a day (BID) | ORAL | 0 refills | Status: DC

## 2021-02-23 NOTE — Telephone Encounter (Signed)
Oral Oncology Patient Advocate Encounter  Prior Authorization for Jacqueline Adams has been approved.    PA# [id number] Effective dates: 02/23/21 through 02/23/22  Patient must fll at [place] [facility] will continue to follow.   [place] Patient [place] Phone [phone] Fax [phone] 02/23/2021 10:41 AM

## 2021-03-02 NOTE — Telephone Encounter (Signed)
Oral Chemotherapy Pharmacist Encounter  I spoke with patient today for overview of: Lynparza for the adjuvant treatment of triple negative breast cancer with response to platinum-based chemotherapy, planned duration until disease progression or unacceptable toxicity or for up to a total of one year.   Counseled patient on administration, dosing, side effects, monitoring, drug-food interactions, safe handling, storage, and disposal.  Patient will take Lynparza 150mg  tablets, 1 tablets (150mg ) by mouth 2 times daily without regard to food.  Patient counseled that administering with food may increase tolerability.  Patient knows to avoid grapefruit or grapefruit juice while on therapy with Lynparza.  Lonie Peak start date:  03/03/2021   Adverse effects include but are not limited to: nausea, vomiting, diarrhea, taste changes, mouth sores, fatigue, constipation, decreased blood counts, and joint pain. Patient informed that pneumonitis (including some fatalities) has occurred rarely. Myelodysplastic syndrome/acute myeloid leukemia (MDS/AML) have been reported (rarely) in clinical trials.  Patient has anti-emetic on hand and knows to take it if nausea develops.   Patient will obtain anti diarrheal and alert the office of 4 or more loose stools above baseline.  Reviewed with patient importance of keeping a medication schedule and plan for any missed doses. No barriers to medication adherence identified.  Medication reconciliation performed and medication/allergy list updated.  Insurance authorization for Lonie Peak has been obtained. [facility]   Patient informed the pharmacy will reach out 5-7 days prior to needing next fill of Lynparza to coordinate continued medication acquisition to prevent break in therapy.  All questions answered.  Mrs. Santor voiced understanding and appreciation.   Medication education handout placed in mail for patient.  Patient knows to call the office with questions or concerns. Oral Chemotherapy Clinic phone number provided to patient.   Drema Halon, PharmD Hematology/Oncology Clinical Pharmacist [facility] [phone] 03/02/2021   3:43 PM

## 2021-03-06 ENCOUNTER — Other Ambulatory Visit: Payer: Self-pay | Admitting: *Deleted

## 2021-03-06 ENCOUNTER — Inpatient Hospital Stay: Payer: 59

## 2021-03-06 ENCOUNTER — Other Ambulatory Visit: Payer: Self-pay

## 2021-03-06 ENCOUNTER — Inpatient Hospital Stay: Payer: 59 | Admitting: Pharmacist

## 2021-03-06 ENCOUNTER — Other Ambulatory Visit: Payer: Self-pay | Admitting: Neurology

## 2021-03-06 VITALS — BP 128/64 | HR 95 | Temp 97.7°F | Resp 18 | Ht 64.0 in | Wt 219.1 lb

## 2021-03-06 DIAGNOSIS — C50411 Malignant neoplasm of upper-outer quadrant of right female breast: Secondary | ICD-10-CM | POA: Diagnosis not present

## 2021-03-06 DIAGNOSIS — G35 Multiple sclerosis: Secondary | ICD-10-CM

## 2021-03-06 DIAGNOSIS — Z171 Estrogen receptor negative status [ER-]: Secondary | ICD-10-CM

## 2021-03-06 LAB — CBC WITH DIFFERENTIAL (CANCER CENTER ONLY)
Abs Immature Granulocytes: 0.03 10*3/uL (ref 0.00–0.07)
Basophils Absolute: 0.1 10*3/uL (ref 0.0–0.1)
Basophils Relative: 1 %
Eosinophils Absolute: 0.3 10*3/uL (ref 0.0–0.5)
Eosinophils Relative: 3 %
HCT: 35.8 % — ABNORMAL LOW (ref 36.0–46.0)
Hemoglobin: 11.5 g/dL — ABNORMAL LOW (ref 12.0–15.0)
Immature Granulocytes: 0 %
Lymphocytes Relative: 27 %
Lymphs Abs: 2.5 10*3/uL (ref 0.7–4.0)
MCH: 25.3 pg — ABNORMAL LOW (ref 26.0–34.0)
MCHC: 32.1 g/dL (ref 30.0–36.0)
MCV: 78.9 fL — ABNORMAL LOW (ref 80.0–100.0)
Monocytes Absolute: 0.6 10*3/uL (ref 0.1–1.0)
Monocytes Relative: 6 %
Neutro Abs: 5.8 10*3/uL (ref 1.7–7.7)
Neutrophils Relative %: 63 %
Platelet Count: 261 10*3/uL (ref 150–400)
RBC: 4.54 MIL/uL (ref 3.87–5.11)
RDW: 16.3 % — ABNORMAL HIGH (ref 11.5–15.5)
WBC Count: 9.2 10*3/uL (ref 4.0–10.5)
nRBC: 0 % (ref 0.0–0.2)

## 2021-03-06 LAB — CMP (CANCER CENTER ONLY)
ALT: 12 U/L (ref 0–44)
AST: 15 U/L (ref 15–41)
Albumin: 3.9 g/dL (ref 3.5–5.0)
Alkaline Phosphatase: 93 U/L (ref 38–126)
Anion gap: 9 (ref 5–15)
BUN: 7 mg/dL (ref 6–20)
CO2: 27 mmol/L (ref 22–32)
Calcium: 9.2 mg/dL (ref 8.9–10.3)
Chloride: 107 mmol/L (ref 98–111)
Creatinine: 0.69 mg/dL (ref 0.44–1.00)
GFR, Estimated: >60 mL/min (ref >60)
Glucose, Bld: 126 mg/dL — ABNORMAL HIGH (ref 70–99)
Potassium: 3.5 mmol/L (ref 3.5–5.1)
Sodium: 143 mmol/L (ref 135–145)
Total Bilirubin: 0.3 mg/dL (ref 0.3–1.2)
Total Protein: 7.5 g/dL (ref 6.5–8.1)

## 2021-03-06 NOTE — Progress Notes (Signed)
Jacqueline Adams       Telephone: [phone]?Fax: [phone]   Oncology Clinical Pharmacist Practitioner Initial Assessment  Jacqueline Adams is a 61 y.o. female with a diagnosis of breast cancer. They were contacted today via in-person visit.  Indication/Regimen Olaparib Isaiah Blakes) is being used appropriately for treatment of breast cancer in the adjuvant setting by Dr. Nicholas Lose.    Wt Readings from Last 1 Encounters:  03/06/21 219 lb 1.6 oz (99.4 kg)    Estimated body surface area is 2.12 meters squared as calculated from the following:   Height as of this encounter: 5\' 4"  (1.626 m).   Weight as of this encounter: 219 lb 1.6 oz (99.4 kg).  The dosing regimen is olaparib 150 mg by mouth every 12 hours on days 1 to 28 of a 28-day cycle. This is being given  monotherapy. It is planned to continue until  for one year in the adjuvant setting per the [employer]mpi[employer] trial data . [name] was seen in pharmacy clinic today after being referred by Dr. [name].  She started on adjuvant olaparib on 03/04/21 and will remain on this for one year or until unacceptable toxicity. She is doing well and not experiencing any side effects from olaparib at this time.  We went over potential side effects of olaparib which include but are not limited to: MDS/AML, pneumonitis, VTE, N/V/D, fatigue, anemia, headache, neutropenia, and arthralgias.  We discussed the importance of checking her temperature if feeling ill and recommended contacting the clinic at [phone] if she would have any fevers of 100.4 F or higher per the CDC recommendations. We also went over proper storage and handling of the olaparib and to avoid grapefruit products and [place] oranges.  [name] will have labs in 2 weeks and visit with clinical pharmacy and then likely will increase to full dose olaparib (300 mg every 12 hours) if tolerated.  Dose Modifications 150 mg every 12 hours with plan to titrate to full dose, 300  mg every 12 hours if tolerated   Access Assessment [name] will be receiving olaparib through  [place] Concerns: no Start date if known: 03/04/21  Allergies Allergies  Allergen Reactions   Nabumetone Swelling and Hives   Procaine Swelling   Shellfish Allergy Anaphylaxis    Vitals Vitals with BMI 03/06/2021 02/19/2021 02/06/2021  Height 5\' 4"  5\' 4"  5\' 4"   Weight 219 lbs 2 oz 216 lbs 14 oz 211 lbs 8 oz  BMI 37.59 46.96 29.52  Systolic 841 324 401  Diastolic 64 73 74  Pulse 95 85 127     Laboratory Data CBC EXTENDED Latest Ref Rng & Units 03/06/2021 01/12/2021 12/13/2020  WBC 4.0 - 10.5 K/uL 9.2 8.0 8.7  RBC 3.87 - 5.11 MIL/uL 4.54 4.71 4.81  HGB 12.0 - 15.0 g/dL 11.5(L) 12.3 12.0  HCT 36.0 - 46.0 % 35.8(L) 37.3 35.9(L)  PLT 150 - 400 K/uL 261 300 244  NEUTROABS 1.7 - 7.7 K/uL 5.8 - 6.1  LYMPHSABS 0.7 - 4.0 K/uL 2.5 - 1.8    CMP Latest Ref Rng & Units 03/06/2021 01/12/2021 12/13/2020  Glucose 70 - 99 mg/dL 126(H) 90 109(H)  BUN 6 - 20 mg/dL 7 6 8   Creatinine 0.44 - 1.00 mg/dL 0.69 0.59 0.67  Sodium 135 - 145 mmol/L 143 139 142  Potassium 3.5 - 5.1 mmol/L 3.5 3.5 3.7  Chloride 98 - 111 mmol/L 107 102 107  CO2 22 - 32 mmol/L  27 27 25   Calcium 8.9 - 10.3 mg/dL 9.2 8.9 9.0  Total Protein 6.5 - 8.1 g/dL 7.5 - 7.3  Total Bilirubin 0.3 - 1.2 mg/dL 0.3 - 0.5  Alkaline Phos 38 - 126 U/L 93 - 91  AST 15 - 41 U/L 15 - 24  ALT 0 - 44 U/L 12 - 20   Lab Results  Component Value Date   MG 1.8 05/22/2020     Contraindications Contraindications were reviewed? Yes Contraindications to therapy were identified? No   Safety Precautions The following safety precautions for the use of olaparib were reviewed:  Fever: reviewed the importance of having a thermometer and the Centers for Disease Control and Prevention (CDC) definition of fever which is 100.54F (38C) or higher. Patient should call 24/7 triage at [phone] if experiencing a fever or any  other symptoms MDS/AML N/V/D Fatigue Pneumonitis Anemia Headache Neutropenia Arthralgia  Medication Reconciliation Current Outpatient Medications  Medication Sig Dispense Refill   Calcium Carb-Cholecalciferol (CALCIUM 600+D3 PO) Take 1 tablet by mouth in the morning and at bedtime.     ELDERBERRY PO Take by mouth. Patient report taking 2 gummies daily     Ferrous Sulfate Dried (SLOW RELEASE IRON) 45 MG TBCR Take 45 mg by mouth in the morning.     fexofenadine (ALLEGRA) 180 MG tablet Take 180 mg by mouth daily as needed for allergies.     fluticasone (FLONASE) 50 MCG/ACT nasal spray Place 2 sprays into both nostrils daily. (Patient taking differently: Place 2 sprays into both nostrils daily as needed for allergies.) 16 g 2   ibuprofen (ADVIL) 200 MG tablet Take 400 mg by mouth every Friday. W/Avonex injection     interferon beta-1a (AVONEX PREFILLED) 30 MCG/0.5ML PSKT injection INJECT ONE SYRINGE INTRAMUSCULARLY ONCE WEEKLY. REFRIGERATE. PROTECT FROM LIGHT. ALLOW TO WARM TO ROOM TEMPERATURE PRIOR TO USE. (Patient taking differently: Inject 30 mcg into the muscle every Friday. INJECT ONE SYRINGE INTRAMUSCULARLY ONCE WEEKLY. REFRIGERATE. PROTECT FROM LIGHT. ALLOW TO WARM TO ROOM TEMPERATURE PRIOR TO USE.) 3 each 3   KLOR-CON M20 20 MEQ tablet TAKE 1 TABLET BY MOUTH EVERY DAY IN THE MORNING 30 tablet 1   metoprolol succinate (TOPROL-XL) 25 MG 24 hr tablet Take 25 mg by mouth daily.     Multiple Vitamin (MULTIVITAMIN WITH MINERALS) TABS tablet Take 1 tablet by mouth every evening. Centrum     olaparib (LYNPARZA) 150 MG tablet Take 1 tablet (150 mg total) by mouth 2 (two) times daily. Swallow whole. May take with food to decrease nausea and vomiting. 60 tablet 0   simvastatin (ZOCOR) 20 MG tablet Take 20 mg by mouth every evening.     meclizine (ANTIVERT) 25 MG tablet Take 1 tablet (25 mg total) by mouth 3 (three) times daily as needed for dizziness. (Patient not taking: Reported on 03/06/2021) 30  tablet 0   Omega-3 Fatty Acids (FISH OIL) 1000 MG CPDR Take 1,000 mg by mouth daily. (Patient not taking: Reported on 03/06/2021)     prochlorperazine (COMPAZINE) 10 MG tablet Take 1 tablet (10 mg total) by mouth every 6 (six) hours as needed (Nausea or vomiting). (Patient not taking: Reported on 03/06/2021) 30 tablet 1   VENTOLIN HFA 108 (90 BASE) MCG/ACT inhaler Inhale 1-2 puffs into the lungs every 6 (six) hours as needed for shortness of breath or wheezing. (Patient not taking: Reported on 03/06/2021)     No current facility-administered medications for this visit.    Medication reconciliation is based  on the patient's most recent medication list in the electronic medical record (EMR) including herbal products and OTC medications.   The patient's medication list was reviewed today with the patient? Yes   Drug-drug interactions (DDIs) DDIs were evaluated? Yes Significant DDIs identified? No   Drug-Food Interactions Drug-food interactions were evaluated? Yes Drug-food interactions identified?  Yes, patient was advised to refrain from grapefruit products and [place] / pharmacy visit in 2 weeks and then again in 6 weeks After 1 month, may go up to olaparib 300 mg every 12 hours if tolerated Ask Dr. [name] about potential mammogram as patient inquired that she usually receives them in February  Khadija L Spradley participated in the discussion, expressed understanding, and voiced agreement with the above plan. All questions were answered to her satisfaction. The patient was advised to contact the clinic at [phone] with any questions or concerns prior to her return visit.   I spent 30 minutes assessing the patient.  Raina Mina, RPH-CPP, 03/06/2021 11:26 AM

## 2021-03-07 ENCOUNTER — Other Ambulatory Visit: Payer: Self-pay | Admitting: Hematology and Oncology

## 2021-03-12 ENCOUNTER — Other Ambulatory Visit: Payer: Self-pay | Admitting: Hematology and Oncology

## 2021-03-12 DIAGNOSIS — Z171 Estrogen receptor negative status [ER-]: Secondary | ICD-10-CM

## 2021-03-12 NOTE — Telephone Encounter (Signed)
Hi John, can you please review and refill.  Thanks!

## 2021-03-19 ENCOUNTER — Other Ambulatory Visit: Payer: Self-pay

## 2021-03-19 DIAGNOSIS — C50411 Malignant neoplasm of upper-outer quadrant of right female breast: Secondary | ICD-10-CM

## 2021-03-20 ENCOUNTER — Other Ambulatory Visit: Payer: Self-pay

## 2021-03-20 ENCOUNTER — Inpatient Hospital Stay: Payer: 59 | Admitting: Pharmacist

## 2021-03-20 ENCOUNTER — Inpatient Hospital Stay: Payer: 59 | Attending: Hematology and Oncology

## 2021-03-20 DIAGNOSIS — Z9221 Personal history of antineoplastic chemotherapy: Secondary | ICD-10-CM | POA: Insufficient documentation

## 2021-03-20 DIAGNOSIS — Z79899 Other long term (current) drug therapy: Secondary | ICD-10-CM | POA: Insufficient documentation

## 2021-03-20 DIAGNOSIS — Z923 Personal history of irradiation: Secondary | ICD-10-CM | POA: Insufficient documentation

## 2021-03-20 DIAGNOSIS — C50411 Malignant neoplasm of upper-outer quadrant of right female breast: Secondary | ICD-10-CM | POA: Insufficient documentation

## 2021-03-20 DIAGNOSIS — Z171 Estrogen receptor negative status [ER-]: Secondary | ICD-10-CM | POA: Insufficient documentation

## 2021-03-20 LAB — CMP (CANCER CENTER ONLY)
ALT: 14 U/L (ref 0–44)
AST: 15 U/L (ref 15–41)
Albumin: 4 g/dL (ref 3.5–5.0)
Alkaline Phosphatase: 78 U/L (ref 38–126)
Anion gap: 7 (ref 5–15)
BUN: 10 mg/dL (ref 6–20)
CO2: 28 mmol/L (ref 22–32)
Calcium: 9.3 mg/dL (ref 8.9–10.3)
Chloride: 106 mmol/L (ref 98–111)
Creatinine: 0.63 mg/dL (ref 0.44–1.00)
GFR, Estimated: >60 mL/min (ref >60)
Glucose, Bld: 100 mg/dL — ABNORMAL HIGH (ref 70–99)
Potassium: 3.4 mmol/L — ABNORMAL LOW (ref 3.5–5.1)
Sodium: 141 mmol/L (ref 135–145)
Total Bilirubin: 0.5 mg/dL (ref 0.3–1.2)
Total Protein: 7.4 g/dL (ref 6.5–8.1)

## 2021-03-20 LAB — CBC WITH DIFFERENTIAL (CANCER CENTER ONLY)
Abs Immature Granulocytes: 0.02 10*3/uL (ref 0.00–0.07)
Basophils Absolute: 0 10*3/uL (ref 0.0–0.1)
Basophils Relative: 0 %
Eosinophils Absolute: 0.2 10*3/uL (ref 0.0–0.5)
Eosinophils Relative: 2 %
HCT: 37.4 % (ref 36.0–46.0)
Hemoglobin: 12.2 g/dL (ref 12.0–15.0)
Immature Granulocytes: 0 %
Lymphocytes Relative: 26 %
Lymphs Abs: 2.3 10*3/uL (ref 0.7–4.0)
MCH: 25.8 pg — ABNORMAL LOW (ref 26.0–34.0)
MCHC: 32.6 g/dL (ref 30.0–36.0)
MCV: 79.1 fL — ABNORMAL LOW (ref 80.0–100.0)
Monocytes Absolute: 0.4 10*3/uL (ref 0.1–1.0)
Monocytes Relative: 5 %
Neutro Abs: 5.9 10*3/uL (ref 1.7–7.7)
Neutrophils Relative %: 67 %
Platelet Count: 243 10*3/uL (ref 150–400)
RBC: 4.73 MIL/uL (ref 3.87–5.11)
RDW: 16.9 % — ABNORMAL HIGH (ref 11.5–15.5)
WBC Count: 8.9 10*3/uL (ref 4.0–10.5)
nRBC: 0 % (ref 0.0–0.2)

## 2021-03-20 MED ORDER — OLAPARIB 150 MG PO TABS: 150.0000 mg | ORAL_TABLET | Freq: Two times a day (BID) | ORAL | 0 refills | Status: DC

## 2021-03-20 NOTE — Progress Notes (Signed)
°[facility]  °     Telephone: [phone]?Fax: [phone]  ° °Oncology Clinical Pharmacist Practitioner Progress Note ° °Jacqueline Adams was contacted via in-person visit to discuss her chemotherapy regimen for olaparib which they receive under the care of Dr. Vinay Gudena.  ° °Current treatment regimen and start date °Olaparib (03/03/21) ° °Interval History °She continues on olaparib 150 mg by mouth  every 12 hours on days 1 to 28 of a 28-day cycle. This is being given  monotherapy. Therapy is planned to continue until  one year in the adjuvant setting per the Olympi[employer] trial data .  ° °Response to Therapy °Jacqueline Adams is doing well.She was last seen by clinical pharmacy on January 24.  She reports having some mild nausea but currently is not interested in taking any antinausea medications.  She has been using some antinausea teas that she feels is working.  She will contact us if her nausea gets any worse and she would like something prescribed.  She continues to have fatigue which she feels is stable.  She has been trying to exercise, eating a well-balanced diet, and resting when able.  She continues to take her olaparib at 8:30 AM and 8:30 PM, and she reports missing no doses since the last time we met.  We did discuss potentially going up on the dose to 300 mg every 12 hours, but after careful consideration, she would like to continue on the 150 mg every 12 hours at least for another month.  She will again see clinical pharmacy on March 6.  We will send a refill request today to [facility] for the 150 mg every 12 hour dose, and she knows to continue this dose once her current prescription runs out.  She thinks this will be sometime around February 19.  Her potassium level today was 3.4 mmol/L which is just below the lower limit of normal.  Today we gave written instructions on some foods that she can try that are higher in potassium and we will recheck these labs in 1 month.   If her potassium continues to be low, we can consider increasing her oral potassium at home from 20 mEq daily, to 20 mEq twice daily.  Jacqueline Adams is in agreement with this plan.  Jacqueline Adams also asked about starting physical therapy, which had been on hold until she finished radiation.  She states that her wound has healed and her radiation finished up in November, and so Jacqueline Adams will reach out to physical therapy to see if they would like to start her on her sessions.  Labs, vitals, treatment parameters, and manufacturer guidelines assessing toxicity were reviewed with Jacqueline Adams today. Based on these values, patient is in agreement to continue therapy at this time. ° °Allergies °Allergies  °Allergen Reactions  ° Nabumetone Swelling and Hives  ° Procaine Swelling  ° Shellfish Allergy Anaphylaxis  ° ° °Vitals °Vitals with BMI 03/20/2021 03/06/2021 02/19/2021  °Height - 5' 4" 5' 4"  °Weight 219 lbs 5 oz 219 lbs 2 oz 216 lbs 14 oz  °BMI 37.62 37.59 37.21  °Systolic 122 128 133  °Diastolic 63 64 73  °Pulse 73 95 85  °  ° °Laboratory Data °CBC EXTENDED Latest Ref Rng & Units 03/20/2021 03/06/2021 01/12/2021  °WBC 4.0 - 10.5 K/uL 8.9 9.2 8.0  °RBC 3.87 - 5.11 MIL/uL 4.73 4.54 4.71  °HGB 12.0 - 15.0 g/dL 12.2 11.5(L) 12.3  °HCT 36.0 -   46.0 % 37.4 35.8(L) 37.3  °PLT 150 - 400 K/uL 243 261 300  °NEUTROABS 1.7 - 7.7 K/uL 5.9 5.8 -  °LYMPHSABS 0.7 - 4.0 K/uL 2.3 2.5 -  °  °CMP Latest Ref Rng & Units 03/20/2021 03/06/2021 01/12/2021  °Glucose 70 - 99 mg/dL 100(H) 126(H) 90  °BUN 6 - 20 mg/dL 10 7 6  °Creatinine 0.44 - 1.00 mg/dL 0.63 0.69 0.59  °Sodium 135 - 145 mmol/L 141 143 139  °Potassium 3.5 - 5.1 mmol/L 3.4(L) 3.5 3.5  °Chloride 98 - 111 mmol/L 106 107 102  °CO2 22 - 32 mmol/L 28 27 27  °Calcium 8.9 - 10.3 mg/dL 9.3 9.2 8.9  °Total Protein 6.5 - 8.1 g/dL 7.4 7.5 -  °Total Bilirubin 0.3 - 1.2 mg/dL 0.5 0.3 -  °Alkaline Phos 38 - 126 U/L 78 93 -  °AST 15 - 41 U/L 15 15 -  °ALT 0 - 44 U/L 14 12 -  °  °Lab Results   °Component Value Date  ° MG 1.8 05/22/2020  °  ° °Adverse Effects Assessment °Fatigue: stable °Hypokalemia: just below LLN at 3.4 mmol/L. Given written instruction on some high potassium foods. May increase potassium supplement if needed if potassium continues to be low °Nausea: manageable per Jacqueline Adams.  No vomiting.  She will contact us for prescriptions of ondansetron and prochlorperazine if needed ° °Adherence Assessment °Jacqueline Adams reports missing 0 doses over the past 2 weeks.   °Reason for missed dose: N/A °Patient was re-educated on importance of adherence.  ° °Access Assessment °Jacqueline Adams is currently receiving her olaparib through  [facility]   °Insurance concerns:  none ° °Medication Reconciliation °The patient's medication list was reviewed today with the patient? Yes °New medications or herbal supplements have recently been started? No  °Any medications have been discontinued? No  °The medication list was updated and reconciled based on the patient's most recent medication list in the electronic medical record (EMR) including herbal products and OTC medications.  ° °Medications °Current Outpatient Medications  °Medication Sig Dispense Refill  ° AVONEX PREFILLED 30 MCG/0.5ML PSKT injection INJECT ONE SYRINGE INTRAMUSCULARLY ONCE WEEKLY. REFRIGERATE. PROTECT FROM LIGHT. ALLOW TO WARM TO ROOM TEMPERATURE PRIOR TO USE. 3 each 3  ° Calcium Carb-Cholecalciferol (CALCIUM 600+D3 PO) Take 1 tablet by mouth in the morning and at bedtime.    ° ELDERBERRY PO Take by mouth. Patient report taking 2 gummies daily    ° Ferrous Sulfate Dried (SLOW RELEASE IRON) 45 MG TBCR Take 45 mg by mouth in the morning.    ° fexofenadine (ALLEGRA) 180 MG tablet Take 180 mg by mouth daily as needed for allergies.    ° fluticasone (FLONASE) 50 MCG/ACT nasal spray Place 2 sprays into both nostrils daily. (Patient taking differently: Place 2 sprays into both nostrils daily as needed for allergies.) 16 g  2  ° ibuprofen (ADVIL) 200 MG tablet Take 400 mg by mouth every Friday. W/Avonex injection    ° KLOR-CON M20 20 MEQ tablet TAKE 1 TABLET BY MOUTH EVERY DAY IN THE MORNING 30 tablet 1  ° meclizine (ANTIVERT) 25 MG tablet Take 1 tablet (25 mg total) by mouth 3 (three) times daily as needed for dizziness. (Patient not taking: Reported on 03/06/2021) 30 tablet 0  ° metoprolol succinate (TOPROL-XL) 25 MG 24 hr tablet Take 25 mg by mouth daily.    ° Multiple Vitamin (MULTIVITAMIN WITH MINERALS) TABS tablet Take 1 tablet by mouth every evening.   Centrum    ° olaparib (LYNPARZA) 150 MG tablet Take 1 tablet (150 mg total) by mouth 2 (two) times daily. Swallow whole. May take with food to decrease nausea and vomiting. 60 tablet 0  ° Omega-3 Fatty Acids (FISH OIL) 1000 MG CPDR Take 1,000 mg by mouth daily. (Patient not taking: Reported on 03/06/2021)    ° simvastatin (ZOCOR) 20 MG tablet Take 20 mg by mouth every evening.    ° VENTOLIN HFA 108 (90 BASE) MCG/ACT inhaler Inhale 1-2 puffs into the lungs every 6 (six) hours as needed for shortness of breath or wheezing. (Patient not taking: Reported on 03/06/2021)    ° °No current facility-administered medications for this visit.  ° ° °Drug-Drug Interactions (DDIs) °DDIs were evaluated? Yes °Significant DDIs? No  °The patient was instructed to speak with their health care provider and/or the oral chemotherapy pharmacist before starting any new drug, including prescription or over the counter, natural / herbal products, or vitamins. ° °Supportive Care °Fever: reviewed the importance of having a thermometer and the Centers for Disease Control and Prevention (CDC) definition of fever which is 100.4°F (38°C) or higher. Patient should call 24/7 triage at [phone] if experiencing a fever or any other symptoms °MDS/AML °N/V/D °Fatigue °Pneumonitis °Anemia °Headache °Neutropenia °Arthralgia ° °Dosing Assessment °Hepatic adjustments needed? No  °Renal adjustments needed? No  °Toxicity  adjustments needed? No  °The current dosing regimen is appropriate to continue at this time. ° °Follow-Up Plan °Monitor fatigue °Monitor potassium, try to incorporate higher potassium foods °Monitor nausea °[facility] ref[facility]/21 °Labs / pharmacy clinic visit on 04/16/21 = may increase dose at this visit °Labs / Dr. Gudena visit on 05/14/21 ° °Jacqueline Adams participated in the discussion, expressed understanding, and voiced agreement with the above plan. All questions were answered to her satisfaction. The patient was advised to contact the clinic at [phone] with any questions or concerns prior to her return visit.  ° °I spent 30 minutes assessing and educating the patient. ° °Jacqueline Adams, RPH-CPP, 03/20/2021  9:13 AM  ° °**Disclaimer: This note was dictated with voice recognition software. Similar sounding words can inadvertently be transcribed and this note may contain transcription errors which may not have been corrected upon publication of note.** ° °

## 2021-03-22 ENCOUNTER — Inpatient Hospital Stay (HOSPITAL_BASED_OUTPATIENT_CLINIC_OR_DEPARTMENT_OTHER): Payer: 59 | Admitting: Adult Health

## 2021-03-22 ENCOUNTER — Other Ambulatory Visit: Payer: Self-pay

## 2021-03-22 ENCOUNTER — Ambulatory Visit (HOSPITAL_COMMUNITY)
Admission: RE | Admit: 2021-03-22 | Discharge: 2021-03-22 | Disposition: A | Discharge status: Home / Self Care | Payer: 59 | Source: Ambulatory Visit | Attending: Adult Health | Admitting: Adult Health

## 2021-03-22 ENCOUNTER — Telehealth: Payer: Self-pay | Admitting: Pharmacist

## 2021-03-22 ENCOUNTER — Encounter: Payer: Self-pay | Admitting: Adult Health

## 2021-03-22 ENCOUNTER — Telehealth: Payer: Self-pay | Admitting: *Deleted

## 2021-03-22 VITALS — BP 132/61 | HR 107 | Temp 97.9°F | Resp 18 | Ht 64.0 in | Wt 219.6 lb

## 2021-03-22 DIAGNOSIS — Z171 Estrogen receptor negative status [ER-]: Secondary | ICD-10-CM | POA: Diagnosis not present

## 2021-03-22 DIAGNOSIS — M7989 Other specified soft tissue disorders: Secondary | ICD-10-CM

## 2021-03-22 DIAGNOSIS — L538 Other specified erythematous conditions: Secondary | ICD-10-CM

## 2021-03-22 DIAGNOSIS — M79662 Pain in left lower leg: Secondary | ICD-10-CM | POA: Diagnosis not present

## 2021-03-22 DIAGNOSIS — C50411 Malignant neoplasm of upper-outer quadrant of right female breast: Secondary | ICD-10-CM

## 2021-03-22 MED ORDER — AMOXICILLIN-POT CLAVULANATE 875-125 MG PO TABS: 1.0000 | ORAL_TABLET | Freq: Two times a day (BID) | ORAL | 0 refills | Status: DC

## 2021-03-22 NOTE — Progress Notes (Signed)
[facility] Follow up:    Jacqueline Peers, MD [address] [place] [address]   DIAGNOSIS:  Cancer Staging  Malignant neoplasm of upper-outer quadrant of right breast in female, estrogen receptor negative ([place]) Staging form: Breast, AJCC 8th Edition - Clinical stage from 01/12/2020: Stage IB (cT1c, cN0, cM0, G3, ER-, PR-, HER2-) - Signed by Nicholas Lose, MD on 02/03/2020 Stage prefix: Initial diagnosis Method of lymph node assessment: Clinical Histologic grading system: 3 grade system   SUMMARY OF ONCOLOGIC HISTORY: Oncology History  Malignant neoplasm of upper-outer quadrant of right breast in female, estrogen receptor negative ([place])  01/10/2020 Initial Diagnosis   Palpable right breast mass. Mammogram showed a 1.8cm mass at the 10:30 position 2cm from the nipple, a 1.5cm mass at the 10:30 position 7cm from the nipple, and indeterminate calcifications between the two masses in the right breast. 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    01/12/2020 Cancer Staging   Staging form: Breast, AJCC 8th Edition - Clinical stage from 01/12/2020: Stage IB (cT1c, cN0, cM0, G3, ER-, PR-, HER2-) - Signed by Nicholas Lose, MD on 02/03/2020    02/23/2020 - 07/05/2020 Chemotherapy   Patient is on Treatment Plan : BREAST Dose Dense AC q14d / CARBOplatin D1 + PACLitaxel D1,8,15 q21d     08/08/2020 Surgery   Right breast lumpectomy: scattered foci of DCIS with calcifications, intermediate to high-grade, largest measuring 0.3 cm, no residual invasive carcinoma, resection margins negative for DCIS, and all 3 axillary nodes negative for carcinoma   11/21/2020 - 12/18/2020 Radiation Therapy   11/21/2020 through 12/18/2020 Site Technique Total Dose (Gy) Dose per Fx (Gy) Completed Fx Beam Energies  Breast, Right: Breast_Rt 3D 42.56/42.56 2.66 16/16 10X  Breast, Right: Breast_Rt_Bst 3D 8/8 2 4/4  6X, 10X      CURRENT THERAPY: Jacqueline Adams  INTERVAL HISTORY: Jacqueline Adams 61 y.o. female returns for urgent evaluation of left leg swelling and redness.  She notes that she pulled her sock up and noted that she had some warmth redness and swelling in her left lower leg.  She is unclear when this began.  She says that she is also started to get some redness on her right inner lower leg as well.  Jacqueline Adams has been taking [place] for the past 2 weeks.  She denies any other significant issues today.   Patient Active Problem List   Diagnosis Date Noted   Bilateral ovarian cysts    Port-A-Cath in place 04/05/2020   Malignant neoplasm of upper-outer quadrant of right breast in female, estrogen receptor negative ([place]) 01/10/2020   High risk medication use 03/25/2019   Microcytic anemia 12/31/2017   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 UACS 12/16/2017   History of pneumonia 11/12/2017   Morbid obesity ([place]) 08/19/2016   Numbness 05/09/2015   Other fatigue 05/09/2015   Arthralgia of hip 11/16/2014   Low back pain with sciatica 11/16/2014   Urinary frequency 11/16/2014   Encounter for general adult medical examination without abnormal findings 06/07/2014   DOE (dyspnea on exertion) 04/27/2014   Atypical chest pain 04/27/2014   Dyslipidemia 04/27/2014   HLD (hyperlipidemia) 02/14/2014   Cervical spondylosis without myelopathy 07/28/2013   Cervical pain 07/28/2013   CFIDS (chronic fatigue and immune dysfunction syndrome) ([place]) 07/28/2013   Chronic infection of sinus 07/28/2013   Headache, migraine 07/28/2013   DS (disseminated sclerosis) ([place]) 07/28/2013  Spasms of the hands or feet 07/28/2013   DDD (degenerative disc disease), cervical 07/28/2013   Menopausal symptom 07/28/2013   Taking drug for chronic disease 07/28/2013   Vitamin D deficiency 07/28/2013   Degeneration of intervertebral disc of cervical region 07/28/2013   Cough 04/06/2013   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  MEDICAL HISTORY: Past Medical History:  Diagnosis Date   BRCA2 gene mutation positive in female    Breast cancer ([place]) 01/2020   right breast IDC   Bronchitis    Cervical spondylarthritis    Headache(784.0)    High cholesterol    Hypertension    Leukocytosis 2009   Multiple sclerosis ([place]) 2009   followed by Dr. Erling Cruz   Neuromuscular disorder [facility])    Pneumonia    Rhinitis    Seasonal allergies    Sinusitis     SURGICAL HISTORY: Past Surgical History:  Procedure Laterality Date   ABDOMINAL HYSTERECTOMY  2006   uterus, cervix, portion of fallopian tubes for AUB   BREAST LUMPECTOMY WITH RADIOACTIVE SEED AND SENTINEL LYMPH NODE BIOPSY Right 08/08/2020   Procedure: RIGHT BREAST LUMPECTOMY WITH RADIOACTIVE SEED X 3 AND SENTINEL LYMPH NODE BIOPSY;  Surgeon: Stark Klein, MD;  Location: [facility];  Service: General;  Laterality: Right;   CESAREAN SECTION     x 3   CORRECTION HAMMER TOE Bilateral    PORT-A-CATH REMOVAL Right 09/21/2020   Procedure: REMOVAL PORT-A-CATH;  Surgeon: Stark Klein, MD;  Location: [facility];  Service: General;  Laterality: Right;   PORTACATH PLACEMENT Right 02/22/2020   Procedure: RIGHT SUBCLAVIAN VEIN PORT PLACEMENT;  Surgeon: Donnie Mesa, MD;  Location: [facility];  Service: General;  Laterality: Right;   ROBOTIC ASSISTED BILATERAL SALPINGO OOPHERECTOMY Bilateral 01/30/2021   Procedure: XI ROBOTIC ASSISTED BILATERAL SALPINGO OOPHORECTOMY, LYSIS OF ADHESIONS, 1 HOUR;  Surgeon: Lafonda Mosses, MD;  Location: [facility];  Service: Gynecology;  Laterality: Bilateral;    SOCIAL HISTORY: Social History   Socioeconomic History   Marital status: Married    Spouse name: John   Number of children: 2   Years of education: [employer] education level: Not on file   Occupational History   Occupation: Print production planner: [employer]  Tobacco Use   Smoking status: Never   Smokeless tobacco: Never  Vaping Use   Vaping Use: Never used  Substance and Sexual Activity   Alcohol use: No   Drug use: No   Sexual activity: Yes    Birth control/protection: Surgical  Other Topics Concern   Not on file  Social History Narrative   Patient lives at home with her spouse.   Caffeine Use: 2 sodas daily   Social Determinants of Health   Financial Resource Strain: Not on file  Food Insecurity: Not on file  Transportation Needs: Not on file  Physical Activity: Not on file  Stress: Not on file  Social Connections: Not on file  Intimate Partner Violence: Not on file    FAMILY HISTORY: Family History  Problem Relation Age of Onset   Cancer Mother 71       ovarian cancer; now with suspected breast cancer   Diabetes Mother        also HTN   Breast cancer Mother  Clotting disorder Father    Prostate cancer Father    Colon cancer Sister 31       anal cancer (2014)   Lung cancer Sister 58   Stroke Sister 52   Ovarian cancer Sister 14   BRCA 1/2 Sister    Breast cancer Maternal Aunt    Breast cancer Maternal Aunt    Breast cancer Maternal Aunt    Cancer Maternal Grandmother        unknown female reproductive organ cancer   Endometrial cancer Niece    Pancreatic cancer Neg Hx     Review of Systems  Constitutional:  Negative for appetite change, chills, fatigue, fever and unexpected weight change.  HENT:   Negative for hearing loss, lump/mass and trouble swallowing.   Eyes:  Negative for eye problems and icterus.  Respiratory:  Negative for chest tightness, cough and shortness of breath.   Cardiovascular:  Negative for chest pain, leg swelling and palpitations.  Gastrointestinal:  Negative for abdominal distention, abdominal pain, constipation, diarrhea, nausea and vomiting.  Endocrine: Negative for hot flashes.  Genitourinary:  Negative  for difficulty urinating.   Musculoskeletal:  Negative for arthralgias.  Skin:  Negative for itching and rash.  Neurological:  Negative for dizziness, extremity weakness, headaches and numbness.  Hematological:  Negative for adenopathy. Does not bruise/bleed easily.  Psychiatric/Behavioral:  Negative for depression. The patient is not nervous/anxious.      PHYSICAL EXAMINATION  ECOG PERFORMANCE STATUS: 1 - Symptomatic but completely ambulatory  Vitals:   03/22/21 1531  BP: 132/61  Pulse: (!) 107  Resp: 18  Temp: 97.9 F (36.6 C)  SpO2: 97%    Physical Exam Constitutional:      General: She is not in acute distress.    Appearance: Normal appearance. She is not toxic-appearing.  HENT:     Head: Normocephalic and atraumatic.  Eyes:     General: No scleral icterus. Cardiovascular:     Rate and Rhythm: Normal rate and regular rhythm.     Pulses: Normal pulses.     Heart sounds: Normal heart sounds.  Pulmonary:     Effort: Pulmonary effort is normal.     Breath sounds: Normal breath sounds.  Abdominal:     General: Abdomen is flat. Bowel sounds are normal. There is no distension.     Palpations: Abdomen is soft.     Tenderness: There is no abdominal tenderness.  Musculoskeletal:        General: No swelling.     Cervical back: Neck supple.  Lymphadenopathy:     Cervical: No cervical adenopathy.  Skin:    General: Skin is warm and dry.     Findings: No rash.     Comments: Left leg with erythema and warmth in in a symmetric not well-circumscribed area of about 3 to 5 cm on her left lateral lower leg.  There are some slight erythema on the inner portion of her lower leg.  Mild swelling was noted.  Neurological:     General: No focal deficit present.     Mental Status: She is alert.  Psychiatric:        Mood and Affect: Mood normal.        Behavior: Behavior normal.    LABORATORY DATA:  CBC    Component Value Date/Time   WBC 8.9 03/20/2021 0828   WBC 8.0  01/12/2021 1339   RBC 4.73 03/20/2021 0828   HGB 12.2 03/20/2021 0828   HGB 12.4 09/22/2019 0840  HGB 11.5 (L) 11/22/2013 1514   HCT 37.4 03/20/2021 0828   HCT 30.0 (L) 05/05/2020 1625   HCT 36.5 11/22/2013 1514   PLT 243 03/20/2021 0828   PLT 329 09/22/2019 0840   MCV 79.1 (L) 03/20/2021 0828   MCV 78 (L) 09/22/2019 0840   MCV 75.4 (L) 11/22/2013 1514   MCH 25.8 (L) 03/20/2021 0828   MCHC 32.6 03/20/2021 0828   RDW 16.9 (H) 03/20/2021 0828   RDW 16.0 (H) 09/22/2019 0840   RDW 16.5 (H) 11/22/2013 1514   LYMPHSABS 2.3 03/20/2021 0828   LYMPHSABS 3.8 (H) 09/22/2019 0840   LYMPHSABS 4.7 (H) 11/22/2013 1514   MONOABS 0.4 03/20/2021 0828   MONOABS 0.8 11/22/2013 1514   EOSABS 0.2 03/20/2021 0828   EOSABS 0.2 09/22/2019 0840   BASOSABS 0.0 03/20/2021 0828   BASOSABS 0.1 09/22/2019 0840   BASOSABS 0.1 11/22/2013 1514    CMP     Component Value Date/Time   NA 141 03/20/2021 0828   NA 142 05/05/2020 1625   NA 143 11/22/2013 1514   K 3.4 (L) 03/20/2021 0828   K 3.1 (L) 11/22/2013 1514   CL 106 03/20/2021 0828   CL 105 02/18/2012 1207   CO2 28 03/20/2021 0828   CO2 25 11/22/2013 1514   GLUCOSE 100 (H) 03/20/2021 0828   GLUCOSE 105 11/22/2013 1514   GLUCOSE 96 02/18/2012 1207   BUN 10 03/20/2021 0828   BUN 8 05/05/2020 1625   BUN 6.6 (L) 11/22/2013 1514   CREATININE 0.63 03/20/2021 0828   CREATININE 0.8 11/22/2013 1514   CALCIUM 9.3 03/20/2021 0828   CALCIUM 9.0 11/22/2013 1514   PROT 7.4 03/20/2021 0828   PROT 7.0 09/22/2019 0840   PROT 7.4 11/22/2013 1514   ALBUMIN 4.0 03/20/2021 0828   ALBUMIN 4.0 09/22/2019 0840   ALBUMIN 3.3 (L) 11/22/2013 1514   AST 15 03/20/2021 0828   AST 15 11/22/2013 1514   ALT 14 03/20/2021 0828   ALT 13 11/22/2013 1514   ALKPHOS 78 03/20/2021 0828   ALKPHOS 91 11/22/2013 1514   BILITOT 0.5 03/20/2021 0828   BILITOT 0.23 11/22/2013 1514   GFRNONAA >60 03/20/2021 0828   GFRAA 113 09/22/2019 0840      ASSESSMENT and THERAPY PLAN:    Malignant neoplasm of upper-outer quadrant of right breast in female, estrogen receptor negative ([place]) 01/10/2020:Palpable right breast mass. Mammogram showed a 1.8cm mass at the 10:30 position 2cm from the nipple, a 1.5cm mass at the 10:30 position 7cm from the nipple, and indeterminate calcifications between the two masses in the right breast. 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  BRCA2 Mutation   Treatment plan: 1. Neoadjuvant chemotherapy with Adriamycin and Cytoxan every 2 weeks 4 followed by Taxol weekly 12 with carboplatin and  every 3 weeks x4 completed 07/05/2020 2. 08/08/20 breast conserving surgery with sentinel lymph node study : scattered foci of DCIS, margins Neg, 0/3 LN Neg 3. Adjuvant radiation therapy 11/22/20- 12/18/20 4. Adjuvant olaparib URCC nausea study   Breast MRI 01/14/2020: 2 cm spiculated mass UOQ right breast, 2.1 cm spiculated mass UOQ right breast, biopsy-proven malignancies.  7 mm suspicious enhancing mass: Biopsy 02/02/2020: High-grade DCIS ----------------------------------------------------------------------------------------------------------------------------------------------- Treatment: lynparza  Jacqueline Adams is continued on the [place]  I reviewed with her that her left lower leg swelling to be 2 different things.  1 it could be blood clot if that is the case we would  start her on a blood thinner with Xarelto to help the blood clot dissolved.  I have placed an order for Doppler to be done on her left leg.  If the Doppler is negative then she likely has an early cellulitis on her left leg.  We will know the results of the Doppler when she has it completed if the Doppler is negative we will send her Augmentin twice daily.  She requested Diflucan with her antibiotics.  Unfortunately there is a moderate interaction between the Diflucan and the [place]  Instead she would have to take  Ibrexafungerp.  We can give this to her once she develops symptoms as it is newer to the market and may be more costly.  All questions were answered. The patient knows to call the clinic with any problems, questions or concerns. We can certainly see the patient much sooner if necessary.  Total encounter time: 20 minutes in face-to-face visit time, chart review, lab review, care coordination, order entry, coordination with the nurses, and documentation of the encounter.  Wilber Bihari, NP 03/22/21 9:27 PM Medical Oncology and Hematology [facility] [place], [place] [address] Tel. [phone]    Fax. [phone]  *Total Encounter Time as defined by the Centers for Medicare and Medicaid Services includes, in addition to the face-to-face time of a patient visit (documented in the note above) non-face-to-face time: obtaining and reviewing outside history, ordering and reviewing medications, tests or procedures, care coordination (communications with other health care professionals or caregivers) and documentation in the medical record.

## 2021-03-22 NOTE — Telephone Encounter (Signed)
Received call from pt with complain of new nodule on left ankle.  Pt states nodule appeared yesterday after wearing a pair of tighter socks.  Pt denies redness, warmth, or pain.  Per MD pt needing to be seen in [facility] today for further evaluation. Appt scheduled and pt verbalized understanding of date and time.

## 2021-03-22 NOTE — Progress Notes (Signed)
LLE venous duplex has been completed. Preliminary results given to [facility] at the office of Gardenia Phlegm, NP.   Results can be found under chart review under CV PROC. 03/22/2021 4:26 PM Jazzlyn Huizenga RVT, RDMS

## 2021-03-22 NOTE — Telephone Encounter (Signed)
[facility]       Telephone: [phone]?Fax: [phone]   Oncology Clinical Pharmacist Practitioner Progress Note  Jacqueline Adams is a 61 y.o. female with a diagnosis of breast cancer currently on adjuvant olaparib under the care of Dr. Nicholas Lose.   Ms. Giambra contacted clinical pharmacy today to discuss a new knot on her leg that she describes as being just above her left outside ankle area.  She noticed this knot when taking off her socks yesterday evening.  She reports that there does not appear to be any swelling, redness, and it is not warm to touch.  We last saw Ms. Venditto on February 7 and because she is on olaparib which has a potential for VTE, clinical pharmacy discussed the symptoms with Dr. [name] today.  Although the suspicion is low for DVT, Dr. [name] thought it would be best to have her seen in clinic today so that she can be evaluated out of an abundance of caution.  Ms. Artist is in agreement with this plan.  Notified Dr. Geralyn Flash nurse and she will be reaching out to Ms. Coleman to schedule an appointment today with either Dr. [name] or nurse practitioner Thedore Mins.  Ms. Melrose tentatively has labs and a visit with clinical pharmacy scheduled for March 6.  She knows to call with any questions or concerns in the interim.  Clinical pharmacy will continue to Cook and Dr. Nicholas Lose as needed.  Raina Mina, RPH-CPP,  03/22/2021  9:47 AM

## 2021-03-22 NOTE — Assessment & Plan Note (Signed)
01/10/2020:Palpable right breast mass. Mammogram showed a 1.8cm mass at the 10:30 position 2cm from the nipple, a 1.5cm mass at the 10:30 position 7cm from the nipple, and indeterminate calcifications between the two masses in the right breast. 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 BRCA2Mutation  Treatment plan: 1. Neoadjuvant chemotherapy with Adriamycin and Cytoxan every2weeks4 followed by Taxol weekly 12 with carboplatinand every 3 weeks x4completed 07/05/2020 2.6/28/22breast conserving surgery with sentinel lymph node study : scattered foci of DCIS, margins Neg, 0/3 LN Neg 3. Adjuvant radiation therapy 11/22/20- 12/18/20 4.Adjuvant olaparib URCC nausea study  Breast MRI12/04/2019: 2 cm spiculated mass UOQ right breast, 2.1 cm spiculated mass UOQ right breast, biopsy-proven malignancies. 7 mm suspicious enhancing mass: Biopsy 02/02/2020: High-grade DCIS ----------------------------------------------------------------------------------------------------------------------------------------------- Treatment: lynparza  Jacqueline Adams is continued on the [place]  I reviewed with her that her left lower leg swelling to be 2 different things.  1 it could be blood clot if that is the case we would start her on a blood thinner with Xarelto to help the blood clot dissolved.  I have placed an order for Doppler to be done on her left leg.  If the Doppler is negative then she likely has an early cellulitis on her left leg.  We will know the results of the Doppler when she has it completed if the Doppler is negative we will send her Augmentin twice daily.  She requested Diflucan with her antibiotics.  Unfortunately there is a moderate interaction between the Diflucan and the [place]  Instead she would have to take Ibrexafungerp.  We can give this to her once she develops symptoms as it is newer to the market and may be more  costly.

## 2021-03-27 ENCOUNTER — Telehealth: Payer: Self-pay | Admitting: Hematology and Oncology

## 2021-03-27 NOTE — Telephone Encounter (Signed)
Rescheduled appointment per provider request. Patient is aware of the changes made to her appointment. Patient will be mailed an updated calendar.

## 2021-04-01 ENCOUNTER — Other Ambulatory Visit: Payer: Self-pay | Admitting: Hematology and Oncology

## 2021-04-02 ENCOUNTER — Ambulatory Visit
Admission: RE | Admit: 2021-04-02 | Discharge: 2021-04-02 | Disposition: A | Discharge status: Home / Self Care | Payer: 59 | Source: Ambulatory Visit | Attending: Hematology and Oncology | Admitting: Hematology and Oncology

## 2021-04-02 ENCOUNTER — Other Ambulatory Visit: Payer: Self-pay

## 2021-04-02 DIAGNOSIS — C50411 Malignant neoplasm of upper-outer quadrant of right female breast: Secondary | ICD-10-CM

## 2021-04-02 DIAGNOSIS — Z171 Estrogen receptor negative status [ER-]: Secondary | ICD-10-CM

## 2021-04-06 ENCOUNTER — Other Ambulatory Visit: Payer: Self-pay

## 2021-04-06 ENCOUNTER — Telehealth: Payer: Self-pay

## 2021-04-06 DIAGNOSIS — C50411 Malignant neoplasm of upper-outer quadrant of right female breast: Secondary | ICD-10-CM

## 2021-04-06 DIAGNOSIS — Z171 Estrogen receptor negative status [ER-]: Secondary | ICD-10-CM

## 2021-04-06 NOTE — Telephone Encounter (Signed)
Called pt and informed her that per MD new referral placed for PT to eval and tx her lymphedema.  Pt verbalized understanding and thanks

## 2021-04-09 ENCOUNTER — Other Ambulatory Visit: Payer: Self-pay | Admitting: Hematology and Oncology

## 2021-04-09 DIAGNOSIS — C50411 Malignant neoplasm of upper-outer quadrant of right female breast: Secondary | ICD-10-CM

## 2021-04-09 DIAGNOSIS — Z171 Estrogen receptor negative status [ER-]: Secondary | ICD-10-CM

## 2021-04-09 NOTE — Telephone Encounter (Signed)
John, you see her on 3/6 if you review and refill. Thanks!

## 2021-04-13 ENCOUNTER — Other Ambulatory Visit: Payer: Self-pay | Admitting: *Deleted

## 2021-04-13 ENCOUNTER — Ambulatory Visit: Payer: 59 | Attending: Hematology and Oncology | Admitting: Rehabilitation

## 2021-04-13 ENCOUNTER — Other Ambulatory Visit: Payer: Self-pay

## 2021-04-13 ENCOUNTER — Encounter: Payer: Self-pay | Admitting: Rehabilitation

## 2021-04-13 DIAGNOSIS — Z483 Aftercare following surgery for neoplasm: Secondary | ICD-10-CM | POA: Diagnosis present

## 2021-04-13 DIAGNOSIS — M25611 Stiffness of right shoulder, not elsewhere classified: Secondary | ICD-10-CM | POA: Insufficient documentation

## 2021-04-13 DIAGNOSIS — R293 Abnormal posture: Secondary | ICD-10-CM | POA: Insufficient documentation

## 2021-04-13 DIAGNOSIS — Z171 Estrogen receptor negative status [ER-]: Secondary | ICD-10-CM

## 2021-04-13 DIAGNOSIS — C50411 Malignant neoplasm of upper-outer quadrant of right female breast: Secondary | ICD-10-CM | POA: Diagnosis not present

## 2021-04-13 NOTE — Patient Instructions (Signed)
Self manual lymph drainage: ?Perform this sequence once a day.  Only give enough pressure no your skin to make the skin move. ? ?Diaphragmatic - Supine ? ? ?Inhale through nose making navel move out toward hands. Exhale through puckered lips, hands follow navel in. ?  ? ?Copyright ? VHI. All rights reserved.  ?Hug yourself.  Do circles at your neck just above your collarbones.  Repeat this 10 times. ? ?Axilla - One at a Time ? ? ?Using full weight of flat hand and fingers at center of uninvolved armpit, make _10__ in-place circles.  ? ?Copyright ? VHI. All rights reserved.  ?LEG: Inguinal Nodes Stimulation ? ? ?With small finger side of hand against hip crease on involved side, gently perform circles at the crease. ?Repeat __10_ times.  ? ?Copyright ? VHI. All rights reserved.  ?Axilla to Inguinal Nodes - Sweep ? ? ?On involved side, sweep _4__ times from armpit along side of trunk to hip crease. ? ?Now gently stretch skin from the involved side to the uninvolved side across the chest at the shoulder line.  Repeat that 4 times. ? ?Draw an imaginary diagonal line from upper outer breast through the nipple area toward lower inner breast.  Direct fluid upward and inward from this line toward the pathway across your upper chest .  Do this in three rows to treat all of the upper inner breast tissue, and do each row 3-4x. ?     Direct fluid to treat all of lower outer breast tissue downward and outward toward      pathway that is aimed at the left groin. ? ? ? ? ? ? ? ? ? ? ? ? ? ? ? ? ? ? ? ? ? ? ? ? ? ? ? ? ?Finish by doing the pathways as described above going from your involved armpit to the same side groin and going across your upper chest from the involved shoulder to the uninvolved shoulder. ? ?Repeat the steps above where you do circles in your left groin and right armpit. ?Copyright ? VHI. All rights reserved.   ?

## 2021-04-13 NOTE — Therapy (Signed)
OUTPATIENT PHYSICAL THERAPY ONCOLOGY EVALUATION  Patient Name: Jacqueline Adams MRN: [id number] DOB:02/04/1961, 61 y.o., female Today's Date: 04/13/2021   PT End of Session - 04/13/21 0758     Visit Number 7    Number of Visits 15    Date for PT Re-Evaluation 05/25/21   due to busy CR schedule   Authorization - Number of Visits 30    PT Start Time 0805    PT Stop Time 9675    PT Time Calculation (min) 54 min    Activity Tolerance Patient tolerated treatment well    Behavior During Therapy [facility] for tasks assessed/performed             Past Medical History:  Diagnosis Date   BRCA2 gene mutation positive in female    Breast cancer ([place]) 01/2020   right breast IDC   Bronchitis    Cervical spondylarthritis    Headache(784.0)    High cholesterol    Hypertension    Leukocytosis 2009   Multiple sclerosis ([place]) 2009   followed by Dr. Erling Cruz   Neuromuscular disorder [facility])    Pneumonia    Rhinitis    Seasonal allergies    Sinusitis    Past Surgical History:  Procedure Laterality Date   ABDOMINAL HYSTERECTOMY  2006   uterus, cervix, portion of fallopian tubes for AUB   BREAST LUMPECTOMY WITH RADIOACTIVE SEED AND SENTINEL LYMPH NODE BIOPSY Right 08/08/2020   Procedure: RIGHT BREAST LUMPECTOMY WITH RADIOACTIVE SEED X 3 AND SENTINEL LYMPH NODE BIOPSY;  Surgeon: Stark Klein, MD;  Location: [place];  Service: General;  Laterality: Right;   CESAREAN SECTION     x 3   CORRECTION HAMMER TOE Bilateral    PORT-A-CATH REMOVAL Right 09/21/2020   Procedure: REMOVAL PORT-A-CATH;  Surgeon: Stark Klein, MD;  Location: [place];  Service: General;  Laterality: Right;   PORTACATH PLACEMENT Right 02/22/2020   Procedure: RIGHT SUBCLAVIAN VEIN PORT PLACEMENT;  Surgeon: Donnie Mesa, MD;  Location: [place];  Service: General;  Laterality: Right;   ROBOTIC ASSISTED BILATERAL SALPINGO OOPHERECTOMY Bilateral 01/30/2021   Procedure: XI ROBOTIC ASSISTED BILATERAL SALPINGO  OOPHORECTOMY, LYSIS OF ADHESIONS, 1 HOUR;  Surgeon: Lafonda Mosses, MD;  Location: [facility];  Service: Gynecology;  Laterality: Bilateral;   Patient Active Problem List   Diagnosis Date Noted   Bilateral ovarian cysts    Port-A-Cath in place 04/05/2020   Malignant neoplasm of upper-outer quadrant of right breast in female, estrogen receptor negative ([place]) 01/10/2020   High risk medication use 03/25/2019   Microcytic anemia 12/31/2017   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 UACS 12/16/2017   History of pneumonia 11/12/2017   Morbid obesity ([place]) 08/19/2016   Numbness 05/09/2015   Other fatigue 05/09/2015   Arthralgia of hip 11/16/2014   Low back pain with sciatica 11/16/2014   Urinary frequency 11/16/2014   Encounter for general adult medical examination without abnormal findings 06/07/2014   DOE (dyspnea on exertion) 04/27/2014   Atypical chest pain 04/27/2014   Dyslipidemia 04/27/2014   HLD (hyperlipidemia) 02/14/2014   Cervical spondylosis without myelopathy 07/28/2013   Cervical pain 07/28/2013   CFIDS (chronic fatigue and immune dysfunction syndrome) ([place]) 07/28/2013   Chronic infection of sinus 07/28/2013   Headache, migraine 07/28/2013   DS (disseminated sclerosis) ([place]) 07/28/2013   Spasms of the hands or feet 07/28/2013   DDD (degenerative disc disease), cervical 07/28/2013   Menopausal symptom 07/28/2013   Taking drug for chronic disease 07/28/2013   Vitamin  D deficiency 07/28/2013   Degeneration of intervertebral disc of cervical region 07/28/2013   Cough 04/06/2013   BRCA gene mutation positive in female 06/12/2011   Genetic susceptibility to malignant neoplasm of breast 06/12/2011   Multiple sclerosis ([place])    Leukocytosis     PCP: Robyne Peers, MD  REFERRING PROVIDER: Nicholas Lose, MD  REFERRING DIAG: Rt breast cancer  THERAPY DIAG:  Malignant neoplasm of upper-outer quadrant of right breast in female, estrogen receptor negative ([place])  ONSET DATE:  08/08/20  SUBJECTIVE                                                                                                                                                                                           SUBJECTIVE STATEMENT: It still feels a little thick, a little tender in the breast. 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 She underwent neoadjuvant chemotherapy 02/23/2020 - 07/05/2020 followed by a right lumpectomy and sentinel node biopsy (3 negative nodes) on 08/08/2020. Completed radiation.  She has relapsing remitting multiple sclerosis since 2009.   PAIN:  Are you having pain? No  PRECAUTIONS: Other: lymphedema risk Rt UE  WEIGHT BEARING RESTRICTIONS No  FALLS:  Has patient fallen in last 6 months? 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  Fibrosis medial inferior edge.  Overall breast is not very swollen.   OBSERVATIONS / OTHER ASSESSMENTS: appearance of tight lumpectomy scar tissue, enlarged pores, darkened skin post radiation  SENSATION:  Light touch: Deficits in the Rt axilla   POSTURE: forward shoulders, rounded shoulders  UPPER EXTREMITY AROM/PROM:   A/PROM 09/07/20 RIGHT  04/13/2021   Shoulder extension 46 55  Shoulder flexion 123 162  Shoulder abduction 140 165  Shoulder internal  rotation 50   Shoulder external rotation 70 85    (Blank rows = not tested)  Left: Flex: 162  Abd: 165  ER 85   UPPER EXTREMITY STRENGTH:  Shoulder Rt  Lt Flex  5  5  Abd  5  5 ER  5  5 IR  5  5    L-DEX LYMPHEDEMA SCREENING Measurement Type: Unilateral L-DEX MEASUREMENT EXTREMITY: Upper Extremity POSITION : Standing DOMINANT SIDE: Right At Risk Side: Right BASELINE SCORE (UNILATERAL): -0.3 L-DEX SCORE (UNILATERAL): 0.1 VALUE CHANGE (UNILAT): 0.4    TODAY'S TREATMENT  04/13/21 Performed LDEX which remains WNL and scheduled out for 3 months In supine: reviewed self MLD for the Rt breast with new handout given with notes made as needed.  PT performed each step and then pt performed with hand over hand tcs and vcs.  Pt has been educated on this before, so this was overall a reminder but cueing still needed for each step.   Gave pt small chip foam seroma pack to wear in compression bra  PATIENT EDUCATION:  Education details: see treatment note Person educated: Patient Education method: Explanation, Demonstration, Tactile cues, Verbal cues, and Handouts Education comprehension: verbalized understanding, returned demonstration, verbal cues required, tactile cues required, and needs further education   HOME EXERCISE PROGRAM: Self MLD - see bottom of note  ASSESSMENT:  CLINICAL IMPRESSION: Patient is a 61 y.o. female know to this clinic who was seen today for physical therapy evaluation and treatment for her Rt breast seroma and fibrosis as well as muscular tightness mostly in the pectoralis with AROM.  Pt has signs of hard seroma edges post radiation but no significant general breast swelling.  Pt also has scar tissue restrictions and tenderness. Pt will be seen 2x per week x 4 weeks to decrease Rt breast firmness and increase activity.     OBJECTIVE IMPAIRMENTS increased fascial restrictions and impaired sensation.   ACTIVITY LIMITATIONS  none .   PERSONAL FACTORS  Fitness and 1-2 comorbidities: SLNB, radiation  are also affecting patient's functional outcome.    REHAB POTENTIAL: Excellent  CLINICAL DECISION MAKING: Stable/uncomplicated  EVALUATION COMPLEXITY: Low  GOALS: Goals reviewed with patient? Yes  LONG TERM GOALS:  Pt will be ind with self MLD for the Rt breast Baseline: needs reeducation Target date: 05/25/2021 Goal status: INITIAL  2.  Pt will be ind with home stretches post radiation to decrease stiffness Baseline: no HEP Target date: 05/25/21  Goal status: INITIAL  PLAN: PT FREQUENCY: 2x/week  PT DURATION: 6 weeks  PLANNED INTERVENTIONS: Therapeutic exercises, Patient/Family education, Joint mobilization, Manual lymph drainage, and Manual therapy  PLAN FOR NEXT SESSION: review self MLD in seated as pt likes to perform like this, perform and give stretches for pectoralis, pt may like strength ABC or similar exercises for general strength and mobility    Given 04/13/21 Self manual lymph drainage: Perform this sequence once a day.  Only give enough pressure no your skin to make the skin move.  Diaphragmatic - Supine   Inhale through nose making navel move out toward hands. Exhale through puckered lips, hands follow navel in.    Copyright  VHI. All rights reserved.  Hug yourself.  Do circles at your neck just above your collarbones.  Repeat this 10 times.  Axilla - One at a Time   Using full weight of flat hand and fingers at center of uninvolved armpit, make _10__ in-place circles.   Copyright  VHI. All rights reserved.  LEG: Inguinal Nodes Stimulation   With small finger side of hand against hip crease on involved side, gently perform circles at the crease. Repeat __10_ times.   Copyright  VHI. All rights reserved.  Axilla to Inguinal Nodes - Sweep   On involved side, sweep _4__ times from armpit along side of trunk to hip crease.  Now gently stretch skin from the involved side to the uninvolved side  across the chest at the shoulder line.  Repeat that 4 times.  Draw an imaginary diagonal line from upper outer breast through the nipple area toward lower inner breast.  Direct fluid upward and inward from this line toward the pathway across your upper chest .  Do this in three rows to treat all of the upper inner breast tissue, and do each row 3-4x.      Direct fluid to treat all of lower outer breast tissue downward and outward toward      pathway that is aimed at the left groin.   Finish by doing the pathways as described above going from your involved armpit to the same side groin and going across your upper chest from the involved shoulder to the uninvolved shoulder.  Repeat the steps above where you do circles in your left groin and right armpit.   Stark Bray, PT 04/13/2021, 9:46 AM

## 2021-04-16 ENCOUNTER — Inpatient Hospital Stay: Payer: 59 | Admitting: Pharmacist

## 2021-04-16 ENCOUNTER — Other Ambulatory Visit: Payer: Self-pay

## 2021-04-16 ENCOUNTER — Inpatient Hospital Stay: Payer: 59 | Attending: Hematology and Oncology

## 2021-04-16 ENCOUNTER — Ambulatory Visit: Payer: 59

## 2021-04-16 DIAGNOSIS — Z171 Estrogen receptor negative status [ER-]: Secondary | ICD-10-CM | POA: Insufficient documentation

## 2021-04-16 DIAGNOSIS — M7989 Other specified soft tissue disorders: Secondary | ICD-10-CM | POA: Insufficient documentation

## 2021-04-16 DIAGNOSIS — Z79899 Other long term (current) drug therapy: Secondary | ICD-10-CM | POA: Insufficient documentation

## 2021-04-16 DIAGNOSIS — C50411 Malignant neoplasm of upper-outer quadrant of right female breast: Secondary | ICD-10-CM

## 2021-04-16 DIAGNOSIS — R110 Nausea: Secondary | ICD-10-CM | POA: Diagnosis not present

## 2021-04-16 LAB — CBC WITH DIFFERENTIAL (CANCER CENTER ONLY)
Abs Immature Granulocytes: 0.05 10*3/uL (ref 0.00–0.07)
Basophils Absolute: 0.1 10*3/uL (ref 0.0–0.1)
Basophils Relative: 1 %
Eosinophils Absolute: 0.2 10*3/uL (ref 0.0–0.5)
Eosinophils Relative: 2 %
HCT: 37 % (ref 36.0–46.0)
Hemoglobin: 12.1 g/dL (ref 12.0–15.0)
Immature Granulocytes: 1 %
Lymphocytes Relative: 25 %
Lymphs Abs: 2.4 10*3/uL (ref 0.7–4.0)
MCH: 26 pg (ref 26.0–34.0)
MCHC: 32.7 g/dL (ref 30.0–36.0)
MCV: 79.6 fL — ABNORMAL LOW (ref 80.0–100.0)
Monocytes Absolute: 0.4 10*3/uL (ref 0.1–1.0)
Monocytes Relative: 4 %
Neutro Abs: 6.6 10*3/uL (ref 1.7–7.7)
Neutrophils Relative %: 67 %
Platelet Count: 254 10*3/uL (ref 150–400)
RBC: 4.65 MIL/uL (ref 3.87–5.11)
RDW: 17.3 % — ABNORMAL HIGH (ref 11.5–15.5)
WBC Count: 9.8 10*3/uL (ref 4.0–10.5)
nRBC: 0.2 % (ref 0.0–0.2)

## 2021-04-16 LAB — CMP (CANCER CENTER ONLY)
ALT: 15 U/L (ref 0–44)
AST: 17 U/L (ref 15–41)
Albumin: 3.9 g/dL (ref 3.5–5.0)
Alkaline Phosphatase: 83 U/L (ref 38–126)
Anion gap: 9 (ref 5–15)
BUN: 6 mg/dL (ref 6–20)
CO2: 26 mmol/L (ref 22–32)
Calcium: 9.1 mg/dL (ref 8.9–10.3)
Chloride: 106 mmol/L (ref 98–111)
Creatinine: 0.73 mg/dL (ref 0.44–1.00)
GFR, Estimated: >60 mL/min (ref >60)
Glucose, Bld: 133 mg/dL — ABNORMAL HIGH (ref 70–99)
Potassium: 3.6 mmol/L (ref 3.5–5.1)
Sodium: 141 mmol/L (ref 135–145)
Total Bilirubin: 0.4 mg/dL (ref 0.3–1.2)
Total Protein: 7.4 g/dL (ref 6.5–8.1)

## 2021-04-16 MED ORDER — OLAPARIB 150 MG PO TABS: 300.0000 mg | ORAL_TABLET | Freq: Two times a day (BID) | ORAL | 0 refills | Status: DC

## 2021-04-16 NOTE — Progress Notes (Signed)
[place]       Telephone: [phone]?Fax: [phone]   Oncology Clinical Pharmacist Practitioner Progress Note  Jacqueline Adams was contacted via in-person visit to discuss her chemotherapy regimen for olaparib which they receive under the care of Dr. Nicholas Lose.   Current treatment regimen and start date Olaparib (03/03/21)  Interval History She continues on olaparib 150 mg by mouth  every 12 hours on days 1 to 28 of a 28-day cycle. This is being given  monotherapy. Therapy is planned to continue until  one year in the adjuvant setting per the [employer] trial data .   Response to Therapy Jacqueline Adams is doing well.  She last saw clinical pharmacy on February 7, and Dr. [name] on November 2.  She had called about some leg swelling on February 9 and was seen by our nurse practitioner Thedore Mins.  Imaging on her leg was negative and she was prescribed a short course of Augmentin for possible cellulitis.  Upon her visit today, she states her leg is doing better.  The small bump is still visible but she says it is much reduced.  It is not causing her any discomfort or pain.  We discussed that if that area should get worse to contact the clinic immediately.  She verbalized understanding.  She reports tolerating the olaparib fairly well.  She states that she has small dots that appear randomly on her left and right arm and then go away.  She states they are not painful, purulent, or causing any discomfort.  She also describes a mole on her right leg near her calf area.  We discussed that we could possibly do a dermatology consult if Dr. [name] agreed.  She next sees him on April 3, and she would prefer to have him look at her leg at that time.  We felt that was reasonable.  We discussed that if the small dots that are appearing on her arms or her mole on her right leg should get any worse, to contact the clinic immediately in the interim.  Jacqueline Adams also reports some  itchiness from time to time on her arms and back.  During our conversation, she states that she has been treating these areas with alcohol.  We recommended to stop using alcohol as it can dry the skin further.  We recommended using a topical lotion and a mild soap when washing.  She states she does not notice any rash and the itchiness is not painful or purulent.  It also does not last very long and goes away within a day or so.  Jacqueline Adams reports mild nausea which she is using some ginger chews for and different types of teas.  She feels the nausea is manageable.  She is not describing any other side effects at this time.  Her potassium was just below the lower limit of normal at her last visit.  She continues to take a potassium supplement and has incorporated some potassium rich foods into her diet.  Today her potassium is within normal limits.  Since she is on a reduced dose of olaparib at this time, we did discuss going up on the dose to 300 mg every 12 hours.  She is agreeable to this, and will start this new dose tomorrow morning.  She will use her 150 mg dose tablets for now and we will prescribe a new prescription for the 300 mg every 12 hour dose to be started once  her 150 mg tablets are exhausted.  This will [facility] to [facility]. Labs, vitals, treatment parameters, and manufacturer guidelines assessing toxicity were reviewed with Jacqueline Adams today. Based on these values, patient is in agreement to continue olaparib therapy at this time.  Allergies Allergies  Allergen Reactions   Nabumetone Swelling and Hives   Procaine Swelling   Shellfish Allergy Anaphylaxis    Vitals Vitals with BMI 04/16/2021 03/22/2021 03/20/2021  Height '5\' 4"'$  '5\' 4"'$  -  Weight 222 lbs 14 oz 219 lbs 10 oz 219 lbs 5 oz  BMI 38.24 99.35 70.17  Systolic 793 903 009  Diastolic 84 61 63  Pulse 233 107 73     Laboratory Data CBC EXTENDED Latest Ref Rng & Units 04/16/2021 03/20/2021 03/06/2021  WBC 4.0 -  10.5 K/uL 9.8 8.9 9.2  RBC 3.87 - 5.11 MIL/uL 4.65 4.73 4.54  HGB 12.0 - 15.0 g/dL 12.1 12.2 11.5(L)  HCT 36.0 - 46.0 % 37.0 37.4 35.8(L)  PLT 150 - 400 K/uL 254 243 261  NEUTROABS 1.7 - 7.7 K/uL 6.6 5.9 5.8  LYMPHSABS 0.7 - 4.0 K/uL 2.4 2.3 2.5    CMP Latest Ref Rng & Units 04/16/2021 03/20/2021 03/06/2021  Glucose 70 - 99 mg/dL 133(H) 100(H) 126(H)  BUN 6 - 20 mg/dL '6 10 7  '$ Creatinine 0.44 - 1.00 mg/dL 0.73 0.63 0.69  Sodium 135 - 145 mmol/L 141 141 143  Potassium 3.5 - 5.1 mmol/L 3.6 3.4(L) 3.5  Chloride 98 - 111 mmol/L 106 106 107  CO2 22 - 32 mmol/L '26 28 27  '$ Calcium 8.9 - 10.3 mg/dL 9.1 9.3 9.2  Total Protein 6.5 - 8.1 g/dL 7.4 7.4 7.5  Total Bilirubin 0.3 - 1.2 mg/dL 0.4 0.5 0.3  Alkaline Phos 38 - 126 U/L 83 78 93  AST 15 - 41 U/L '17 15 15  '$ ALT 0 - 44 U/L '15 14 12    '$ Lab Results  Component Value Date   MG 1.8 05/22/2020     Adverse Effects Assessment Cutaneous: as above, Jacqueline Adams will discuss with Dr. [name] and next visit.  She will contact the clinic immediately should symptoms get worse.  She will stop using rubbing alcohol on her skin and instead try to topical creams and mild soap. Nausea: well controlled Hypokalemia: resolved. Continue potassium supplement for now  Adherence Assessment Jacqueline Adams reports missing 0 doses over the past 4 weeks.   Reason for missed dose: N/A Patient was re-educated on importance of adherence.   Access Assessment Jacqueline Adams is currently receiving her olaparib through  [place] concerns:  none  Medication Reconciliation The patient's medication list was reviewed today with the patient? Yes New medications or herbal supplements have recently been started? No  Any medications have been discontinued?  Yes, Augmentin The medication list was updated and reconciled based on the patient's most recent medication list in the electronic medical record (EMR) including herbal products and OTC  medications.   Medications Current Outpatient Medications  Medication Sig Dispense Refill   AVONEX PREFILLED 30 MCG/0.5ML PSKT injection INJECT ONE SYRINGE INTRAMUSCULARLY ONCE WEEKLY. REFRIGERATE. PROTECT FROM LIGHT. ALLOW TO WARM TO ROOM TEMPERATURE PRIOR TO USE. 3 each 3   Calcium Carb-Cholecalciferol (CALCIUM 600+D3 PO) Take 1 tablet by mouth in the morning and at bedtime.     ELDERBERRY PO Take by mouth. Patient report taking 2 gummies daily     Ferrous Sulfate Dried (SLOW RELEASE  IRON) 45 MG TBCR Take 45 mg by mouth in the morning.     fexofenadine (ALLEGRA) 180 MG tablet Take 180 mg by mouth daily as needed for allergies.     fluticasone (FLONASE) 50 MCG/ACT nasal spray Place 2 sprays into both nostrils daily. (Patient taking differently: Place 2 sprays into both nostrils daily as needed for allergies.) 16 g 2   ibuprofen (ADVIL) 200 MG tablet Take 400 mg by mouth every Friday. W/Avonex injection     KLOR-CON M20 20 MEQ tablet TAKE 1 TABLET BY MOUTH EVERY DAY IN THE MORNING 30 tablet 1   meclizine (ANTIVERT) 25 MG tablet Take 1 tablet (25 mg total) by mouth 3 (three) times daily as needed for dizziness. 30 tablet 0   metoprolol succinate (TOPROL-XL) 25 MG 24 hr tablet Take 25 mg by mouth daily.     Multiple Vitamin (MULTIVITAMIN WITH MINERALS) TABS tablet Take 1 tablet by mouth every evening. Centrum     olaparib (LYNPARZA) 150 MG tablet Take 1 tablet (150 mg total) by mouth 2 (two) times daily. Swallow whole. May take with food to decrease nausea and vomiting. 60 tablet 0   Omega-3 Fatty Acids (FISH OIL) 1000 MG CPDR Take 1,000 mg by mouth daily.     simvastatin (ZOCOR) 20 MG tablet Take 20 mg by mouth every evening.     VENTOLIN HFA 108 (90 BASE) MCG/ACT inhaler Inhale 1-2 puffs into the lungs every 6 (six) hours as needed for shortness of breath or wheezing.     No current facility-administered medications for this visit.    Drug-Drug Interactions (DDIs) DDIs were evaluated?  Yes Significant DDIs? No  The patient was instructed to speak with their health care provider and/or the oral chemotherapy pharmacist before starting any new drug, including prescription or over the counter, natural / herbal products, or vitamins.  Supportive Care Fever: reviewed the importance of having a thermometer and the Centers for Disease Control and Prevention (CDC) definition of fever which is 100.73F (38C) or higher. Patient should call 24/7 triage at [phone] if experiencing a fever or any other symptoms MDS/AML N/V/D Fatigue Pneumonitis Anemia Headache Neutropenia Arthralgia  Dosing Assessment Hepatic adjustments needed? No  Renal adjustments needed? No  Toxicity adjustments needed? No  The current dosing regimen is appropriate to continue at this time. Increasing to 300 mg every 12 hours starting tomorrow. New prescription sent  Follow-Up Plan Start olaparib 300 mg every 12 hours tomorrow morning. Use 150 mg tablets for now until exhausted. [facility] Cutaneous: Jacqueline Adams will closely monitor and stop using rubbing alcohol on her skin. She will use topical lotions and let clinic know should any rashes form. She will contact clinic should symptoms worsen and is comfortable having Dr. [name] exam her mole prior to a derm consult should he feel it is necessary  Jacqueline Adams participated in the discussion, expressed understanding, and voiced agreement with the above plan. All questions were answered to her satisfaction. The patient was advised to contact the clinic at [phone] with any questions or concerns prior to her return visit.   I spent 30 minutes assessing and educating the patient.  Raina Mina, RPH-CPP, 04/16/2021  10:48 AM   **Disclaimer: This note was dictated with voice recognition software. Similar sounding words can inadvertently be transcribed and this note may contain transcription errors which may  not have been corrected upon publication of note.**

## 2021-04-16 NOTE — Therapy (Signed)
OUTPATIENT PHYSICAL THERAPY TREATMENT NOTE   Patient Name: Jacqueline Adams MRN: [id number] DOB:1960/10/06, 61 y.o., female Today's Date: 04/16/2021  PCP: Robyne Peers, MD REFERRING PROVIDER:Vinay Gudena MD   PT End of Session - 04/16/21 1602     Visit Number 8    Number of Visits 15    Date for PT Re-Evaluation 05/25/21    PT Start Time 6381    PT Stop Time 7711    PT Time Calculation (min) 50 min    Activity Tolerance Patient tolerated treatment well    Behavior During Therapy [facility] for tasks assessed/performed             Past Medical History:  Diagnosis Date   BRCA2 gene mutation positive in female    Breast cancer ([place]) 01/2020   right breast IDC   Bronchitis    Cervical spondylarthritis    Headache(784.0)    High cholesterol    Hypertension    Leukocytosis 2009   Multiple sclerosis ([place]) 2009   followed by Dr. Erling Cruz   Neuromuscular disorder [facility])    Pneumonia    Rhinitis    Seasonal allergies    Sinusitis    Past Surgical History:  Procedure Laterality Date   ABDOMINAL HYSTERECTOMY  2006   uterus, cervix, portion of fallopian tubes for AUB   BREAST LUMPECTOMY WITH RADIOACTIVE SEED AND SENTINEL LYMPH NODE BIOPSY Right 08/08/2020   Procedure: RIGHT BREAST LUMPECTOMY WITH RADIOACTIVE SEED X 3 AND SENTINEL LYMPH NODE BIOPSY;  Surgeon: Stark Klein, MD;  Location: [place];  Service: General;  Laterality: Right;   CESAREAN SECTION     x 3   CORRECTION HAMMER TOE Bilateral    PORT-A-CATH REMOVAL Right 09/21/2020   Procedure: REMOVAL PORT-A-CATH;  Surgeon: Stark Klein, MD;  Location: [place];  Service: General;  Laterality: Right;   PORTACATH PLACEMENT Right 02/22/2020   Procedure: RIGHT SUBCLAVIAN VEIN PORT PLACEMENT;  Surgeon: Donnie Mesa, MD;  Location: [place];  Service: General;  Laterality: Right;   ROBOTIC ASSISTED BILATERAL SALPINGO OOPHERECTOMY Bilateral 01/30/2021   Procedure: XI ROBOTIC ASSISTED BILATERAL SALPINGO  OOPHORECTOMY, LYSIS OF ADHESIONS, 1 HOUR;  Surgeon: Lafonda Mosses, MD;  Location[facility];  Service: Gynecology;  Laterality: Bilateral;   Patient Active Problem List   Diagnosis Date Noted   Bilateral ovarian cysts    Port-A-Cath in place 04/05/2020   Malignant neoplasm of upper-outer quadrant of right breast in female, estrogen receptor negative ([place]) 01/10/2020   High risk medication use 03/25/2019   Microcytic anemia 12/31/2017   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 UACS 12/16/2017   History of pneumonia 11/12/2017   Morbid obesity ([place]) 08/19/2016   Numbness 05/09/2015   Other fatigue 05/09/2015   Arthralgia of hip 11/16/2014   Low back pain with sciatica 11/16/2014   Urinary frequency 11/16/2014   Encounter for general adult medical examination without abnormal findings 06/07/2014   DOE (dyspnea on exertion) 04/27/2014   Atypical chest pain 04/27/2014   Dyslipidemia 04/27/2014   HLD (hyperlipidemia) 02/14/2014   Cervical spondylosis without myelopathy 07/28/2013   Cervical pain 07/28/2013   CFIDS (chronic fatigue and immune dysfunction syndrome) ([place]) 07/28/2013   Chronic infection of sinus 07/28/2013   Headache, migraine 07/28/2013   DS (disseminated sclerosis) ([place]) 07/28/2013   Spasms of the hands or feet 07/28/2013   DDD (degenerative disc disease), cervical 07/28/2013   Menopausal symptom 07/28/2013   Taking drug for chronic disease 07/28/2013   Vitamin D deficiency 07/28/2013  Degeneration of intervertebral disc of cervical region 07/28/2013   Cough 04/06/2013   BRCA gene mutation positive in female 06/12/2011   Genetic susceptibility to malignant neoplasm of breast 06/12/2011   Multiple sclerosis ([place])    Leukocytosis     REFERRING DIAG: Right breast Cancer  THERAPY DIAG:  Malignant neoplasm of upper-outer quadrant of right breast in female, estrogen receptor negative ([place])  PERTINENT HISTORY: Patient was diagnosed on 12/23/2019 with 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 She underwent neoadjuvant chemotherapy 02/23/2020 - 07/05/2020 followed by a right lumpectomy and sentinel node biopsy (3 negative nodes) on 08/08/2020. Completed radiation.  She has relapsing remitting multiple sclerosis since 2009  PRECAUTIONS: Right UE lymphedema risk  SUBJECTIVEThe chip pack feels great and the aching is much better also.  It softened it up a lot. I tried the lymphatic drainage and I think it is going well.  PAIN:  Are you having pain? No  COGNITION:            Overall cognitive status: Within functional limits for tasks assessed    PALPATION: Hard but seemingly empty golf ball sized seroma Rt lateral breast.  Fibrosis medial inferior edge.  Overall breast is not very swollen.    OBSERVATIONS / OTHER ASSESSMENTS: appearance of tight lumpectomy scar tissue, enlarged pores, darkened skin post radiation   SENSATION:            Light touch: Deficits in the Rt axilla             POSTURE: forward shoulders, rounded shoulders   UPPER EXTREMITY AROM/PROM:     A/PROM 09/07/20 RIGHT  04/13/2021    Shoulder extension 46 55  Shoulder flexion 123 162  Shoulder abduction 140 165  Shoulder internal rotation 50    Shoulder external rotation 70 85                          (Blank rows = not tested)   Left:    Flex:    162            Abd:     165            ER       85     UPPER EXTREMITY STRENGTH:  Shoulder          Rt                     Lt Flex                 5                      5           Abd                  5                      5 ER                   5                      5 IR                     5  5       L-DEX LYMPHEDEMA SCREENING Measurement Type: Unilateral L-DEX MEASUREMENT EXTREMITY: Upper Extremity POSITION : Standing DOMINANT SIDE: Right At Risk Side: Right BASELINE SCORE (UNILATERAL): -0.3 L-DEX SCORE (UNILATERAL): 0.1 VALUE CHANGE (UNILAT): 0.4    Right chest measured  under armpits  117 cm   TODAY'S TREATMENT 04/16/2021 Reviewed self MLD to the left breast with PT performing on table with head of table elevated and then pt demonstrating all steps using handout provided last visit. Pt required occasional VC's for proper pressure and direction but overall did very well.  Pt also gave permission for demographics to be sent for flexitouch and will be sent today. Scar tissue mobilization also performed by PT to loosen adhered tissue   04/13/21 Performed LDEX which remains WNL and scheduled out for 3 months In supine: reviewed self MLD for the Rt breast with new handout given with notes made as needed.  PT performed each step and then pt performed with hand over hand tcs and vcs.  Pt has been educated on this before, so this was overall a reminder but cueing still needed for each step.   Gave pt small chip foam seroma pack to wear in compression bra   PATIENT EDUCATION:  Education details: see treatment note Person educated: Patient Education method: Explanation, Demonstration, Tactile cues, Verbal cues, and Handouts Education comprehension: verbalized understanding, returned demonstration, verbal cues required, tactile cues required, and needs further education     HOME EXERCISE PROGRAM: Self MLD - see bottom of note   ASSESSMENT:   CLINICAL IMPRESSION: Pt is getting great benefit from the chip pack with the area of fibrosis now less tender and much softer. Scar tissue at lateral breast still adhered. Pt did very well with her self manual lymph drainage with occasional cues for direction and pressure. Pt may benefit from peach foam at the inferior breast for mild fibrosis. Pt did find a small tender nodular area in left axillary region that she will have checked..   OBJECTIVE IMPAIRMENTS increased fascial restrictions and impaired sensation.    ACTIVITY LIMITATIONS  none .    PERSONAL FACTORS Fitness and 1-2 comorbidities: SLNB, radiation  are also affecting  patient's functional outcome.      REHAB POTENTIAL: Excellent   CLINICAL DECISION MAKING: Stable/uncomplicated   EVALUATION COMPLEXITY: Low   GOALS: Goals reviewed with patient? Yes   LONG TERM GOALS:   Pt will be ind with self MLD for the Rt breast Baseline: needs reeducation Target date: 05/25/2021 Goal status: INITIAL   2.  Pt will be ind with home stretches post radiation to decrease stiffness Baseline: no HEP Target date: 05/25/21    Goal status: INITIAL   PLAN: PT FREQUENCY: 2x/week   PT DURATION: 6 weeks   PLANNED INTERVENTIONS: Therapeutic exercises, Patient/Family education, Joint mobilization, Manual lymph drainage, and Manual therapy   PLAN FOR NEXT SESSION: review self MLD in seated as pt likes to perform like this, perform and give stretches for pectoralis, pt may like strength ABC or similar exercises for general strength and mobility         Claris Pong, PT 04/16/2021, 5:10 PM

## 2021-04-17 ENCOUNTER — Telehealth: Payer: Self-pay | Admitting: Pharmacist

## 2021-04-17 NOTE — Telephone Encounter (Signed)
Scheduled appointment per 3/6 los. Patient is aware of upcoming appointment. ?

## 2021-04-18 ENCOUNTER — Ambulatory Visit: Payer: 59

## 2021-04-18 ENCOUNTER — Other Ambulatory Visit: Payer: Self-pay

## 2021-04-18 DIAGNOSIS — M25611 Stiffness of right shoulder, not elsewhere classified: Secondary | ICD-10-CM

## 2021-04-18 DIAGNOSIS — R293 Abnormal posture: Secondary | ICD-10-CM

## 2021-04-18 DIAGNOSIS — Z483 Aftercare following surgery for neoplasm: Secondary | ICD-10-CM

## 2021-04-18 DIAGNOSIS — C50411 Malignant neoplasm of upper-outer quadrant of right female breast: Secondary | ICD-10-CM | POA: Diagnosis not present

## 2021-04-18 NOTE — Therapy (Signed)
OUTPATIENT PHYSICAL THERAPY TREATMENT NOTE   Patient Name: Jacqueline Adams MRN: [id number] DOB:26-Jul-1960, 61 y.o., female Today's Date: 04/18/2021  PCP: Robyne Peers, MD REFERRING PROVIDER:Vinay Gudena MD   PT End of Session - 04/18/21 1306     Visit Number 9    Number of Visits 15    Date for PT Re-Evaluation 05/25/21    Authorization - Number of Visits 30    PT Start Time 6812    PT Stop Time 1350    PT Time Calculation (min) 44 min    Activity Tolerance Patient tolerated treatment well    Behavior During Therapy [facility] for tasks assessed/performed             Past Medical History:  Diagnosis Date   BRCA2 gene mutation positive in female    Breast cancer ([place]) 01/2020   right breast IDC   Bronchitis    Cervical spondylarthritis    Headache(784.0)    High cholesterol    Hypertension    Leukocytosis 2009   Multiple sclerosis ([place]) 2009   followed by Dr. Erling Cruz   Neuromuscular disorder [facility])    Pneumonia    Rhinitis    Seasonal allergies    Sinusitis    Past Surgical History:  Procedure Laterality Date   ABDOMINAL HYSTERECTOMY  2006   uterus, cervix, portion of fallopian tubes for AUB   BREAST LUMPECTOMY WITH RADIOACTIVE SEED AND SENTINEL LYMPH NODE BIOPSY Right 08/08/2020   Procedure: RIGHT BREAST LUMPECTOMY WITH RADIOACTIVE SEED X 3 AND SENTINEL LYMPH NODE BIOPSY;  Surgeon: Stark Klein, MD;  Location: [place];  Service: General;  Laterality: Right;   CESAREAN SECTION     x 3   CORRECTION HAMMER TOE Bilateral    PORT-A-CATH REMOVAL Right 09/21/2020   Procedure: REMOVAL PORT-A-CATH;  Surgeon: Stark Klein, MD;  Location: [place];  Service: General;  Laterality: Right;   PORTACATH PLACEMENT Right 02/22/2020   Procedure: RIGHT SUBCLAVIAN VEIN PORT PLACEMENT;  Surgeon: Donnie Mesa, MD;  Location: [place];  Service: General;  Laterality: Right;   ROBOTIC ASSISTED BILATERAL SALPINGO OOPHERECTOMY Bilateral 01/30/2021   Procedure: XI  ROBOTIC ASSISTED BILATERAL SALPINGO OOPHORECTOMY, LYSIS OF ADHESIONS, 1 HOUR;  Surgeon: Lafonda Mosses, MD;  [facility];  Service: Gynecology;  Laterality: Bilateral;   Patient Active Problem List   Diagnosis Date Noted   Bilateral ovarian cysts    Port-A-Cath in place 04/05/2020   Malignant neoplasm of upper-outer quadrant of right breast in female, estrogen receptor negative ([place]) 01/10/2020   High risk medication use 03/25/2019   Microcytic anemia 12/31/2017   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 UACS 12/16/2017   History of pneumonia 11/12/2017   Morbid obesity ([place]) 08/19/2016   Numbness 05/09/2015   Other fatigue 05/09/2015   Arthralgia of hip 11/16/2014   Low back pain with sciatica 11/16/2014   Urinary frequency 11/16/2014   Encounter for general adult medical examination without abnormal findings 06/07/2014   DOE (dyspnea on exertion) 04/27/2014   Atypical chest pain 04/27/2014   Dyslipidemia 04/27/2014   HLD (hyperlipidemia) 02/14/2014   Cervical spondylosis without myelopathy 07/28/2013   Cervical pain 07/28/2013   CFIDS (chronic fatigue and immune dysfunction syndrome) ([place]) 07/28/2013   Chronic infection of sinus 07/28/2013   Headache, migraine 07/28/2013   DS (disseminated sclerosis) ([place]) 07/28/2013   Spasms of the hands or feet 07/28/2013   DDD (degenerative disc disease), cervical 07/28/2013   Menopausal symptom 07/28/2013   Taking drug for chronic  disease 07/28/2013   Vitamin D deficiency 07/28/2013   Degeneration of intervertebral disc of cervical region 07/28/2013   Cough 04/06/2013   BRCA gene mutation positive in female 06/12/2011   Genetic susceptibility to malignant neoplasm of breast 06/12/2011   Multiple sclerosis ([place])    Leukocytosis     REFERRING DIAG: Right breast Cancer  THERAPY DIAG:  Malignant neoplasm of upper-outer quadrant of right breast in female, estrogen receptor negative ([place])  Aftercare following surgery for neoplasm  Abnormal  posture  Stiffness of right shoulder, not elsewhere classified  PERTINENT HISTORY: Patient was diagnosed on 12/23/2019 with 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 She underwent neoadjuvant chemotherapy 02/23/2020 - 07/05/2020 followed by a right lumpectomy and sentinel node biopsy (3 negative nodes) on 08/08/2020. Completed radiation.  She has relapsing remitting multiple sclerosis since 2009  PRECAUTIONS: Right UE lymphedema risk  SUBJECTIVE:Saw the doctor yesterday and he gave me some antibiotics for my ears and sinus. Feel a little better today. The breast is doing better.  It isn't as tender. The chip foam helps a lot too. Pain is 20-30% better PAIN:  Are you having pain? No  COGNITION:            Overall cognitive status: Within functional limits for tasks assessed    PALPATION: Hard but seemingly empty golf ball sized seroma Rt lateral breast.  Fibrosis medial inferior edge.  Overall breast is not very swollen.    OBSERVATIONS / OTHER ASSESSMENTS: appearance of tight lumpectomy scar tissue, enlarged pores, darkened skin post radiation   SENSATION:            Light touch: Deficits in the Rt axilla             POSTURE: forward shoulders, rounded shoulders   UPPER EXTREMITY AROM/PROM:     A/PROM 09/07/20 RIGHT  04/13/2021    Shoulder extension 46 55  Shoulder flexion 123 162  Shoulder abduction 140 165  Shoulder internal rotation 50    Shoulder external rotation 70 85                          (Blank rows = not tested)   Left:    Flex:    162            Abd:     165            ER       85     UPPER EXTREMITY STRENGTH:  Shoulder          Rt                     Lt Flex                 5                      5           Abd                  5                      5 ER                   5  5 IR                     5                      5       L-DEX LYMPHEDEMA SCREENING Measurement Type: Unilateral L-DEX MEASUREMENT  EXTREMITY: Upper Extremity POSITION : Standing DOMINANT SIDE: Right At Risk Side: Right BASELINE SCORE (UNILATERAL): -0.3 L-DEX SCORE (UNILATERAL): 0.1 VALUE CHANGE (UNILAT): 0.4    Right chest measured under armpits  117 cm   TODAY'S TREATMENT  04/18/2021  Reviewed self MLD to the right breast with PT performing on table with head of table elevated and then pt demonstrating all steps. Peach foam strip made and placed in stockinette to place under breast in areas of fibrosis in compression bra with chip pack over breast incision  04/16/2021 Reviewed self MLD to the right breast with PT performing on table with head of table elevated and then pt demonstrating all steps using handout provided last visit. Pt required occasional VC's for proper pressure and direction but overall did very well.  Pt also gave permission for demographics to be sent for flexitouch and will be sent today. Scar tissue mobilization also performed by PT to loosen adhered tissue   04/13/21 Performed LDEX which remains WNL and scheduled out for 3 months In supine: reviewed self MLD for the Rt breast with new handout given with notes made as needed.  PT performed each step and then pt performed with hand over hand tcs and vcs.  Pt has been educated on this before, so this was overall a reminder but cueing still needed for each step.   Gave pt small chip foam seroma pack to wear in compression bra   PATIENT EDUCATION:  Education details: see treatment note Person educated: Patient Education method: Explanation, Demonstration, Tactile cues, Verbal cues, and Handouts Education comprehension: verbalized understanding, returned demonstration, verbal cues required, tactile cues required, and needs further education     HOME EXERCISE PROGRAM: Self MLD - see bottom of note   ASSESSMENT:   CLINICAL IMPRESSION: Pt is getting excellent benefit from chip pack with that area much softer overall. Reviewed self right breast MLD  again and pt did very well with only occasional VC's and Pt has a very good understanding of the sequence.  Added peach foam to inferior breast area of fibrosis   OBJECTIVE IMPAIRMENTS increased fascial restrictions and impaired sensation.    ACTIVITY LIMITATIONS  none .    PERSONAL FACTORS Fitness and 1-2 comorbidities: SLNB, radiation  are also affecting patient's functional outcome.      REHAB POTENTIAL: Excellent   CLINICAL DECISION MAKING: Stable/uncomplicated   EVALUATION COMPLEXITY: Low   GOALS: Goals reviewed with patient? Yes   LONG TERM GOALS:   Pt will be ind with self MLD for the Rt breast Baseline: needs reeducation Target date: 05/25/2021 Goal status: INITIAL   2.  Pt will be ind with home stretches post radiation to decrease stiffness Baseline: no HEP Target date: 05/25/21    Goal status: INITIAL   PLAN: PT FREQUENCY: 2x/week   PT DURATION: 6 weeks   PLANNED INTERVENTIONS: Therapeutic exercises, Patient/Family education, Joint mobilization, Manual lymph drainage, and Manual therapy   PLAN FOR NEXT SESSION: review self MLD in seated as pt likes to perform like this, perform and give stretches for pectoralis, pt may like strength ABC or similar exercises for general strength and mobility  Claris Pong, PT 04/18/2021, 1:52 PM

## 2021-04-24 ENCOUNTER — Other Ambulatory Visit: Payer: Self-pay

## 2021-04-24 ENCOUNTER — Ambulatory Visit: Payer: 59 | Admitting: Rehabilitation

## 2021-04-24 ENCOUNTER — Encounter: Payer: Self-pay | Admitting: Rehabilitation

## 2021-04-24 DIAGNOSIS — Z483 Aftercare following surgery for neoplasm: Secondary | ICD-10-CM

## 2021-04-24 DIAGNOSIS — C50411 Malignant neoplasm of upper-outer quadrant of right female breast: Secondary | ICD-10-CM

## 2021-04-24 DIAGNOSIS — M25611 Stiffness of right shoulder, not elsewhere classified: Secondary | ICD-10-CM

## 2021-04-24 DIAGNOSIS — R293 Abnormal posture: Secondary | ICD-10-CM

## 2021-04-24 NOTE — Therapy (Signed)
?OUTPATIENT PHYSICAL THERAPY TREATMENT NOTE ? ? ?Patient Name: Jacqueline Adams ?MRN: [id number] ?DOB:11-03-1960, 61 y.o., female ?Today's Date: 04/24/2021 ? ?PCP: Robyne Peers, MD ?REFERRING PROVIDER:Vinay [name] MD ? ? PT End of Session - 04/24/21 1556   ? ? Visit Number 10   ? Number of Visits 15   ? Date for PT Re-Evaluation 05/25/21   ? PT Start Time 1410   ? PT Stop Time 1455   ? PT Time Calculation (min) 45 min   ? Activity Tolerance Patient tolerated treatment well   ? Behavior During Therapy [facility] [place] for tasks assessed/performed   ? ?  ?  ? ?  ? ? ?Past Medical History:  ?Diagnosis Date  ? BRCA2 gene mutation positive in female   ? Breast cancer ([place]) 01/2020  ? right breast IDC  ? Bronchitis   ? Cervical spondylarthritis   ? Headache(784.0)   ? High cholesterol   ? Hypertension   ? Leukocytosis 2009  ? Multiple sclerosis ([place]) 2009  ? followed by Dr. Erling Cruz  ? Neuromuscular disorder ([place])   ? Pneumonia   ? Rhinitis   ? Seasonal allergies   ? Sinusitis   ? ?Past Surgical History:  ?Procedure Laterality Date  ? ABDOMINAL HYSTERECTOMY  2006  ? uterus, cervix, portion of fallopian tubes for AUB  ? BREAST LUMPECTOMY WITH RADIOACTIVE SEED AND SENTINEL LYMPH NODE BIOPSY Right 08/08/2020  ? Procedure: RIGHT BREAST LUMPECTOMY WITH RADIOACTIVE SEED X 3 AND SENTINEL LYMPH NODE BIOPSY;  Surgeon: Stark Klein, MD;  Location: [place];  Service: General;  Laterality: Right;  ? CESAREAN SECTION    ? x 3  ? CORRECTION HAMMER TOE Bilateral   ? PORT-A-CATH REMOVAL Right 09/21/2020  ? Procedure: REMOVAL PORT-A-CATH;  Surgeon: Stark Klein, MD;  Location: [place];  Service: General;  Laterality: Right;  ? PORTACATH PLACEMENT Right 02/22/2020  ? Procedure: RIGHT SUBCLAVIAN VEIN PORT PLACEMENT;  Surgeon: Donnie Mesa, MD;  Location: [place];  Service: General;  Laterality: Right;  ? ROBOTIC ASSISTED BILATERAL SALPINGO OOPHERECTOMY Bilateral 01/30/2021  ? Procedure: XI ROBOTIC ASSISTED BILATERAL SALPINGO  OOPHORECTOMY, LYSIS OF ADHESIONS, 1 HOUR;  Surgeon: Lafonda Mosses, MD;  [facility];  Service: Gynecology;  Laterality: Bilateral;  ? ?Patient Active Problem List  ? Diagnosis Date Noted  ? Bilateral ovarian cysts   ? Port-A-Cath in place 04/05/2020  ? Malignant neoplasm of upper-outer quadrant of right breast in female, estrogen receptor negative ([place]) 01/10/2020  ? High risk medication use 03/25/2019  ? Microcytic anemia 12/31/2017  ? 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 UACS 12/16/2017  ? History of pneumonia 11/12/2017  ? Morbid obesity ([place]) 08/19/2016  ? Numbness 05/09/2015  ? Other fatigue 05/09/2015  ? Arthralgia of hip 11/16/2014  ? Low back pain with sciatica 11/16/2014  ? Urinary frequency 11/16/2014  ? Encounter for general adult medical examination without abnormal findings 06/07/2014  ? DOE (dyspnea on exertion) 04/27/2014  ? Atypical chest pain 04/27/2014  ? Dyslipidemia 04/27/2014  ? HLD (hyperlipidemia) 02/14/2014  ? Cervical spondylosis without myelopathy 07/28/2013  ? Cervical pain 07/28/2013  ? CFIDS (chronic fatigue and immune dysfunction syndrome) ([place]) 07/28/2013  ? Chronic infection of sinus 07/28/2013  ? Headache, migraine 07/28/2013  ? 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 Spasms of the hands or feet 07/28/2013  ? DDD (degenerative disc disease), cervical 07/28/2013  ? Menopausal symptom 07/28/2013  ? Taking drug for chronic disease 07/28/2013  ? Vitamin D deficiency 07/28/2013  ?  Degeneration of intervertebral disc of cervical region 07/28/2013  ? Cough 04/06/2013  ? BRCA gene mutation positive in female 06/12/2011  ? Genetic susceptibility to malignant neoplasm of breast 06/12/2011  ? Multiple sclerosis ([place])   ? Leukocytosis   ? ? ?REFERRING DIAG: Right breast Cancer ? ?THERAPY DIAG:  ?Malignant neoplasm of upper-outer quadrant of right breast in female, estrogen receptor negative ([place]) ? ?Aftercare following surgery for neoplasm ? ?Abnormal posture ? ?Stiffness of right  shoulder, not elsewhere classified ? 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 She underwent neoadjuvant chemotherapy 02/23/2020 - 07/05/2020 followed by a right lumpectomy and sentinel node biopsy (3 negative nodes) on 08/08/2020. Completed radiation.  She has relapsing remitting multiple sclerosis since 2009 ? ?PRECAUTIONS: Right UE lymphedema risk ? ?SUBJECTIVE: There is some work to still be done  ? ?PAIN: Are you having pain? No ? ?  ? TODAY'S TREATMENT  ?04/24/21: ?PT performed MLD to the Rt breast with pt permission: In supine: Short neck, 5 diaphragmatic breaths, bil axillary nodes and establishment of interaxillary pathway, R inguinal nodes and establishment of axilloinguinal pathway, then R breast moving fluid towards pathways spending extra time in any areas of fibrosis then retracing all steps. ?Then in left sidelying posterior interaxillary work.  ?STM to lateral breast with scar mobilization and fibrosis deeper pressure using cocoa butter ?PROM with MFR to the axilla ? ?04/18/2021 ?Reviewed self MLD to the right breast with PT performing on table with head of table elevated and then pt demonstrating all steps. Peach foam strip made and placed in stockinette to place under breast in areas of fibrosis in compression bra with chip pack over breast incision ? ?04/16/2021 ?Reviewed self MLD to the right breast with PT performing on table with head of table elevated and then pt demonstrating all steps using handout provided last visit. Pt required occasional VC's for proper pressure and direction but overall did very well.  Pt also gave permission for demographics to be sent for flexitouch and will be sent today. Scar tissue mobilization also performed by PT to loosen adhered tissue ? ? ?PATIENT EDUCATION:  ?Education details: see treatment note ?Person educated: Patient ?Education method: Explanation, Demonstration,  Tactile cues, Verbal cues, and Handouts ?Education comprehension: verbalized understanding, returned demonstration, verbal cues required, tactile cues required, and needs further education ?  ?  ?HOME EXERCISE PROGRAM: ?Self MLD - see bottom of note ?  ?ASSESSMENT: ?  ?CLINICAL IMPRESSION: ?Significant softening noted with MT today.  Pt still has Rt breast edema, axillary tightness, and upper quadrant stiffness.   ?  ?OBJECTIVE IMPAIRMENTS increased fascial restrictions and impaired sensation.  ?  ?ACTIVITY LIMITATIONS  none .  ?  ?PERSONAL FACTORS Fitness and 1-2 comorbidities: SLNB, radiation  are also affecting patient's functional outcome.  ?  ?  ?REHAB POTENTIAL: Excellent ?  ?CLINICAL DECISION MAKING: Stable/uncomplicated ?  ?EVALUATION COMPLEXITY: Low ?  ?GOALS: ?Goals reviewed with patient? Yes ?  ?LONG TERM GOALS: ?  ?Pt will be ind with self MLD for the Rt breast ?Baseline: needs reeducation ?Target date: 05/25/2021 ?Goal status: INITIAL ?  ?2.  Pt will be ind with home stretches post radiation to decrease stiffness ?Baseline: no HEP ?Target date: 05/25/21    ?Goal status: INITIAL ?  ?PLAN: ?PT FREQUENCY: 2x/week ?  ?PT DURATION: 6 weeks ?  ?PLANNED INTERVENTIONS: Therapeutic exercises, Patient/Family education, Joint mobilization, Manual lymph drainage, and Manual therapy ?  ?  PLAN FOR NEXT SESSION: review self MLD in seated as pt likes to perform like this, perform and give stretches for pectoralis, pt may like strength ABC or similar exercises for general strength and mobility  ?  ?  ? ? ? ?Stark Bray, PT ?04/24/2021, 3:57 PM ? ?  ? ?

## 2021-04-27 ENCOUNTER — Other Ambulatory Visit: Payer: Self-pay

## 2021-04-27 ENCOUNTER — Encounter: Payer: Self-pay | Admitting: Rehabilitation

## 2021-04-27 ENCOUNTER — Ambulatory Visit: Payer: 59 | Admitting: Rehabilitation

## 2021-04-27 DIAGNOSIS — R293 Abnormal posture: Secondary | ICD-10-CM

## 2021-04-27 DIAGNOSIS — C50411 Malignant neoplasm of upper-outer quadrant of right female breast: Secondary | ICD-10-CM | POA: Diagnosis not present

## 2021-04-27 DIAGNOSIS — Z483 Aftercare following surgery for neoplasm: Secondary | ICD-10-CM

## 2021-04-27 NOTE — Therapy (Signed)
?OUTPATIENT PHYSICAL THERAPY TREATMENT NOTE ? ? ?Patient Name: Jacqueline Adams ?MRN: [id number] ?DOB:09/02/1960, 61 y.o., female ?Today's Date: 04/27/2021 ? ?PCP: Robyne Peers, MD ?REFERRING PROVIDER:Vinay [name] MD ? ? PT End of Session - 04/27/21 1002   ? ? Visit Number 11   ? Number of Visits 15   ? Date for PT Re-Evaluation 05/25/21   ? Authorization - Number of Visits 30   ? PT Start Time 1005   ? PT Stop Time 5681   ? PT Time Calculation (min) 44 min   ? Activity Tolerance Patient tolerated treatment well   ? Behavior During Therapy [facility] for tasks assessed/performed   ? ?  ?  ? ?  ? ? ?Past Medical History:  ?Diagnosis Date  ? BRCA2 gene mutation positive in female   ? Breast cancer ([place]) 01/2020  ? right breast IDC  ? Bronchitis   ? Cervical spondylarthritis   ? Headache(784.0)   ? High cholesterol   ? Hypertension   ? Leukocytosis 2009  ? Multiple sclerosis ([place]) 2009  ? followed by Dr. Erling Cruz  ? Neuromuscular disorder ([place])   ? Pneumonia   ? Rhinitis   ? Seasonal allergies   ? Sinusitis   ? ?Past Surgical History:  ?Procedure Laterality Date  ? ABDOMINAL HYSTERECTOMY  2006  ? uterus, cervix, portion of fallopian tubes for AUB  ? BREAST LUMPECTOMY WITH RADIOACTIVE SEED AND SENTINEL LYMPH NODE BIOPSY Right 08/08/2020  ? Procedure: RIGHT BREAST LUMPECTOMY WITH RADIOACTIVE SEED X 3 AND SENTINEL LYMPH NODE BIOPSY;  Surgeon: Stark Klein, MD;  Location: [place];  Service: General;  Laterality: Right;  ? CESAREAN SECTION    ? x 3  ? CORRECTION HAMMER TOE Bilateral   ? PORT-A-CATH REMOVAL Right 09/21/2020  ? Procedure: REMOVAL PORT-A-CATH;  Surgeon: Stark Klein, MD;  Location: [place];  Service: General;  Laterality: Right;  ? PORTACATH PLACEMENT Right 02/22/2020  ? Procedure: RIGHT SUBCLAVIAN VEIN PORT PLACEMENT;  Surgeon: Donnie Mesa, MD;  Location: [place];  Service: General;  Laterality: Right;  ? ROBOTIC ASSISTED BILATERAL SALPINGO OOPHERECTOMY Bilateral 01/30/2021  ? Procedure: XI  ROBOTIC ASSISTED BILATERAL SALPINGO OOPHORECTOMY, LYSIS OF ADHESIONS, 1 HOUR;  Surgeon: Lafonda Mosses, MD;  [facility];  Service: Gynecology;  Laterality: Bilateral;  ? ?Patient Active Problem List  ? Diagnosis Date Noted  ? Bilateral ovarian cysts   ? Port-A-Cath in place 04/05/2020  ? Malignant neoplasm of upper-outer quadrant of right breast in female, estrogen receptor negative ([place]) 01/10/2020  ? High risk medication use 03/25/2019  ? Microcytic anemia 12/31/2017  ? 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 UACS 12/16/2017  ? History of pneumonia 11/12/2017  ? Morbid obesity ([place]) 08/19/2016  ? Numbness 05/09/2015  ? Other fatigue 05/09/2015  ? Arthralgia of hip 11/16/2014  ? Low back pain with sciatica 11/16/2014  ? Urinary frequency 11/16/2014  ? Encounter for general adult medical examination without abnormal findings 06/07/2014  ? DOE (dyspnea on exertion) 04/27/2014  ? Atypical chest pain 04/27/2014  ? Dyslipidemia 04/27/2014  ? HLD (hyperlipidemia) 02/14/2014  ? Cervical spondylosis without myelopathy 07/28/2013  ? Cervical pain 07/28/2013  ? CFIDS (chronic fatigue and immune dysfunction syndrome) ([place]) 07/28/2013  ? Chronic infection of sinus 07/28/2013  ? Headache, migraine 07/28/2013  ? DS (disseminated sclerosis) ([place]) 07/28/2013  ? Spasms of the hands or feet 07/28/2013  ? DDD (degenerative disc disease), cervical 07/28/2013  ? Menopausal symptom 07/28/2013  ? Taking drug for chronic  disease 07/28/2013  ? Vitamin D deficiency 07/28/2013  ? Degeneration of intervertebral disc of cervical region 07/28/2013  ? Cough 04/06/2013  ? BRCA gene mutation positive in female 06/12/2011  ? Genetic susceptibility to malignant neoplasm of breast 06/12/2011  ? Multiple sclerosis ([place])   ? Leukocytosis   ? ? ?REFERRING DIAG: Right breast Cancer ? ?THERAPY DIAG:  ?Malignant neoplasm of upper-outer quadrant of right breast in female, estrogen receptor negative ([place]) ? ?Aftercare following surgery for neoplasm ? ?Abnormal  posture ? 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 She underwent neoadjuvant chemotherapy 02/23/2020 - 07/05/2020 followed by a right lumpectomy and sentinel node biopsy (3 negative nodes) on 08/08/2020. Completed radiation.  She has relapsing remitting multiple sclerosis since 2009 ? ?PRECAUTIONS: Right UE lymphedema risk ? ?SUBJECTIVE: It continues to feel good after treatment ? ?PAIN: Are you having pain? No ? ?  ? TODAY'S TREATMENT  ?04/27/21 ?PT performed MLD to the Rt breast with pt permission: In supine: Short neck, 5 diaphragmatic breaths, bil axillary nodes and establishment of interaxillary pathway, R inguinal nodes and establishment of axilloinguinal pathway, then R breast moving fluid towards pathways spending extra time in any areas of fibrosis then retracing all steps. ?Then in left sidelying posterior interaxillary work.  ?STM to lateral breast with scar mobilization and fibrosis deeper pressure using cocoa butter ?PROM with MFR to the axilla ? ?04/24/21: ?PT performed MLD to the Rt breast with pt permission: In supine: Short neck, 5 diaphragmatic breaths, bil axillary nodes and establishment of interaxillary pathway, R inguinal nodes and establishment of axilloinguinal pathway, then R breast moving fluid towards pathways spending extra time in any areas of fibrosis then retracing all steps. ?Then in left sidelying posterior interaxillary work.  ?STM to lateral breast with scar mobilization and fibrosis deeper pressure using cocoa butter ?PROM with MFR to the axilla ? ?04/18/2021 ?Reviewed self MLD to the right breast with PT performing on table with head of table elevated and then pt demonstrating all steps. Peach foam strip made and placed in stockinette to place under breast in areas of fibrosis in compression bra with chip pack over breast incision ? ? ?PATIENT EDUCATION:  ?Education details: see treatment  note ?Person educated: Patient ?Education method: Explanation, Demonstration, Tactile cues, Verbal cues, and Handouts ?Education comprehension: verbalized understanding, returned demonstration, verbal cues required, tactile cues required, and needs further education ?  ? HOME EXERCISE PROGRAM: ?Self MLD - see bottom of note ?  ?ASSESSMENT: ?  ?CLINICAL IMPRESSION: ?Significant softening noted with MT today.  Pt still has Rt breast edema, axillary tightness, and upper quadrant stiffness.  Pitting noted more lateral breast today.  ?  ?OBJECTIVE IMPAIRMENTS increased fascial restrictions and impaired sensation.  ?  ?ACTIVITY LIMITATIONS  none .  ?  ?PERSONAL FACTORS Fitness and 1-2 comorbidities: SLNB, radiation  are also affecting patient's functional outcome.  ?  ?  ?REHAB POTENTIAL: Excellent ?  ?CLINICAL DECISION MAKING: Stable/uncomplicated ?  ?EVALUATION COMPLEXITY: Low ?  ?GOALS: ?Goals reviewed with patient? Yes ?  ?LONG TERM GOALS: ?  ?Pt will be ind with self MLD for the Rt breast ?Baseline: needs reeducation ?Target date: 05/25/2021 ?Goal status: INITIAL ?  ?2.  Pt will be ind with home stretches post radiation to decrease stiffness ?Baseline: no HEP ?Target date: 05/25/21    ?Goal status: INITIAL ?  ?PLAN: ?PT FREQUENCY: 2x/week ?  ?PT DURATION: 6 weeks ?  ?PLANNED  INTERVENTIONS: Therapeutic exercises, Patient/Family education, Joint mobilization, Manual lymph drainage, and Manual therapy ?  ?PLAN FOR NEXT SESSION: review self MLD in seated as pt likes to perform like this, perform and give stretches for pectoralis, pt may like strength ABC or similar exercises for general strength and mobility  ?  ?  ? ? ? ?Stark Bray, PT ?04/27/2021, 10:50 AM ? ?  ? ?

## 2021-04-30 ENCOUNTER — Ambulatory Visit: Payer: 59 | Admitting: Rehabilitation

## 2021-04-30 ENCOUNTER — Encounter: Payer: Self-pay | Admitting: Rehabilitation

## 2021-04-30 ENCOUNTER — Other Ambulatory Visit: Payer: Self-pay

## 2021-04-30 DIAGNOSIS — R293 Abnormal posture: Secondary | ICD-10-CM

## 2021-04-30 DIAGNOSIS — Z483 Aftercare following surgery for neoplasm: Secondary | ICD-10-CM

## 2021-04-30 DIAGNOSIS — C50411 Malignant neoplasm of upper-outer quadrant of right female breast: Secondary | ICD-10-CM | POA: Diagnosis not present

## 2021-04-30 DIAGNOSIS — Z171 Estrogen receptor negative status [ER-]: Secondary | ICD-10-CM

## 2021-04-30 DIAGNOSIS — M25611 Stiffness of right shoulder, not elsewhere classified: Secondary | ICD-10-CM

## 2021-04-30 NOTE — Therapy (Signed)
?OUTPATIENT PHYSICAL THERAPY TREATMENT NOTE ? ? ?Patient Name: Jacqueline Adams ?MRN: [id number] ?DOB:1960/10/02, 61 y.o., female ?Today's Date: 04/30/2021 ? ?PCP: Robyne Peers, MD ?REFERRING PROVIDER:Vinay [name] MD ? ? PT End of Session - 04/30/21 1008   ? ? Visit Number 12   ? Number of Visits 15   ? Date for PT Re-Evaluation 05/25/21   ? PT Start Time 1010   ? PT Stop Time 1054   ? PT Time Calculation (min) 44 min   ? Activity Tolerance Patient tolerated treatment well   ? Behavior During Therapy [facility] for tasks assessed/performed   ? ?  ?  ? ?  ? ? ?Past Medical History:  ?Diagnosis Date  ? BRCA2 gene mutation positive in female   ? Breast cancer ([place]) 01/2020  ? right breast IDC  ? Bronchitis   ? Cervical spondylarthritis   ? Headache(784.0)   ? High cholesterol   ? Hypertension   ? Leukocytosis 2009  ? Multiple sclerosis ([place]) 2009  ? followed by Dr. Erling Cruz  ? Neuromuscular disorder ([place])   ? Pneumonia   ? Rhinitis   ? Seasonal allergies   ? Sinusitis   ? ?Past Surgical History:  ?Procedure Laterality Date  ? ABDOMINAL HYSTERECTOMY  2006  ? uterus, cervix, portion of fallopian tubes for AUB  ? BREAST LUMPECTOMY WITH RADIOACTIVE SEED AND SENTINEL LYMPH NODE BIOPSY Right 08/08/2020  ? Procedure: RIGHT BREAST LUMPECTOMY WITH RADIOACTIVE SEED X 3 AND SENTINEL LYMPH NODE BIOPSY;  Surgeon: Stark Klein, MD;  Location: [place];  Service: General;  Laterality: Right;  ? CESAREAN SECTION    ? x 3  ? CORRECTION HAMMER TOE Bilateral   ? PORT-A-CATH REMOVAL Right 09/21/2020  ? Procedure: REMOVAL PORT-A-CATH;  Surgeon: Stark Klein, MD;  Location: [place];  Service: General;  Laterality: Right;  ? PORTACATH PLACEMENT Right 02/22/2020  ? Procedure: RIGHT SUBCLAVIAN VEIN PORT PLACEMENT;  Surgeon: Donnie Mesa, MD;  Location: [place];  Service: General;  Laterality: Right;  ? ROBOTIC ASSISTED BILATERAL SALPINGO OOPHERECTOMY Bilateral 01/30/2021  ? Procedure: XI ROBOTIC ASSISTED BILATERAL SALPINGO  OOPHORECTOMY, LYSIS OF ADHESIONS, 1 HOUR;  Surgeon: Lafonda Mosses, MD;  [facility];  Service: Gynecology;  Laterality: Bilateral;  ? ?Patient Active Problem List  ? Diagnosis Date Noted  ? Bilateral ovarian cysts   ? Port-A-Cath in place 04/05/2020  ? Malignant neoplasm of upper-outer quadrant of right breast in female, estrogen receptor negative ([place]) 01/10/2020  ? High risk medication use 03/25/2019  ? Microcytic anemia 12/31/2017  ? 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 UACS 12/16/2017  ? History of pneumonia 11/12/2017  ? Morbid obesity ([place]) 08/19/2016  ? Numbness 05/09/2015  ? Other fatigue 05/09/2015  ? Arthralgia of hip 11/16/2014  ? Low back pain with sciatica 11/16/2014  ? Urinary frequency 11/16/2014  ? Encounter for general adult medical examination without abnormal findings 06/07/2014  ? DOE (dyspnea on exertion) 04/27/2014  ? Atypical chest pain 04/27/2014  ? Dyslipidemia 04/27/2014  ? HLD (hyperlipidemia) 02/14/2014  ? Cervical spondylosis without myelopathy 07/28/2013  ? Cervical pain 07/28/2013  ? CFIDS (chronic fatigue and immune dysfunction syndrome) ([place]) 07/28/2013  ? Chronic infection of sinus 07/28/2013  ? Headache, migraine 07/28/2013  ? 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 Spasms of the hands or feet 07/28/2013  ? DDD (degenerative disc disease), cervical 07/28/2013  ? Menopausal symptom 07/28/2013  ? Taking drug for chronic disease 07/28/2013  ? Vitamin D deficiency 07/28/2013  ?  Degeneration of intervertebral disc of cervical region 07/28/2013  ? Cough 04/06/2013  ? BRCA gene mutation positive in female 06/12/2011  ? Genetic susceptibility to malignant neoplasm of breast 06/12/2011  ? Multiple sclerosis ([place])   ? Leukocytosis   ? ? ?REFERRING DIAG: Right breast Cancer ? ?THERAPY DIAG:  ?Malignant neoplasm of upper-outer quadrant of right breast in female, estrogen receptor negative ([place]) ? ?Aftercare following surgery for neoplasm ? ?Stiffness of right shoulder, not elsewhere  classified ? ?Abnormal posture ? 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 She underwent neoadjuvant chemotherapy 02/23/2020 - 07/05/2020 followed by a right lumpectomy and sentinel node biopsy (3 negative nodes) on 08/08/2020. Completed radiation.  She has relapsing remitting multiple sclerosis since 2009 ? ?PRECAUTIONS: Right UE lymphedema risk ? ?SUBJECTIVE: Nothing new  ? ?PAIN: Are you having pain? No ? ?  ? TODAY'S TREATMENT  ?3/20/223 ?PT performed MLD to the Rt breast with pt permission: In supine: Short neck, 5 diaphragmatic breaths, bil axillary nodes and establishment of interaxillary pathway, R inguinal nodes and establishment of axilloinguinal pathway, then R breast moving fluid towards pathways spending extra time in any areas of fibrosis then retracing all steps. ?Then in left sidelying posterior interaxillary work.  ?STM to lateral breast with scar mobilization and fibrosis deeper pressure using cocoa butter ?PROM with MFR to the axilla ? ?04/27/21 ?PT performed MLD to the Rt breast with pt permission: In supine: Short neck, 5 diaphragmatic breaths, bil axillary nodes and establishment of interaxillary pathway, R inguinal nodes and establishment of axilloinguinal pathway, then R breast moving fluid towards pathways spending extra time in any areas of fibrosis then retracing all steps. ?Then in left sidelying posterior interaxillary work.  ?STM to lateral breast with scar mobilization and fibrosis deeper pressure using cocoa butter ?PROM with MFR to the axilla ? ?PATIENT EDUCATION:  ?Education details: see treatment note ?Person educated: Patient ?Education method: Explanation, Demonstration, Tactile cues, Verbal cues, and Handouts ?Education comprehension: verbalized understanding, returned demonstration, verbal cues required, tactile cues required, and needs further education ?  ? HOME EXERCISE PROGRAM: ?Self  MLD - see bottom of note ?  ?ASSESSMENT: ?  ?CLINICAL IMPRESSION: ?The Rt breast is much softer today even before starting MT.  More wrinkling evident.  Still with chronic hard edged seroma but overall stiffness and surrounding lymphedema decreasing.   ? ?OBJECTIVE IMPAIRMENTS increased fascial restrictions and impaired sensation.  ?  ?ACTIVITY LIMITATIONS  none .  ?  ?PERSONAL FACTORS Fitness and 1-2 comorbidities: SLNB, radiation  are also affecting patient's functional outcome.  ?  ?  ?REHAB POTENTIAL: Excellent ?  ?CLINICAL DECISION MAKING: Stable/uncomplicated ?  ?EVALUATION COMPLEXITY: Low ?  ?GOALS: ?Goals reviewed with patient? Yes ?  ?LONG TERM GOALS: ?  ?Pt will be ind with self MLD for the Rt breast ?Baseline: needs reeducation ?Target date: 05/25/2021 ?Goal status: INITIAL ?  ?2.  Pt will be ind with home stretches post radiation to decrease stiffness ?Baseline: no HEP ?Target date: 05/25/21    ?Goal status: INITIAL ?  ?PLAN: ?PT FREQUENCY: 2x/week ?  ?PT DURATION: 6 weeks ?  ?PLANNED INTERVENTIONS: Therapeutic exercises, Patient/Family education, Joint mobilization, Manual lymph drainage, and Manual therapy ?  ?PLAN FOR NEXT SESSION: cont Rt breast MLD and scar release, PROM,  pt may like strength ABC or similar exercises for general strength and mobility  ?  ?  ? ? ? ?Stark Bray, PT ?04/30/2021,  11:00 AM ? ?  ? ?

## 2021-05-02 ENCOUNTER — Ambulatory Visit: Payer: 59 | Admitting: Rehabilitation

## 2021-05-02 ENCOUNTER — Other Ambulatory Visit: Payer: Self-pay

## 2021-05-02 ENCOUNTER — Encounter: Payer: Self-pay | Admitting: Rehabilitation

## 2021-05-02 DIAGNOSIS — M25611 Stiffness of right shoulder, not elsewhere classified: Secondary | ICD-10-CM

## 2021-05-02 DIAGNOSIS — R293 Abnormal posture: Secondary | ICD-10-CM

## 2021-05-02 DIAGNOSIS — Z171 Estrogen receptor negative status [ER-]: Secondary | ICD-10-CM

## 2021-05-02 DIAGNOSIS — C50411 Malignant neoplasm of upper-outer quadrant of right female breast: Secondary | ICD-10-CM | POA: Diagnosis not present

## 2021-05-02 DIAGNOSIS — Z483 Aftercare following surgery for neoplasm: Secondary | ICD-10-CM

## 2021-05-02 NOTE — Therapy (Signed)
?OUTPATIENT PHYSICAL THERAPY TREATMENT NOTE ? ? ?Patient Name: Jacqueline Adams ?MRN: [id number] ?DOB:09/11/1960, 61 y.o., female ?Today's Date: 05/02/2021 ? ?PCP: Robyne Peers, MD ?REFERRING PROVIDER:Vinay [name] MD ? ? PT End of Session - 05/02/21 1602   ? ? Visit Number 13   ? Number of Visits 15   ? Date for PT Re-Evaluation 05/25/21   ? PT Start Time 1603   ? PT Stop Time 8101   ? PT Time Calculation (min) 43 min   ? Activity Tolerance Patient tolerated treatment well   ? Behavior During Therapy [facility] for tasks assessed/performed   ? ?  ?  ? ?  ? ? ?Past Medical History:  ?Diagnosis Date  ? BRCA2 gene mutation positive in female   ? Breast cancer ([place]) 01/2020  ? right breast IDC  ? Bronchitis   ? Cervical spondylarthritis   ? Headache(784.0)   ? High cholesterol   ? Hypertension   ? Leukocytosis 2009  ? Multiple sclerosis ([place]) 2009  ? followed by Dr. Erling Cruz  ? Neuromuscular disorder ([place])   ? Pneumonia   ? Rhinitis   ? Seasonal allergies   ? Sinusitis   ? ?Past Surgical History:  ?Procedure Laterality Date  ? ABDOMINAL HYSTERECTOMY  2006  ? uterus, cervix, portion of fallopian tubes for AUB  ? BREAST LUMPECTOMY WITH RADIOACTIVE SEED AND SENTINEL LYMPH NODE BIOPSY Right 08/08/2020  ? Procedure: RIGHT BREAST LUMPECTOMY WITH RADIOACTIVE SEED X 3 AND SENTINEL LYMPH NODE BIOPSY;  Surgeon: Stark Klein, MD;  Location: [place];  Service: General;  Laterality: Right;  ? CESAREAN SECTION    ? x 3  ? CORRECTION HAMMER TOE Bilateral   ? PORT-A-CATH REMOVAL Right 09/21/2020  ? Procedure: REMOVAL PORT-A-CATH;  Surgeon: Stark Klein, MD;  Location: [place];  Service: General;  Laterality: Right;  ? PORTACATH PLACEMENT Right 02/22/2020  ? Procedure: RIGHT SUBCLAVIAN VEIN PORT PLACEMENT;  Surgeon: Donnie [place], MD;  Location: [place];  Service: General;  Laterality: Right;  ? ROBOTIC ASSISTED BILATERAL SALPINGO OOPHERECTOMY Bilateral 01/30/2021  ? Procedure: XI ROBOTIC ASSISTED BILATERAL SALPINGO  OOPHORECTOMY, LYSIS OF ADHESIONS, 1 HOUR;  Surgeon: Lafonda Mosses, MD;  Location: [facility];  Service: Gynecology;  Laterality: Bilateral;  ? ?Patient Active Problem List  ? Diagnosis Date Noted  ? Bilateral ovarian cysts   ? Port-A-Cath in place 04/05/2020  ? Malignant neoplasm of upper-outer quadrant of right breast in female, estrogen receptor negative ([place]) 01/10/2020  ? High risk medication use 03/25/2019  ? Microcytic anemia 12/31/2017  ? 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 UACS 12/16/2017  ? History of pneumonia 11/12/2017  ? Morbid obesity ([place]) 08/19/2016  ? Numbness 05/09/2015  ? Other fatigue 05/09/2015  ? Arthralgia of hip 11/16/2014  ? Low back pain with sciatica 11/16/2014  ? Urinary frequency 11/16/2014  ? Encounter for general adult medical examination without abnormal findings 06/07/2014  ? DOE (dyspnea on exertion) 04/27/2014  ? Atypical chest pain 04/27/2014  ? Dyslipidemia 04/27/2014  ? HLD (hyperlipidemia) 02/14/2014  ? Cervical spondylosis without myelopathy 07/28/2013  ? Cervical pain 07/28/2013  ? CFIDS (chronic fatigue and immune dysfunction syndrome) ([place]) 07/28/2013  ? Chronic infection of sinus 07/28/2013  ? Headache, migraine 07/28/2013  ? 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 Spasms of the hands or feet 07/28/2013  ? DDD (degenerative disc disease), cervical 07/28/2013  ? Menopausal symptom 07/28/2013  ? Taking drug for chronic disease 07/28/2013  ? Vitamin D deficiency 07/28/2013  ?  Degeneration of intervertebral disc of cervical region 07/28/2013  ? Cough 04/06/2013  ? BRCA gene mutation positive in female 06/12/2011  ? Genetic susceptibility to malignant neoplasm of breast 06/12/2011  ? Multiple sclerosis ([place])   ? Leukocytosis   ? ? ?REFERRING DIAG: Right breast Cancer ? ?THERAPY DIAG:  ?Malignant neoplasm of upper-outer quadrant of right breast in female, estrogen receptor negative ([place]) ? ?Aftercare following surgery for neoplasm ? ?Abnormal posture ? ?Stiffness of right  shoulder, not elsewhere classified ? 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 She underwent neoadjuvant chemotherapy 02/23/2020 - 07/05/2020 followed by a right lumpectomy and sentinel node biopsy (3 negative nodes) on 08/08/2020. Completed radiation.  She has relapsing remitting multiple sclerosis since 2009 ? ?PRECAUTIONS: Right UE lymphedema risk ? ?SUBJECTIVE: It is feeling very good ? ?PAIN: Are you having pain? No ? ?  ? TODAY'S TREATMENT  ?05/02/21 ?PT performed MLD to the Rt breast with pt permission: In supine: Short neck, 5 diaphragmatic breaths, bil axillary nodes and establishment of interaxillary pathway, R inguinal nodes and establishment of axilloinguinal pathway, then R breast moving fluid towards pathways spending extra time in any areas of fibrosis then retracing all steps. ?Then in left sidelying posterior interaxillary work.  ?STM to lateral breast with scar mobilization and fibrosis deeper pressure using cocoa butter ?PROM with MFR to the axilla ? ?3/20/223 ?PT performed MLD to the Rt breast with pt permission: In supine: Short neck, 5 diaphragmatic breaths, bil axillary nodes and establishment of interaxillary pathway, R inguinal nodes and establishment of axilloinguinal pathway, then R breast moving fluid towards pathways spending extra time in any areas of fibrosis then retracing all steps. ?Then in left sidelying posterior interaxillary work.  ?STM to lateral breast with scar mobilization and fibrosis deeper pressure using cocoa butter ?PROM with MFR to the axilla ? ?04/27/21 ?PT performed MLD to the Rt breast with pt permission: In supine: Short neck, 5 diaphragmatic breaths, bil axillary nodes and establishment of interaxillary pathway, R inguinal nodes and establishment of axilloinguinal pathway, then R breast moving fluid towards pathways spending extra time in any areas of fibrosis then retracing  all steps. ?Then in left sidelying posterior interaxillary work.  ?STM to lateral breast with scar mobilization and fibrosis deeper pressure using cocoa butter ?PROM with MFR to the axilla ? ?PATIENT EDUCATION:  ?Education details: see treatment note ?Person educated: Patient ?Education method: Explanation, Demonstration, Tactile cues, Verbal cues, and Handouts ?Education comprehension: verbalized understanding, returned demonstration, verbal cues required, tactile cues required, and needs further education ?  ? HOME EXERCISE PROGRAM: ?Self MLD - see bottom of note ?  ?ASSESSMENT: ?  ?CLINICAL IMPRESSION: ?The Rt breast is much softer today even before starting MT.  More wrinkling evident.  The seroma is even softer today and the breast is not full at all.  1 more week most likely.  ? ?OBJECTIVE IMPAIRMENTS increased fascial restrictions and impaired sensation.  ?  ?ACTIVITY LIMITATIONS  none .  ?  ?PERSONAL FACTORS Fitness and 1-2 comorbidities: SLNB, radiation  are also affecting patient's functional outcome.  ?  ?  ?REHAB POTENTIAL: Excellent ?  ?CLINICAL DECISION MAKING: Stable/uncomplicated ?  ?EVALUATION COMPLEXITY: Low ?  ?GOALS: ?Goals reviewed with patient? Yes ?  ?LONG TERM GOALS: ?  ?Pt will be ind with self MLD for the Rt breast ?Baseline: needs reeducation ?Target date: 05/25/2021 ?Goal status: INITIAL ?  ?2.  Pt will be  ind with home stretches post radiation to decrease stiffness ?Baseline: no HEP ?Target date: 05/25/21    ?Goal status: INITIAL ?  ?PLAN: ?PT FREQUENCY: 2x/week ?  ?PT DURATION: 6 weeks ?  ?PLANNED INTERVENTIONS: Therapeutic exercises, Patient/Family education, Joint mobilization, Manual lymph drainage, and Manual therapy ?  ?PLAN FOR NEXT SESSION: cont Rt breast MLD and scar release, PROM,  pt may like strength ABC or similar exercises for general strength and mobility  ?  ?  ? ? ? ?Stark Bray, PT ?05/02/2021, 4:55 PM ? ?  ? ?

## 2021-05-03 ENCOUNTER — Other Ambulatory Visit: Payer: Self-pay | Admitting: Hematology and Oncology

## 2021-05-07 ENCOUNTER — Ambulatory Visit: Payer: 59

## 2021-05-07 ENCOUNTER — Other Ambulatory Visit: Payer: Self-pay

## 2021-05-07 ENCOUNTER — Other Ambulatory Visit: Payer: Self-pay | Admitting: Hematology and Oncology

## 2021-05-07 DIAGNOSIS — Z171 Estrogen receptor negative status [ER-]: Secondary | ICD-10-CM

## 2021-05-07 DIAGNOSIS — Z483 Aftercare following surgery for neoplasm: Secondary | ICD-10-CM

## 2021-05-07 DIAGNOSIS — R293 Abnormal posture: Secondary | ICD-10-CM

## 2021-05-07 DIAGNOSIS — C50411 Malignant neoplasm of upper-outer quadrant of right female breast: Secondary | ICD-10-CM | POA: Diagnosis not present

## 2021-05-07 DIAGNOSIS — M25611 Stiffness of right shoulder, not elsewhere classified: Secondary | ICD-10-CM

## 2021-05-07 NOTE — Patient Instructions (Signed)

## 2021-05-07 NOTE — Telephone Encounter (Signed)
Hey John!  Can you please review and refill if needed.  I believe Mrs. Gougeon met with you last.  Thanks so much! ? ?

## 2021-05-07 NOTE — Therapy (Signed)
?OUTPATIENT PHYSICAL THERAPY TREATMENT NOTE ? ? ?Patient Name: Jacqueline Adams ?MRN: [id number] ?DOB:02-18-1960, 61 y.o., female ?Today's Date: 05/07/2021 ? ?PCP: Robyne Peers, MD ?REFERRING PROVIDER:Vinay [name] MD ? ? PT End of Session - 05/07/21 1008   ? ? Visit Number 14   ? Number of Visits 15   ? Date for PT Re-Evaluation 05/25/21   ? PT Start Time 1008   ? PT Stop Time 1055   ? PT Time Calculation (min) 47 min   ? Activity Tolerance Patient tolerated treatment well   ? Behavior During Therapy [facility] for tasks assessed/performed   ? ?  ?  ? ?  ? ? ?Past Medical History:  ?Diagnosis Date  ? BRCA2 gene mutation positive in female   ? Breast cancer ([place]) 01/2020  ? right breast IDC  ? Bronchitis   ? Cervical spondylarthritis   ? Headache(784.0)   ? High cholesterol   ? Hypertension   ? Leukocytosis 2009  ? Multiple sclerosis ([place]) 2009  ? followed by Dr. Erling Cruz  ? Neuromuscular disorder ([place])   ? Pneumonia   ? Rhinitis   ? Seasonal allergies   ? Sinusitis   ? ?Past Surgical History:  ?Procedure Laterality Date  ? ABDOMINAL HYSTERECTOMY  2006  ? uterus, cervix, portion of fallopian tubes for AUB  ? BREAST LUMPECTOMY WITH RADIOACTIVE SEED AND SENTINEL LYMPH NODE BIOPSY Right 08/08/2020  ? Procedure: RIGHT BREAST LUMPECTOMY WITH RADIOACTIVE SEED X 3 AND SENTINEL LYMPH NODE BIOPSY;  Surgeon: Stark Klein, MD;  Location: [place];  Service: General;  Laterality: Right;  ? CESAREAN SECTION    ? x 3  ? CORRECTION HAMMER TOE Bilateral   ? PORT-A-CATH REMOVAL Right 09/21/2020  ? Procedure: REMOVAL PORT-A-CATH;  Surgeon: Stark Klein, MD;  Location: [place];  Service: General;  Laterality: Right;  ? PORTACATH PLACEMENT Right 02/22/2020  ? Procedure: RIGHT SUBCLAVIAN VEIN PORT PLACEMENT;  Surgeon: Donnie [place], MD;  Location: [place];  Service: General;  Laterality: Right;  ? ROBOTIC ASSISTED BILATERAL SALPINGO OOPHERECTOMY Bilateral 01/30/2021  ? Procedure: XI ROBOTIC ASSISTED BILATERAL SALPINGO  OOPHORECTOMY, LYSIS OF ADHESIONS, 1 HOUR;  Surgeon: Lafonda Mosses, MD;  [facility];  Service: Gynecology;  Laterality: Bilateral;  ? ?Patient Active Problem List  ? Diagnosis Date Noted  ? Bilateral ovarian cysts   ? Port-A-Cath in place 04/05/2020  ? Malignant neoplasm of upper-outer quadrant of right breast in female, estrogen receptor negative ([place]) 01/10/2020  ? High risk medication use 03/25/2019  ? Microcytic anemia 12/31/2017  ? 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 UACS 12/16/2017  ? History of pneumonia 11/12/2017  ? Morbid obesity ([place]) 08/19/2016  ? Numbness 05/09/2015  ? Other fatigue 05/09/2015  ? Arthralgia of hip 11/16/2014  ? Low back pain with sciatica 11/16/2014  ? Urinary frequency 11/16/2014  ? Encounter for general adult medical examination without abnormal findings 06/07/2014  ? DOE (dyspnea on exertion) 04/27/2014  ? Atypical chest pain 04/27/2014  ? Dyslipidemia 04/27/2014  ? HLD (hyperlipidemia) 02/14/2014  ? Cervical spondylosis without myelopathy 07/28/2013  ? Cervical pain 07/28/2013  ? CFIDS (chronic fatigue and immune dysfunction syndrome) ([place]) 07/28/2013  ? Chronic infection of sinus 07/28/2013  ? Headache, migraine 07/28/2013  ? DS (disseminated sclerosis) ([place]) 07/28/2013  ? Spasms of the hands or feet 07/28/2013  ? DDD (degenerative disc disease), cervical 07/28/2013  ? Menopausal symptom 07/28/2013  ? Taking drug for chronic disease 07/28/2013  ? Vitamin D deficiency 07/28/2013  ?  Degeneration of intervertebral disc of cervical region 07/28/2013  ? Cough 04/06/2013  ? BRCA gene mutation positive in female 06/12/2011  ? Genetic susceptibility to malignant neoplasm of breast 06/12/2011  ? Multiple sclerosis ([place])   ? Leukocytosis   ? ? ?REFERRING DIAG: Right breast Cancer ? ?THERAPY DIAG:  ?Malignant neoplasm of upper-outer quadrant of right breast in female, estrogen receptor negative ([place]) ? ?Aftercare following surgery for neoplasm ? ?Abnormal posture ? ?Stiffness of right  shoulder, not elsewhere classified ? 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 She underwent neoadjuvant chemotherapy 02/23/2020 - 07/05/2020 followed by a right lumpectomy and sentinel node biopsy (3 negative nodes) on 08/08/2020. Completed radiation.  She has relapsing remitting multiple sclerosis since 2009 ? ?PRECAUTIONS: Right UE lymphedema risk ? ?SUBJECTIVE: I am still wearing the foam in my bra and my breast is feeling so much better. Swelling ? And firmness is 60-70% better. No more pain like I had before. I feel like I am doing well with the MLD ? ?PAIN: Are you having pain? No, but sometimes tender under the arm ? ?  ? TODAY'S TREATMENT  ? ?05/07/2021 ?PT performed MLD to the Rt breast with pt permission and then had pt also practice: In supine: Short neck, 5 diaphragmatic breaths, bil axillary nodes and establishment of interaxillary pathway, R inguinal nodes and establishment of axilloinguinal pathway, then R breast moving fluid towards pathways spending extra time in any areas of fibrosis then retracing all steps. ?STM to lateral breast with scar mobilization and fibrosis deeper pressure  ?PROM right shoulder with MFR to the axilla flexion, scaption, abd, ER ?Educated in supine scapular series x 5 with yellow band and pt given yellow band for home use ? ?05/02/21 ?PT performed MLD to the Rt breast with pt permission: In supine: Short neck, 5 diaphragmatic breaths, bil axillary nodes and establishment of interaxillary pathway, R inguinal nodes and establishment of axilloinguinal pathway, then R breast moving fluid towards pathways spending extra time in any areas of fibrosis then retracing all steps. ?Then in left sidelying posterior interaxillary work.  ?STM to lateral breast with scar mobilization and fibrosis deeper pressure using cocoa butter ?PROM with MFR to the axilla ? ?3/20/223 ?PT performed MLD to the Rt  breast with pt permission: In supine: Short neck, 5 diaphragmatic breaths, bil axillary nodes and establishment of interaxillary pathway, R inguinal nodes and establishment of axilloinguinal pathway, then R breast moving fluid towards pathways spending extra time in any areas of fibrosis then retracing all steps. ?Then in left sidelying posterior interaxillary work.  ?STM to lateral breast with scar mobilization and fibrosis deeper pressure using cocoa butter ?PROM with MFR to the axilla ? ?04/27/21 ?PT performed MLD to the Rt breast with pt permission: In supine: Short neck, 5 diaphragmatic breaths, bil axillary nodes and establishment of interaxillary pathway, R inguinal nodes and establishment of axilloinguinal pathway, then R breast moving fluid towards pathways spending extra time in any areas of fibrosis then retracing all steps. ?Then in left sidelying posterior interaxillary work.  ?STM to lateral breast with scar mobilization and fibrosis deeper pressure using cocoa butter ?PROM with MFR to the axilla ? ?PATIENT EDUCATION:  ?Education details: supine scapular series with yellow band ?Person educated: Patient ?Education method: Explanation, Demonstration, Tactile cues, Verbal cues, and Handouts ?Education comprehension: verbalized understanding, returned demonstration, verbal cues required, tactile cues required, and needs further education ?  ? HOME EXERCISE  PROGRAM: ?Self MLD - see bottom of note, Supine scapular series with yellow band ?  ?ASSESSMENT: ?  ?CLINICAL IMPRESSION: ?Incision area at breast is slightly firmer today than when this therapist saw pt however she is not wearing her nice compression bra or the foam. Therapist performed right breast MLD and then had pt perform. Occasional verbal and tactile cues were required, however pt did very well overall. Instructed in supine scapular series and pt does need review ? ?OBJECTIVE IMPAIRMENTS increased fascial restrictions and impaired sensation.  ?   ?ACTIVITY LIMITATIONS  none .  ?  ?PERSONAL FACTORS Fitness and 1-2 comorbidities: SLNB, radiation  are also affecting patient's functional outcome.  ?  ?  ?REHAB POTENTIAL: Excellent ?  ?CLINICAL DECISION MAKING: St

## 2021-05-09 ENCOUNTER — Ambulatory Visit: Payer: 59 | Admitting: Rehabilitation

## 2021-05-09 ENCOUNTER — Encounter: Payer: Self-pay | Admitting: Rehabilitation

## 2021-05-09 DIAGNOSIS — Z483 Aftercare following surgery for neoplasm: Secondary | ICD-10-CM

## 2021-05-09 DIAGNOSIS — M25611 Stiffness of right shoulder, not elsewhere classified: Secondary | ICD-10-CM

## 2021-05-09 DIAGNOSIS — C50411 Malignant neoplasm of upper-outer quadrant of right female breast: Secondary | ICD-10-CM | POA: Diagnosis not present

## 2021-05-09 DIAGNOSIS — Z171 Estrogen receptor negative status [ER-]: Secondary | ICD-10-CM

## 2021-05-09 DIAGNOSIS — R293 Abnormal posture: Secondary | ICD-10-CM

## 2021-05-09 NOTE — Therapy (Signed)
?OUTPATIENT PHYSICAL THERAPY TREATMENT NOTE ? ? ?Patient Name: Jacqueline Adams ?MRN: [id number] ?DOB:05/21/1960, 61 y.o., female ?Today's Date: 05/09/2021 ? ?PCP: Robyne Peers, MD ?REFERRING PROVIDER:Vinay [name] MD ? ? PT End of Session - 05/09/21 1557   ? ? Visit Number 15   ? Date for PT Re-Evaluation 05/25/21   ? Authorization - Number of Visits 30   ? PT Start Time 1600   ? PT Stop Time 6761   ? PT Time Calculation (min) 54 min   ? Activity Tolerance Patient tolerated treatment well   ? Behavior During Therapy [facility] for tasks assessed/performed   ? ?  ?  ? ?  ? ? ?Past Medical History:  ?Diagnosis Date  ? BRCA2 gene mutation positive in female   ? Breast cancer ([place]) 01/2020  ? right breast IDC  ? Bronchitis   ? Cervical spondylarthritis   ? Headache(784.0)   ? High cholesterol   ? Hypertension   ? Leukocytosis 2009  ? Multiple sclerosis ([place]) 2009  ? followed by Dr. Erling Cruz  ? Neuromuscular disorder ([place])   ? Pneumonia   ? Rhinitis   ? Seasonal allergies   ? Sinusitis   ? ?Past Surgical History:  ?Procedure Laterality Date  ? ABDOMINAL HYSTERECTOMY  2006  ? uterus, cervix, portion of fallopian tubes for AUB  ? BREAST LUMPECTOMY WITH RADIOACTIVE SEED AND SENTINEL LYMPH NODE BIOPSY Right 08/08/2020  ? Procedure: RIGHT BREAST LUMPECTOMY WITH RADIOACTIVE SEED X 3 AND SENTINEL LYMPH NODE BIOPSY;  Surgeon: Stark Klein, MD;  Location: [place];  Service: General;  Laterality: Right;  ? CESAREAN SECTION    ? x 3  ? CORRECTION HAMMER TOE Bilateral   ? PORT-A-CATH REMOVAL Right 09/21/2020  ? Procedure: REMOVAL PORT-A-CATH;  Surgeon: Stark Klein, MD;  Location: [place];  Service: General;  Laterality: Right;  ? PORTACATH PLACEMENT Right 02/22/2020  ? Procedure: RIGHT SUBCLAVIAN VEIN PORT PLACEMENT;  Surgeon: Donnie Mesa, MD;  Location: [place];  Service: General;  Laterality: Right;  ? ROBOTIC ASSISTED BILATERAL SALPINGO OOPHERECTOMY Bilateral 01/30/2021  ? Procedure: XI ROBOTIC ASSISTED BILATERAL  SALPINGO OOPHORECTOMY, LYSIS OF ADHESIONS, 1 HOUR;  Surgeon: Lafonda Mosses, MD;  Location: [facility];  Service: Gynecology;  Laterality: Bilateral;  ? ?Patient Active Problem List  ? Diagnosis Date Noted  ? Bilateral ovarian cysts   ? Port-A-Cath in place 04/05/2020  ? Malignant neoplasm of upper-outer quadrant of right breast in female, estrogen receptor negative ([place]) 01/10/2020  ? High risk medication use 03/25/2019  ? Microcytic anemia 12/31/2017  ? 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 UACS 12/16/2017  ? History of pneumonia 11/12/2017  ? Morbid obesity ([place]) 08/19/2016  ? Numbness 05/09/2015  ? Other fatigue 05/09/2015  ? Arthralgia of hip 11/16/2014  ? Low back pain with sciatica 11/16/2014  ? Urinary frequency 11/16/2014  ? Encounter for general adult medical examination without abnormal findings 06/07/2014  ? DOE (dyspnea on exertion) 04/27/2014  ? Atypical chest pain 04/27/2014  ? Dyslipidemia 04/27/2014  ? HLD (hyperlipidemia) 02/14/2014  ? Cervical spondylosis without myelopathy 07/28/2013  ? Cervical pain 07/28/2013  ? CFIDS (chronic fatigue and immune dysfunction syndrome) ([place]) 07/28/2013  ? Chronic infection of sinus 07/28/2013  ? Headache, migraine 07/28/2013  ? DS (disseminated sclerosis) ([place]) 07/28/2013  ? Spasms of the hands or feet 07/28/2013  ? DDD (degenerative disc disease), cervical 07/28/2013  ? Menopausal symptom 07/28/2013  ? Taking drug for chronic disease 07/28/2013  ? Vitamin D deficiency  07/28/2013  ? Degeneration of intervertebral disc of cervical region 07/28/2013  ? Cough 04/06/2013  ? BRCA gene mutation positive in female 06/12/2011  ? Genetic susceptibility to malignant neoplasm of breast 06/12/2011  ? Multiple sclerosis ([place])   ? Leukocytosis   ? ? ?REFERRING DIAG: Right breast Cancer ? ?THERAPY DIAG:  ?Malignant neoplasm of upper-outer quadrant of right breast in female, estrogen receptor negative ([place]) ? ?Aftercare following surgery for neoplasm ? ?Abnormal posture ? ?Stiffness of right  shoulder, not elsewhere classified ? 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 She underwent neoadjuvant chemotherapy 02/23/2020 - 07/05/2020 followed by a right lumpectomy and sentinel node biopsy (3 negative nodes) on 08/08/2020. Completed radiation.  She has relapsing remitting multiple sclerosis since 2009 ? ?PRECAUTIONS: Right UE lymphedema risk ? ?SUBJECTIVE: I feel a lot better than I did before. Still some tenderness with the movement.   ? ?PAIN: Are you having pain? No, but sometimes tender under the arm ? ?  ? TODAY'S TREATMENT  ?05/09/21 ?PT performed MLD to the Rt breast with pt permission and then had pt also practice: In supine: Short neck, 5 diaphragmatic breaths, bil axillary nodes and establishment of interaxillary pathway, R inguinal nodes and establishment of axilloinguinal pathway, then R breast moving fluid towards pathways spending extra time in any areas of fibrosis then retracing all steps. ?STM to lateral breast with scar mobilization and fibrosis deeper pressure  ?PROM right shoulder with MFR to the axilla flexion, scaption, abd, ER ?supine scapular series x 5 each with yellow band x 73min ?Assessed goals and plan x 20min ? ?05/07/2021 ?PT performed MLD to the Rt breast with pt permission and then had pt also practice: In supine: Short neck, 5 diaphragmatic breaths, bil axillary nodes and establishment of interaxillary pathway, R inguinal nodes and establishment of axilloinguinal pathway, then R breast moving fluid towards pathways spending extra time in any areas of fibrosis then retracing all steps. ?STM to lateral breast with scar mobilization and fibrosis deeper pressure  ?PROM right shoulder with MFR to the axilla flexion, scaption, abd, ER ?Educated in supine scapular series x 5 with yellow band and pt given yellow band for home use ? ?05/02/21 ?PT performed MLD to the Rt breast with pt  permission: In supine: Short neck, 5 diaphragmatic breaths, bil axillary nodes and establishment of interaxillary pathway, R inguinal nodes and establishment of axilloinguinal pathway, then R breast moving fluid towards pathways spending extra time in any areas of fibrosis then retracing all steps. ?Then in left sidelying posterior interaxillary work.  ?STM to lateral breast with scar mobilization and fibrosis deeper pressure using cocoa butter ?PROM with MFR to the axilla ? ?PATIENT EDUCATION:  ?Education details: supine scapular series with yellow band ?Person educated: Patient ?Education method: Explanation, Demonstration, Tactile cues, Verbal cues, and Handouts ?Education comprehension: verbalized understanding, returned demonstration, verbal cues required, tactile cues required, and needs further education ?  ? HOME EXERCISE PROGRAM: ?Self MLD - see bottom of note, Supine scapular series with yellow band ?  ?ASSESSMENT: ? ?Right Upper Extremity Lymphedema    ?  ?09/07/20  05/09/21 ?10 cm Proximal to Olecranon Process ?34.5 cm   35 ?  ?Olecranon Process ?29.1 cm   30 ?  ?10 cm Proximal to Ulnar Styloid Process ?24.8 cm   25 ?  ?Just Proximal to Ulnar Styloid Process ?17.3 cm   17.7 ?  ?Across Hand at [employer] ?  20.6 cm   20 ?  ?At [place] of 2nd Digit ?6.6 cm   6.6 ? ?  ?CLINICAL IMPRESSION: ?Pt continues with fibrotic Rt breast improved with PT sessions and MT.  Pt will continue with visits as she sees so much improvement compared to self MLD.  May be ready for DC pending availability of flexitouch.   ? ?Pt continues with chronic lymphedema after at least 4 weeks of compression, elevation, and exercises provided with PT treatment and self management.  ? ?Pt demonstrates hyperplasia and fibrosis with increased size and is at risk for hyperkeratosis and infection with untreated lymphedema.   ? ?Truncal involvement is present ? ?Pt has also failed a basic pump at this time and would benefit from an advanced model  to provide proximal clearance and a multi chambered approach ? ? ?OBJECTIVE IMPAIRMENTS increased fascial restrictions and impaired sensation.  ?  ?ACTIVITY LIMITATIONS  none .  ?  ?PERSONAL FACTORS [employer]

## 2021-05-11 NOTE — Progress Notes (Signed)
? ?Patient Care Team: ?Robyne Peers, MD as PCP - General (Family Medicine) ?Jerrell Belfast, MD ?Mauro Kaufmann, RN as Oncology Nurse Navigator ?Rockwell Germany, RN as Oncology Nurse Navigator ?Donnie Mesa, MD as Consulting Physician (General Surgery) ?Nicholas Lose, MD as Consulting Physician (Hematology and Oncology) ?Kyung Rudd, MD as Consulting Physician (Radiation Oncology) ?Sater, Nanine Means, MD (Neurology) ? ?DIAGNOSIS:  ?Encounter Diagnoses  ?Name Primary?  ? Malignant neoplasm of upper-outer quadrant of right breast in female, estrogen receptor negative (Jacqueline Adams)   ? BRCA gene mutation positive in female Yes  ? ? ?SUMMARY OF ONCOLOGIC HISTORY: ?Oncology History  ?Malignant neoplasm of upper-outer quadrant of right breast in female, estrogen receptor negative (Jacqueline Adams)  ?01/10/2020 Initial Diagnosis  ? Palpable right breast mass. Mammogram showed a 1.8cm mass at the 10:30 position 2cm from the nipple, a 1.5cm mass at the 10:30 position 7cm from the nipple, and indeterminate calcifications between the two masses in the right breast. 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  ?  ?01/12/2020 Cancer Staging  ? Staging form: Breast, AJCC 8th Edition ?- Clinical stage from 01/12/2020: Stage IB (cT1c, cN0, cM0, G3, ER-, PR-, HER2-) - Signed by Nicholas Lose, MD on 02/03/2020 ? ?  ?02/23/2020 - 07/05/2020 Chemotherapy  ? Patient is on Treatment Plan : BREAST Dose Dense AC q14d / CARBOplatin D1 + PACLitaxel D1,8,15 q21d  ?   ?08/08/2020 Surgery  ? Right breast lumpectomy: scattered foci of DCIS with calcifications, intermediate to high-grade, largest measuring 0.3 cm, no residual invasive carcinoma, resection margins negative for DCIS, and all 3 axillary nodes negative for carcinoma ?  ?11/21/2020 - 12/18/2020 Radiation Therapy  ? 11/21/2020 through 12/18/2020 ?Site Technique Total Dose (Gy) Dose per Fx (Gy) Completed Fx Beam Energies  ?Breast,  Right: Breast_Rt 3D 42.56/42.56 2.66 16/16 10X  ?Breast, Right: Breast_Rt_Bst 3D 8/8 2 4/4 6X, 10X  ?  ? ? ?CHIEF COMPLIANT: Follow-up to  discuss olaparib related toxicities ?  ? ?INTERVAL HISTORY: Jacqueline Adams is a 61 year old with above-mentioned still BRCA2 mutation who had triple negative breast cancer and had completed neoadjuvant chemotherapy followed by right lumpectomy in June 2022. She states that everything is going great. Fatigue and nausea is about the same but she can control it.  ? ? ?ALLERGIES:  is allergic to nabumetone, procaine, and shellfish allergy. ? ?MEDICATIONS:  ?Current Outpatient Medications  ?Medication Sig Dispense Refill  ? AVONEX PREFILLED 30 MCG/0.5ML PSKT injection INJECT ONE SYRINGE INTRAMUSCULARLY ONCE WEEKLY. REFRIGERATE. PROTECT FROM LIGHT. ALLOW TO WARM TO ROOM TEMPERATURE PRIOR TO USE. 3 each 3  ? Calcium Carb-Cholecalciferol (CALCIUM 600+D3 PO) Take 1 tablet by mouth in the morning and at bedtime.    ? ELDERBERRY PO Take by mouth. Patient report taking 2 gummies daily    ? Ferrous Sulfate Dried (SLOW RELEASE IRON) 45 MG TBCR Take 45 mg by mouth in the morning.    ? fexofenadine (ALLEGRA) 180 MG tablet Take 180 mg by mouth daily as needed for allergies.    ? fluticasone (FLONASE) 50 MCG/ACT nasal spray Place 2 sprays into both nostrils daily. (Patient taking differently: Place 2 sprays into both nostrils daily as needed for allergies.) 16 g 2  ? ibuprofen (ADVIL) 200 MG tablet Take 400 mg by mouth every Friday. W/Avonex injection    ? KLOR-CON M20 20 MEQ tablet TAKE 1 TABLET BY MOUTH EVERY DAY IN THE MORNING 30 tablet  1  ? meclizine (ANTIVERT) 25 MG tablet Take 1 tablet (25 mg total) by mouth 3 (three) times daily as needed for dizziness. 30 tablet 0  ? metoprolol succinate (TOPROL-XL) 25 MG 24 hr tablet Take 25 mg by mouth daily.    ? Multiple Vitamin (MULTIVITAMIN WITH MINERALS) TABS tablet Take 1 tablet by mouth every evening. Centrum    ? olaparib (LYNPARZA) 150 MG  tablet Take 2 tablets (300 mg total) by mouth 2 (two) times daily. Swallow whole. May take with food to decrease nausea and vomiting. 120 tablet 5  ? Omega-3 Fatty Acids (FISH OIL) 1000 MG CPDR Take 1,000 mg by mouth daily.    ? simvastatin (ZOCOR) 20 MG tablet Take 20 mg by mouth every evening.    ? VENTOLIN HFA 108 (90 BASE) MCG/ACT inhaler Inhale 1-2 puffs into the lungs every 6 (six) hours as needed for shortness of breath or wheezing.    ? ?No current facility-administered medications for this visit.  ? ? ?PHYSICAL EXAMINATION: ?ECOG PERFORMANCE STATUS: 1 - Symptomatic but completely ambulatory ? ?Vitals:  ? 05/14/21 0933  ?BP: 122/64  ?Pulse: (!) 104  ?Resp: 18  ?Temp: (!) 97.5 ?F (36.4 ?C)  ?SpO2: 99%  ? ?Filed Weights  ? 05/14/21 0933  ?Weight: 226 lb 11.2 oz (102.8 kg)  ? ?  ? ?LABORATORY DATA:  ?I have reviewed the data as listed ? ?  Latest Ref Rng & Units 05/14/2021  ?  9:18 AM 04/16/2021  ?  9:45 AM 03/20/2021  ?  8:28 AM  ?CMP  ?Glucose 70 - 99 mg/dL 136   133   100    ?BUN 6 - 20 mg/dL 8   6   10     ?Creatinine 0.44 - 1.00 mg/dL 0.71   0.73   0.63    ?Sodium 135 - 145 mmol/L 142   141   141    ?Potassium 3.5 - 5.1 mmol/L 3.5   3.6   3.4    ?Chloride 98 - 111 mmol/L 106   106   106    ?CO2 22 - 32 mmol/L 29   26   28     ?Calcium 8.9 - 10.3 mg/dL 8.8   9.1   9.3    ?Total Protein 6.5 - 8.1 g/dL 7.2   7.4   7.4    ?Total Bilirubin 0.3 - 1.2 mg/dL 0.5   0.4   0.5    ?Alkaline Phos 38 - 126 U/L 77   83   78    ?AST 15 - 41 U/L 19   17   15     ?ALT 0 - 44 U/L 18   15   14     ? ? ?Lab Results  ?Component Value Date  ? WBC 9.6 05/14/2021  ? HGB 12.2 05/14/2021  ? HCT 37.0 05/14/2021  ? MCV 80.8 05/14/2021  ? PLT 253 05/14/2021  ? NEUTROABS 6.6 05/14/2021  ? ? ?ASSESSMENT & PLAN:  ?Malignant neoplasm of upper-outer quadrant of right breast in female, estrogen receptor negative (Jacqueline Adams) ?01/10/2020:Palpable right breast mass. Mammogram showed a 1.8cm mass at the 10:30 position 2cm from the nipple, a 1.5cm mass at the  10:30 position 7cm from the nipple, and indeterminate calcifications between the two masses in the right breast. 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  ?BRCA2 Mutation ?  ?Treatment plan: ?1. Neoadjuvant chemotherapy with Adriamycin and  Cytoxan every 2 weeks ?4 followed by Taxol weekly ?12 with carboplatin and  every 3 weeks x4 completed 07/05/2020 ?2. 08/08/20 breast conserving surgery with sentinel lymph node study : scattered foci of DCIS, margins Neg, 0/3 LN Neg ?3. Adjuvant radiation therapy 11/22/20- 12/18/20 ?4. Adjuvant olaparib started 02/20/21 ?[facility] nausea study ?  ?Breast MRI 01/14/2020: 2 cm spiculated mass UOQ right breast, 2.1 cm spiculated mass UOQ right breast, biopsy-proven malignancies.  7 mm suspicious enhancing mass: Biopsy 02/02/2020: High-grade DCIS ?----------------------------------------------------------------------------------------------------------------------------------------------- ?Olaparib: Lonie Peak is a PARP inhibitor started 02/20/2021 (1 year of total therapy) ?  ?[place] trial showed that the recurrence rate improves with olaparib.  86% versus 77%. ?Dose: 300 mg twice daily  ?Adverse effects:  ?1.  Very mild nausea ?2. fatigue ?  ?Breast cancer surveillance: Breast MRI will be done in August.  Mammograms were done in February ?she had prophylactic oophorectomy. ?  ?Return to clinic in 4 weeks with labs and follow-up with [name].  After that we can see her every 2 months.  I will make an appointment for her in July. ?  ? ? ? ?Orders Placed This Encounter  ?Procedures  ? MR BREAST BILATERAL W WO CONTRAST INC CAD  ?  Standing Status:   Future  ?  Standing Expiration Date:   05/15/2022  ?  Order Specific Question:   If indicated for the ordered procedure, I authorize the administration of contrast media per Radiology protocol  ?  Answer:   Yes  ?  Order Specific Question:   What is the patient's sedation  requirement?  ?  Answer:   No Sedation  ?  Order Specific Question:   Does the patient have a pacemaker or implanted devices?  ?  Answer:   No  ?  Order Specific Question:   Preferred imaging location?  ?  Answer:

## 2021-05-14 ENCOUNTER — Inpatient Hospital Stay: Payer: 59 | Admitting: Hematology and Oncology

## 2021-05-14 ENCOUNTER — Inpatient Hospital Stay: Payer: 59 | Attending: Hematology and Oncology

## 2021-05-14 ENCOUNTER — Other Ambulatory Visit: Payer: Self-pay

## 2021-05-14 VITALS — BP 122/64 | HR 104 | Temp 97.5°F | Resp 18 | Ht 64.0 in | Wt 226.7 lb

## 2021-05-14 DIAGNOSIS — Z9221 Personal history of antineoplastic chemotherapy: Secondary | ICD-10-CM | POA: Insufficient documentation

## 2021-05-14 DIAGNOSIS — Z853 Personal history of malignant neoplasm of breast: Secondary | ICD-10-CM | POA: Insufficient documentation

## 2021-05-14 DIAGNOSIS — Z1509 Genetic susceptibility to other malignant neoplasm: Secondary | ICD-10-CM

## 2021-05-14 DIAGNOSIS — Z1501 Genetic susceptibility to malignant neoplasm of breast: Secondary | ICD-10-CM | POA: Diagnosis not present

## 2021-05-14 DIAGNOSIS — C50411 Malignant neoplasm of upper-outer quadrant of right female breast: Secondary | ICD-10-CM

## 2021-05-14 DIAGNOSIS — G35 Multiple sclerosis: Secondary | ICD-10-CM | POA: Insufficient documentation

## 2021-05-14 DIAGNOSIS — Z171 Estrogen receptor negative status [ER-]: Secondary | ICD-10-CM

## 2021-05-14 DIAGNOSIS — Z1502 Genetic susceptibility to malignant neoplasm of ovary: Secondary | ICD-10-CM | POA: Diagnosis not present

## 2021-05-14 DIAGNOSIS — Z923 Personal history of irradiation: Secondary | ICD-10-CM | POA: Diagnosis not present

## 2021-05-14 DIAGNOSIS — Z79899 Other long term (current) drug therapy: Secondary | ICD-10-CM | POA: Insufficient documentation

## 2021-05-14 LAB — CBC WITH DIFFERENTIAL (CANCER CENTER ONLY)
Abs Immature Granulocytes: 0.03 10*3/uL (ref 0.00–0.07)
Basophils Absolute: 0 10*3/uL (ref 0.0–0.1)
Basophils Relative: 0 %
Eosinophils Absolute: 0.2 10*3/uL (ref 0.0–0.5)
Eosinophils Relative: 2 %
HCT: 37 % (ref 36.0–46.0)
Hemoglobin: 12.2 g/dL (ref 12.0–15.0)
Immature Granulocytes: 0 %
Lymphocytes Relative: 25 %
Lymphs Abs: 2.4 10*3/uL (ref 0.7–4.0)
MCH: 26.6 pg (ref 26.0–34.0)
MCHC: 33 g/dL (ref 30.0–36.0)
MCV: 80.8 fL (ref 80.0–100.0)
Monocytes Absolute: 0.4 10*3/uL (ref 0.1–1.0)
Monocytes Relative: 4 %
Neutro Abs: 6.6 10*3/uL (ref 1.7–7.7)
Neutrophils Relative %: 69 %
Platelet Count: 253 10*3/uL (ref 150–400)
RBC: 4.58 MIL/uL (ref 3.87–5.11)
RDW: 18.5 % — ABNORMAL HIGH (ref 11.5–15.5)
WBC Count: 9.6 10*3/uL (ref 4.0–10.5)
nRBC: 0.2 % (ref 0.0–0.2)

## 2021-05-14 LAB — CMP (CANCER CENTER ONLY)
ALT: 18 U/L (ref 0–44)
AST: 19 U/L (ref 15–41)
Albumin: 3.8 g/dL (ref 3.5–5.0)
Alkaline Phosphatase: 77 U/L (ref 38–126)
Anion gap: 7 (ref 5–15)
BUN: 8 mg/dL (ref 6–20)
CO2: 29 mmol/L (ref 22–32)
Calcium: 8.8 mg/dL — ABNORMAL LOW (ref 8.9–10.3)
Chloride: 106 mmol/L (ref 98–111)
Creatinine: 0.71 mg/dL (ref 0.44–1.00)
GFR, Estimated: >60 mL/min (ref >60)
Glucose, Bld: 136 mg/dL — ABNORMAL HIGH (ref 70–99)
Potassium: 3.5 mmol/L (ref 3.5–5.1)
Sodium: 142 mmol/L (ref 135–145)
Total Bilirubin: 0.5 mg/dL (ref 0.3–1.2)
Total Protein: 7.2 g/dL (ref 6.5–8.1)

## 2021-05-14 MED ORDER — OLAPARIB 150 MG PO TABS: 300.0000 mg | ORAL_TABLET | Freq: Two times a day (BID) | ORAL | 5 refills | Status: DC

## 2021-05-14 NOTE — Assessment & Plan Note (Addendum)
01/10/2020:Palpable right breast mass. Mammogram showed a 1.8cm mass at the 10:30 position 2cm from the nipple, a 1.5cm mass at the 10:30 position 7cm from the nipple, and indeterminate calcifications between the two masses in the right breast. Biopsy showed IDC with DCIS, grade 2, at both masses, HER-2 negative by FISH (ratio 1.77), ER/PR negative, Ki67 80%.? ?BRCA2?Mutation ?? ?Treatment plan: ?1. Neoadjuvant chemotherapy with Adriamycin and Cytoxan every?2?weeks??4 followed by Taxol weekly ?12 with carboplatin?and ?every 3 weeks x4?completed 07/05/2020 ?2.?08/08/20?breast conserving surgery with sentinel lymph node study : scattered foci of DCIS, margins Neg, 0/3 LN Neg ?3.?Adjuvant radiation therapy?11/22/20- 12/18/20 ?4.?Adjuvant olaparib started 02/20/21 ?[facility] nausea study ?? ?Breast MRI?01/14/2020: 2 cm spiculated mass UOQ right breast, 2.1 cm spiculated mass UOQ right breast, biopsy-proven malignancies. ?7 mm suspicious enhancing mass: Biopsy 02/02/2020: High-grade DCIS ?----------------------------------------------------------------------------------------------------------------------------------------------- ?Olaparib: Jacqueline Adams is a PARP inhibitor started 02/20/2021 (1 year of total therapy) ?? ?Jacqueline Adams trial showed that the recurrence rate improves with olaparib. ?86% versus 77%. ?Dose: 300 mg twice daily? ?Adverse effects:  ?1.  Very mild nausea ?2. fatigue ?? ?Breast cancer surveillance: Breast MRI will be done in August.  Mammograms were done in February ?she had prophylactic oophorectomy. ?  ?Return to clinic in 4 weeks with labs and follow-up with Jacqueline Adams.  After that we can see her every 2 months.  I will make an appointment for her in July. ?? ?

## 2021-05-15 ENCOUNTER — Telehealth: Payer: Self-pay | Admitting: Hematology and Oncology

## 2021-05-15 NOTE — Telephone Encounter (Signed)
Scheduled appointment per 4/3 los. Patient is aware. ?

## 2021-05-16 ENCOUNTER — Ambulatory Visit: Payer: 59 | Attending: Hematology and Oncology

## 2021-05-16 DIAGNOSIS — R293 Abnormal posture: Secondary | ICD-10-CM | POA: Diagnosis present

## 2021-05-16 DIAGNOSIS — M25611 Stiffness of right shoulder, not elsewhere classified: Secondary | ICD-10-CM | POA: Insufficient documentation

## 2021-05-16 DIAGNOSIS — Z171 Estrogen receptor negative status [ER-]: Secondary | ICD-10-CM | POA: Insufficient documentation

## 2021-05-16 DIAGNOSIS — C50411 Malignant neoplasm of upper-outer quadrant of right female breast: Secondary | ICD-10-CM | POA: Insufficient documentation

## 2021-05-16 DIAGNOSIS — Z483 Aftercare following surgery for neoplasm: Secondary | ICD-10-CM | POA: Insufficient documentation

## 2021-05-16 NOTE — Therapy (Signed)
?OUTPATIENT PHYSICAL THERAPY TREATMENT NOTE ? ? ?Patient Name: Jacqueline Adams ?MRN: [id number] ?DOB:[birth year], 61 y.o., female ?Today's Date: 05/16/2021 ? ?PCP: Robyne Peers, MD ?REFERRING PROVIDER:Vinay [name] MD ? ? PT End of Session - 05/16/21 7829   ? ? Visit Number 16   ? Number of Visits 23   ? Date for PT Re-Evaluation 06/06/21   ? Authorization - Visit Number 16   ? Authorization - Number of Visits 60   ? PT Start Time [phone]   ? PT Stop Time 0900   ? PT Time Calculation (min) 56 min   ? Activity Tolerance Patient tolerated treatment well   ? Behavior During Therapy [facility] for tasks assessed/performed   ? ?  ?  ? ?  ? ? ?Past Medical History:  ?Diagnosis Date  ? BRCA2 gene mutation positive in female   ? Breast cancer ([place]) 01/2020  ? right breast IDC  ? Bronchitis   ? Cervical spondylarthritis   ? Headache(784.0)   ? High cholesterol   ? Hypertension   ? Leukocytosis 2009  ? Multiple sclerosis ([place]) 2009  ? followed by Dr. Erling Cruz  ? Neuromuscular disorder ([place])   ? Pneumonia   ? Rhinitis   ? Seasonal allergies   ? Sinusitis   ? ?Past Surgical History:  ?Procedure Laterality Date  ? ABDOMINAL HYSTERECTOMY  2006  ? uterus, cervix, portion of fallopian tubes for AUB  ? BREAST LUMPECTOMY WITH RADIOACTIVE SEED AND SENTINEL LYMPH NODE BIOPSY Right 08/08/2020  ? Procedure: RIGHT BREAST LUMPECTOMY WITH RADIOACTIVE SEED X 3 AND SENTINEL LYMPH NODE BIOPSY;  Surgeon: Stark Klein, MD;  Location: [place];  Service: General;  Laterality: Right;  ? CESAREAN SECTION    ? x 3  ? CORRECTION HAMMER TOE Bilateral   ? PORT-A-CATH REMOVAL Right 09/21/2020  ? Procedure: REMOVAL PORT-A-CATH;  Surgeon: Stark Klein, MD;  Location: [place];  Service: General;  Laterality: Right;  ? PORTACATH PLACEMENT Right 02/22/2020  ? Procedure: RIGHT SUBCLAVIAN VEIN PORT PLACEMENT;  Surgeon: Donnie Mesa, MD;  Location: [place];  Service: General;  Laterality: Right;  ? ROBOTIC ASSISTED BILATERAL SALPINGO OOPHERECTOMY  Bilateral 01/30/2021  ? Procedure: XI ROBOTIC ASSISTED BILATERAL SALPINGO OOPHORECTOMY, LYSIS OF ADHESIONS, 1 HOUR;  Surgeon: Lafonda Mosses, MD;  Location: [facility];  Service: Gynecology;  Laterality: Bilateral;  ? ?Patient Active Problem List  ? Diagnosis Date Noted  ? Bilateral ovarian cysts   ? Port-A-Cath in place 04/05/2020  ? Malignant neoplasm of upper-outer quadrant of right breast in female, estrogen receptor negative ([place]) 01/10/2020  ? High risk medication use 03/25/2019  ? Microcytic anemia 12/31/2017  ? 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 UACS 12/16/2017  ? History of pneumonia 11/12/2017  ? Morbid obesity ([place]) 08/19/2016  ? Numbness 05/09/2015  ? Other fatigue 05/09/2015  ? Arthralgia of hip 11/16/2014  ? Low back pain with sciatica 11/16/2014  ? Urinary frequency 11/16/2014  ? Encounter for general adult medical examination without abnormal findings 06/07/2014  ? DOE (dyspnea on exertion) 04/27/2014  ? Atypical chest pain 04/27/2014  ? Dyslipidemia 04/27/2014  ? HLD (hyperlipidemia) 02/14/2014  ? Cervical spondylosis without myelopathy 07/28/2013  ? Cervical pain 07/28/2013  ? CFIDS (chronic fatigue and immune dysfunction syndrome) ([place]) 07/28/2013  ? Chronic infection of sinus 07/28/2013  ? Headache, migraine 07/28/2013  ? DS (disseminated sclerosis) ([place]) 07/28/2013  ? Spasms of the hands or feet 07/28/2013  ? DDD (degenerative disc disease), cervical 07/28/2013  ? Menopausal  symptom 07/28/2013  ? Taking drug for chronic disease 07/28/2013  ? Vitamin D deficiency 07/28/2013  ? Degeneration of intervertebral disc of cervical region 07/28/2013  ? Cough 04/06/2013  ? BRCA gene mutation positive in female 06/12/2011  ? Genetic susceptibility to malignant neoplasm of breast 06/12/2011  ? Multiple sclerosis ([place])   ? Leukocytosis   ? ? ?REFERRING DIAG: Right breast Cancer ? ?THERAPY DIAG:  ?Malignant neoplasm of upper-outer quadrant of right breast in female, estrogen receptor negative ([place]) ? ?Aftercare  following surgery for neoplasm ? ?Abnormal posture ? ?Stiffness of right shoulder, not elsewhere classified ? 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 She underwent neoadjuvant chemotherapy 02/23/2020 - 07/05/2020 followed by a right lumpectomy and sentinel node biopsy (3 negative nodes) on 08/08/2020. Completed radiation.  She has relapsing remitting multiple sclerosis since 2009 ? ?PRECAUTIONS: Right UE lymphedema risk ? ?SUBJECTIVE: The self MLD is really helping my breast feel better. I've also been doing the theraband exercises and those are going well.  ? ? ?PAIN: Are you having pain? No, but sometimes tender under the arm ? ?  ? TODAY'S TREATMENT  ?05/16/21: ?Therapist performed MLD to the Rt breast with pt permission: In supine: Short neck, 5 diaphragmatic breaths, Lt axillary nodes and establishment of anterior inter-axillary pathway, Rt inguinal nodes and establishment of Rt axillo-inguinal pathway, then R breast moving fluid towards pathways spending extra time in any areas of fibrosis, then into Lt S/L to further work to lateral aspect of breast redirecting to axillo-inguinal anastomosis, then retraced all steps in supine reviewing with pt while performing. ?STM to lateral breast with scar mobilization  ?PROM right shoulder with MFR to the axilla flexion, scaption and D2 to pts tolerance ? ?05/09/21 ?PT performed MLD to the Rt breast with pt permission and then had pt also practice: In supine: Short neck, 5 diaphragmatic breaths, bil axillary nodes and establishment of interaxillary pathway, R inguinal nodes and establishment of axilloinguinal pathway, then R breast moving fluid towards pathways spending extra time in any areas of fibrosis then retracing all steps. ?STM to lateral breast with scar mobilization and fibrosis deeper pressure  ?PROM right shoulder with MFR to the axilla flexion, scaption, abd,  ER ?supine scapular series x 5 each with yellow band x 1min ?Assessed goals and plan x 54min ? ?05/07/2021 ?PT performed MLD to the Rt breast with pt permission and then had pt also practice: In supine: Short neck, 5 diaphragmatic breaths, bil axillary nodes and establishment of interaxillary pathway, R inguinal nodes and establishment of axilloinguinal pathway, then R breast moving fluid towards pathways spending extra time in any areas of fibrosis then retracing all steps. ?STM to lateral breast with scar mobilization and fibrosis deeper pressure  ?PROM right shoulder with MFR to the axilla flexion, scaption, abd, ER ?Educated in supine scapular series x 5 with yellow band and pt given yellow band for home use ? ? ?PATIENT EDUCATION:  ?Education details: supine scapular series with yellow band ?Person educated: Patient ?Education method: Explanation, Demonstration, Tactile cues, Verbal cues, and Handouts ?Education comprehension: verbalized understanding, returned demonstration, verbal cues required, tactile cues required, and needs further education ?  ? HOME EXERCISE PROGRAM: ?Self MLD - see bottom of note, Supine scapular series with yellow band ?  ?ASSESSMENT: ? ?Right Upper Extremity Lymphedema    ?  ?09/07/20  05/09/21 ?10 cm Proximal to Olecranon Process ?34.5 cm   35 ?  ?  Olecranon Process ?29.1 cm   30 ?  ?10 cm Proximal to Ulnar Styloid Process ?24.8 cm   25 ?  ?Just Proximal to Ulnar Styloid Process ?17.3 cm   17.7 ?  ?Across Hand at [employer] ?20.6 cm   20 ?  ?At [place] of 2nd Digit ?6.6 cm   6.6 ? ?  ?CLINICAL IMPRESSION: ?Pt continues with fibrotic Rt breast that improves with PT sessions and MT. She also reports this has started to greatly improve with self MLD at home.  Pt will continue with visits as she sees so much improvement compared to self MLD though she is improving with this and further instruction seemed beneficial today in Lt S/L to help her better reach the lateral breast.  May be  ready for DC pending availability of flexitouch.   ? ?Pt continues with chronic lymphedema after at least 4 weeks of compression, elevation, and exercises provided with PT treatment and self management.  ? ?Pt demonstrates hype

## 2021-05-22 ENCOUNTER — Encounter (HOSPITAL_COMMUNITY): Payer: Self-pay

## 2021-05-23 ENCOUNTER — Encounter: Payer: 59 | Admitting: Adult Health

## 2021-05-27 NOTE — Progress Notes (Signed)
SURVIVORSHIP VISIT: ? ? ? ?BRIEF ONCOLOGIC HISTORY:  ?Oncology History  ?Malignant neoplasm of upper-outer quadrant of right breast in female, estrogen receptor negative ([facility])  ?04/12/2011 Genetic Testing  ? + BRCA 2 mutation on Q486X (1684C>T) BRCA2.  ?  ?01/10/2020 Initial Diagnosis  ? Palpable right breast mass. Mammogram showed a 1.8cm mass at the 10:30 position 2cm from the nipple, a 1.5cm mass at the 10:30 position 7cm from the nipple, and indeterminate calcifications between the two masses in the right breast. 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  ?  ?01/12/2020 Cancer Staging  ? Staging form: Breast, AJCC 8th Edition ?- Clinical stage from 01/12/2020: Stage IB (cT1c, cN0, cM0, G3, ER-, PR-, HER2-) - Signed by Gudena, Vinay, MD on 02/03/2020 ? ?  ?02/23/2020 - 07/05/2020 Chemotherapy  ? Patient is on Treatment Plan : BREAST Dose Dense AC q14d / CARBOplatin D1 + PACLitaxel D1,8,15 q21d  ? ?  ?  ?08/08/2020 Surgery  ? Right breast lumpectomy: scattered foci of DCIS with calcifications, intermediate to high-grade, largest measuring 0.3 cm, no residual invasive carcinoma, resection margins negative for DCIS, and all 3 axillary nodes negative for carcinoma ?  ?11/21/2020 - 12/18/2020 Radiation Therapy  ? 11/21/2020 through 12/18/2020 ?Site Technique Total Dose (Gy) Dose per Fx (Gy) Completed Fx Beam Energies  ?Breast, Right: Breast_Rt 3D 42.56/42.56 2.66 16/16 10X  ?Breast, Right: Breast_Rt_Bst 3D 8/8 2 4/4 6X, 10X  ?  ?03/04/2021 Miscellaneous  ? Olaparib started at 150mg twice daily ?Increased to 300mg BID 04/30/2021 ?  ? ? ?INTERVAL HISTORY:  ?Ms. Berkery to review her survivorship care plan detailing her treatment course for breast cancer, as well as monitoring long-term side effects of that treatment, education regarding health maintenance, screening, and overall wellness and health promotion.    ? ?Overall, Ms. Madry reports feeling  quite well.  She has continued on olaparib taken at 300 mg twice a day with good tolerance.  Her main issues with taking this medication include fatigue and occasional diarrhea.  Otherwise she has no significant concerns. ? ?Her most recent mammogram was completed on April 02, 2021.  It showed no evidence of malignancy, breast density category B.  Because of her BRCA2 positivity she was recommended to undergo annual MRI as she has been.  This has been ordered for August, 2023. ? ?Caroline continues to see Dr. Seiter in neurology for her multiple sclerosis.  He tells me she has been as active as she can be due to her MS and she takes brief walks around her house in the mornings. ? ?REVIEW OF SYSTEMS:  ?Review of Systems  ?Constitutional:  Positive for fatigue. Negative for appetite change, chills, fever and unexpected weight change.  ?HENT:   Negative for hearing loss, lump/mass and trouble swallowing.   ?Eyes:  Negative for eye problems and icterus.  ?Respiratory:  Negative for chest tightness, cough and shortness of breath.   ?Cardiovascular:  Negative for chest pain, leg swelling and palpitations.  ?Gastrointestinal:  Positive for diarrhea. Negative for abdominal distention, abdominal pain, constipation, nausea and vomiting.  ?Endocrine: Negative for hot flashes.  ?Genitourinary:  Negative for difficulty urinating.   ?Musculoskeletal:  Positive for arthralgias.  ?Skin:  Negative for itching and rash.  ?Neurological:  Negative for dizziness, extremity weakness, headaches and numbness.  ?Hematological:  Negative for adenopathy. Does not bruise/bleed easily.  ?Psychiatric/Behavioral:  Negative for depression. The patient is not nervous/anxious.   ?  Breast: Denies any new nodularity, masses, tenderness, nipple changes, or nipple discharge.  ? ? ? ? ?ONCOLOGY TREATMENT TEAM:  ?1. Surgeon:  Dr. Tsuei at [facility] ?2. Medical Oncologist: Dr. Gudena  ?3. Radiation Oncologist: Dr. Moody ?  ? ?PAST  MEDICAL/SURGICAL HISTORY:  ?Past Medical History:  ?Diagnosis Date  ? BRCA2 gene mutation positive in female   ? Breast cancer ([facility]) 01/2020  ? right breast IDC  ? Bronchitis   ? Cervical spondylarthritis   ? Headache(784.0)   ? High cholesterol   ? Hypertension   ? Leukocytosis 2009  ? Multiple sclerosis ([facility]) 2009  ? followed by Dr. Love  ? Neuromuscular disorder ([facility])   ? Pneumonia   ? Rhinitis   ? Seasonal allergies   ? Sinusitis   ? ?Past Surgical History:  ?Procedure Laterality Date  ? ABDOMINAL HYSTERECTOMY  2006  ? uterus, cervix, portion of fallopian tubes for AUB  ? BREAST LUMPECTOMY WITH RADIOACTIVE SEED AND SENTINEL LYMPH NODE BIOPSY Right 08/08/2020  ? Procedure: RIGHT BREAST LUMPECTOMY WITH RADIOACTIVE SEED X 3 AND SENTINEL LYMPH NODE BIOPSY;  Surgeon: Byerly, Faera, MD;  Location: [facility];  Service: General;  Laterality: Right;  ? CESAREAN SECTION    ? x 3  ? CORRECTION HAMMER TOE Bilateral   ? PORT-A-CATH REMOVAL Right 09/21/2020  ? Procedure: REMOVAL PORT-A-CATH;  Surgeon: Byerly, Faera, MD;  Location: [facility];  Service: General;  Laterality: Right;  ? PORTACATH PLACEMENT Right 02/22/2020  ? Procedure: RIGHT SUBCLAVIAN VEIN PORT PLACEMENT;  Surgeon: Tsuei, Matthew, MD;  Location: [facility];  Service: General;  Laterality: Right;  ? ROBOTIC ASSISTED BILATERAL SALPINGO OOPHERECTOMY Bilateral 01/30/2021  ? Procedure: XI ROBOTIC ASSISTED BILATERAL SALPINGO OOPHORECTOMY, LYSIS OF ADHESIONS, 1 HOUR;  Surgeon: Tucker, Katherine R, MD;  Location: [facility];  Service: Gynecology;  Laterality: Bilateral;  ? ? ? ?ALLERGIES:  ?Allergies  ?Allergen Reactions  ? Nabumetone Swelling and Hives  ? Procaine Swelling  ? Shellfish Allergy Anaphylaxis  ? ? ? ?CURRENT MEDICATIONS:  ?Outpatient Encounter Medications as of 05/28/2021  ?Medication Sig  ? AVONEX PREFILLED 30 MCG/0.5ML PSKT injection INJECT ONE SYRINGE INTRAMUSCULARLY ONCE WEEKLY. REFRIGERATE. PROTECT FROM LIGHT. ALLOW TO WARM TO ROOM TEMPERATURE  PRIOR TO USE.  ? Calcium Carb-Cholecalciferol (CALCIUM 600+D3 PO) Take 1 tablet by mouth in the morning and at bedtime.  ? ELDERBERRY PO Take by mouth. Patient report taking 2 gummies daily  ? Ferrous Sulfate Dried (SLOW RELEASE IRON) 45 MG TBCR Take 45 mg by mouth in the morning.  ? fexofenadine (ALLEGRA) 180 MG tablet Take 180 mg by mouth daily as needed for allergies.  ? fluticasone (FLONASE) 50 MCG/ACT nasal spray Place 2 sprays into both nostrils daily. (Patient taking differently: Place 2 sprays into both nostrils daily as needed for allergies.)  ? ibuprofen (ADVIL) 200 MG tablet Take 400 mg by mouth every Friday. W/Avonex injection  ? KLOR-CON M20 20 MEQ tablet TAKE 1 TABLET BY MOUTH EVERY DAY IN THE MORNING  ? meclizine (ANTIVERT) 25 MG tablet Take 1 tablet (25 mg total) by mouth 3 (three) times daily as needed for dizziness.  ? metoprolol succinate (TOPROL-XL) 25 MG 24 hr tablet Take 25 mg by mouth daily.  ? Multiple Vitamin (MULTIVITAMIN WITH MINERALS) TABS tablet Take 1 tablet by mouth every evening. Centrum  ? olaparib (LYNPARZA) 150 MG tablet Take 2 tablets (300 mg total) by mouth 2 (two) times daily. Swallow whole. May take with food to decrease   nausea and vomiting.  ? Omega-3 Fatty Acids (FISH OIL) 1000 MG CPDR Take 1,000 mg by mouth daily.  ? simvastatin (ZOCOR) 20 MG tablet Take 20 mg by mouth every evening.  ? VENTOLIN HFA 108 (90 BASE) MCG/ACT inhaler Inhale 1-2 puffs into the lungs every 6 (six) hours as needed for shortness of breath or wheezing.  ? [DISCONTINUED] prochlorperazine (COMPAZINE) 10 MG tablet Take 1 tablet (10 mg total) by mouth every 6 (six) hours as needed (Nausea or vomiting). (Patient not taking: Reported on 03/06/2021)  ? ?No facility-administered encounter medications on file as of 05/28/2021.  ? ? ? ?ONCOLOGIC FAMILY HISTORY:  ?Family History  ?Problem Relation Age of Onset  ? Cancer Mother 21  ?     ovarian cancer; now with suspected breast cancer  ? Diabetes Mother   ?      also HTN  ? Breast cancer Mother   ? Clotting disorder Father   ? Prostate cancer Father   ? Colon cancer Sister 37  ?     anal cancer (2014)  ? Lung cancer Sister 74  ? Stroke Sister 25  ? Ovarian cancer Sister 66  ?

## 2021-05-28 ENCOUNTER — Encounter: Payer: Self-pay | Admitting: Adult Health

## 2021-05-28 ENCOUNTER — Other Ambulatory Visit: Payer: Self-pay

## 2021-05-28 ENCOUNTER — Other Ambulatory Visit (HOSPITAL_COMMUNITY): Payer: Self-pay

## 2021-05-28 ENCOUNTER — Inpatient Hospital Stay (HOSPITAL_BASED_OUTPATIENT_CLINIC_OR_DEPARTMENT_OTHER): Payer: 59 | Admitting: Adult Health

## 2021-05-28 VITALS — BP 128/70 | HR 92 | Temp 97.8°F | Resp 18 | Ht 64.0 in | Wt 229.6 lb

## 2021-05-28 DIAGNOSIS — Z853 Personal history of malignant neoplasm of breast: Secondary | ICD-10-CM | POA: Diagnosis not present

## 2021-05-28 DIAGNOSIS — E2839 Other primary ovarian failure: Secondary | ICD-10-CM | POA: Diagnosis not present

## 2021-05-28 DIAGNOSIS — Z171 Estrogen receptor negative status [ER-]: Secondary | ICD-10-CM

## 2021-05-29 ENCOUNTER — Ambulatory Visit: Payer: 59 | Admitting: Rehabilitation

## 2021-05-29 ENCOUNTER — Encounter: Payer: Self-pay | Admitting: Rehabilitation

## 2021-05-29 DIAGNOSIS — Z171 Estrogen receptor negative status [ER-]: Secondary | ICD-10-CM

## 2021-05-29 DIAGNOSIS — R293 Abnormal posture: Secondary | ICD-10-CM

## 2021-05-29 DIAGNOSIS — M25611 Stiffness of right shoulder, not elsewhere classified: Secondary | ICD-10-CM

## 2021-05-29 DIAGNOSIS — Z483 Aftercare following surgery for neoplasm: Secondary | ICD-10-CM

## 2021-05-29 DIAGNOSIS — C50411 Malignant neoplasm of upper-outer quadrant of right female breast: Secondary | ICD-10-CM | POA: Diagnosis not present

## 2021-05-29 NOTE — Therapy (Signed)
?OUTPATIENT PHYSICAL THERAPY TREATMENT NOTE ? ? ?Patient Name: Jacqueline Adams ?MRN: [id number] ?DOB:[birth year], 61 y.o., female ?Today's Date: 05/29/2021 ? ?PCP: Robyne Peers, MD ?REFERRING PROVIDER:Vinay [name] MD ? ? PT End of Session - 05/29/21 0903   ? ? Visit Number 17   ? Number of Visits 23   ? Date for PT Re-Evaluation 06/06/21   ? Authorization - Visit Number 17   ? PT Start Time [id number]   ? PT Stop Time 0947   ? PT Time Calculation (min) 43 min   ? Activity Tolerance Patient tolerated treatment well   ? Behavior During Therapy [facility] for tasks assessed/performed   ? ?  ?  ? ?  ? ? ?Past Medical History:  ?Diagnosis Date  ? BRCA2 gene mutation positive in female   ? Breast cancer ([place]) 01/2020  ? right breast IDC  ? Bronchitis   ? Cervical spondylarthritis   ? Headache(784.0)   ? High cholesterol   ? Hypertension   ? Leukocytosis 2009  ? Multiple sclerosis ([place]) 2009  ? followed by Dr. Erling Cruz  ? Neuromuscular disorder ([place])   ? Pneumonia   ? Rhinitis   ? Seasonal allergies   ? Sinusitis   ? ?Past Surgical History:  ?Procedure Laterality Date  ? ABDOMINAL HYSTERECTOMY  2006  ? uterus, cervix, portion of fallopian tubes for AUB  ? BREAST LUMPECTOMY WITH RADIOACTIVE SEED AND SENTINEL LYMPH NODE BIOPSY Right 08/08/2020  ? Procedure: RIGHT BREAST LUMPECTOMY WITH RADIOACTIVE SEED X 3 AND SENTINEL LYMPH NODE BIOPSY;  Surgeon: Stark Klein, MD;  Location: [place];  Service: General;  Laterality: Right;  ? CESAREAN SECTION    ? x 3  ? CORRECTION HAMMER TOE Bilateral   ? PORT-A-CATH REMOVAL Right 09/21/2020  ? Procedure: REMOVAL PORT-A-CATH;  Surgeon: Stark Klein, MD;  Location: [place];  Service: General;  Laterality: Right;  ? PORTACATH PLACEMENT Right 02/22/2020  ? Procedure: RIGHT SUBCLAVIAN VEIN PORT PLACEMENT;  Surgeon: Donnie Mesa, MD;  Location: [place];  Service: General;  Laterality: Right;  ? ROBOTIC ASSISTED BILATERAL SALPINGO OOPHERECTOMY Bilateral 01/30/2021  ? Procedure: XI ROBOTIC  ASSISTED BILATERAL SALPINGO OOPHORECTOMY, LYSIS OF ADHESIONS, 1 HOUR;  Surgeon: Lafonda Mosses, MD;  Location: [facility];  Service: Gynecology;  Laterality: Bilateral;  ? ?Patient Active Problem List  ? Diagnosis Date Noted  ? Bilateral ovarian cysts   ? Malignant neoplasm of upper-outer quadrant of right breast in female, estrogen receptor negative ([place]) 01/10/2020  ? High risk medication use 03/25/2019  ? Microcytic anemia 12/31/2017  ? 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 UACS 12/16/2017  ? History of pneumonia 11/12/2017  ? Morbid obesity ([place]) 08/19/2016  ? Numbness 05/09/2015  ? Other fatigue 05/09/2015  ? Arthralgia of hip 11/16/2014  ? Low back pain with sciatica 11/16/2014  ? Urinary frequency 11/16/2014  ? Encounter for general adult medical examination without abnormal findings 06/07/2014  ? DOE (dyspnea on exertion) 04/27/2014  ? Atypical chest pain 04/27/2014  ? Dyslipidemia 04/27/2014  ? HLD (hyperlipidemia) 02/14/2014  ? Cervical spondylosis without myelopathy 07/28/2013  ? Cervical pain 07/28/2013  ? CFIDS (chronic fatigue and immune dysfunction syndrome) ([place]) 07/28/2013  ? Chronic infection of sinus 07/28/2013  ? Headache, migraine 07/28/2013  ? 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 Spasms of the hands or feet 07/28/2013  ? DDD (degenerative disc disease), cervical 07/28/2013  ? Menopausal symptom 07/28/2013  ? Taking drug for chronic disease 07/28/2013  ? Vitamin D deficiency  07/28/2013  ? Degeneration of intervertebral disc of cervical region 07/28/2013  ? Cough 04/06/2013  ? BRCA gene mutation positive in female 06/12/2011  ? Genetic susceptibility to malignant neoplasm of breast 06/12/2011  ? Multiple sclerosis ([place])   ? Leukocytosis   ? ? ?REFERRING DIAG: Right breast Cancer ? ?THERAPY DIAG:  ?Malignant neoplasm of upper-outer quadrant of right breast in female, estrogen receptor negative ([place]) ? ?Abnormal posture ? ?Stiffness of right shoulder, not elsewhere classified ? ?Aftercare following  surgery for neoplasm ? 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 She underwent neoadjuvant chemotherapy 02/23/2020 - 07/05/2020 followed by a right lumpectomy and sentinel node biopsy (3 negative nodes) on 08/08/2020. Completed radiation.  She has relapsing remitting multiple sclerosis since 2009 ? ?PRECAUTIONS: Right UE lymphedema risk ? ?SUBJECTIVE: I am just tired  ? ?PAIN: Are you having pain? No, but sometimes tender under the arm ? ?  ? TODAY'S TREATMENT  ?05/29/21 ?PT performed MLD to the Rt breast with pt permission and then had pt also practice: In supine: Short neck, 5 diaphragmatic breaths, bil axillary nodes and establishment of interaxillary pathway, R inguinal nodes and establishment of axilloinguinal pathway, then R breast moving fluid towards pathways spending extra time in any areas of fibrosis then retracing all steps. ?STM to lateral breast with scar mobilization and fibrosis deeper pressure  ?PROM right shoulder with MFR to the axilla flexion, scaption, abd, ER ? ?05/16/21: ?Therapist performed MLD to the Rt breast with pt permission: In supine: Short neck, 5 diaphragmatic breaths, Lt axillary nodes and establishment of anterior inter-axillary pathway, Rt inguinal nodes and establishment of Rt axillo-inguinal pathway, then R breast moving fluid towards pathways spending extra time in any areas of fibrosis, then into Lt S/L to further work to lateral aspect of breast redirecting to axillo-inguinal anastomosis, then retraced all steps in supine reviewing with pt while performing. ?STM to lateral breast with scar mobilization  ?PROM right shoulder with MFR to the axilla flexion, scaption and D2 to pts tolerance ? ?05/09/21 ?PT performed MLD to the Rt breast with pt permission and then had pt also practice: In supine: Short neck, 5 diaphragmatic breaths, bil axillary nodes and establishment of interaxillary  pathway, R inguinal nodes and establishment of axilloinguinal pathway, then R breast moving fluid towards pathways spending extra time in any areas of fibrosis then retracing all steps. ?STM to lateral breast with scar mobilization and fibrosis deeper pressure  ?PROM right shoulder with MFR to the axilla flexion, scaption, abd, ER ?supine scapular series x 5 each with yellow band x 5mn ?Assessed goals and plan x 173m ? ?PATIENT EDUCATION:  ?Education details: supine scapular series with yellow band ?Person educated: Patient ?Education method: Explanation, Demonstration, Tactile cues, Verbal cues, and Handouts ?Education comprehension: verbalized understanding, returned demonstration, verbal cues required, tactile cues required, and needs further education ?  ? HOME EXERCISE PROGRAM: ?Self MLD - see bottom of note, Supine scapular series with yellow band ?  ?ASSESSMENT: ? ?Right Upper Extremity Lymphedema    ?  ?09/07/20  05/09/21 ?10 cm Proximal to Olecranon Process ?34.5 cm   35 ?  ?Olecranon Process ?29.1 cm   30 ?  ?10 cm Proximal to Ulnar Styloid Process ?24.8 cm   25 ?  ?Just Proximal to Ulnar Styloid Process ?17.3 cm   17.7 ?  ?Across Hand at [employer]20.6 cm   20 ?  ?At [place]  2nd Digit ?6.6 cm   6.6 ? ?  ?CLINICAL IMPRESSION: ?Pt continues with fibrotic Rt breast that improves with PT sessions and MT.  May be ready for DC pending availability of flexitouch.   ? ?OBJECTIVE IMPAIRMENTS increased fascial restrictions and impaired sensation.  ?  ?ACTIVITY LIMITATIONS  none .  ?PERSONAL FACTORS Fitness and 1-2 comorbidities: SLNB, radiation  are also affecting patient's functional outcome.  ?  ?  ?REHAB POTENTIAL: Excellent ?  ?CLINICAL DECISION MAKING: Stable/uncomplicated ?  ?EVALUATION COMPLEXITY: Low ?  ?GOALS: ?Goals reviewed with patient? Yes ?  ?LONG TERM GOALS: ?  ?Pt will be ind with self MLD for the Rt breast ?Baseline: needs reeducation ?Target date: 05/25/2021 ?Goal status: MET ?  ?2.  Pt  will be ind with home stretches post radiation to decrease stiffness ?Baseline: no HEP ?Target date: 05/25/21    ?Goal status: MET ?  ?PLAN: ?PT FREQUENCY: 2x/week ?  ?PT DURATION: 6 weeks ?  ?PLANNED INTERVENTI

## 2021-05-31 ENCOUNTER — Other Ambulatory Visit: Payer: Self-pay | Admitting: Hematology and Oncology

## 2021-05-31 ENCOUNTER — Ambulatory Visit: Payer: 59 | Admitting: Rehabilitation

## 2021-05-31 ENCOUNTER — Encounter: Payer: Self-pay | Admitting: Rehabilitation

## 2021-05-31 DIAGNOSIS — Z171 Estrogen receptor negative status [ER-]: Secondary | ICD-10-CM

## 2021-05-31 DIAGNOSIS — C50411 Malignant neoplasm of upper-outer quadrant of right female breast: Secondary | ICD-10-CM | POA: Diagnosis not present

## 2021-05-31 DIAGNOSIS — R293 Abnormal posture: Secondary | ICD-10-CM

## 2021-05-31 DIAGNOSIS — M25611 Stiffness of right shoulder, not elsewhere classified: Secondary | ICD-10-CM

## 2021-05-31 DIAGNOSIS — Z483 Aftercare following surgery for neoplasm: Secondary | ICD-10-CM

## 2021-05-31 NOTE — Therapy (Signed)
?OUTPATIENT PHYSICAL THERAPY TREATMENT NOTE ? ? ?Patient Name: Jacqueline Adams ?MRN: [id number] ?DOB:[birth year], 61 y.o., female ?Today's Date: 05/31/2021 ? ?PCP: Robyne Peers, MD ?REFERRING PROVIDER:Vinay [name] MD ? ? PT End of Session - 05/31/21 0935   ? ? Visit Number 18   ? Number of Visits 23   ? Date for PT Re-Evaluation 06/06/21   ? Authorization - Visit Number 18   ? Authorization - Number of Visits 60   ? PT Start Time [phone]   ? PT Stop Time 6629   ? PT Time Calculation (min) 44 min   ? Activity Tolerance Patient tolerated treatment well   ? Behavior During Therapy [facility] for tasks assessed/performed   ? ?  ?  ? ?  ? ? ? ?Past Medical History:  ?Diagnosis Date  ? BRCA2 gene mutation positive in female   ? Breast cancer ([place]) 01/2020  ? right breast IDC  ? Bronchitis   ? Cervical spondylarthritis   ? Headache(784.0)   ? High cholesterol   ? Hypertension   ? Leukocytosis 2009  ? Multiple sclerosis ([place]) 2009  ? followed by Dr. Erling Cruz  ? Neuromuscular disorder ([place])   ? Pneumonia   ? Rhinitis   ? Seasonal allergies   ? Sinusitis   ? ?Past Surgical History:  ?Procedure Laterality Date  ? ABDOMINAL HYSTERECTOMY  2006  ? uterus, cervix, portion of fallopian tubes for AUB  ? BREAST LUMPECTOMY WITH RADIOACTIVE SEED AND SENTINEL LYMPH NODE BIOPSY Right 08/08/2020  ? Procedure: RIGHT BREAST LUMPECTOMY WITH RADIOACTIVE SEED X 3 AND SENTINEL LYMPH NODE BIOPSY;  Surgeon: Stark Klein, MD;  Location: [place];  Service: General;  Laterality: Right;  ? CESAREAN SECTION    ? x 3  ? CORRECTION HAMMER TOE Bilateral   ? PORT-A-CATH REMOVAL Right 09/21/2020  ? Procedure: REMOVAL PORT-A-CATH;  Surgeon: Stark Klein, MD;  Location: [place];  Service: General;  Laterality: Right;  ? PORTACATH PLACEMENT Right 02/22/2020  ? Procedure: RIGHT SUBCLAVIAN VEIN PORT PLACEMENT;  Surgeon: Donnie Mesa, MD;  Location: [place];  Service: General;  Laterality: Right;  ? ROBOTIC ASSISTED BILATERAL SALPINGO OOPHERECTOMY  Bilateral 01/30/2021  ? Procedure: XI ROBOTIC ASSISTED BILATERAL SALPINGO OOPHORECTOMY, LYSIS OF ADHESIONS, 1 HOUR;  Surgeon: Lafonda Mosses, MD;  [facility];  Service: Gynecology;  Laterality: Bilateral;  ? ?Patient Active Problem List  ? Diagnosis Date Noted  ? Bilateral ovarian cysts   ? Malignant neoplasm of upper-outer quadrant of right breast in female, estrogen receptor negative ([place]) 01/10/2020  ? High risk medication use 03/25/2019  ? Microcytic anemia 12/31/2017  ? 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 UACS 12/16/2017  ? History of pneumonia 11/12/2017  ? Morbid obesity ([place]) 08/19/2016  ? Numbness 05/09/2015  ? Other fatigue 05/09/2015  ? Arthralgia of hip 11/16/2014  ? Low back pain with sciatica 11/16/2014  ? Urinary frequency 11/16/2014  ? Encounter for general adult medical examination without abnormal findings 06/07/2014  ? DOE (dyspnea on exertion) 04/27/2014  ? Atypical chest pain 04/27/2014  ? Dyslipidemia 04/27/2014  ? HLD (hyperlipidemia) 02/14/2014  ? Cervical spondylosis without myelopathy 07/28/2013  ? Cervical pain 07/28/2013  ? CFIDS (chronic fatigue and immune dysfunction syndrome) ([place]) 07/28/2013  ? Chronic infection of sinus 07/28/2013  ? Headache, migraine 07/28/2013  ? 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 Spasms of the hands or feet 07/28/2013  ? DDD (degenerative disc disease), cervical 07/28/2013  ? Menopausal symptom 07/28/2013  ? Taking  drug for chronic disease 07/28/2013  ? Vitamin D deficiency 07/28/2013  ? Degeneration of intervertebral disc of cervical region 07/28/2013  ? Cough 04/06/2013  ? BRCA gene mutation positive in female 06/12/2011  ? Genetic susceptibility to malignant neoplasm of breast 06/12/2011  ? Multiple sclerosis ([place])   ? Leukocytosis   ? ? ?REFERRING DIAG: Right breast Cancer ? ?THERAPY DIAG:  ?Malignant neoplasm of upper-outer quadrant of right breast in female, estrogen receptor negative ([place]) ? ?Abnormal posture ? ?Stiffness of right shoulder, not  elsewhere classified ? ?Aftercare following surgery for neoplasm ? 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 She underwent neoadjuvant chemotherapy 02/23/2020 - 07/05/2020 followed by a right lumpectomy and sentinel node biopsy (3 negative nodes) on 08/08/2020. Completed radiation.  She has relapsing remitting multiple sclerosis since 2009 ? ?PRECAUTIONS: Right UE lymphedema risk ? ?SUBJECTIVE: No sig c/o ? ?PAIN: Are you having pain? No, but sometimes tender under the arm ? ?  ? TODAY'S TREATMENT  ?05/31/21 ?PT performed MLD to the Rt breast with pt permission: In supine: Short neck, 5 diaphragmatic breaths, bil axillary nodes and establishment of interaxillary pathway, R inguinal nodes and establishment of axilloinguinal pathway, then R breast moving fluid towards pathways spending extra time in any areas of fibrosis then retracing all steps. ?STM to lateral breast with scar mobilization and fibrosis deeper pressure  ?PROM right shoulder with MFR to the axilla flexion, scaption, abd, ER ? ?05/29/21 ?PT performed MLD to the Rt breast with pt permission and then had pt also practice: In supine: Short neck, 5 diaphragmatic breaths, bil axillary nodes and establishment of interaxillary pathway, R inguinal nodes and establishment of axilloinguinal pathway, then R breast moving fluid towards pathways spending extra time in any areas of fibrosis then retracing all steps. ?STM to lateral breast with scar mobilization and fibrosis deeper pressure  ?PROM right shoulder with MFR to the axilla flexion, scaption, abd, ER ? ?05/16/21: ?Therapist performed MLD to the Rt breast with pt permission: In supine: Short neck, 5 diaphragmatic breaths, Lt axillary nodes and establishment of anterior inter-axillary pathway, Rt inguinal nodes and establishment of Rt axillo-inguinal pathway, then R breast moving fluid towards pathways spending extra time  in any areas of fibrosis, then into Lt S/L to further work to lateral aspect of breast redirecting to axillo-inguinal anastomosis, then retraced all steps in supine reviewing with pt while performing. ?STM to lateral breast with scar mobilization  ?PROM right shoulder with MFR to the axilla flexion, scaption and D2 to pts tolerance ? ? ?PATIENT EDUCATION:  ?Education details: supine scapular series with yellow band ?Person educated: Patient ?Education method: Explanation, Demonstration, Tactile cues, Verbal cues, and Handouts ?Education comprehension: verbalized understanding, returned demonstration, verbal cues required, tactile cues required, and needs further education ?  ? HOME EXERCISE PROGRAM: ?Self MLD - see bottom of note, Supine scapular series with yellow band ?  ?ASSESSMENT: ? ?Right Upper Extremity Lymphedema    ?  ?09/07/20  05/09/21 ?10 cm Proximal to Olecranon Process ?34.5 cm   35 ?  ?Olecranon Process ?29.1 cm   30 ?  ?10 cm Proximal to Ulnar Styloid Process ?24.8 cm   25 ?  ?Just Proximal to Ulnar Styloid Process ?17.3 cm   17.7 ?  ?Across Hand at [employer] ?20.6 cm   20 ?  ?At [place] of 2nd Digit ?6.6 cm   6.6 ? ?  ?CLINICAL IMPRESSION: ?Pt continues  with fibrotic Rt breast that improves with PT sessions and MT.  May be ready for DC pending availability of flexitouch.   ? ?OBJECTIVE IMPAIRMENTS increased fascial restrictions and impaired sensation.  ?  ?ACTIVITY LIMITATIONS  none .  ?PERSONAL FACTORS Fitness and 1-2 comorbidities: SLNB, radiation  are also affecting patient's functional outcome.  ?  ?  ?REHAB POTENTIAL: Excellent ?  ?CLINICAL DECISION MAKING: Stable/uncomplicated ?  ?EVALUATION COMPLEXITY: Low ?  ?GOALS: ?Goals reviewed with patient? Yes ?  ?LONG TERM GOALS: ?  ?Pt will be ind with self MLD for the Rt breast ?Baseline: needs reeducation ?Target date: 05/25/2021 ?Goal status: MET ?  ?2.  Pt will be ind with home stretches post radiation to decrease stiffness ?Baseline: no  HEP ?Target date: 05/25/21    ?Goal status: MET ?  ?PLAN: ?PT FREQUENCY: 2x/week ?  ?PT DURATION: 6 weeks ?  ?PLANNED INTERVENTIONS: Therapeutic exercises, Patient/Family education, Joint mobilization, Manual ly

## 2021-06-05 ENCOUNTER — Encounter: Payer: Self-pay | Admitting: Rehabilitation

## 2021-06-05 ENCOUNTER — Ambulatory Visit: Payer: 59 | Admitting: Rehabilitation

## 2021-06-05 DIAGNOSIS — C50411 Malignant neoplasm of upper-outer quadrant of right female breast: Secondary | ICD-10-CM | POA: Diagnosis not present

## 2021-06-05 DIAGNOSIS — Z483 Aftercare following surgery for neoplasm: Secondary | ICD-10-CM

## 2021-06-05 DIAGNOSIS — R293 Abnormal posture: Secondary | ICD-10-CM

## 2021-06-05 DIAGNOSIS — M25611 Stiffness of right shoulder, not elsewhere classified: Secondary | ICD-10-CM

## 2021-06-05 NOTE — Therapy (Signed)
?OUTPATIENT PHYSICAL THERAPY TREATMENT NOTE ? ? ?Patient Name: Jacqueline Adams ?MRN: [id number] ?DOB:1960-10-12, 61 y.o., female ?Today's Date: 06/05/2021 ? ?PCP: Robyne Peers, MD ?REFERRING PROVIDER:Vinay [name] MD ? ? PT End of Session - 06/05/21 0954   ? ? Visit Number 19   ? Number of Visits 23   ? Date for PT Re-Evaluation 06/06/21   ? PT Start Time 0900   ? PT Stop Time [phone]   ? PT Time Calculation (min) 48 min   ? Activity Tolerance Patient tolerated treatment well   ? Behavior During Therapy [facility] for tasks assessed/performed   ? ?  ?  ? ?  ? ? ? ? ?Past Medical History:  ?Diagnosis Date  ? BRCA2 gene mutation positive in female   ? Breast cancer ([place]) 01/2020  ? right breast IDC  ? Bronchitis   ? Cervical spondylarthritis   ? Headache(784.0)   ? High cholesterol   ? Hypertension   ? Leukocytosis 2009  ? Multiple sclerosis ([place]) 2009  ? followed by Dr. Erling Cruz  ? Neuromuscular disorder ([place])   ? Pneumonia   ? Rhinitis   ? Seasonal allergies   ? Sinusitis   ? ?Past Surgical History:  ?Procedure Laterality Date  ? ABDOMINAL HYSTERECTOMY  2006  ? uterus, cervix, portion of fallopian tubes for AUB  ? BREAST LUMPECTOMY WITH RADIOACTIVE SEED AND SENTINEL LYMPH NODE BIOPSY Right 08/08/2020  ? Procedure: RIGHT BREAST LUMPECTOMY WITH RADIOACTIVE SEED X 3 AND SENTINEL LYMPH NODE BIOPSY;  Surgeon: Stark Klein, MD;  Location: [place];  Service: General;  Laterality: Right;  ? CESAREAN SECTION    ? x 3  ? CORRECTION HAMMER TOE Bilateral   ? PORT-A-CATH REMOVAL Right 09/21/2020  ? Procedure: REMOVAL PORT-A-CATH;  Surgeon: Stark Klein, MD;  Location: [place];  Service: General;  Laterality: Right;  ? PORTACATH PLACEMENT Right 02/22/2020  ? Procedure: RIGHT SUBCLAVIAN VEIN PORT PLACEMENT;  Surgeon: Donnie Mesa, MD;  Location: [place];  Service: General;  Laterality: Right;  ? ROBOTIC ASSISTED BILATERAL SALPINGO OOPHERECTOMY Bilateral 01/30/2021  ? Procedure: XI ROBOTIC ASSISTED BILATERAL SALPINGO  OOPHORECTOMY, LYSIS OF ADHESIONS, 1 HOUR;  Surgeon: Lafonda Mosses, MD;  Location: [facility];  Service: Gynecology;  Laterality: Bilateral;  ? ?Patient Active Problem List  ? Diagnosis Date Noted  ? Bilateral ovarian cysts   ? Malignant neoplasm of upper-outer quadrant of right breast in female, estrogen receptor negative ([place]) 01/10/2020  ? High risk medication use 03/25/2019  ? Microcytic anemia 12/31/2017  ? 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 UACS 12/16/2017  ? History of pneumonia 11/12/2017  ? Morbid obesity ([place]) 08/19/2016  ? Numbness 05/09/2015  ? Other fatigue 05/09/2015  ? Arthralgia of hip 11/16/2014  ? Low back pain with sciatica 11/16/2014  ? Urinary frequency 11/16/2014  ? Encounter for general adult medical examination without abnormal findings 06/07/2014  ? DOE (dyspnea on exertion) 04/27/2014  ? Atypical chest pain 04/27/2014  ? Dyslipidemia 04/27/2014  ? HLD (hyperlipidemia) 02/14/2014  ? Cervical spondylosis without myelopathy 07/28/2013  ? Cervical pain 07/28/2013  ? CFIDS (chronic fatigue and immune dysfunction syndrome) ([place]) 07/28/2013  ? Chronic infection of sinus 07/28/2013  ? Headache, migraine 07/28/2013  ? 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 Spasms of the hands or feet 07/28/2013  ? DDD (degenerative disc disease), cervical 07/28/2013  ? Menopausal symptom 07/28/2013  ? Taking drug for chronic disease 07/28/2013  ? Vitamin D deficiency 07/28/2013  ? Degeneration of intervertebral  disc of cervical region 07/28/2013  ? Cough 04/06/2013  ? BRCA gene mutation positive in female 06/12/2011  ? Genetic susceptibility to malignant neoplasm of breast 06/12/2011  ? Multiple sclerosis ([place])   ? Leukocytosis   ? ? ?REFERRING DIAG: Right breast Cancer ? ?THERAPY DIAG:  ?Malignant neoplasm of upper-outer quadrant of right breast in female, estrogen receptor negative ([place]) ? ?Abnormal posture ? ?Stiffness of right shoulder, not elsewhere classified ? ?Aftercare following surgery for  neoplasm ? 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 She underwent neoadjuvant chemotherapy 02/23/2020 - 07/05/2020 followed by a right lumpectomy and sentinel node biopsy (3 negative nodes) on 08/08/2020. Completed radiation.  She has relapsing remitting multiple sclerosis since 2009 ? ?PRECAUTIONS: Right UE lymphedema risk ? ?SUBJECTIVE: No sig c/o ? ?PAIN: Are you having pain? No, but sometimes tender under the arm ? ?  ? TODAY'S TREATMENT  ?06/05/21 ?Family medicine resident present with pt permission today ?PT performed MLD to the Rt breast with pt permission: In supine: Short neck, 5 diaphragmatic breaths, bil axillary nodes and establishment of interaxillary pathway, R inguinal nodes and establishment of axilloinguinal pathway, then R breast moving fluid towards pathways spending extra time in any areas of fibrosis then retracing all steps. ?STM to lateral breast with scar mobilization and fibrosis deeper pressure  ?PROM right shoulder with MFR to the axilla flexion, scaption, abd, ER ? ?05/31/21 ?PT performed MLD to the Rt breast with pt permission: In supine: Short neck, 5 diaphragmatic breaths, bil axillary nodes and establishment of interaxillary pathway, R inguinal nodes and establishment of axilloinguinal pathway, then R breast moving fluid towards pathways spending extra time in any areas of fibrosis then retracing all steps. ?STM to lateral breast with scar mobilization and fibrosis deeper pressure  ?PROM right shoulder with MFR to the axilla flexion, scaption, abd, ER ? ?05/29/21 ?PT performed MLD to the Rt breast with pt permission and then had pt also practice: In supine: Short neck, 5 diaphragmatic breaths, bil axillary nodes and establishment of interaxillary pathway, R inguinal nodes and establishment of axilloinguinal pathway, then R breast moving fluid towards pathways spending extra time in any areas of  fibrosis then retracing all steps. ?STM to lateral breast with scar mobilization and fibrosis deeper pressure  ?PROM right shoulder with MFR to the axilla flexion, scaption, abd, ER ? ?PATIENT EDUCATION:  ?Education details: supine scapular series with yellow band ?Person educated: Patient ?Education method: Explanation, Demonstration, Tactile cues, Verbal cues, and Handouts ?Education comprehension: verbalized understanding, returned demonstration, verbal cues required, tactile cues required, and needs further education ?  ? HOME EXERCISE PROGRAM: ?Self MLD - see bottom of note, Supine scapular series with yellow band ?  ?ASSESSMENT: ? ?Right Upper Extremity Lymphedema    ?  ?09/07/20  05/09/21 ?10 cm Proximal to Olecranon Process ?34.5 cm   35 ?  ?Olecranon Process ?29.1 cm   30 ?  ?10 cm Proximal to Ulnar Styloid Process ?24.8 cm   25 ?  ?Just Proximal to Ulnar Styloid Process ?17.3 cm   17.7 ?  ?Across Hand at [employer] ?20.6 cm   20 ?  ?At [place] of 2nd Digit ?6.6 cm   6.6 ? ?  ?CLINICAL IMPRESSION: ?Pt continues with fibrotic Rt breast that improves with PT sessions and MT.  May be ready for DC pending availability of flexitouch.  Has not had much pain in the breast recently.  Emailed  flexi regarding status.  ? ?OBJECTIVE IMPAIRMENTS increased fascial restrictions and impaired sensation.  ?  ?ACTIVITY LIMITATIONS  none .  ?PERSONAL FACTORS Fitness and 1-2 comorbidities: SLNB, radiation  are also affecting patient's functional outcome.  ?  ?  ?REHAB POTENTIAL: Excellent ?  ?CLINICAL DECISION MAKING: Stable/uncomplicated ?  ?EVALUATION COMPLEXITY: Low ?  ?GOALS: ?Goals reviewed with patient? Yes ?  ?LONG TERM GOALS: ?  ?Pt will be ind with self MLD for the Rt breast ?Baseline: needs reeducation ?Target date: 05/25/2021 ?Goal status: MET ?  ?2.  Pt will be ind with home stretches post radiation to decrease stiffness ?Baseline: no HEP ?Target date: 05/25/21    ?Goal status: MET ?  ?PLAN: ?PT FREQUENCY: 2x/week ?   ?PT DURATION: 6 weeks ?  ?PLANNED INTERVENTIONS: Therapeutic exercises, Patient/Family education, Joint mobilization, Manual lymph drainage, and Manual therapy ?  ?PLAN FOR NEXT SESSION: cont Rt breast MLD and review prn.

## 2021-06-07 ENCOUNTER — Ambulatory Visit: Payer: 59 | Admitting: Rehabilitation

## 2021-06-07 ENCOUNTER — Encounter: Payer: Self-pay | Admitting: Rehabilitation

## 2021-06-07 DIAGNOSIS — M25611 Stiffness of right shoulder, not elsewhere classified: Secondary | ICD-10-CM

## 2021-06-07 DIAGNOSIS — Z483 Aftercare following surgery for neoplasm: Secondary | ICD-10-CM

## 2021-06-07 DIAGNOSIS — C50411 Malignant neoplasm of upper-outer quadrant of right female breast: Secondary | ICD-10-CM | POA: Diagnosis not present

## 2021-06-07 DIAGNOSIS — R293 Abnormal posture: Secondary | ICD-10-CM

## 2021-06-07 NOTE — Therapy (Signed)
?OUTPATIENT PHYSICAL THERAPY TREATMENT NOTE ? ? ?Patient Name: Jacqueline Adams ?MRN: [id number] ?DOB:1960-10-07, 61 y.o., female ?Today's Date: 06/07/2021 ? ?PCP: Robyne Peers, MD ?REFERRING PROVIDER:Vinay [name] MD ? ? PT End of Session - 06/07/21 0906   ? ? Visit Number 20   ? Number of Visits 24   ? Date for PT Re-Evaluation 06/21/21   ? Authorization - Visit Number 20   ? PT Start Time [phone]   ? PT Stop Time 0947   ? PT Time Calculation (min) 40 min   ? Activity Tolerance Patient tolerated treatment well   ? Behavior During Therapy [facility] for tasks assessed/performed   ? ?  ?  ? ?  ? ? ? ? ? ?Past Medical History:  ?Diagnosis Date  ? BRCA2 gene mutation positive in female   ? Breast cancer ([place]) 01/2020  ? right breast IDC  ? Bronchitis   ? Cervical spondylarthritis   ? Headache(784.0)   ? High cholesterol   ? Hypertension   ? Leukocytosis 2009  ? Multiple sclerosis ([place]) 2009  ? followed by Dr. Erling Cruz  ? Neuromuscular disorder ([place])   ? Pneumonia   ? Rhinitis   ? Seasonal allergies   ? Sinusitis   ? ?Past Surgical History:  ?Procedure Laterality Date  ? ABDOMINAL HYSTERECTOMY  2006  ? uterus, cervix, portion of fallopian tubes for AUB  ? BREAST LUMPECTOMY WITH RADIOACTIVE SEED AND SENTINEL LYMPH NODE BIOPSY Right 08/08/2020  ? Procedure: RIGHT BREAST LUMPECTOMY WITH RADIOACTIVE SEED X 3 AND SENTINEL LYMPH NODE BIOPSY;  Surgeon: Stark Klein, MD;  Location: [place];  Service: General;  Laterality: Right;  ? CESAREAN SECTION    ? x 3  ? CORRECTION HAMMER TOE Bilateral   ? PORT-A-CATH REMOVAL Right 09/21/2020  ? Procedure: REMOVAL PORT-A-CATH;  Surgeon: Stark Klein, MD;  Location: [place];  Service: General;  Laterality: Right;  ? PORTACATH PLACEMENT Right 02/22/2020  ? Procedure: RIGHT SUBCLAVIAN VEIN PORT PLACEMENT;  Surgeon: Donnie Mesa, MD;  Location: [place];  Service: General;  Laterality: Right;  ? ROBOTIC ASSISTED BILATERAL SALPINGO OOPHERECTOMY Bilateral 01/30/2021  ? Procedure: XI  ROBOTIC ASSISTED BILATERAL SALPINGO OOPHORECTOMY, LYSIS OF ADHESIONS, 1 HOUR;  Surgeon: Lafonda Mosses, MD;  [facility];  Service: Gynecology;  Laterality: Bilateral;  ? ?Patient Active Problem List  ? Diagnosis Date Noted  ? Bilateral ovarian cysts   ? Malignant neoplasm of upper-outer quadrant of right breast in female, estrogen receptor negative ([place]) 01/10/2020  ? High risk medication use 03/25/2019  ? Microcytic anemia 12/31/2017  ? 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 UACS 12/16/2017  ? History of pneumonia 11/12/2017  ? Morbid obesity ([place]) 08/19/2016  ? Numbness 05/09/2015  ? Other fatigue 05/09/2015  ? Arthralgia of hip 11/16/2014  ? Low back pain with sciatica 11/16/2014  ? Urinary frequency 11/16/2014  ? Encounter for general adult medical examination without abnormal findings 06/07/2014  ? DOE (dyspnea on exertion) 04/27/2014  ? Atypical chest pain 04/27/2014  ? Dyslipidemia 04/27/2014  ? HLD (hyperlipidemia) 02/14/2014  ? Cervical spondylosis without myelopathy 07/28/2013  ? Cervical pain 07/28/2013  ? CFIDS (chronic fatigue and immune dysfunction syndrome) ([place]) 07/28/2013  ? Chronic infection of sinus 07/28/2013  ? Headache, migraine 07/28/2013  ? DS (disseminated sclerosis) ([place]) 07/28/2013  ? Spasms of the hands or feet 07/28/2013  ? DDD (degenerative disc disease), cervical 07/28/2013  ? Menopausal symptom 07/28/2013  ? Taking drug for chronic disease 07/28/2013  ?  Vitamin D deficiency 07/28/2013  ? Degeneration of intervertebral disc of cervical region 07/28/2013  ? Cough 04/06/2013  ? BRCA gene mutation positive in female 06/12/2011  ? Genetic susceptibility to malignant neoplasm of breast 06/12/2011  ? Multiple sclerosis ([place])   ? Leukocytosis   ? ? ?REFERRING DIAG: Right breast Cancer ? ?THERAPY DIAG:  ?Malignant neoplasm of upper-outer quadrant of right breast in female, estrogen receptor negative ([place]) ? ?Abnormal posture ? ?Stiffness of right shoulder, not elsewhere classified ? ?Aftercare  following surgery for neoplasm ? 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 She underwent neoadjuvant chemotherapy 02/23/2020 - 07/05/2020 followed by a right lumpectomy and sentinel node biopsy (3 negative nodes) on 08/08/2020. Completed radiation.  She has relapsing remitting multiple sclerosis since 2009 ? ?PRECAUTIONS: Right UE lymphedema risk ? ?SUBJECTIVE:  ? ?PAIN: Are you having pain? No, but sometimes tender under the arm ? ?  ? TODAY'S TREATMENT  ?06/07/21 ?PT performed MLD to the Rt breast with pt permission: In supine: Short neck, 5 diaphragmatic breaths, bil axillary nodes and establishment of interaxillary pathway, R inguinal nodes and establishment of axilloinguinal pathway, then R breast moving fluid towards pathways spending extra time in any areas of fibrosis then retracing all steps. ?STM to lateral breast with scar mobilization and fibrosis deeper pressure  ?PROM right shoulder with MFR to the axilla flexion, scaption, abd, ER ? ?06/05/21 ?Family medicine resident present with pt permission today ?PT performed MLD to the Rt breast with pt permission: In supine: Short neck, 5 diaphragmatic breaths, bil axillary nodes and establishment of interaxillary pathway, R inguinal nodes and establishment of axilloinguinal pathway, then R breast moving fluid towards pathways spending extra time in any areas of fibrosis then retracing all steps. ?STM to lateral breast with scar mobilization and fibrosis deeper pressure  ?PROM right shoulder with MFR to the axilla flexion, scaption, abd, ER ? ?05/31/21 ?PT performed MLD to the Rt breast with pt permission: In supine: Short neck, 5 diaphragmatic breaths, bil axillary nodes and establishment of interaxillary pathway, R inguinal nodes and establishment of axilloinguinal pathway, then R breast moving fluid towards pathways spending extra time in any areas of fibrosis then  retracing all steps. ?STM to lateral breast with scar mobilization and fibrosis deeper pressure  ?PROM right shoulder with MFR to the axilla flexion, scaption, abd, ER ? ?PATIENT EDUCATION:  ?Education details: supine scapular series with yellow band ?Person educated: Patient ?Education method: Explanation, Demonstration, Tactile cues, Verbal cues, and Handouts ?Education comprehension: verbalized understanding, returned demonstration, verbal cues required, tactile cues required, and needs further education ?  ? HOME EXERCISE PROGRAM: ?Self MLD - see bottom of note, Supine scapular series with yellow band ?  ?ASSESSMENT: ? ?Right Upper Extremity Lymphedema    ?  ?09/07/20  05/09/21 ?10 cm Proximal to Olecranon Process ?34.5 cm   35 ?  ?Olecranon Process ?29.1 cm   30 ?  ?10 cm Proximal to Ulnar Styloid Process ?24.8 cm   25 ?  ?Just Proximal to Ulnar Styloid Process ?17.3 cm   17.7 ?  ?Across Hand at [employer] ?20.6 cm   20 ?  ?At [place] of 2nd Digit ?6.6 cm   6.6 ? ?  ?CLINICAL IMPRESSION: ?Rt breast looks very good today upon arriving.  Much flatter and more wrinkles demonstrating less edema. Pt continues with fibrotic Rt breast that improves with PT sessions and MT.  Pt will not  be approved for flexitouch.  ? ?OBJECTIVE IMPAIRMENTS increased fascial restrictions and impaired sensation.  ?  ?ACTIVITY LIMITATIONS  none .  ?PERSONAL FACTORS Fitness and 1-2 comorbidities: SLNB, radiation  are also affecting patient's functional outcome.  ?  ?  ?REHAB POTENTIAL: Excellent ?  ?CLINICAL DECISION MAKING: Stable/uncomplicated ?  ?EVALUATION COMPLEXITY: Low ?  ?GOALS: ?Goals reviewed with patient? Yes ?  ?LONG TERM GOALS: ?  ?Pt will be ind with self MLD for the Rt breast ?Baseline: needs reeducation ?Target date: 05/25/2021 ?Goal status: MET ?  ?2.  Pt will be ind with home stretches post radiation to decrease stiffness ?Baseline: no HEP ?Target date: 05/25/21    ?Goal status: MET ?  ?PLAN: ?PT FREQUENCY: 2x/week ?  ?PT  DURATION: 2 weeks ?  ?PLANNED INTERVENTIONS: Therapeutic exercises, Patient/Family education, Joint mobilization, Manual lymph drainage, and Manual therapy ?  ?PLAN FOR NEXT SESSION: cont Rt breast MLD and revie

## 2021-06-11 ENCOUNTER — Inpatient Hospital Stay: Payer: 59 | Admitting: Pharmacist

## 2021-06-11 ENCOUNTER — Other Ambulatory Visit: Payer: Self-pay

## 2021-06-11 ENCOUNTER — Inpatient Hospital Stay: Payer: 59 | Attending: Hematology and Oncology

## 2021-06-11 VITALS — BP 135/66 | HR 89 | Temp 97.7°F | Resp 18 | Ht 64.0 in | Wt 231.5 lb

## 2021-06-11 DIAGNOSIS — Z171 Estrogen receptor negative status [ER-]: Secondary | ICD-10-CM

## 2021-06-11 DIAGNOSIS — C50411 Malignant neoplasm of upper-outer quadrant of right female breast: Secondary | ICD-10-CM | POA: Diagnosis not present

## 2021-06-11 LAB — CBC WITH DIFFERENTIAL (CANCER CENTER ONLY)
Abs Immature Granulocytes: 0.06 10*3/uL (ref 0.00–0.07)
Basophils Absolute: 0 10*3/uL (ref 0.0–0.1)
Basophils Relative: 0 %
Eosinophils Absolute: 0.2 10*3/uL (ref 0.0–0.5)
Eosinophils Relative: 2 %
HCT: 35.4 % — ABNORMAL LOW (ref 36.0–46.0)
Hemoglobin: 12 g/dL (ref 12.0–15.0)
Immature Granulocytes: 1 %
Lymphocytes Relative: 21 %
Lymphs Abs: 2.3 10*3/uL (ref 0.7–4.0)
MCH: 27.8 pg (ref 26.0–34.0)
MCHC: 33.9 g/dL (ref 30.0–36.0)
MCV: 81.9 fL (ref 80.0–100.0)
Monocytes Absolute: 0.4 10*3/uL (ref 0.1–1.0)
Monocytes Relative: 4 %
Neutro Abs: 8 10*3/uL — ABNORMAL HIGH (ref 1.7–7.7)
Neutrophils Relative %: 72 %
Platelet Count: 241 10*3/uL (ref 150–400)
RBC: 4.32 MIL/uL (ref 3.87–5.11)
RDW: 18.8 % — ABNORMAL HIGH (ref 11.5–15.5)
WBC Count: 10.9 10*3/uL — ABNORMAL HIGH (ref 4.0–10.5)
nRBC: 0.2 % (ref 0.0–0.2)

## 2021-06-11 LAB — CMP (CANCER CENTER ONLY)
ALT: 17 U/L (ref 0–44)
AST: 18 U/L (ref 15–41)
Albumin: 3.8 g/dL (ref 3.5–5.0)
Alkaline Phosphatase: 79 U/L (ref 38–126)
Anion gap: 5 (ref 5–15)
BUN: 7 mg/dL (ref 6–20)
CO2: 31 mmol/L (ref 22–32)
Calcium: 9.1 mg/dL (ref 8.9–10.3)
Chloride: 106 mmol/L (ref 98–111)
Creatinine: 0.73 mg/dL (ref 0.44–1.00)
GFR, Estimated: >60 mL/min (ref >60)
Glucose, Bld: 161 mg/dL — ABNORMAL HIGH (ref 70–99)
Potassium: 3.6 mmol/L (ref 3.5–5.1)
Sodium: 142 mmol/L (ref 135–145)
Total Bilirubin: 0.4 mg/dL (ref 0.3–1.2)
Total Protein: 7 g/dL (ref 6.5–8.1)

## 2021-06-11 NOTE — Progress Notes (Signed)
?[place]  ?     Telephone: [phone]?Fax: [phone]  ? ?Oncology Clinical Pharmacist Practitioner Progress Note ? ?Jacqueline Adams was contacted via in-person visit to discuss her chemotherapy regimen for olaparib which they receive under the care of Dr. Nicholas Lose.  ?  ?Current treatment regimen and start date ?Olaparib (03/03/21) -- increased to 300 mg every 12 hours on 05/06/21 ?  ?Interval History ?She continues on olaparib 300 mg by mouth  every 12 hours on days 1 to 28 of a 28-day cycle. This is being given  monotherapy. Therapy is planned to continue until  one year in the adjuvant setting per the [employer] trial data .  ? ?Response to Therapy ?Jacqueline Adams was seen today by clinical pharmacy as follow-up to her olaparib management.  She states that she is tolerating the olaparib fairly well despite having increasing fatigue and new onset headaches.  She has noticed that the fatigue has become more pronounced since increasing her olaparib dose to 300 mg every 12 hours on 05/06/21.  We discussed options which included going back down to the 150 mg every 12 hour dose.  At this time she would like to continue at the current dose and reassess in 1 month.  If no change at that time she may consider decreasing the dose to 150 mg every 12 hours.  She also is reporting daily headaches that have started.  She states that this started about 3.5 weeks ago.  They are alleviated by taking acetaminophen or ibuprofen but then recur the next morning.  She feels that these headaches may be due to seasonal changes and allergies.  She also coincide with her increase in olaparib dose. She does take Flonase, Allegra, and saline spray.  She reports that the pain is more of a dull ache on either side of her temporal area.  She does have a history of migraines but states that these are different in nature.  We discussed different options such as getting imaging of that area today but at this point Ms.  Adams would like to have a follow-up appointment in 1 month and if symptoms have not improved or gotten any worse she will contact the clinic sooner.  If she continues to have headaches at the next appointment, we will obtain imaging of the area per her preference.  We did mention to Jacqueline Adams that it also could be from the olaparib with reports is as high as 20% incidence of headaches. She increased the olaparib dose on 05/06/21. ? ?At Dr. Geralyn Flash last visit, he stated that Jacqueline Adams could be seen every 2 months by clinical pharmacy and himself after her visit today.  However, due to the fatigue and headaches, we will see her back again in 1 month with labs. Labs, vitals, treatment parameters, and manufacturer guidelines assessing toxicity were reviewed with Jacqueline Adams today. Based on these values, patient is in agreement to continue olaparib therapy at this time. ? ?Allergies ?Allergies  ?Allergen Reactions  ? Nabumetone Swelling and Hives  ? Procaine Swelling  ? Shellfish Allergy Anaphylaxis  ? ? ?Vitals ? ?  06/11/2021  ? 10:01 AM 05/28/2021  ? 10:00 AM 05/28/2021  ?  9:21 AM  ?Vitals with BMI  ?Height '5\' 4"'$   '5\' 4"'$   ?Weight 231 lbs 8 oz  229 lbs 10 oz  ?BMI 39.72  39.39  ?Systolic 657 846 962  ?Diastolic 66 70 96  ?Pulse 89  92  ?  ? ?  Laboratory Data ? ?  Latest Ref Rng & Units 06/11/2021  ?  9:12 AM 05/14/2021  ?  9:18 AM 04/16/2021  ?  9:45 AM  ?CBC EXTENDED  ?WBC 4.0 - 10.5 K/uL 10.9   9.6   9.8    ?RBC 3.87 - 5.11 MIL/uL 4.32   4.58   4.65    ?Hemoglobin 12.0 - 15.0 g/dL 12.0   12.2   12.1    ?HCT 36.0 - 46.0 % 35.4   37.0   37.0    ?Platelets 150 - 400 K/uL 241   253   254    ?NEUT# 1.7 - 7.7 K/uL 8.0   6.6   6.6    ?Lymph# 0.7 - 4.0 K/uL 2.3   2.4   2.4    ?  ? ?  Latest Ref Rng & Units 06/11/2021  ?  9:12 AM 05/14/2021  ?  9:18 AM 04/16/2021  ?  9:45 AM  ?CMP  ?Glucose 70 - 99 mg/dL 161   136   133    ?BUN 6 - 20 mg/dL '7   8   6    '$ ?Creatinine 0.44 - 1.00 mg/dL 0.73   0.71   0.73    ?Sodium 135 - 145  mmol/L 142   142   141    ?Potassium 3.5 - 5.1 mmol/L 3.6   3.5   3.6    ?Chloride 98 - 111 mmol/L 106   106   106    ?CO2 22 - 32 mmol/L '31   29   26    '$ ?Calcium 8.9 - 10.3 mg/dL 9.1   8.8   9.1    ?Total Protein 6.5 - 8.1 g/dL 7.0   7.2   7.4    ?Total Bilirubin 0.3 - 1.2 mg/dL 0.4   0.5   0.4    ?Alkaline Phos 38 - 126 U/L 79   77   83    ?AST 15 - 41 U/L '18   19   17    '$ ?ALT 0 - 44 U/L '17   18   15    '$ ?  ?Lab Results  ?Component Value Date  ? MG 1.8 05/22/2020  ?  ? ?Adverse Effects Assessment ?Fatigue: Increasing since olaparib dose was increased on 05/06/21.  Discussed options such as decreasing dose back to 150 mg every 12 hours.  At this time Jacqueline Adams would like to give it 1 more month, and if fatigue continues, we will likely decrease the dose at that time ?Headaches: Started about 3.5 weeks ago which coincides with her increase in olaparib dose.  Incidence of headaches with olaparib approach 20% and clinical trial data.  Jacqueline Adams states that she does suffer from seasonal allergies and takes Flonase, Allegra, and saline spray for her symptoms.  We discussed getting imaging today but Jacqueline Adams prefers to have a follow-up appointment in 1 month to reassess since she has had headaches in the past due to seasonal allergies and olaparib has known incidence of headaches.  We felt this was reasonable. ? ?Adherence Assessment ?Jacqueline Adams reports missing 0 doses over the past 4 weeks.   ?Reason for missed dose: N/A ?Patient was re-educated on importance of adherence.  ? ?Access Assessment ?Jacqueline Adams is currently receiving her olaparib through [facility] ?Insurance concerns: None ? ?Medication Reconciliation ?The patient's medication list was reviewed today with the patient?  Yes ?New medications or herbal supplements have recently been  started?  No ?Any medications have been discontinued?  No ?The medication list was updated and reconciled based on the patient's most recent  medication list in the electronic medical record (EMR) including herbal products and OTC medications.  ? ?Medications ?Current Outpatient Medications  ?Medication Sig Dispense Refill  ? AVONEX PREFILLED 30 MCG/0.5ML PSKT injection INJECT ONE SYRINGE INTRAMUSCULARLY ONCE WEEKLY. REFRIGERATE. PROTECT FROM LIGHT. ALLOW TO WARM TO ROOM TEMPERATURE PRIOR TO USE. 3 each 3  ? Calcium Carb-Cholecalciferol (CALCIUM 600+D3 PO) Take 1 tablet by mouth in the morning and at bedtime.    ? ELDERBERRY PO Take by mouth. Patient report taking 2 gummies daily    ? Ferrous Sulfate Dried (SLOW RELEASE IRON) 45 MG TBCR Take 45 mg by mouth in the morning.    ? fexofenadine (ALLEGRA) 180 MG tablet Take 180 mg by mouth daily as needed for allergies.    ? fluticasone (FLONASE) 50 MCG/ACT nasal spray Place 2 sprays into both nostrils daily. (Patient taking differently: Place 2 sprays into both nostrils daily as needed for allergies.) 16 g 2  ? ibuprofen (ADVIL) 200 MG tablet Take 400 mg by mouth every Friday. W/Avonex injection    ? KLOR-CON M20 20 MEQ tablet TAKE 1 TABLET BY MOUTH EVERY DAY IN THE MORNING 30 tablet 1  ? meclizine (ANTIVERT) 25 MG tablet Take 1 tablet (25 mg total) by mouth 3 (three) times daily as needed for dizziness. 30 tablet 0  ? metoprolol succinate (TOPROL-XL) 25 MG 24 hr tablet Take 25 mg by mouth daily.    ? Multiple Vitamin (MULTIVITAMIN WITH MINERALS) TABS tablet Take 1 tablet by mouth every evening. Centrum    ? olaparib (LYNPARZA) 150 MG tablet Take 2 tablets (300 mg total) by mouth 2 (two) times daily. Swallow whole. May take with food to decrease nausea and vomiting. 120 tablet 5  ? Omega-3 Fatty Acids (FISH OIL) 1000 MG CPDR Take 1,000 mg by mouth daily.    ? simvastatin (ZOCOR) 20 MG tablet Take 20 mg by mouth every evening.    ? VENTOLIN HFA 108 (90 BASE) MCG/ACT inhaler Inhale 1-2 puffs into the lungs every 6 (six) hours as needed for shortness of breath or wheezing.    ? ?No current facility-administered  medications for this visit.  ? ? ?Drug-Drug Interactions (DDIs) ?DDIs were evaluated?  Yes ?Significant DDIs?  No ?The patient was instructed to speak with their health care provider and/or the oral chemothera

## 2021-06-12 ENCOUNTER — Encounter: Payer: Self-pay | Admitting: Rehabilitation

## 2021-06-12 ENCOUNTER — Ambulatory Visit: Payer: 59 | Attending: Hematology and Oncology | Admitting: Rehabilitation

## 2021-06-12 ENCOUNTER — Telehealth: Payer: Self-pay | Admitting: Pharmacist

## 2021-06-12 DIAGNOSIS — Z171 Estrogen receptor negative status [ER-]: Secondary | ICD-10-CM | POA: Diagnosis present

## 2021-06-12 DIAGNOSIS — R293 Abnormal posture: Secondary | ICD-10-CM | POA: Insufficient documentation

## 2021-06-12 DIAGNOSIS — M25611 Stiffness of right shoulder, not elsewhere classified: Secondary | ICD-10-CM | POA: Diagnosis present

## 2021-06-12 DIAGNOSIS — C50411 Malignant neoplasm of upper-outer quadrant of right female breast: Secondary | ICD-10-CM | POA: Insufficient documentation

## 2021-06-12 DIAGNOSIS — Z483 Aftercare following surgery for neoplasm: Secondary | ICD-10-CM | POA: Diagnosis present

## 2021-06-12 NOTE — Telephone Encounter (Signed)
Per 5/1 los called and spoke to pt about lab and pharm appointment.  Pt confirmed appointment  ?

## 2021-06-12 NOTE — Therapy (Signed)
?OUTPATIENT PHYSICAL THERAPY TREATMENT NOTE ? ? ?Patient Name: Jacqueline Adams ?MRN: [id number] ?DOB:1960-05-21, 61 y.o., female ?Today's Date: 06/12/2021 ? ?PCP: Robyne Peers, MD ?REFERRING PROVIDER:Vinay [name] MD ? ? PT End of Session - 06/12/21 0859   ? ? Visit Number 21   ? Number of Visits 24   ? Date for PT Re-Evaluation 06/21/21   ? PT Start Time 0900   ? PT Stop Time 9629   ? PT Time Calculation (min) 45 min   ? Activity Tolerance Patient tolerated treatment well   ? Behavior During Therapy [facility] for tasks assessed/performed   ? ?  ?  ? ?  ? ? ? ? ? ? ?Past Medical History:  ?Diagnosis Date  ? BRCA2 gene mutation positive in female   ? Breast cancer ([place]) 01/2020  ? right breast IDC  ? Bronchitis   ? Cervical spondylarthritis   ? Headache(784.0)   ? High cholesterol   ? Hypertension   ? Leukocytosis 2009  ? Multiple sclerosis ([place]) 2009  ? followed by Dr. Erling Cruz  ? Neuromuscular disorder ([place])   ? Pneumonia   ? Rhinitis   ? Seasonal allergies   ? Sinusitis   ? ?Past Surgical History:  ?Procedure Laterality Date  ? ABDOMINAL HYSTERECTOMY  2006  ? uterus, cervix, portion of fallopian tubes for AUB  ? BREAST LUMPECTOMY WITH RADIOACTIVE SEED AND SENTINEL LYMPH NODE BIOPSY Right 08/08/2020  ? Procedure: RIGHT BREAST LUMPECTOMY WITH RADIOACTIVE SEED X 3 AND SENTINEL LYMPH NODE BIOPSY;  Surgeon: Stark Klein, MD;  Location: [place];  Service: General;  Laterality: Right;  ? CESAREAN SECTION    ? x 3  ? CORRECTION HAMMER TOE Bilateral   ? PORT-A-CATH REMOVAL Right 09/21/2020  ? Procedure: REMOVAL PORT-A-CATH;  Surgeon: Stark Klein, MD;  Location: [place];  Service: General;  Laterality: Right;  ? PORTACATH PLACEMENT Right 02/22/2020  ? Procedure: RIGHT SUBCLAVIAN VEIN PORT PLACEMENT;  Surgeon: Donnie Mesa, MD;  Location: [place];  Service: General;  Laterality: Right;  ? ROBOTIC ASSISTED BILATERAL SALPINGO OOPHERECTOMY Bilateral 01/30/2021  ? Procedure: XI ROBOTIC ASSISTED BILATERAL SALPINGO  OOPHORECTOMY, LYSIS OF ADHESIONS, 1 HOUR;  Surgeon: Lafonda Mosses, MD;  Location: [facility];  Service: Gynecology;  Laterality: Bilateral;  ? ?Patient Active Problem List  ? Diagnosis Date Noted  ? Bilateral ovarian cysts   ? Malignant neoplasm of upper-outer quadrant of right breast in female, estrogen receptor negative ([place]) 01/10/2020  ? High risk medication use 03/25/2019  ? Microcytic anemia 12/31/2017  ? 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 UACS 12/16/2017  ? History of pneumonia 11/12/2017  ? Morbid obesity ([place]) 08/19/2016  ? Numbness 05/09/2015  ? Other fatigue 05/09/2015  ? Arthralgia of hip 11/16/2014  ? Low back pain with sciatica 11/16/2014  ? Urinary frequency 11/16/2014  ? Encounter for general adult medical examination without abnormal findings 06/07/2014  ? DOE (dyspnea on exertion) 04/27/2014  ? Atypical chest pain 04/27/2014  ? Dyslipidemia 04/27/2014  ? HLD (hyperlipidemia) 02/14/2014  ? Cervical spondylosis without myelopathy 07/28/2013  ? Cervical pain 07/28/2013  ? CFIDS (chronic fatigue and immune dysfunction syndrome) ([place]) 07/28/2013  ? Chronic infection of sinus 07/28/2013  ? Headache, migraine 07/28/2013  ? DS (disseminated sclerosis) ([place]) 07/28/2013  ? Spasms of the hands or feet 07/28/2013  ? DDD (degenerative disc disease), cervical 07/28/2013  ? Menopausal symptom 07/28/2013  ? Taking drug for chronic disease 07/28/2013  ? Vitamin D deficiency 07/28/2013  ? Degeneration  of intervertebral disc of cervical region 07/28/2013  ? Cough 04/06/2013  ? BRCA gene mutation positive in female 06/12/2011  ? Genetic susceptibility to malignant neoplasm of breast 06/12/2011  ? Multiple sclerosis ([place])   ? Leukocytosis   ? ? ?REFERRING DIAG: Right breast Cancer ? ?THERAPY DIAG:  ?Malignant neoplasm of upper-outer quadrant of right breast in female, estrogen receptor negative ([place]) ? ?Aftercare following surgery for neoplasm ? ?Stiffness of right shoulder, not elsewhere classified ? ?Abnormal  posture ? 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 She underwent neoadjuvant chemotherapy 02/23/2020 - 07/05/2020 followed by a right lumpectomy and sentinel node biopsy (3 negative nodes) on 08/08/2020. Completed radiation.  She has relapsing remitting multiple sclerosis since 2009 ? ?PRECAUTIONS: Right UE lymphedema risk ? ?SUBJECTIVE: still doing well  ? ?PAIN: Are you having pain? No, but sometimes tender under the arm ? ?  ? TODAY'S TREATMENT  ?06/12/21 ?PT performed MLD to the Rt breast with pt permission: In supine: Short neck, 5 diaphragmatic breaths, bil axillary nodes and establishment of interaxillary pathway, R inguinal nodes and establishment of axilloinguinal pathway, then R breast moving fluid towards pathways spending extra time in any areas of fibrosis then retracing all steps. ?STM to lateral breast with scar mobilization and fibrosis deeper pressure  ?PROM right shoulder with MFR to the axilla flexion, scaption, abd, ER ? ?06/07/21 ?PT performed MLD to the Rt breast with pt permission: In supine: Short neck, 5 diaphragmatic breaths, bil axillary nodes and establishment of interaxillary pathway, R inguinal nodes and establishment of axilloinguinal pathway, then R breast moving fluid towards pathways spending extra time in any areas of fibrosis then retracing all steps. ?STM to lateral breast with scar mobilization and fibrosis deeper pressure  ?PROM right shoulder with MFR to the axilla flexion, scaption, abd, ER ? ?06/05/21 ?Family medicine resident present with pt permission today ?PT performed MLD to the Rt breast with pt permission: In supine: Short neck, 5 diaphragmatic breaths, bil axillary nodes and establishment of interaxillary pathway, R inguinal nodes and establishment of axilloinguinal pathway, then R breast moving fluid towards pathways spending extra time in any areas of fibrosis then retracing  all steps. ?STM to lateral breast with scar mobilization and fibrosis deeper pressure  ?PROM right shoulder with MFR to the axilla flexion, scaption, abd, ER ? ?PATIENT EDUCATION:  ?Education details: supine scapular series with yellow band ?Person educated: Patient ?Education method: Explanation, Demonstration, Tactile cues, Verbal cues, and Handouts ?Education comprehension: verbalized understanding, returned demonstration, verbal cues required, tactile cues required, and needs further education ?  ? HOME EXERCISE PROGRAM: ?Self MLD - see bottom of note, Supine scapular series with yellow band ?  ?ASSESSMENT: ? ?Right Upper Extremity Lymphedema    ?  ?09/07/20  05/09/21 ?10 cm Proximal to Olecranon Process ?34.5 cm   35 ?  ?Olecranon Process ?29.1 cm   30 ?  ?10 cm Proximal to Ulnar Styloid Process ?24.8 cm   25 ?  ?Just Proximal to Ulnar Styloid Process ?17.3 cm   17.7 ?  ?Across Hand at [employer] ?20.6 cm   20 ?  ?At [place] of 2nd Digit ?6.6 cm   6.6 ? ?  ?CLINICAL IMPRESSION: ?Pt continues with fibrotic Rt breast that improves with PT sessions and MT.  Pt will not be approved for flexitouch. Seeming to be left with hematoma scar tissue but the breast is still pitting.  ? ?OBJECTIVE  IMPAIRMENTS increased fascial restrictions and impaired sensation.  ?  ?ACTIVITY LIMITATIONS  none .  ?PERSONAL FACTORS Fitness and 1-2 comorbidities: SLNB, radiation  are also affecting patient's functional outcome.  ?  ?  ?REHAB POTENTIAL: Excellent ?  ?CLINICAL DECISION MAKING: Stable/uncomplicated ?  ?EVALUATION COMPLEXITY: Low ?  ?GOALS: ?Goals reviewed with patient? Yes ?  ?LONG TERM GOALS: ?  ?Pt will be ind with self MLD for the Rt breast ?Baseline: needs reeducation ?Target date: 05/25/2021 ?Goal status: MET ?  ?2.  Pt will be ind with home stretches post radiation to decrease stiffness ?Baseline: no HEP ?Target date: 05/25/21    ?Goal status: MET ?  ?PLAN: ?PT FREQUENCY: 2x/week ?  ?PT DURATION: 2 weeks ?  ?PLANNED  INTERVENTIONS: Therapeutic exercises, Patient/Family education, Joint mobilization, Manual lymph drainage, and Manual therapy ?  ?PLAN FOR NEXT SESSION: cont Rt breast MLD and review prn.   ? ? ? ?Stark Bray, PT ?06/12/2021, 9

## 2021-06-14 ENCOUNTER — Encounter: Payer: Self-pay | Admitting: Rehabilitation

## 2021-06-14 ENCOUNTER — Ambulatory Visit: Payer: 59 | Admitting: Rehabilitation

## 2021-06-14 DIAGNOSIS — Z483 Aftercare following surgery for neoplasm: Secondary | ICD-10-CM

## 2021-06-14 DIAGNOSIS — C50411 Malignant neoplasm of upper-outer quadrant of right female breast: Secondary | ICD-10-CM | POA: Diagnosis not present

## 2021-06-14 DIAGNOSIS — M25611 Stiffness of right shoulder, not elsewhere classified: Secondary | ICD-10-CM

## 2021-06-14 DIAGNOSIS — R293 Abnormal posture: Secondary | ICD-10-CM

## 2021-06-14 NOTE — Therapy (Signed)
?OUTPATIENT PHYSICAL THERAPY TREATMENT NOTE ? ? ?Patient Name: Jacqueline Adams ?MRN: [id number] ?DOB:1960-12-07, 61 y.o., female ?Today's Date: 06/14/2021 ? ?PCP: Robyne Peers, MD ?REFERRING PROVIDER:Vinay [name] MD ? ? PT End of Session - 06/14/21 0945   ? ? Visit Number 22   ? Number of Visits 24   ? Date for PT Re-Evaluation 06/21/21   ? Authorization - Visit Number 22   ? Authorization - Number of Visits 60   ? PT Start Time 0900   ? PT Stop Time 7116   ? PT Time Calculation (min) 45 min   ? Activity Tolerance Patient tolerated treatment well   ? Behavior During Therapy [facility] for tasks assessed/performed   ? ?  ?  ? ?  ? ? ? ? ? ? ?Past Medical History:  ?Diagnosis Date  ? BRCA2 gene mutation positive in female   ? Breast cancer ([place]) 01/2020  ? right breast IDC  ? Bronchitis   ? Cervical spondylarthritis   ? Headache(784.0)   ? High cholesterol   ? Hypertension   ? Leukocytosis 2009  ? Multiple sclerosis ([place]) 2009  ? followed by Dr. Erling Cruz  ? Neuromuscular disorder ([place])   ? Pneumonia   ? Rhinitis   ? Seasonal allergies   ? Sinusitis   ? ?Past Surgical History:  ?Procedure Laterality Date  ? ABDOMINAL HYSTERECTOMY  2006  ? uterus, cervix, portion of fallopian tubes for AUB  ? BREAST LUMPECTOMY WITH RADIOACTIVE SEED AND SENTINEL LYMPH NODE BIOPSY Right 08/08/2020  ? Procedure: RIGHT BREAST LUMPECTOMY WITH RADIOACTIVE SEED X 3 AND SENTINEL LYMPH NODE BIOPSY;  Surgeon: Stark Klein, MD;  Location: [place];  Service: General;  Laterality: Right;  ? CESAREAN SECTION    ? x 3  ? CORRECTION HAMMER TOE Bilateral   ? PORT-A-CATH REMOVAL Right 09/21/2020  ? Procedure: REMOVAL PORT-A-CATH;  Surgeon: Stark Klein, MD;  Location: [place];  Service: General;  Laterality: Right;  ? PORTACATH PLACEMENT Right 02/22/2020  ? Procedure: RIGHT SUBCLAVIAN VEIN PORT PLACEMENT;  Surgeon: Donnie Mesa, MD;  Location: [place];  Service: General;  Laterality: Right;  ? ROBOTIC ASSISTED BILATERAL SALPINGO OOPHERECTOMY  Bilateral 01/30/2021  ? Procedure: XI ROBOTIC ASSISTED BILATERAL SALPINGO OOPHORECTOMY, LYSIS OF ADHESIONS, 1 HOUR;  Surgeon: Lafonda Mosses, MD;  [facility];  Service: Gynecology;  Laterality: Bilateral;  ? ?Patient Active Problem List  ? Diagnosis Date Noted  ? Bilateral ovarian cysts   ? Malignant neoplasm of upper-outer quadrant of right breast in female, estrogen receptor negative ([place]) 01/10/2020  ? High risk medication use 03/25/2019  ? Microcytic anemia 12/31/2017  ? 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 UACS 12/16/2017  ? History of pneumonia 11/12/2017  ? Morbid obesity ([place]) 08/19/2016  ? Numbness 05/09/2015  ? Other fatigue 05/09/2015  ? Arthralgia of hip 11/16/2014  ? Low back pain with sciatica 11/16/2014  ? Urinary frequency 11/16/2014  ? Encounter for general adult medical examination without abnormal findings 06/07/2014  ? DOE (dyspnea on exertion) 04/27/2014  ? Atypical chest pain 04/27/2014  ? Dyslipidemia 04/27/2014  ? HLD (hyperlipidemia) 02/14/2014  ? Cervical spondylosis without myelopathy 07/28/2013  ? Cervical pain 07/28/2013  ? CFIDS (chronic fatigue and immune dysfunction syndrome) ([place]) 07/28/2013  ? Chronic infection of sinus 07/28/2013  ? Headache, migraine 07/28/2013  ? 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 Spasms of the hands or feet 07/28/2013  ? DDD (degenerative disc disease), cervical 07/28/2013  ? Menopausal symptom 07/28/2013  ?  Taking drug for chronic disease 07/28/2013  ? Vitamin D deficiency 07/28/2013  ? Degeneration of intervertebral disc of cervical region 07/28/2013  ? Cough 04/06/2013  ? BRCA gene mutation positive in female 06/12/2011  ? Genetic susceptibility to malignant neoplasm of breast 06/12/2011  ? Multiple sclerosis ([place])   ? Leukocytosis   ? ? ?REFERRING DIAG: Right breast Cancer ? ?THERAPY DIAG:  ?Malignant neoplasm of upper-outer quadrant of right breast in female, estrogen receptor negative ([place]) ? ?Stiffness of right shoulder, not elsewhere  classified ? ?Abnormal posture ? ?Aftercare following surgery for neoplasm ? 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 She underwent neoadjuvant chemotherapy 02/23/2020 - 07/05/2020 followed by a right lumpectomy and sentinel node biopsy (3 negative nodes) on 08/08/2020. Completed radiation.  She has relapsing remitting multiple sclerosis since 2009 ? ?PRECAUTIONS: Right UE lymphedema risk ? ?SUBJECTIVE: nothing new ? ?PAIN: Are you having pain? No, but sometimes tender under the arm ? ?  ? TODAY'S TREATMENT  ?06/14/21 ?PT performed MLD to the Rt breast with pt permission: In supine: Short neck, 5 diaphragmatic breaths, bil axillary nodes and establishment of interaxillary pathway, R inguinal nodes and establishment of axilloinguinal pathway, then R breast moving fluid towards pathways spending extra time in any areas of fibrosis then retracing all steps. ?STM to lateral breast with scar mobilization and fibrosis deeper pressure  ?PROM right shoulder with MFR to the axilla flexion, scaption, abd, ER ? ?06/12/21 ?PT performed MLD to the Rt breast with pt permission: In supine: Short neck, 5 diaphragmatic breaths, bil axillary nodes and establishment of interaxillary pathway, R inguinal nodes and establishment of axilloinguinal pathway, then R breast moving fluid towards pathways spending extra time in any areas of fibrosis then retracing all steps. ?STM to lateral breast with scar mobilization and fibrosis deeper pressure  ?PROM right shoulder with MFR to the axilla flexion, scaption, abd, ER ? ?06/07/21 ?PT performed MLD to the Rt breast with pt permission: In supine: Short neck, 5 diaphragmatic breaths, bil axillary nodes and establishment of interaxillary pathway, R inguinal nodes and establishment of axilloinguinal pathway, then R breast moving fluid towards pathways spending extra time in any areas of fibrosis then retracing  all steps. ?STM to lateral breast with scar mobilization and fibrosis deeper pressure  ?PROM right shoulder with MFR to the axilla flexion, scaption, abd, ER ? ?PATIENT EDUCATION:  ?Education details: supine scapular series with yellow band ?Person educated: Patient ?Education method: Explanation, Demonstration, Tactile cues, Verbal cues, and Handouts ?Education comprehension: verbalized understanding, returned demonstration, verbal cues required, tactile cues required, and needs further education ?  ? HOME EXERCISE PROGRAM: ?Self MLD - see bottom of note, Supine scapular series with yellow band ?  ?ASSESSMENT: ? ?Right Upper Extremity Lymphedema    ?  ?09/07/20  05/09/21 ?10 cm Proximal to Olecranon Process ?34.5 cm   35 ?  ?Olecranon Process ?29.1 cm   30 ?  ?10 cm Proximal to Ulnar Styloid Process ?24.8 cm   25 ?  ?Just Proximal to Ulnar Styloid Process ?17.3 cm   17.7 ?  ?Across Hand at [employer] ?20.6 cm   20 ?  ?At [place] of 2nd Digit ?6.6 cm   6.6 ? ?  ?CLINICAL IMPRESSION: ?Pt continues with fibrotic Rt breast that improves with PT sessions and MT.  Pt will not be approved for flexitouch. Seeming to be left with hematoma scar tissue but the breast is  still pitting.  ? ?OBJECTIVE IMPAIRMENTS increased fascial restrictions and impaired sensation.  ?  ?ACTIVITY LIMITATIONS  none .  ?PERSONAL FACTORS Fitness and 1-2 comorbidities: SLNB, radiation  are also affecting patient's functional outcome.  ?  ?  ?REHAB POTENTIAL: Excellent ?  ?CLINICAL DECISION MAKING: Stable/uncomplicated ?  ?EVALUATION COMPLEXITY: Low ?  ?GOALS: ?Goals reviewed with patient? Yes ?  ?LONG TERM GOALS: ?  ?Pt will be ind with self MLD for the Rt breast ?Baseline: needs reeducation ?Target date: 05/25/2021 ?Goal status: MET ?  ?2.  Pt will be ind with home stretches post radiation to decrease stiffness ?Baseline: no HEP ?Target date: 05/25/21    ?Goal status: MET ?  ?PLAN: ?PT FREQUENCY: 2x/week ?  ?PT DURATION: 2 weeks ?  ?PLANNED  INTERVENTIONS: Therapeutic exercises, Patient/Family education, Joint mobilization, Manual lymph drainage, and Manual therapy ?  ?PLAN FOR NEXT SESSION: cont Rt breast MLD and review prn.   ? ? ? ?Stark Bray, PT ?5

## 2021-06-18 NOTE — Therapy (Incomplete)
?OUTPATIENT PHYSICAL THERAPY TREATMENT NOTE ? ? ?Patient Name: Jacqueline Adams ?MRN: [id number] ?DOB:10-Dec-1960, 61 y.o., female ?Today's Date: 06/18/2021 ? ?PCP: Robyne Peers, MD ?REFERRING PROVIDER:Vinay [name] MD ? ? ? ? ? ? ? ? ?Past Medical History:  ?Diagnosis Date  ? BRCA2 gene mutation positive in female   ? Breast cancer ([place]) 01/2020  ? right breast IDC  ? Bronchitis   ? Cervical spondylarthritis   ? Headache(784.0)   ? High cholesterol   ? Hypertension   ? Leukocytosis 2009  ? Multiple sclerosis ([place]) 2009  ? followed by Dr. Erling Cruz  ? Neuromuscular disorder ([place])   ? Pneumonia   ? Rhinitis   ? Seasonal allergies   ? Sinusitis   ? ?Past Surgical History:  ?Procedure Laterality Date  ? ABDOMINAL HYSTERECTOMY  2006  ? uterus, cervix, portion of fallopian tubes for AUB  ? BREAST LUMPECTOMY WITH RADIOACTIVE SEED AND SENTINEL LYMPH NODE BIOPSY Right 08/08/2020  ? Procedure: RIGHT BREAST LUMPECTOMY WITH RADIOACTIVE SEED X 3 AND SENTINEL LYMPH NODE BIOPSY;  Surgeon: Stark Klein, MD;  Location: [place];  Service: General;  Laterality: Right;  ? CESAREAN SECTION    ? x 3  ? CORRECTION HAMMER TOE Bilateral   ? PORT-A-CATH REMOVAL Right 09/21/2020  ? Procedure: REMOVAL PORT-A-CATH;  Surgeon: Stark Klein, MD;  Location: [place];  Service: General;  Laterality: Right;  ? PORTACATH PLACEMENT Right 02/22/2020  ? Procedure: RIGHT SUBCLAVIAN VEIN PORT PLACEMENT;  Surgeon: Donnie Mesa, MD;  Location: [place];  Service: General;  Laterality: Right;  ? ROBOTIC ASSISTED BILATERAL SALPINGO OOPHERECTOMY Bilateral 01/30/2021  ? Procedure: XI ROBOTIC ASSISTED BILATERAL SALPINGO OOPHORECTOMY, LYSIS OF ADHESIONS, 1 HOUR;  Surgeon: Lafonda Mosses, MD;  [facility];  Service: Gynecology;  Laterality: Bilateral;  ? ?Patient Active Problem List  ? Diagnosis Date Noted  ? Bilateral ovarian cysts   ? Malignant neoplasm of upper-outer quadrant of right breast in female, estrogen receptor negative ([place])  01/10/2020  ? High risk medication use 03/25/2019  ? Microcytic anemia 12/31/2017  ? 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 UACS 12/16/2017  ? History of pneumonia 11/12/2017  ? Morbid obesity ([place]) 08/19/2016  ? Numbness 05/09/2015  ? Other fatigue 05/09/2015  ? Arthralgia of hip 11/16/2014  ? Low back pain with sciatica 11/16/2014  ? Urinary frequency 11/16/2014  ? Encounter for general adult medical examination without abnormal findings 06/07/2014  ? DOE (dyspnea on exertion) 04/27/2014  ? Atypical chest pain 04/27/2014  ? Dyslipidemia 04/27/2014  ? HLD (hyperlipidemia) 02/14/2014  ? Cervical spondylosis without myelopathy 07/28/2013  ? Cervical pain 07/28/2013  ? CFIDS (chronic fatigue and immune dysfunction syndrome) ([place]) 07/28/2013  ? Chronic infection of sinus 07/28/2013  ? Headache, migraine 07/28/2013  ? 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 Spasms of the hands or feet 07/28/2013  ? DDD (degenerative disc disease), cervical 07/28/2013  ? Menopausal symptom 07/28/2013  ? Taking drug for chronic disease 07/28/2013  ? Vitamin D deficiency 07/28/2013  ? Degeneration of intervertebral disc of cervical region 07/28/2013  ? Cough 04/06/2013  ? BRCA gene mutation positive in female 06/12/2011  ? Genetic susceptibility to malignant neoplasm of breast 06/12/2011  ? Multiple sclerosis ([place])   ? Leukocytosis   ? ? ?REFERRING DIAG: Right breast Cancer ? ?THERAPY DIAG:  ?No diagnosis found. ? 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 She underwent neoadjuvant chemotherapy 02/23/2020 - 07/05/2020  followed by a right lumpectomy and sentinel node biopsy (3 negative nodes) on 08/08/2020. Completed radiation.  She has relapsing remitting multiple sclerosis since 2009 ? ?PRECAUTIONS: Right UE lymphedema risk ? ?SUBJECTIVE: nothing new ? ?PAIN: Are you having pain? No, but sometimes tender under the arm ? ?  ? TODAY'S TREATMENT  ?06/14/21 ?PT  performed MLD to the Rt breast with pt permission: In supine: Short neck, 5 diaphragmatic breaths, bil axillary nodes and establishment of interaxillary pathway, R inguinal nodes and establishment of axilloinguinal pathway, then R breast moving fluid towards pathways spending extra time in any areas of fibrosis then retracing all steps. ?STM to lateral breast with scar mobilization and fibrosis deeper pressure  ?PROM right shoulder with MFR to the axilla flexion, scaption, abd, ER ? ?06/12/21 ?PT performed MLD to the Rt breast with pt permission: In supine: Short neck, 5 diaphragmatic breaths, bil axillary nodes and establishment of interaxillary pathway, R inguinal nodes and establishment of axilloinguinal pathway, then R breast moving fluid towards pathways spending extra time in any areas of fibrosis then retracing all steps. ?STM to lateral breast with scar mobilization and fibrosis deeper pressure  ?PROM right shoulder with MFR to the axilla flexion, scaption, abd, ER ? ?06/07/21 ?PT performed MLD to the Rt breast with pt permission: In supine: Short neck, 5 diaphragmatic breaths, bil axillary nodes and establishment of interaxillary pathway, R inguinal nodes and establishment of axilloinguinal pathway, then R breast moving fluid towards pathways spending extra time in any areas of fibrosis then retracing all steps. ?STM to lateral breast with scar mobilization and fibrosis deeper pressure  ?PROM right shoulder with MFR to the axilla flexion, scaption, abd, ER ? ?PATIENT EDUCATION:  ?Education details: supine scapular series with yellow band ?Person educated: Patient ?Education method: Explanation, Demonstration, Tactile cues, Verbal cues, and Handouts ?Education comprehension: verbalized understanding, returned demonstration, verbal cues required, tactile cues required, and needs further education ?  ? HOME EXERCISE PROGRAM: ?Self MLD - see bottom of note, Supine scapular series with yellow band ?   ?ASSESSMENT: ? ?Right Upper Extremity Lymphedema    ?  ?09/07/20  05/09/21 ?10 cm Proximal to Olecranon Process ?34.5 cm   35 ?  ?Olecranon Process ?29.1 cm   30 ?  ?10 cm Proximal to Ulnar Styloid Process ?24.8 cm   25 ?  ?Just Proximal to Ulnar Styloid Process ?17.3 cm   17.7 ?  ?Across Hand at [employer] ?20.6 cm   20 ?  ?At [place] of 2nd Digit ?6.6 cm   6.6 ? ?  ?CLINICAL IMPRESSION: ?Pt continues with fibrotic Rt breast that improves with PT sessions and MT.  Pt will not be approved for flexitouch. Seeming to be left with hematoma scar tissue but the breast is still pitting.  ? ?OBJECTIVE IMPAIRMENTS increased fascial restrictions and impaired sensation.  ?  ?ACTIVITY LIMITATIONS  none .  ?PERSONAL FACTORS Fitness and 1-2 comorbidities: SLNB, radiation  are also affecting patient's functional outcome.  ?  ?  ?REHAB POTENTIAL: Excellent ?  ?CLINICAL DECISION MAKING: Stable/uncomplicated ?  ?EVALUATION COMPLEXITY: Low ?  ?GOALS: ?Goals reviewed with patient? Yes ?  ?LONG TERM GOALS: ?  ?Pt will be ind with self MLD for the Rt breast ?Baseline: needs reeducation ?Target date: 05/25/2021 ?Goal status: MET ?  ?2.  Pt will be ind with home stretches post radiation to decrease stiffness ?Baseline: no HEP ?Target date: 05/25/21    ?Goal status: MET ?  ?PLAN: ?PT FREQUENCY: 2x/week ?  ?  PT DURATION: 2 weeks ?  ?PLANNED INTERVENTIONS: Therapeutic exercises, Patient/Family education, Joint mobilization, Manual lymph drainage, and Manual therapy ?  ?PLAN FOR NEXT SESSION: cont Rt breast MLD and review prn.   ? ? ? ?Stark Bray, PT ?06/18/2021, 10:18 PM ? ?  ? ?

## 2021-06-19 ENCOUNTER — Ambulatory Visit: Payer: 59

## 2021-06-19 DIAGNOSIS — Z483 Aftercare following surgery for neoplasm: Secondary | ICD-10-CM

## 2021-06-19 DIAGNOSIS — R293 Abnormal posture: Secondary | ICD-10-CM

## 2021-06-19 DIAGNOSIS — M25611 Stiffness of right shoulder, not elsewhere classified: Secondary | ICD-10-CM

## 2021-06-19 DIAGNOSIS — C50411 Malignant neoplasm of upper-outer quadrant of right female breast: Secondary | ICD-10-CM | POA: Diagnosis not present

## 2021-06-19 DIAGNOSIS — Z171 Estrogen receptor negative status [ER-]: Secondary | ICD-10-CM

## 2021-06-19 NOTE — Therapy (Signed)
?OUTPATIENT PHYSICAL THERAPY TREATMENT NOTE ? ? ?Patient Name: Jacqueline Adams ?MRN: [id number] ?DOB:1960-06-28, 61 y.o., female ?Today's Date: 06/19/2021 ? ?PCP: Robyne Peers, MD ?REFERRING PROVIDER:Vinay [name] MD ? ? PT End of Session - 06/19/21 0905   ? ? Visit Number 23   ? Number of Visits 24   ? Date for PT Re-Evaluation 06/21/21   ? Authorization - Visit Number 23   ? Authorization - Number of Visits 60   ? PT Start Time 0902   ? PT Stop Time [phone]   ? PT Time Calculation (min) 56 min   ? Activity Tolerance Patient tolerated treatment well   ? Behavior During Therapy [facility] for tasks assessed/performed   ? ?  ?  ? ?  ? ? ? ? ? ? ?Past Medical History:  ?Diagnosis Date  ? BRCA2 gene mutation positive in female   ? Breast cancer ([place]) 01/2020  ? right breast IDC  ? Bronchitis   ? Cervical spondylarthritis   ? Headache(784.0)   ? High cholesterol   ? Hypertension   ? Leukocytosis 2009  ? Multiple sclerosis ([place]) 2009  ? followed by Dr. Erling Cruz  ? Neuromuscular disorder ([place])   ? Pneumonia   ? Rhinitis   ? Seasonal allergies   ? Sinusitis   ? ?Past Surgical History:  ?Procedure Laterality Date  ? ABDOMINAL HYSTERECTOMY  2006  ? uterus, cervix, portion of fallopian tubes for AUB  ? BREAST LUMPECTOMY WITH RADIOACTIVE SEED AND SENTINEL LYMPH NODE BIOPSY Right 08/08/2020  ? Procedure: RIGHT BREAST LUMPECTOMY WITH RADIOACTIVE SEED X 3 AND SENTINEL LYMPH NODE BIOPSY;  Surgeon: Stark Klein, MD;  Location: [place];  Service: General;  Laterality: Right;  ? CESAREAN SECTION    ? x 3  ? CORRECTION HAMMER TOE Bilateral   ? PORT-A-CATH REMOVAL Right 09/21/2020  ? Procedure: REMOVAL PORT-A-CATH;  Surgeon: Stark Klein, MD;  Location: [place];  Service: General;  Laterality: Right;  ? PORTACATH PLACEMENT Right 02/22/2020  ? Procedure: RIGHT SUBCLAVIAN VEIN PORT PLACEMENT;  Surgeon: Donnie Mesa, MD;  Location: [place];  Service: General;  Laterality: Right;  ? ROBOTIC ASSISTED BILATERAL SALPINGO OOPHERECTOMY  Bilateral 01/30/2021  ? Procedure: XI ROBOTIC ASSISTED BILATERAL SALPINGO OOPHORECTOMY, LYSIS OF ADHESIONS, 1 HOUR;  Surgeon: Lafonda Mosses, MD;  [facility];  Service: Gynecology;  Laterality: Bilateral;  ? ?Patient Active Problem List  ? Diagnosis Date Noted  ? Bilateral ovarian cysts   ? Malignant neoplasm of upper-outer quadrant of right breast in female, estrogen receptor negative ([place]) 01/10/2020  ? High risk medication use 03/25/2019  ? Microcytic anemia 12/31/2017  ? 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 UACS 12/16/2017  ? History of pneumonia 11/12/2017  ? Morbid obesity ([place]) 08/19/2016  ? Numbness 05/09/2015  ? Other fatigue 05/09/2015  ? Arthralgia of hip 11/16/2014  ? Low back pain with sciatica 11/16/2014  ? Urinary frequency 11/16/2014  ? Encounter for general adult medical examination without abnormal findings 06/07/2014  ? DOE (dyspnea on exertion) 04/27/2014  ? Atypical chest pain 04/27/2014  ? Dyslipidemia 04/27/2014  ? HLD (hyperlipidemia) 02/14/2014  ? Cervical spondylosis without myelopathy 07/28/2013  ? Cervical pain 07/28/2013  ? CFIDS (chronic fatigue and immune dysfunction syndrome) ([place]) 07/28/2013  ? Chronic infection of sinus 07/28/2013  ? Headache, migraine 07/28/2013  ? 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 Spasms of the hands or feet 07/28/2013  ? DDD (degenerative disc disease), cervical 07/28/2013  ? Menopausal symptom 07/28/2013  ?  Taking drug for chronic disease 07/28/2013  ? Vitamin D deficiency 07/28/2013  ? Degeneration of intervertebral disc of cervical region 07/28/2013  ? Cough 04/06/2013  ? BRCA gene mutation positive in female 06/12/2011  ? Genetic susceptibility to malignant neoplasm of breast 06/12/2011  ? Multiple sclerosis ([place])   ? Leukocytosis   ? ? ?REFERRING DIAG: Right breast Cancer ? ?THERAPY DIAG:  ?Malignant neoplasm of upper-outer quadrant of right breast in female, estrogen receptor negative ([place]) ? ?Stiffness of right shoulder, not elsewhere  classified ? ?Abnormal posture ? ?Aftercare following surgery for neoplasm ? 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 She underwent neoadjuvant chemotherapy 02/23/2020 - 07/05/2020 followed by a right lumpectomy and sentinel node biopsy (3 negative nodes) on 08/08/2020. Completed radiation.  She has relapsing remitting multiple sclerosis since 2009 ? ?PRECAUTIONS: Right UE lymphedema risk ? ?SUBJECTIVE: nothing new ? ?PAIN: Are you having pain? No, but sometimes tender under the arm ? ?  ? TODAY'S TREATMENT  ?06/19/21: ?PT performed MLD to the Rt breast with pt permission: In supine: Short neck, 5 diaphragmatic breaths, bil axillary nodes and establishment of interaxillary pathway, R inguinal nodes and establishment of axilloinguinal pathway, then R breast moving fluid towards pathways spending extra time in any areas of fibrosis then retracing all steps. ?STM to lateral breast with scar mobilization and fibrosis deeper pressure ? ?06/14/21 ?PT performed MLD to the Rt breast with pt permission: In supine: Short neck, 5 diaphragmatic breaths, bil axillary nodes and establishment of interaxillary pathway, R inguinal nodes and establishment of axilloinguinal pathway, then R breast moving fluid towards pathways spending extra time in any areas of fibrosis then retracing all steps. ?STM to lateral breast with scar mobilization and fibrosis deeper pressure  ?PROM right shoulder with MFR to the axilla flexion, scaption, abd, ER ? ?06/12/21 ?PT performed MLD to the Rt breast with pt permission: In supine: Short neck, 5 diaphragmatic breaths, bil axillary nodes and establishment of interaxillary pathway, R inguinal nodes and establishment of axilloinguinal pathway, then R breast moving fluid towards pathways spending extra time in any areas of fibrosis then retracing all steps. ?STM to lateral breast with scar mobilization and fibrosis  deeper pressure  ?PROM right shoulder with MFR to the axilla flexion, scaption, abd, ER ? ? ?PATIENT EDUCATION:  ?Education details: supine scapular series with yellow band ?Person educated: Patient ?Education method: Explanation, Demonstration, Tactile cues, Verbal cues, and Handouts ?Education comprehension: verbalized understanding, returned demonstration, verbal cues required, tactile cues required, and needs further education ?  ? HOME EXERCISE PROGRAM: ?Self MLD - see bottom of note, Supine scapular series with yellow band ?  ?ASSESSMENT: ? ?Right Upper Extremity Lymphedema    ?  ?09/07/20  05/09/21 ?10 cm Proximal to Olecranon Process ?34.5 cm   35 ?  ?Olecranon Process ?29.1 cm   30 ?  ?10 cm Proximal to Ulnar Styloid Process ?24.8 cm   25 ?  ?Just Proximal to Ulnar Styloid Process ?17.3 cm   17.7 ?  ?Across Hand at [employer] ?20.6 cm   20 ?  ?At [place] of 2nd Digit ?6.6 cm   6.6 ? ?  ?CLINICAL IMPRESSION: ?Pt continues with fibrotic Rt breast that improves with PT sessions and MT.  She will be ready for D/C next session.  ? ?OBJECTIVE IMPAIRMENTS increased fascial restrictions and impaired sensation.  ?  ?ACTIVITY LIMITATIONS  none .  ?PERSONAL FACTORS Fitness and  1-2 comorbidities: SLNB, radiation  are also affecting patient's functional outcome.  ?  ?  ?REHAB POTENTIAL: Excellent ?  ?CLINICAL DECISION MAKING: Stable/uncomplicated ?  ?EVALUATION COMPLEXITY: Low ?  ?GOALS: ?Goals reviewed with patient? Yes ?  ?LONG TERM GOALS: ?  ?Pt will be ind with self MLD for the Rt breast ?Baseline: needs reeducation ?Target date: 05/25/2021 ?Goal status: MET ?  ?2.  Pt will be ind with home stretches post radiation to decrease stiffness ?Baseline: no HEP ?Target date: 05/25/21    ?Goal status: MET ?  ?PLAN: ?PT FREQUENCY: 2x/week ?  ?PT DURATION: 2 weeks ?  ?PLANNED INTERVENTIONS: Therapeutic exercises, Patient/Family education, Joint mobilization, Manual lymph drainage, and Manual therapy ?  ?PLAN FOR NEXT SESSION:  cont Rt breast MLD and D/C next.   ? ? ? ?Otelia Limes, PTA ?06/19/2021, 10:01 AM ? ?  ? ?

## 2021-06-21 ENCOUNTER — Encounter: Payer: Self-pay | Admitting: Rehabilitation

## 2021-06-21 ENCOUNTER — Ambulatory Visit: Payer: 59 | Admitting: Rehabilitation

## 2021-06-21 DIAGNOSIS — Z483 Aftercare following surgery for neoplasm: Secondary | ICD-10-CM

## 2021-06-21 DIAGNOSIS — R293 Abnormal posture: Secondary | ICD-10-CM

## 2021-06-21 DIAGNOSIS — C50411 Malignant neoplasm of upper-outer quadrant of right female breast: Secondary | ICD-10-CM

## 2021-06-21 DIAGNOSIS — M25611 Stiffness of right shoulder, not elsewhere classified: Secondary | ICD-10-CM

## 2021-06-21 NOTE — Therapy (Signed)
?OUTPATIENT PHYSICAL THERAPY TREATMENT NOTE ? ? ?Patient Name: Jacqueline Adams ?MRN: [id number] ?DOB:1960-08-05, 61 y.o., female ?Today's Date: 06/21/2021 ? ?PCP: Robyne Peers, MD ?REFERRING PROVIDER:Vinay [name] MD ? ? PT End of Session - 06/21/21 0903   ? ? Visit Number 24   ? Number of Visits 24   ? Date for PT Re-Evaluation 06/21/21   ? PT Start Time [phone]   ? PT Stop Time [id number]   ? PT Time Calculation (min) 39 min   ? Activity Tolerance Patient tolerated treatment well   ? Behavior During Therapy [facility] for tasks assessed/performed   ? ?  ?  ? ?  ? ? ? ? ? ? ?Past Medical History:  ?Diagnosis Date  ? BRCA2 gene mutation positive in female   ? Breast cancer ([place]) 01/2020  ? right breast IDC  ? Bronchitis   ? Cervical spondylarthritis   ? Headache(784.0)   ? High cholesterol   ? Hypertension   ? Leukocytosis 2009  ? Multiple sclerosis ([place]) 2009  ? followed by Dr. Erling Cruz  ? Neuromuscular disorder ([place])   ? Pneumonia   ? Rhinitis   ? Seasonal allergies   ? Sinusitis   ? ?Past Surgical History:  ?Procedure Laterality Date  ? ABDOMINAL HYSTERECTOMY  2006  ? uterus, cervix, portion of fallopian tubes for AUB  ? BREAST LUMPECTOMY WITH RADIOACTIVE SEED AND SENTINEL LYMPH NODE BIOPSY Right 08/08/2020  ? Procedure: RIGHT BREAST LUMPECTOMY WITH RADIOACTIVE SEED X 3 AND SENTINEL LYMPH NODE BIOPSY;  Surgeon: Stark Klein, MD;  Location: [place];  Service: General;  Laterality: Right;  ? CESAREAN SECTION    ? x 3  ? CORRECTION HAMMER TOE Bilateral   ? PORT-A-CATH REMOVAL Right 09/21/2020  ? Procedure: REMOVAL PORT-A-CATH;  Surgeon: Stark Klein, MD;  Location: [place];  Service: General;  Laterality: Right;  ? PORTACATH PLACEMENT Right 02/22/2020  ? Procedure: RIGHT SUBCLAVIAN VEIN PORT PLACEMENT;  Surgeon: Donnie Mesa, MD;  Location: [place];  Service: General;  Laterality: Right;  ? ROBOTIC ASSISTED BILATERAL SALPINGO OOPHERECTOMY Bilateral 01/30/2021  ? Procedure: XI ROBOTIC ASSISTED BILATERAL SALPINGO  OOPHORECTOMY, LYSIS OF ADHESIONS, 1 HOUR;  Surgeon: Lafonda Mosses, MD;  Location: [facility];  Service: Gynecology;  Laterality: Bilateral;  ? ?Patient Active Problem List  ? Diagnosis Date Noted  ? Bilateral ovarian cysts   ? Malignant neoplasm of upper-outer quadrant of right breast in female, estrogen receptor negative ([place]) 01/10/2020  ? High risk medication use 03/25/2019  ? Microcytic anemia 12/31/2017  ? 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 UACS 12/16/2017  ? History of pneumonia 11/12/2017  ? Morbid obesity ([place]) 08/19/2016  ? Numbness 05/09/2015  ? Other fatigue 05/09/2015  ? Arthralgia of hip 11/16/2014  ? Low back pain with sciatica 11/16/2014  ? Urinary frequency 11/16/2014  ? Encounter for general adult medical examination without abnormal findings 06/07/2014  ? DOE (dyspnea on exertion) 04/27/2014  ? Atypical chest pain 04/27/2014  ? Dyslipidemia 04/27/2014  ? HLD (hyperlipidemia) 02/14/2014  ? Cervical spondylosis without myelopathy 07/28/2013  ? Cervical pain 07/28/2013  ? CFIDS (chronic fatigue and immune dysfunction syndrome) ([place]) 07/28/2013  ? Chronic infection of sinus 07/28/2013  ? Headache, migraine 07/28/2013  ? DS (disseminated sclerosis) ([place]) 07/28/2013  ? Spasms of the hands or feet 07/28/2013  ? DDD (degenerative disc disease), cervical 07/28/2013  ? Menopausal symptom 07/28/2013  ? Taking drug for chronic disease 07/28/2013  ? Vitamin D deficiency 07/28/2013  ? Degeneration  of intervertebral disc of cervical region 07/28/2013  ? Cough 04/06/2013  ? BRCA gene mutation positive in female 06/12/2011  ? Genetic susceptibility to malignant neoplasm of breast 06/12/2011  ? Multiple sclerosis ([place])   ? Leukocytosis   ? ? ?REFERRING DIAG: Right breast Cancer ? ?THERAPY DIAG:  ?Malignant neoplasm of upper-outer quadrant of right breast in female, estrogen receptor negative ([place]) ? ?Stiffness of right shoulder, not elsewhere classified ? ?Abnormal posture ? ?Aftercare following surgery for  neoplasm ? 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 She underwent neoadjuvant chemotherapy 02/23/2020 - 07/05/2020 followed by a right lumpectomy and sentinel node biopsy (3 negative nodes) on 08/08/2020. Completed radiation.  She has relapsing remitting multiple sclerosis since 2009 ? ?PRECAUTIONS: Right UE lymphedema risk ? ?SUBJECTIVE:I am ready for DC! ? ?PAIN: Are you having pain? No, but sometimes tender under the arm ? ?  ? TODAY'S TREATMENT  ?06/21/21: ?PT performed MLD to the Rt breast with pt permission: In supine: Short neck, 5 diaphragmatic breaths, bil axillary nodes and establishment of interaxillary pathway, R inguinal nodes and establishment of axilloinguinal pathway, then R breast moving fluid towards pathways spending extra time in any areas of fibrosis then retracing all steps. ?STM to lateral breast with scar mobilization and fibrosis deeper pressure ? ?06/19/21: ?PT performed MLD to the Rt breast with pt permission: In supine: Short neck, 5 diaphragmatic breaths, bil axillary nodes and establishment of interaxillary pathway, R inguinal nodes and establishment of axilloinguinal pathway, then R breast moving fluid towards pathways spending extra time in any areas of fibrosis then retracing all steps. ?STM to lateral breast with scar mobilization and fibrosis deeper pressure ? ?06/14/21 ?PT performed MLD to the Rt breast with pt permission: In supine: Short neck, 5 diaphragmatic breaths, bil axillary nodes and establishment of interaxillary pathway, R inguinal nodes and establishment of axilloinguinal pathway, then R breast moving fluid towards pathways spending extra time in any areas of fibrosis then retracing all steps. ?STM to lateral breast with scar mobilization and fibrosis deeper pressure  ?PROM right shoulder with MFR to the axilla flexion, scaption, abd, ER ? ?PATIENT EDUCATION:  ?Education details:  supine scapular series with yellow band ?Person educated: Patient ?Education method: Explanation, Demonstration, Tactile cues, Verbal cues, and Handouts ?Education comprehension: verbalized understanding, returned demonstration, verbal cues required, tactile cues required, and needs further education ?  ? HOME EXERCISE PROGRAM: ?Self MLD - see bottom of note, Supine scapular series with yellow band ?  ?ASSESSMENT: ? ?Right Upper Extremity Lymphedema    ?  ?09/07/20  05/09/21 ?10 cm Proximal to Olecranon Process ?34.5 cm   35 ?  ?Olecranon Process ?29.1 cm   30 ?  ?10 cm Proximal to Ulnar Styloid Process ?24.8 cm   25 ?  ?Just Proximal to Ulnar Styloid Process ?17.3 cm   17.7 ?  ?Across Hand at [employer] ?20.6 cm   20 ?  ?At [place] of 2nd Digit ?6.6 cm   6.6 ? ?  ?CLINICAL IMPRESSION: ?Pt continues with fibrotic Rt breast that improves with PT sessions and MT.  She has met all her goals and is ind with final HEP.  Pt knows to return if needed.  KNows that the seroma scar tissue may soften or may stay as is.   ? ?OBJECTIVE IMPAIRMENTS increased fascial restrictions and impaired sensation.  ?  ?ACTIVITY LIMITATIONS  none .  ?PERSONAL FACTORS Fitness and 1-2  comorbidities: SLNB, radiation  are also affecting patient's functional outcome.  ?  ?  ?REHAB POTENTIAL: Excellent ?  ?CLINICAL DECISION MAKING: Stable/uncomplicated ?  ?EVALUATION COMPLEXITY: Low ?  ?GOALS: ?Goals reviewed with patient? Yes ?  ?LONG TERM GOALS: ?  ?Pt will be ind with self MLD for the Rt breast ?Baseline: needs reeducation ?Target date: 05/25/2021 ?Goal status: MET ?  ?2.  Pt will be ind with home stretches post radiation to decrease stiffness ?Baseline: no HEP ?Target date: 05/25/21    ?Goal status: MET ?  ?PLAN: ?PT FREQUENCY: 2x/week ?  ?PT DURATION: 2 weeks ?  ?PLANNED INTERVENTIONS: Therapeutic exercises, Patient/Family education, Joint mobilization, Manual lymph drainage, and Manual therapy ?  ?PLAN FOR NEXT SESSION: cont Rt breast MLD and  D/C next.   ? ?PHYSICAL THERAPY DISCHARGE SUMMARY ? ?Visits from Start of Care: 24 ? ?Current functional level related to goals / functional outcomes: ?See above ?  ?Remaining deficits: ?Breast lymphedema ?  ?Education /

## 2021-06-26 ENCOUNTER — Other Ambulatory Visit: Payer: Self-pay | Admitting: Hematology and Oncology

## 2021-07-05 ENCOUNTER — Inpatient Hospital Stay: Payer: 59

## 2021-07-05 ENCOUNTER — Inpatient Hospital Stay: Payer: 59 | Admitting: Pharmacist

## 2021-07-05 VITALS — BP 126/60 | HR 79 | Temp 97.5°F | Resp 16 | Ht 64.0 in | Wt 229.8 lb

## 2021-07-05 DIAGNOSIS — C50411 Malignant neoplasm of upper-outer quadrant of right female breast: Secondary | ICD-10-CM | POA: Diagnosis not present

## 2021-07-05 LAB — CBC WITH DIFFERENTIAL (CANCER CENTER ONLY)
Abs Immature Granulocytes: 0.02 10*3/uL (ref 0.00–0.07)
Basophils Absolute: 0.1 10*3/uL (ref 0.0–0.1)
Basophils Relative: 1 %
Eosinophils Absolute: 0.3 10*3/uL (ref 0.0–0.5)
Eosinophils Relative: 3 %
HCT: 36.3 % (ref 36.0–46.0)
Hemoglobin: 12.3 g/dL (ref 12.0–15.0)
Immature Granulocytes: 0 %
Lymphocytes Relative: 27 %
Lymphs Abs: 2.2 10*3/uL (ref 0.7–4.0)
MCH: 28.3 pg (ref 26.0–34.0)
MCHC: 33.9 g/dL (ref 30.0–36.0)
MCV: 83.6 fL (ref 80.0–100.0)
Monocytes Absolute: 0.5 10*3/uL (ref 0.1–1.0)
Monocytes Relative: 6 %
Neutro Abs: 5 10*3/uL (ref 1.7–7.7)
Neutrophils Relative %: 63 %
Platelet Count: 248 10*3/uL (ref 150–400)
RBC: 4.34 MIL/uL (ref 3.87–5.11)
RDW: 18.7 % — ABNORMAL HIGH (ref 11.5–15.5)
WBC Count: 8 10*3/uL (ref 4.0–10.5)
nRBC: 0.2 % (ref 0.0–0.2)

## 2021-07-05 LAB — CMP (CANCER CENTER ONLY)
ALT: 16 U/L (ref 0–44)
AST: 18 U/L (ref 15–41)
Albumin: 4 g/dL (ref 3.5–5.0)
Alkaline Phosphatase: 86 U/L (ref 38–126)
Anion gap: 8 (ref 5–15)
BUN: 7 mg/dL (ref 6–20)
CO2: 27 mmol/L (ref 22–32)
Calcium: 9.1 mg/dL (ref 8.9–10.3)
Chloride: 107 mmol/L (ref 98–111)
Creatinine: 0.76 mg/dL (ref 0.44–1.00)
GFR, Estimated: >60 mL/min (ref >60)
Glucose, Bld: 121 mg/dL — ABNORMAL HIGH (ref 70–99)
Potassium: 3.5 mmol/L (ref 3.5–5.1)
Sodium: 142 mmol/L (ref 135–145)
Total Bilirubin: 0.5 mg/dL (ref 0.3–1.2)
Total Protein: 7.3 g/dL (ref 6.5–8.1)

## 2021-07-05 NOTE — Progress Notes (Signed)
[place]       Telephone: [phone]?Fax: [phone]   Oncology Clinical Pharmacist Practitioner Progress Note  VAISHNAVI DALBY was contacted via in-person visit to discuss her chemotherapy regimen for olaparib which they receive under the care of Dr. Nicholas Lose.    Current treatment regimen and start date Olaparib (03/03/21) -- increased to 300 mg every 12 hours on 05/06/21   Interval History She continues on olaparib 300 mg by mouth  every 12 hours on days 1 to 28 of a 28-day cycle. This is being given  monotherapy. Therapy is planned to continue until  one year in the adjuvant setting per the [place] trial data .   Response to Therapy Ms. Furgerson was seen today by clinical pharmacy as a follow-up to her olaparib management.  We last saw her on 06/11/21 and she last saw Dr. [name] on 05/14/21.  Overall, she is tolerating the olaparib fairly well.  Her main complaint today is that of continued fatigue and the continued headaches that she reported at her last visit.  The headaches continue to be consistent in nature and she describes these headaches as different than migraines that she had in the past.  She feels the headaches may have started when she increased the olaparib on 05/06/21.  She feels there may have been a slight improvement since her last visit but they remain constant. Out of an abundance of caution, as we discussed at her last visit, we will get imaging consisting of an MRI of the brain.  We have put these orders in for next week and we will update the visit with Dr. [name] that was initially scheduled for 08/15/21 and make that appointment for next Friday (07/13/21).  We will also remove the lab component of that visit since she did have labs today.  We again discussed today that incidence from olaparib with regards to headache is as high as 20% in the literature.    With regards to her fatigue, we also explained that olaparib has incidence of fatigue as high  as 40% all grades and around 2% for grade 3 or higher.  She works from home and states that after work she is very tired.  She also reports severe seasonal allergies. She states that she is getting plenty of good sleep at night.  She is also resting when able.  She states that she is eating a well-balanced diet and exercising when able.  We discussed that moderate intensity, 30 minutes most days of the week would be ideal if possible.  We did discuss that in most severe cases of fatigue, we could consider medicinal approaches, but she is not interested in pursuing these options at this time.   She continues on Avonex for MS which is managed by Dr. Arlice Colt from [facility]. She has an appointment with their office on 08/08/21.  Clinical pharmacy will plan on seeing Ms. Duquette in mid June with labs.  Since she has been tolerating the olaparib very well from a lab parameter perspective, we could consider extending her labs out to every 2 months if Dr. [name] agrees.  Labs, vitals, treatment parameters, and manufacturer guidelines assessing toxicity were reviewed with [name] today. Based on these values, patient is in agreement to continue olaparib therapy at this time.  Allergies Allergies  Allergen Reactions   Nabumetone Swelling and Hives   Procaine Swelling   Shellfish Allergy Anaphylaxis    Vitals  07/05/2021    9:03 AM 06/11/2021   10:01 AM 05/28/2021   10:00 AM  Vitals with BMI  Height '5\' 4"'$  '5\' 4"'$    Weight 229 lbs 13 oz 231 lbs 8 oz   BMI 96.29 52.84   Systolic 132 440 102  Diastolic 60 66 70  Pulse 79 89      Laboratory Data    Latest Ref Rng & Units 07/05/2021    8:35 AM 06/11/2021    9:12 AM 05/14/2021    9:18 AM  CBC EXTENDED  WBC 4.0 - 10.5 K/uL 8.0   10.9   9.6    RBC 3.87 - 5.11 MIL/uL 4.34   4.32   4.58    Hemoglobin 12.0 - 15.0 g/dL 12.3   12.0   12.2    HCT 36.0 - 46.0 % 36.3   35.4   37.0    Platelets 150 - 400 K/uL 248   241   253     NEUT# 1.7 - 7.7 K/uL 5.0   8.0   6.6    Lymph# 0.7 - 4.0 K/uL 2.2   2.3   2.4         Latest Ref Rng & Units 07/05/2021    8:35 AM 06/11/2021    9:12 AM 05/14/2021    9:18 AM  CMP  Glucose 70 - 99 mg/dL 121   161   136    BUN 6 - 20 mg/dL '7   7   8    '$ Creatinine 0.44 - 1.00 mg/dL 0.76   0.73   0.71    Sodium 135 - 145 mmol/L 142   142   142    Potassium 3.5 - 5.1 mmol/L 3.5   3.6   3.5    Chloride 98 - 111 mmol/L 107   106   106    CO2 22 - 32 mmol/L '27   31   29    '$ Calcium 8.9 - 10.3 mg/dL 9.1   9.1   8.8    Total Protein 6.5 - 8.1 g/dL 7.3   7.0   7.2    Total Bilirubin 0.3 - 1.2 mg/dL 0.5   0.4   0.5    Alkaline Phos 38 - 126 U/L 86   79   77    AST 15 - 41 U/L '18   18   19    '$ ALT 0 - 44 U/L '16   17   18      '$ Lab Results  Component Value Date   MG 1.8 05/22/2020     Adverse Effects Assessment Headaches: Ms. Ohnemus feels that slightly improved but they remain consistent.  Out of an abundance of caution, as above, we will obtain an MRI of the brain.  These orders have been put in for early next week.  She will then see Dr. [name] to review these results later next week.   Fatigue: As above, Ms. Zurn works from home and after work feels extremely tired.  She reports eating a well-balanced diet consisting of fruits and vegetables.  She also states that she has been exercising.  She is also getting quality sleep consisting of 8 or more hours.  We will continue to monitor this.  Options would include lowering the dose of olaparib.  Ms. Boyden is not interested in any medicinal agents at this time that have been used to treat fatigue per the NCCN guidelines.  Adherence Assessment Nakiyah L Robar reports missing  0 doses over the past 3 weeks.   Reason for missed dose: N/A Patient was re-educated on importance of adherence.   Access Assessment DYANA MAGNER is currently receiving her olaparib through [employer] concerns: None  Medication  Reconciliation The patient's medication list was reviewed today with the patient?  Yes New medications or herbal supplements have recently been started?  No Any medications have been discontinued?  No The medication list was updated and reconciled based on the patient's most recent medication list in the electronic medical record (EMR) including herbal products and OTC medications.   Medications Current Outpatient Medications  Medication Sig Dispense Refill   AVONEX PREFILLED 30 MCG/0.5ML PSKT injection INJECT ONE SYRINGE INTRAMUSCULARLY ONCE WEEKLY. REFRIGERATE. PROTECT FROM LIGHT. ALLOW TO WARM TO ROOM TEMPERATURE PRIOR TO USE. 3 each 3   Calcium Carb-Cholecalciferol (CALCIUM 600+D3 PO) Take 1 tablet by mouth in the morning and at bedtime.     ELDERBERRY PO Take by mouth. Patient report taking 2 gummies daily     Ferrous Sulfate Dried (SLOW RELEASE IRON) 45 MG TBCR Take 45 mg by mouth in the morning.     fexofenadine (ALLEGRA) 180 MG tablet Take 180 mg by mouth daily as needed for allergies.     fluticasone (FLONASE) 50 MCG/ACT nasal spray Place 2 sprays into both nostrils daily. (Patient taking differently: Place 2 sprays into both nostrils daily as needed for allergies.) 16 g 2   ibuprofen (ADVIL) 200 MG tablet Take 400 mg by mouth every Friday. W/Avonex injection     KLOR-CON M20 20 MEQ tablet TAKE 1 TABLET BY MOUTH EVERY DAY IN THE MORNING 30 tablet 1   meclizine (ANTIVERT) 25 MG tablet Take 1 tablet (25 mg total) by mouth 3 (three) times daily as needed for dizziness. 30 tablet 0   metoprolol succinate (TOPROL-XL) 25 MG 24 hr tablet Take 25 mg by mouth daily.     Multiple Vitamin (MULTIVITAMIN WITH MINERALS) TABS tablet Take 1 tablet by mouth every evening. Centrum     olaparib (LYNPARZA) 150 MG tablet Take 2 tablets (300 mg total) by mouth 2 (two) times daily. Swallow whole. May take with food to decrease nausea and vomiting. 120 tablet 5   Omega-3 Fatty Acids (FISH OIL) 1000 MG CPDR  Take 1,000 mg by mouth daily.     simvastatin (ZOCOR) 20 MG tablet Take 20 mg by mouth every evening.     VENTOLIN HFA 108 (90 BASE) MCG/ACT inhaler Inhale 1-2 puffs into the lungs every 6 (six) hours as needed for shortness of breath or wheezing.     No current facility-administered medications for this visit.    Drug-Drug Interactions (DDIs) DDIs were evaluated?  Yes Significant DDIs?  No The patient was instructed to speak with their health care provider and/or the oral chemotherapy pharmacist before starting any new drug, including prescription or over the counter, natural / herbal products, or vitamins.  Supportive Care Fever: reviewed the importance of having a thermometer and the Centers for Disease Control and Prevention (CDC) definition of fever which is 100.65F (38C) or higher. Patient should call 24/7 triage at [phone] if experiencing a fever or any other symptoms N/V/D: minimal nausea. Not taking anti-nausea medications at this time Fatigue: will continue to monitor. May reduce dose of olaparib if needed. Ms. Threats not interested in medicinal products that treat fatigue Headache: OTC pain medications help some but headache is consistent. Imaging ordered.  Dosing Assessment Hepatic adjustments needed? No  Renal adjustments needed? No  Toxicity adjustments needed? No  The current dosing regimen is appropriate to continue at this time.  Follow-Up Plan MRI of brain ordered. To follow up with results via Dr. [name] visit next week.  Continue olaparib 300 mg twice daily. Should finish 03/03/22. Has refills on hand at [facility] clinical pharmacy with labs on 08/23/21 Continue to follow with Dr. Felecia Shelling for [place] participated in the discussion, expressed understanding, and voiced agreement with the above plan. All questions were answered to her satisfaction. The patient was advised to contact the clinic at [phone] with any questions or  concerns prior to her return visit.   I spent 30 minutes assessing and educating the patient.  Raina Mina, RPH-CPP, 07/05/2021  9:30 AM   **Disclaimer: This note was dictated with voice recognition software. Similar sounding words can inadvertently be transcribed and this note may contain transcription errors which may not have been corrected upon publication of note.**

## 2021-07-06 ENCOUNTER — Telehealth: Payer: Self-pay | Admitting: Pharmacist

## 2021-07-06 NOTE — Telephone Encounter (Signed)
Scheduled appointments per 05/25 los. Patient aware.

## 2021-07-10 ENCOUNTER — Inpatient Hospital Stay: Payer: 59

## 2021-07-10 ENCOUNTER — Ambulatory Visit (HOSPITAL_COMMUNITY)
Admission: RE | Admit: 2021-07-10 | Discharge: 2021-07-10 | Disposition: A | Discharge status: Home / Self Care | Payer: 59 | Source: Ambulatory Visit | Attending: Hematology and Oncology | Admitting: Hematology and Oncology

## 2021-07-10 ENCOUNTER — Inpatient Hospital Stay: Payer: 59 | Admitting: Pharmacist

## 2021-07-10 DIAGNOSIS — C50411 Malignant neoplasm of upper-outer quadrant of right female breast: Secondary | ICD-10-CM | POA: Diagnosis present

## 2021-07-10 DIAGNOSIS — Z171 Estrogen receptor negative status [ER-]: Secondary | ICD-10-CM | POA: Diagnosis present

## 2021-07-10 MED ORDER — GADOBUTROL 1 MMOL/ML IV SOLN
10.0000 mL | Freq: Once | INTRAVENOUS | Status: AC | PRN
  Administered 2021-07-10: 10 mL via INTRAVENOUS

## 2021-07-12 ENCOUNTER — Inpatient Hospital Stay: Payer: 59 | Attending: Hematology and Oncology | Admitting: Hematology and Oncology

## 2021-07-12 DIAGNOSIS — C50411 Malignant neoplasm of upper-outer quadrant of right female breast: Secondary | ICD-10-CM | POA: Diagnosis not present

## 2021-07-12 DIAGNOSIS — Z171 Estrogen receptor negative status [ER-]: Secondary | ICD-10-CM | POA: Diagnosis not present

## 2021-07-12 NOTE — Assessment & Plan Note (Addendum)
01/10/2020:Palpable right breast mass. Mammogram showed a 1.8cm mass at the 10:30 position 2cm from the nipple, a 1.5cm mass at the 10:30 position 7cm from the nipple, and indeterminate calcifications between the two masses in the right breast. 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 BRCA2Mutation  Treatment plan: 1. Neoadjuvant chemotherapy with Adriamycin and Cytoxan every2weeks4 followed by Taxol weekly 12 with carboplatinand every 3 weeks x4completed 07/05/2020 2.6/28/22breast conserving surgery with sentinel lymph node study : scattered foci of DCIS, margins Neg, 0/3 LN Neg 3.Adjuvant radiation therapy10/12/22- 12/18/20 4.Adjuvant olaparibstarted 02/20/21 [facility] nausea study  Breast MRI12/04/2019: 2 cm spiculated mass UOQ right breast, 2.1 cm spiculated mass UOQ right breast, biopsy-proven malignancies. 7 mm suspicious enhancing mass: Biopsy 02/02/2020: High-grade DCIS ----------------------------------------------------------------------------------------------------------------------------------------------- Olaparib: Lynparza is a PARP inhibitor started 02/20/2021 (1 year of total therapy)  [place] trial showed that the recurrence rate improves with olaparib. 86% versus 77%. Dose: 300 mg twice daily Adverse effects:  1.  Very mild nausea 2. fatigue  Breast cancer surveillance: Breast MRI will be done in August.  Mammograms were done in 04/03/2012 she hadprophylactic oophorectomy.  Brain MRI: 07/11/21: Findings of MS. No brain mets  Return to clinic in every 2 months 

## 2021-07-12 NOTE — Progress Notes (Signed)
HEMATOLOGY-ONCOLOGY TELEPHONE VISIT PROGRESS NOTE  I connected with Mrs. Tegtmeyer on 07/12/21 at  8:30 AM EDT by telephone and verified that I am speaking with the correct person using two identifiers.  I discussed the limitations, risks, security and privacy concerns of performing an evaluation and management service by telephone and the availability of in person appointments.  I also discussed with the patient that there may be a patient responsible charge related to this service. The patient expressed understanding and agreed to proceed.   History of Present Illness: Follow-up to discuss results of the brain MRI.  The MRI was obtained for severe and recurrent headaches.  She had mild headaches at 150 p.o. twice daily dose but it has gotten markedly worse and the dose was increased to 300 p.o. twice daily.  Oncology History  Malignant neoplasm of upper-outer quadrant of right breast in female, estrogen receptor negative ([place])  04/12/2011 Genetic Testing   + BRCA 2 mutation on Q486X (1684C>T) BRCA2.    01/10/2020 Initial Diagnosis   Palpable right breast mass. Mammogram showed a 1.8cm mass at the 10:30 position 2cm from the nipple, a 1.5cm mass at the 10:30 position 7cm from the nipple, and indeterminate calcifications between the two masses in the right breast. 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    01/12/2020 Cancer Staging   Staging form: Breast, AJCC 8th Edition - Clinical stage from 01/12/2020: Stage IB (cT1c, cN0, cM0, G3, ER-, PR-, HER2-) - Signed by Nicholas Lose, MD on 02/03/2020    02/23/2020 - 07/05/2020 Chemotherapy   Patient is on Treatment Plan : BREAST Dose Dense AC q14d / CARBOplatin D1 + PACLitaxel D1,8,15 q21d      08/08/2020 Surgery   Right breast lumpectomy: scattered foci of DCIS with calcifications, intermediate to high-grade, largest measuring 0.3 cm, no residual invasive carcinoma, resection  margins negative for DCIS, and all 3 axillary nodes negative for carcinoma   11/21/2020 - 12/18/2020 Radiation Therapy   11/21/2020 through 12/18/2020 Site Technique Total Dose (Gy) Dose per Fx (Gy) Completed Fx Beam Energies  Breast, Right: Breast_Rt 3D 42.56/42.56 2.66 16/16 10X  Breast, Right: Breast_Rt_Bst 3D 8/8 2 4/4 6X, 10X    03/04/2021 Miscellaneous   Olaparib started at 122m twice daily Increased to 3049mBID 04/30/2021     REVIEW OF SYSTEMS:   Constitutional: Severe and recurrent headaches All other systems were reviewed with the patient and are negative. Observations/Objective:     Assessment Plan:  Malignant neoplasm of upper-outer quadrant of right breast in female, estrogen receptor negative ([place]/20[place]:Palpable right breast mass. Mammogram showed a 1.8cm mass at the 10:30 position 2cm from the nipple, a 1.5cm mass at the 10:30 position 7cm from the nipple, and indeterminate calcifications between the two masses in the right breast. 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  BRCA2 Mutation   Treatment plan: 1. Neoadjuvant chemotherapy with Adriamycin and Cytoxan every 2 weeks 4 followed by Taxol weekly 12 with carboplatin and  every 3 weeks x4 completed 07/05/2020 2. 08/08/20 breast conserving surgery with sentinel lymph node study : scattered foci of DCIS, margins Neg, 0/3 LN Neg 3. Adjuvant radiation therapy 11/22/20- 12/18/20 4. Adjuvant olaparib started 02/20/21 [facility]RCC nausea study   Breast MRI 01/14/2020: 2 cm spiculated mass UOQ right breast, 2.1 cm spiculated mass UOQ right breast, biopsy-proven malignancies.  7  mm suspicious enhancing mass: Biopsy 02/02/2020: High-grade DCIS ----------------------------------------------------------------------------------------------------------------------------------------------- Olaparib: Lonie Peak is a PARP inhibitor started 02/20/2021 (1 year of total  therapy)   [place] trial showed that the recurrence rate improves with olaparib.  86% versus 77%. Dose: 300 mg twice daily reduced to 150 p.o. twice daily starting 07/12/2021 (this will be her dose for the rest of the treatment)  Adverse effects:  1.  Very mild nausea 2. fatigue 3. Rec Headaches: These got severe when the dose was increased to 300 mg twice a day.  Therefore we will reduce the dosage to 150 p.o. twice daily.   Breast cancer surveillance: Breast MRI will be done in August.  Mammograms were done in 04/03/2012 she had prophylactic oophorectomy.  Brain MRI: 07/11/21: Findings of MS. No brain mets   Return to clinic in July to see [name].  After that he can see her every 2 months.   I discussed the assessment and treatment plan with the patient. The patient was provided an opportunity to ask questions and all were answered. The patient agreed with the plan and demonstrated an understanding of the instructions. The patient was advised to call back or seek an in-person evaluation if the symptoms worsen or if the condition fails to improve as anticipated.   I provided 12 minutes of non-face-to-face time during this encounter. Harriette Ohara, MD

## 2021-07-13 ENCOUNTER — Ambulatory Visit (HOSPITAL_COMMUNITY): Payer: 59

## 2021-07-28 ENCOUNTER — Other Ambulatory Visit: Payer: Self-pay | Admitting: Hematology and Oncology

## 2021-08-06 ENCOUNTER — Ambulatory Visit: Payer: 59 | Attending: Hematology and Oncology

## 2021-08-06 VITALS — Wt 225.1 lb

## 2021-08-06 DIAGNOSIS — Z483 Aftercare following surgery for neoplasm: Secondary | ICD-10-CM | POA: Insufficient documentation

## 2021-08-08 ENCOUNTER — Ambulatory Visit: Payer: 59 | Admitting: Family Medicine

## 2021-08-08 ENCOUNTER — Encounter: Payer: Self-pay | Admitting: Family Medicine

## 2021-08-08 VITALS — BP 142/83 | HR 86 | Ht 64.0 in | Wt 224.8 lb

## 2021-08-08 DIAGNOSIS — R5383 Other fatigue: Secondary | ICD-10-CM

## 2021-08-08 DIAGNOSIS — G43009 Migraine without aura, not intractable, without status migrainosus: Secondary | ICD-10-CM | POA: Diagnosis not present

## 2021-08-08 DIAGNOSIS — R208 Other disturbances of skin sensation: Secondary | ICD-10-CM

## 2021-08-08 DIAGNOSIS — E559 Vitamin D deficiency, unspecified: Secondary | ICD-10-CM

## 2021-08-08 DIAGNOSIS — Z79899 Other long term (current) drug therapy: Secondary | ICD-10-CM

## 2021-08-08 DIAGNOSIS — G35 Multiple sclerosis: Secondary | ICD-10-CM

## 2021-08-08 NOTE — Progress Notes (Signed)
PATIENT: Jacqueline Adams DOB: [birth year]  REASON FOR VISIT: follow up HISTORY FROM: patient  Chief Complaint  Patient presents with   Follow-up    Rm 1, alone. Here for 6 month MS f/u, on Avonex and tolerating well. MS stable. Currently on a chemo pill Lonie Peak). May 30th was last brain MRI, noticed no changes. Has been having HA since taking Lynparza, which is a SE. Ibup has not helped her HA.     HISTORY OF PRESENT ILLNESS:  08/08/21 ALL: Jacqueline Adams is a 61 y.o. female here today for follow up for RRMS. She continues Avonex. Labs were stable in 06/2021. Last MRI stable in 06/2021.   Overall she is doing well. Gait is stable without assistive device. She continues to have chronic fatigue and dysesthesias (mostly in hands and feet).headaches have worsened since starting oral chem med. She was previously on topiramate but unsure why this was discontinued. She continues vitamin D OTC.   She does sleep well. 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  She drinks plenty of water. She denies anxiety or depression. No changes in vision, bowel or bladder habits.    HISTORY: (copied from Dr Garth Bigness previous note)  Jacqueline Adams is a 61 y.o. woman with relapsing remitting MS diagnosed in 2009.     Update 02/06/2021: She is on Avonex and tolerates it well.    She is getting the drug through an assistance.  She tolerates it well.  No injection reactios    Gait is doing well.  No falls.   She can go downstairs without the banister.   She denies weakness or spasticity in her legs.  She has sciatica type pain shooting pain in her right leg at times but this is not worsening and not bad enough to treat.   Bladder is doing well.   Vision is fine.     She denies much fatigue.   Her sleep pattern is better since she stopped chemo.    She is walking some everyday.   She denies depression.  Cognition is doing well.     She had DCIS Stage 1B, grade 2 HER-2  negative ER/PR negative right breast cancer.    She was carboplatin and paclitaxel.   She gets shooting pain in her right hand/arm at times only.    She finished radiation 12/2020.     She had Covid-19 early December 2022  .  She had bilateral oophrectomy and lysis of adhesions surgery last week.   She did well and is recovering nice.y   She gets blood work regularly last 12/13/2020   CBC showed normal WBC and lymphocyte count.    CMP showed normal LFT     MS History:  In 2009, she had the onset of right arm and leg tingling and clumsiness. At the time, she was seeing Dr. Earley Favor for migraines and he performed a nerve conduction study only showing mild carpal tunnel syndrome. Symptoms worsened the next day and she was referred to Dr. Erling Cruz. An MRI of the brain, cervical spine and a lumbar puncture was performed. The brain MRI showed a large left frontoparietal periventricular focus and several other foci. CSF was consistent with multiple sclerosis.  MRI of the cervical spine was normal. She was started on Avonex.   She was switched to Tecfidera in late 2014 as she had one new focus on her 2014 MRI. She switched back to Avonex in 2016.[name]  Imaging MRI of the brain 04/29/2018 showed T2/FLAIR hyperintense foci in the hemispheres in a pattern consistent with demyelination/MS.  None of the foci were acute and there were no new lesions compared to the MRI from 2018.   MRI of the brain 05/22/2016 showed T2/FLAIR foci in the hemispheres consistent with MS.  There were no acute findings.  No new lesions compared to 05/14/2015   MRI of the cervical spine 05/22/2016 showed a normal spinal cord and mild disc degenerative changes but no nerve root compression or spinal stenosis.   MRO of the brain 09/19/2020 showed Several T2/FLAIR hyperintense foci in the left hemisphere in a pattern consistent with chronic demyelinating plaque associated with multiple sclerosis.  None of the foci enhance.  Compared to the MRI from  04/29/2018, there are no new lesions.   MRI cervical spine showed a normal spinal cord and DJD but no spinal stenosis or nerve root compression   REVIEW OF SYSTEMS: Out of a complete 14 system review of symptoms, the patient complains only of the following symptoms, chronic fatigue, dysesthesias, headaches and all other reviewed systems are negative.   ALLERGIES: Allergies  Allergen Reactions   Nabumetone Swelling and Hives   Procaine Swelling   Shellfish Allergy Anaphylaxis    HOME MEDICATIONS: Outpatient Medications Prior to Visit  Medication Sig Dispense Refill   AVONEX PREFILLED 30 MCG/0.5ML PSKT injection INJECT ONE SYRINGE INTRAMUSCULARLY ONCE WEEKLY. REFRIGERATE. PROTECT FROM LIGHT. ALLOW TO WARM TO ROOM TEMPERATURE PRIOR TO USE. 3 each 3   Calcium Carb-Cholecalciferol (CALCIUM 600+D3 PO) Take 1 tablet by mouth in the morning and at bedtime.     ELDERBERRY PO Take by mouth. Patient report taking 2 gummies daily     Ferrous Sulfate Dried (SLOW RELEASE IRON) 45 MG TBCR Take 45 mg by mouth in the morning.     fexofenadine (ALLEGRA) 180 MG tablet Take 180 mg by mouth daily as needed for allergies.     fluticasone (FLONASE) 50 MCG/ACT nasal spray Place 2 sprays into both nostrils daily. (Patient taking differently: Place 2 sprays into both nostrils daily as needed for allergies.) 16 g 2   ibuprofen (ADVIL) 200 MG tablet Take 400 mg by mouth every Friday. W/Avonex injection     KLOR-CON M20 20 MEQ tablet TAKE 1 TABLET BY MOUTH EVERY DAY IN THE MORNING 30 tablet 1   metoprolol succinate (TOPROL-XL) 25 MG 24 hr tablet Take 25 mg by mouth daily.     Multiple Vitamin (MULTIVITAMIN WITH MINERALS) TABS tablet Take 1 tablet by mouth every evening. Centrum     olaparib (LYNPARZA) 150 MG tablet Take 2 tablets (300 mg total) by mouth 2 (two) times daily. Swallow whole. May take with food to decrease nausea and vomiting. 120 tablet 5   Omega-3 Fatty Acids (FISH OIL) 1000 MG CPDR Take 1,000 mg by  mouth daily.     simvastatin (ZOCOR) 20 MG tablet Take 20 mg by mouth every evening.     VENTOLIN HFA 108 (90 BASE) MCG/ACT inhaler Inhale 1-2 puffs into the lungs every 6 (six) hours as needed for shortness of breath or wheezing.     meclizine (ANTIVERT) 25 MG tablet Take 1 tablet (25 mg total) by mouth 3 (three) times daily as needed for dizziness. 30 tablet 0   No facility-administered medications prior to visit.    PAST MEDICAL HISTORY: Past Medical History:  Diagnosis Date   BRCA2 gene mutation positive in female    Breast cancer ([place]) 01/2020  right breast IDC   Bronchitis    Cervical spondylarthritis    Headache(784.0)    High cholesterol    Hypertension    Leukocytosis 2009   Multiple sclerosis ([place]) 2009   followed by Dr. Erling Cruz   Neuromuscular disorder [facility])    Pneumonia    Rhinitis    Seasonal allergies    Sinusitis     PAST SURGICAL HISTORY: Past Surgical History:  Procedure Laterality Date   ABDOMINAL HYSTERECTOMY  2006   uterus, cervix, portion of fallopian tubes for AUB   BREAST LUMPECTOMY WITH RADIOACTIVE SEED AND SENTINEL LYMPH NODE BIOPSY Right 08/08/2020   Procedure: RIGHT BREAST LUMPECTOMY WITH RADIOACTIVE SEED X 3 AND SENTINEL LYMPH NODE BIOPSY;  Surgeon: Stark Klein, MD;  Location: [place];  Service: General;  Laterality: Right;   CESAREAN SECTION     x 3   CORRECTION HAMMER TOE Bilateral    PORT-A-CATH REMOVAL Right 09/21/2020   Procedure: REMOVAL PORT-A-CATH;  Surgeon: Stark Klein, MD;  Location: [place];  Service: General;  Laterality: Right;   PORTACATH PLACEMENT Right 02/22/2020   Procedure: RIGHT SUBCLAVIAN VEIN PORT PLACEMENT;  Surgeon: Donnie Mesa, MD;  Location: [place];  Service: General;  Laterality: Right;   ROBOTIC ASSISTED BILATERAL SALPINGO OOPHERECTOMY Bilateral 01/30/2021   Procedure: XI ROBOTIC ASSISTED BILATERAL SALPINGO OOPHORECTOMY, LYSIS OF ADHESIONS, 1 HOUR;  Surgeon: Lafonda Mosses, MD;  Location[facility];  Service: Gynecology;  Laterality: Bilateral;    FAMILY HISTORY: Family History  Problem Relation Age of Onset   Cancer Mother 54       ovarian cancer; now with suspected breast cancer   Diabetes Mother        also HTN   Breast cancer Mother    Clotting disorder Father    Prostate cancer Father    Colon cancer Sister 67       anal cancer (2014)   Lung cancer Sister 99   Stroke Sister 70   Ovarian cancer Sister 49   BRCA 1/2 Sister    Breast cancer Maternal Aunt    Breast cancer Maternal Aunt    Breast cancer Maternal Aunt    Cancer Maternal Grandmother        unknown female reproductive organ cancer   Endometrial cancer Niece    Pancreatic cancer Neg Hx     SOCIAL HISTORY: Social History   Socioeconomic History   Marital status: Married    Spouse name: John   Number of children: 2   Years of education: Engineer, agricultural education level: Not on file  Occupational History   Occupation: Print production planner: [employer]  Tobacco Use   Smoking status: Never   Smokeless tobacco: Never  Vaping Use   Vaping Use: Never used  Substance and Sexual Activity   Alcohol use: No   Drug use: No   Sexual activity: Yes    Birth control/protection: Surgical  Other Topics Concern   Not on file  Social History Narrative   Patient lives at home with her spouse.   Caffeine Use: 2 sodas daily   Social Determinants of Health   Financial Resource Strain: Low Risk  (01/12/2020)   Overall Financial Resource Strain (CARDIA)    Difficulty of Paying Living Expenses: Not hard at all  Food Insecurity: No Food Insecurity (01/12/2020)   Hunger Vital Sign    Worried About Running Out of Food in the Last Year: Never true    Ran  Out of Food in the Last Year: Never true  Transportation Needs: No Transportation Needs (01/12/2020)   PRAPARE - Hydrologist (Medical): No    Lack of Transportation (Non-Medical): No  Physical Activity: Not on file  Stress: Not  on file  Social Connections: Socially Integrated (01/12/2020)   Social Connection and Isolation Panel [NHANES]    Frequency of Communication with Friends and Family: More than three times a week    Frequency of Social Gatherings with Friends and Family: Not on file    Attends Religious Services: More than 4 times per year    Active Member of Clubs or Organizations: Not on file    Attends Archivist Meetings: More than 4 times per year    Marital Status: Married  Human resources officer Violence: Not on file      PHYSICAL EXAM  Vitals:   08/08/21 1059  BP: (!) 142/83  Pulse: 86  Weight: 224 lb 12.8 oz (102 kg)  Height: 5' 4" (1.626 m)    Body mass index is 38.59 kg/m.  Generalized: Well developed, in no acute distress  Cardiology: normal rate and rhythm, no murmur noted Respiratory: clear to auscultation bilaterally  Neurological examination  Mentation: Alert oriented to time, place, history taking. Follows all commands speech and language fluent Cranial nerve II-XII: Pupils were equal round reactive to light. Extraocular movements were full, visual field were full on confrontational test. Facial sensation and strength were normal. Uvula tongue midline. Head turning and shoulder shrug  were normal and symmetric. Motor: The motor testing reveals 5 over 5 strength of all 4 extremities. Good symmetric motor tone is noted throughout.  Sensory: Sensory testing is intact to soft touch on all 4 extremities. No evidence of extinction is noted.  Coordination: Cerebellar testing reveals good finger-nose-finger and heel-to-shin bilaterally.  Gait and station: Gait is normal.   Reflexes: Deep tendon reflexes are symmetric and normal bilaterally.   DIAGNOSTIC DATA (LABS, IMAGING, TESTING) - I reviewed patient records, labs, notes, testing and imaging myself where available.      No data to display           Lab Results  Component Value Date   WBC 8.0 07/05/2021   HGB 12.3  07/05/2021   HCT 36.3 07/05/2021   MCV 83.6 07/05/2021   PLT 248 07/05/2021      Component Value Date/Time   NA 142 07/05/2021 0835   NA 142 05/05/2020 1625   NA 143 11/22/2013 1514   K 3.5 07/05/2021 0835   K 3.1 (L) 11/22/2013 1514   CL 107 07/05/2021 0835   CL 105 02/18/2012 1207   CO2 27 07/05/2021 0835   CO2 25 11/22/2013 1514   GLUCOSE 121 (H) 07/05/2021 0835   GLUCOSE 105 11/22/2013 1514   GLUCOSE 96 02/18/2012 1207   BUN 7 07/05/2021 0835   BUN 8 05/05/2020 1625   BUN 6.6 (L) 11/22/2013 1514   CREATININE 0.76 07/05/2021 0835   CREATININE 0.8 11/22/2013 1514   CALCIUM 9.1 07/05/2021 0835   CALCIUM 9.0 11/22/2013 1514   PROT 7.3 07/05/2021 0835   PROT 7.0 09/22/2019 0840   PROT 7.4 11/22/2013 1514   ALBUMIN 4.0 07/05/2021 0835   ALBUMIN 4.0 09/22/2019 0840   ALBUMIN 3.3 (L) 11/22/2013 1514   AST 18 07/05/2021 0835   AST 15 11/22/2013 1514   ALT 16 07/05/2021 0835   ALT 13 11/22/2013 1514   ALKPHOS 86 07/05/2021 0835  ALKPHOS 91 11/22/2013 1514   BILITOT 0.5 07/05/2021 0835   BILITOT 0.23 11/22/2013 1514   GFRNONAA >60 07/05/2021 0835   GFRAA 113 09/22/2019 0840   Lab Results  Component Value Date   CHOL 194 03/25/2019   HDL 43 03/25/2019   LDLCALC 133 (H) 03/25/2019   TRIG 99 03/25/2019   CHOLHDL 4.5 (H) 03/25/2019   No results found for: "HGBA1C" No results found for: "VITAMINB12" Lab Results  Component Value Date   TSH 0.873 03/08/2020       ASSESSMENT AND PLAN 61 y.o. year old female  has a past medical history of BRCA2 gene mutation positive in female, Breast cancer ([place]) (01/2020), Bronchitis, Cervical spondylarthritis, Headache(784.0), High cholesterol, Hypertension, Leukocytosis (2009), Multiple sclerosis ([place]) (2009), Neuromuscular disorder ([place]), Pneumonia, Rhinitis, Seasonal allergies, and Sinusitis. here with     ICD-10-CM   1. Multiple sclerosis ([place])  G35     2. High risk medication use  Z79.899     3. Migraine without aura and  without status migrainosus, not intractable  G43.009     4. Vitamin D deficiency  E55.9     5. Dysesthesia  R20.8     6. Other fatigue  R53.83        [place]a is doing well. Symptoms are stable. No new weakness, numbness, vision or gait changes. No changes in bowel or bladder habits. We will continue Avonex injections as prescribed. Labs from oncology reviewed in Epic are stable. She will continue vitamin D supplement OTC. We have discussed worsening headaches since starting oral chemo treatment. We discussed resuming topiramate versus trial of magnesium/riboflavin. She will discuss with oncology and let me know if she wishes to restart topiramate. She will continue focusing on healthy lifestyle habits. She will follow up in 6 months, sooner if needed. She verbalizes understanding and agreement with this plan.    No orders of the defined types were placed in this encounter.    No orders of the defined types were placed in this encounter.     Debbora Presto, FNP-C 08/08/2021, 4:29 PM [facility] [address] [place], [place] [address] [phone]

## 2021-08-08 NOTE — Patient Instructions (Signed)
Below is our plan:  We will continue Avenox. Continue to monitor headaches. Consider magnesium and riboflavin over the counter for prevention. We could discuss restarting topiramate if needed.   Please make sure you are staying well hydrated. I recommend 50-60 ounces daily. Well balanced diet and regular exercise encouraged. Consistent sleep schedule with 6-8 hours recommended.   Please continue follow up with care team as directed.   Follow up with Dr Felecia Shelling in 6 months   You may receive a survey regarding today's visit. I encourage you to leave honest feed back as I do use this information to improve patient care. Thank you for seeing me today!

## 2021-08-15 ENCOUNTER — Other Ambulatory Visit: Payer: 59

## 2021-08-15 ENCOUNTER — Ambulatory Visit: Payer: 59 | Admitting: Hematology and Oncology

## 2021-08-23 ENCOUNTER — Inpatient Hospital Stay: Payer: 59 | Admitting: Pharmacist

## 2021-08-23 ENCOUNTER — Other Ambulatory Visit: Payer: Self-pay

## 2021-08-23 ENCOUNTER — Inpatient Hospital Stay: Payer: 59 | Attending: Hematology and Oncology

## 2021-08-23 DIAGNOSIS — C50411 Malignant neoplasm of upper-outer quadrant of right female breast: Secondary | ICD-10-CM | POA: Diagnosis present

## 2021-08-23 DIAGNOSIS — Z171 Estrogen receptor negative status [ER-]: Secondary | ICD-10-CM

## 2021-08-23 LAB — CBC WITH DIFFERENTIAL (CANCER CENTER ONLY)
Abs Immature Granulocytes: 0.03 10*3/uL (ref 0.00–0.07)
Basophils Absolute: 0 10*3/uL (ref 0.0–0.1)
Basophils Relative: 0 %
Eosinophils Absolute: 0.3 10*3/uL (ref 0.0–0.5)
Eosinophils Relative: 3 %
HCT: 36.5 % (ref 36.0–46.0)
Hemoglobin: 12.2 g/dL (ref 12.0–15.0)
Immature Granulocytes: 0 %
Lymphocytes Relative: 27 %
Lymphs Abs: 2.4 10*3/uL (ref 0.7–4.0)
MCH: 28.6 pg (ref 26.0–34.0)
MCHC: 33.4 g/dL (ref 30.0–36.0)
MCV: 85.5 fL (ref 80.0–100.0)
Monocytes Absolute: 0.6 10*3/uL (ref 0.1–1.0)
Monocytes Relative: 7 %
Neutro Abs: 5.7 10*3/uL (ref 1.7–7.7)
Neutrophils Relative %: 63 %
Platelet Count: 262 10*3/uL (ref 150–400)
RBC: 4.27 MIL/uL (ref 3.87–5.11)
RDW: 16.3 % — ABNORMAL HIGH (ref 11.5–15.5)
WBC Count: 9.1 10*3/uL (ref 4.0–10.5)
nRBC: 0 % (ref 0.0–0.2)

## 2021-08-23 LAB — CMP (CANCER CENTER ONLY)
ALT: 17 U/L (ref 0–44)
AST: 18 U/L (ref 15–41)
Albumin: 4 g/dL (ref 3.5–5.0)
Alkaline Phosphatase: 82 U/L (ref 38–126)
Anion gap: 6 (ref 5–15)
BUN: 7 mg/dL (ref 6–20)
CO2: 30 mmol/L (ref 22–32)
Calcium: 9.4 mg/dL (ref 8.9–10.3)
Chloride: 105 mmol/L (ref 98–111)
Creatinine: 0.69 mg/dL (ref 0.44–1.00)
GFR, Estimated: >60 mL/min (ref >60)
Glucose, Bld: 114 mg/dL — ABNORMAL HIGH (ref 70–99)
Potassium: 3.5 mmol/L (ref 3.5–5.1)
Sodium: 141 mmol/L (ref 135–145)
Total Bilirubin: 0.4 mg/dL (ref 0.3–1.2)
Total Protein: 7.1 g/dL (ref 6.5–8.1)

## 2021-08-23 MED ORDER — OLAPARIB 150 MG PO TABS: 150.0000 mg | ORAL_TABLET | Freq: Two times a day (BID) | ORAL | 6 refills | Status: DC

## 2021-08-23 NOTE — Progress Notes (Signed)
Jacqueline Adams       Telephone: [phone]?Fax: [phone]   Oncology Clinical Pharmacist Practitioner Progress Note  Jacqueline Adams was contacted via in-person visit to discuss her chemotherapy regimen for olaparib which they receive under the care of Dr. Nicholas Lose.    Current treatment regimen and start date Olaparib (03/03/21) 07/12/21: decreased to 150 mg every 12 hours d/t headaches (imaging negative) 05/06/21: increased to 300 mg every 12 hours   Interval History She continues on olaparib 150 mg by mouth  every 12 hours on days 1 to 28 of a 28-day cycle. This is being given  monotherapy. Therapy is planned to continue until  one year in the adjuvant setting per the [place] trial data .   Response to Therapy Jacqueline Adams was seen today by clinical pharmacy as a follow-up to her olaparib management.  She was last seen by clinical pharmacy on 07/05/21 and Dr. [name] on 07/12/21.  At the last visit with Dr. [name], he reviewed her brain imaging which was negative.  He also reduced her olaparib to 150 mg every 12 hours due to continued headaches.  Today, she states that she is doing much better.  The headaches have improved.  She also saw nurse practitioner Amy Lomax from [facility] neurology and she is considering taking riboflavin and magnesium per their recommendations.  Her neurologist is Dr. Arlice Colt.  She also continues to have fatigue.  She is practicing good sleep hygiene and getting plenty of rest.  We recommended to her that she can continue to try to rest when able, eat a well-balanced diet, and try to exercise most days of the week.  We discussed that if it does become a quality-of-life issue, we could discuss with Dr. [name] some prescription options.  She will let us know if this ever becomes an issue.  At her last visit with Dr. [name], he recommended Jacqueline Adams having labs and a visit every 2 months after our visit today.  She also sees Dr. Ruben Gottron, her  PCP, on 08/30/21.  They practice with [place].  We will add labs and a Dr. [name] visit on 10/18/21 and then labs, pharmacy clinic visit, on 12/13/21.  She does have bilateral MRI of the breast scheduled for 10/04/21.  We [facility] for the updated olaparib dose of 150 mg every 12 hours.  Jacqueline Adams knows to contact the clinic sooner should any questions or concerns arise. Labs, vitals, treatment parameters, and manufacturer guidelines assessing toxicity were reviewed with Jacqueline Adams today. Based on these values, patient is in agreement to continue olaparib therapy at this time.  Allergies Allergies  Allergen Reactions   Nabumetone Swelling and Hives   Procaine Swelling   Shellfish Allergy Anaphylaxis    Vitals    08/08/2021   10:59 AM 08/06/2021    8:12 AM 07/05/2021    9:03 AM  Vitals with BMI  Height '5\' 4"'$   '5\' 4"'$   Weight 224 lbs 13 oz 225 lbs 2 oz 229 lbs 13 oz  BMI 70.17  79.39  Systolic 030  092  Diastolic 83  60  Pulse 86  79     Laboratory Data    Latest Ref Rng & Units 08/23/2021    8:25 AM 07/05/2021    8:35 AM 06/11/2021    9:12 AM  CBC EXTENDED  WBC 4.0 - 10.5 K/uL 9.1  8.0  10.9  RBC 3.87 - 5.11 MIL/uL 4.27  4.34  4.32   Hemoglobin 12.0 - 15.0 g/dL 12.2  12.3  12.0   HCT 36.0 - 46.0 % 36.5  36.3  35.4   Platelets 150 - 400 K/uL 262  248  241   NEUT# 1.7 - 7.7 K/uL 5.7  5.0  8.0   Lymph# 0.7 - 4.0 K/uL 2.4  2.2  2.3        Latest Ref Rng & Units 08/23/2021    8:25 AM 07/05/2021    8:35 AM 06/11/2021    9:12 AM  CMP  Glucose 70 - 99 mg/dL 114  121  161   BUN 6 - 20 mg/dL '7  7  7   '$ Creatinine 0.44 - 1.00 mg/dL 0.69  0.76  0.73   Sodium 135 - 145 mmol/L 141  142  142   Potassium 3.5 - 5.1 mmol/L 3.5  3.5  3.6   Chloride 98 - 111 mmol/L 105  107  106   CO2 22 - 32 mmol/L '30  27  31   '$ Calcium 8.9 - 10.3 mg/dL 9.4  9.1  9.1   Total Protein 6.5 - 8.1 g/dL 7.1  7.3  7.0   Total Bilirubin 0.3 -  1.2 mg/dL 0.4  0.5  0.4   Alkaline Phos 38 - 126 U/L 82  86  79   AST 15 - 41 U/L '18  18  18   '$ ALT 0 - 44 U/L '17  16  17     '$ Lab Results  Component Value Date   MG 1.8 05/22/2020     Adverse Effects Assessment Headaches: Improved with the reduced dose of olaparib 150 mg every 12 hours which was started on 07/12/21 Fatigue: This is continuing but stable.  She will let us know if it ever becomes a quality-of-life issue.  She is resting when able.  She is also getting plenty of sleep and practicing good sleep hygiene habits.  We did recommend exercising most days of the week, eating a well-balanced diet, and listening to her body when she needs rest.  Adherence Assessment Jacqueline Adams reports missing 0 doses over the past 4 weeks.   Reason for missed dose: N/A Patient was re-educated on importance of adherence.   Access Assessment Jacqueline Adams is currently [facility] Insurance concerns: None  Medication Reconciliation The patient's medication list was reviewed today with the patient?  Yes New medications or herbal supplements have recently been started?  No Any medications have been discontinued?  No The medication list was updated and reconciled based on the patient's most recent medication list in the electronic medical record (EMR) including herbal products and OTC medications.   Medications Current Outpatient Medications  Medication Sig Dispense Refill   AVONEX PREFILLED 30 MCG/0.5ML PSKT injection INJECT ONE SYRINGE INTRAMUSCULARLY ONCE WEEKLY. REFRIGERATE. PROTECT FROM LIGHT. ALLOW TO WARM TO ROOM TEMPERATURE PRIOR TO USE. 3 each 3   Calcium Carb-Cholecalciferol (CALCIUM 600+D3 PO) Take 1 tablet by mouth in the morning and at bedtime.     ELDERBERRY PO Take by mouth. Patient report taking 2 gummies daily     Ferrous Sulfate Dried (SLOW RELEASE IRON) 45 MG TBCR Take 45 mg by mouth in the morning.     fexofenadine (ALLEGRA) 180  MG tablet Take 180 mg by mouth daily as needed for allergies.     fluticasone (FLONASE) 50 MCG/ACT nasal spray Place 2 sprays into  both nostrils daily. (Patient taking differently: Place 2 sprays into both nostrils daily as needed for allergies.) 16 g 2   ibuprofen (ADVIL) 200 MG tablet Take 400 mg by mouth every Friday. W/Avonex injection     KLOR-CON M20 20 MEQ tablet TAKE 1 TABLET BY MOUTH EVERY DAY IN THE MORNING 30 tablet 1   metoprolol succinate (TOPROL-XL) 25 MG 24 hr tablet Take 25 mg by mouth daily.     Multiple Vitamin (MULTIVITAMIN WITH MINERALS) TABS tablet Take 1 tablet by mouth every evening. Centrum     olaparib (LYNPARZA) 150 MG tablet Take 2 tablets (300 mg total) by mouth 2 (two) times daily. Swallow whole. May take with food to decrease nausea and vomiting. 120 tablet 5   Omega-3 Fatty Acids (FISH OIL) 1000 MG CPDR Take 1,000 mg by mouth daily.     simvastatin (ZOCOR) 20 MG tablet Take 20 mg by mouth every evening.     VENTOLIN HFA 108 (90 BASE) MCG/ACT inhaler Inhale 1-2 puffs into the lungs every 6 (six) hours as needed for shortness of breath or wheezing.     No current facility-administered medications for this visit.    Drug-Drug Interactions (DDIs) DDIs were evaluated?  Yes Significant DDIs?  No The patient was instructed to speak with their health care provider and/or the oral chemotherapy pharmacist before starting any new drug, including prescription or over the counter, natural / herbal products, or vitamins.  Supportive Care Fever: reviewed the importance of having a thermometer and the Centers for Disease Control and Prevention (CDC) definition of fever which is 100.53F (38C) or higher. Patient should call 24/7 triage at [phone] if experiencing a fever or any other symptoms N/V/D Fatigue  Dosing Assessment Hepatic adjustments needed?  No Renal adjustments needed?  No Toxicity adjustments needed?  No The current dosing regimen is appropriate to  continue at this time.  Follow-Up Plan Continue olaparib 150 mg by mouth every 12 hours Continue to monitor headaches which have improved with a dose reduction of olaparib. Continue to monitor fatigue Labs, Dr. [name] visit, in 2 months Labs, pharmacy clinic visit, in 4 months  Jacqueline Adams participated in the discussion, expressed understanding, and voiced agreement with the above plan. All questions were answered to her satisfaction. The patient was advised to contact the clinic at [phone] with any questions or concerns prior to her return visit.   I spent 30 minutes assessing and educating the patient.  Raina Mina, RPH-CPP, 08/23/2021  8:52 AM   **Disclaimer: This note was dictated with voice recognition software. Similar sounding words can inadvertently be transcribed and this note may contain transcription errors which may not have been corrected upon publication of note.**

## 2021-08-28 ENCOUNTER — Other Ambulatory Visit: Payer: Self-pay | Admitting: Hematology and Oncology

## 2021-09-22 ENCOUNTER — Other Ambulatory Visit: Payer: Self-pay | Admitting: Hematology and Oncology

## 2021-10-04 ENCOUNTER — Other Ambulatory Visit: Payer: Self-pay | Admitting: Hematology and Oncology

## 2021-10-04 ENCOUNTER — Ambulatory Visit
Admission: RE | Admit: 2021-10-04 | Discharge: 2021-10-04 | Disposition: A | Discharge status: Home / Self Care | Payer: 59 | Source: Ambulatory Visit | Attending: Hematology and Oncology | Admitting: Hematology and Oncology

## 2021-10-04 DIAGNOSIS — Z171 Estrogen receptor negative status [ER-]: Secondary | ICD-10-CM

## 2021-10-04 DIAGNOSIS — R928 Other abnormal and inconclusive findings on diagnostic imaging of breast: Secondary | ICD-10-CM

## 2021-10-04 DIAGNOSIS — Z1501 Genetic susceptibility to malignant neoplasm of breast: Secondary | ICD-10-CM

## 2021-10-04 MED ORDER — GADOBUTROL 1 MMOL/ML IV SOLN
10.0000 mL | Freq: Once | INTRAVENOUS | Status: AC | PRN
  Administered 2021-10-04: 10 mL via INTRAVENOUS

## 2021-10-10 ENCOUNTER — Ambulatory Visit
Admission: RE | Admit: 2021-10-10 | Discharge: 2021-10-10 | Disposition: A | Discharge status: Home / Self Care | Payer: 59 | Source: Ambulatory Visit | Attending: Hematology and Oncology | Admitting: Hematology and Oncology

## 2021-10-10 DIAGNOSIS — R928 Other abnormal and inconclusive findings on diagnostic imaging of breast: Secondary | ICD-10-CM

## 2021-10-10 HISTORY — PX: BREAST BIOPSY: SHX20

## 2021-10-10 MED ORDER — GADOBUTROL 1 MMOL/ML IV SOLN
10.0000 mL | Freq: Once | INTRAVENOUS | Status: AC | PRN
  Administered 2021-10-10: 10 mL via INTRAVENOUS

## 2021-10-17 NOTE — Progress Notes (Signed)
Patient Care Team: Robyne Peers, MD as PCP - General (Family Medicine) Jerrell Belfast, MD Nicholas Lose, MD as Consulting Physician (Hematology and Oncology) Kyung Rudd, MD as Consulting Physician (Radiation Oncology) Sater, Nanine Means, MD (Neurology) Stark Klein, MD as Consulting Physician (General Surgery) Josue Hector, MD as Consulting Physician (Cardiology) Raina Mina, RPH-CPP as Pharmacist (Hematology and Oncology)  DIAGNOSIS:  Encounter Diagnosis  Name Primary?   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    01/10/2020 Initial Diagnosis   Palpable right breast mass. Mammogram showed a 1.8cm mass at the 10:30 position 2cm from the nipple, a 1.5cm mass at the 10:30 position 7cm from the nipple, and indeterminate calcifications between the two masses in the right breast. 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    01/12/2020 Cancer Staging   Staging form: Breast, AJCC 8th Edition - Clinical stage from 01/12/2020: Stage IB (cT1c, cN0, cM0, G3, ER-, PR-, HER2-) - Signed by Nicholas Lose, MD on 02/03/2020   02/23/2020 - 07/05/2020 Chemotherapy   Patient is on Treatment Plan : BREAST Dose Dense AC q14d / CARBOplatin D1 + PACLitaxel D1,8,15 q21d     08/08/2020 Surgery   Right breast lumpectomy: scattered foci of DCIS with calcifications, intermediate to high-grade, largest measuring 0.3 cm, no residual invasive carcinoma, resection margins negative for DCIS, and all 3 axillary nodes negative for carcinoma   11/21/2020 - 12/18/2020 Radiation Therapy   11/21/2020 through 12/18/2020 Site Technique Total Dose (Gy)  Dose per Fx (Gy) Completed Fx Beam Energies  Breast, Right: Breast_Rt 3D 42.56/42.56 2.66 16/16 10X  Breast, Right: Breast_Rt_Bst 3D 8/8 2 4/4 6X, 10X    03/04/2021 Miscellaneous   Olaparib started at 172m twice daily Increased to 3080mBID 04/30/2021     CHIEF COMPLIANT: Follow-up breast cancer surveillance on [place]  INTERVAL HISTORY: Jacqueline BUSKs a [age].o with the above mention. She presents to the clinic today for a follow-up. She states that she still have headaches but they have got better. She still have fatigue. Denies loose stools. Denies neuropathy.   ALLERGIES:  is allergic to nabumetone, procaine, and shellfish allergy.  MEDICATIONS:  Current Outpatient Medications  Medication Sig Dispense Refill   AVONEX PREFILLED 30 MCG/0.5ML PSKT injection INJECT ONE SYRINGE INTRAMUSCULARLY ONCE WEEKLY. REFRIGERATE. PROTECT FROM LIGHT. ALLOW TO WARM TO ROOM TEMPERATURE PRIOR TO USE. 3 each 3   Calcium Carb-Cholecalciferol (CALCIUM 600+D3 PO) Take 1 tablet by mouth in the morning and at bedtime.     ELDERBERRY PO Take by mouth. Patient report taking 2 gummies daily     fexofenadine (ALLEGRA) 180 MG tablet Take 180 mg by mouth daily as needed for allergies.     fluticasone (FLONASE) 50 MCG/ACT nasal spray Place 2 sprays into both nostrils daily. (Patient taking differently: Place 2 sprays into both nostrils daily as needed for allergies.) 16 g 2   ibuprofen (ADVIL) 200 MG tablet Take 400 mg by mouth every Friday. W/Avonex injection     metoprolol succinate (TOPROL-XL) 25 MG 24 hr tablet Take 25 mg by mouth daily.     Multiple Vitamin (MULTIVITAMIN WITH MINERALS) TABS tablet  Take 1 tablet by mouth every evening. Centrum     olaparib (LYNPARZA) 150 MG tablet Take 1 tablet (150 mg total) by mouth 2 (two) times daily. Swallow whole. May take with food to decrease nausea and vomiting. 60 tablet 6   Omega-3 Fatty Acids (FISH OIL) 1000 MG CPDR Take 1,000 mg by mouth daily.      simvastatin (ZOCOR) 20 MG tablet Take 20 mg by mouth every evening.     VENTOLIN HFA 108 (90 BASE) MCG/ACT inhaler Inhale 1-2 puffs into the lungs every 6 (six) hours as needed for shortness of breath or wheezing.     No current facility-administered medications for this visit.    PHYSICAL EXAMINATION: ECOG PERFORMANCE STATUS: 1 - Symptomatic but completely ambulatory  Vitals:   10/18/21 0905  BP: 122/71  Pulse: 80  Resp: 16  Temp: 97.9 F (36.6 C)  SpO2: 98%   Filed Weights   10/18/21 0905  Weight: 222 lb (100.7 kg)      LABORATORY DATA:  I have reviewed the data as listed    Latest Ref Rng & Units 08/23/2021    8:25 AM 07/05/2021    8:35 AM 06/11/2021    9:12 AM  CMP  Glucose 70 - 99 mg/dL 114  121  161   BUN 6 - 20 mg/dL 7  7  7    Creatinine 0.44 - 1.00 mg/dL 0.69  0.76  0.73   Sodium 135 - 145 mmol/L 141  142  142   Potassium 3.5 - 5.1 mmol/L 3.5  3.5  3.6   Chloride 98 - 111 mmol/L 105  107  106   CO2 22 - 32 mmol/L 30  27  31    Calcium 8.9 - 10.3 mg/dL 9.4  9.1  9.1   Total Protein 6.5 - 8.1 g/dL 7.1  7.3  7.0   Total Bilirubin 0.3 - 1.2 mg/dL 0.4  0.5  0.4   Alkaline Phos 38 - 126 U/L 82  86  79   AST 15 - 41 U/L 18  18  18    ALT 0 - 44 U/L 17  16  17      Lab Results  Component Value Date   WBC 9.5 10/18/2021   HGB 12.1 10/18/2021   HCT 35.7 (L) 10/18/2021   MCV 83.6 10/18/2021   PLT 243 10/18/2021   NEUTROABS 6.5 10/18/2021    ASSESSMENT & PLAN:  Malignant neoplasm of upper-outer quadrant of right breast in female, estrogen receptor negative ([place]) 01/10/2020:Palpable right breast mass. Mammogram showed a 1.8cm mass at the 10:30 position 2cm from the nipple, a 1.5cm mass at the 10:30 position 7cm from the nipple, and indeterminate calcifications between the two masses in the right breast. 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  BRCA2 Mutation   Treatment plan: 1. Neoadjuvant  chemotherapy with Adriamycin and Cytoxan every 2 weeks 4 followed by Taxol weekly 12 with carboplatin and  every 3 weeks x4 completed 07/05/2020 2. 08/08/20 breast conserving surgery with sentinel lymph node study : scattered foci of DCIS, margins Neg, 0/3 LN Neg 3. Adjuvant radiation therapy 11/22/20- 12/18/20 4. Adjuvant olaparib started 02/20/21 [facility] nausea study   Breast MRI 01/14/2020: 2 cm spiculated mass UOQ right breast, 2.1 cm spiculated mass UOQ right breast, biopsy-proven malignancies.  7 mm suspicious enhancing mass: Biopsy 02/02/2020: High-grade DCIS ----------------------------------------------------------------------------------------------------------------------------------------------- Olaparib: Jacqueline Adams is a PARP inhibitor started 02/20/2021 (1 year  of total therapy)   [place] trial showed that the recurrence rate improves with olaparib.  86% versus 77%. Dose: 300 mg twice daily reduced to 150 p.o. twice daily starting 07/12/2021 (this will be her dose for the rest of the treatment)   Adverse effects:  1.  Very mild nausea 2. fatigue 3. Rec Headaches:  We reduced the dosage to 150 p.o. twice daily.  She is doing much better   Breast cancer surveillance:  1.  Mammograms were done in  04/02/2021: Benign 2. breast MRI 10/04/2021: Indeterminate 4 mm mass left outer lower quadrant: MRI biopsy: Fibrocystic change with usual ductal hyperplasia  she had prophylactic oophorectomy.   Brain MRI: 07/11/21: Findings of MS. No brain mets  Patient has an appointment in November to see John.    No orders of the defined types were placed in this encounter.  The patient has a good understanding of the overall plan. she agrees with it. she will call with any problems that may develop before the next visit here. Total time spent: 30 mins including face to face time and time spent for planning, charting and co-ordination of care   Harriette Ohara, MD 10/18/21    I Gardiner Coins am  scribing for Dr. [name]  I have reviewed the above documentation for accuracy and completeness, and I agree with the above.

## 2021-10-18 ENCOUNTER — Other Ambulatory Visit: Payer: Self-pay

## 2021-10-18 ENCOUNTER — Inpatient Hospital Stay: Payer: 59 | Admitting: Hematology and Oncology

## 2021-10-18 ENCOUNTER — Inpatient Hospital Stay: Payer: 59 | Attending: Hematology and Oncology

## 2021-10-18 DIAGNOSIS — Z171 Estrogen receptor negative status [ER-]: Secondary | ICD-10-CM

## 2021-10-18 DIAGNOSIS — C50411 Malignant neoplasm of upper-outer quadrant of right female breast: Secondary | ICD-10-CM | POA: Diagnosis not present

## 2021-10-18 DIAGNOSIS — Z79811 Long term (current) use of aromatase inhibitors: Secondary | ICD-10-CM | POA: Diagnosis not present

## 2021-10-18 LAB — CMP (CANCER CENTER ONLY)
ALT: 14 U/L (ref 0–44)
AST: 17 U/L (ref 15–41)
Albumin: 3.8 g/dL (ref 3.5–5.0)
Alkaline Phosphatase: 84 U/L (ref 38–126)
Anion gap: 6 (ref 5–15)
BUN: 7 mg/dL (ref 6–20)
CO2: 28 mmol/L (ref 22–32)
Calcium: 9.1 mg/dL (ref 8.9–10.3)
Chloride: 107 mmol/L (ref 98–111)
Creatinine: 0.72 mg/dL (ref 0.44–1.00)
GFR, Estimated: >60 mL/min (ref >60)
Glucose, Bld: 115 mg/dL — ABNORMAL HIGH (ref 70–99)
Potassium: 3.5 mmol/L (ref 3.5–5.1)
Sodium: 141 mmol/L (ref 135–145)
Total Bilirubin: 0.5 mg/dL (ref 0.3–1.2)
Total Protein: 7.2 g/dL (ref 6.5–8.1)

## 2021-10-18 LAB — CBC WITH DIFFERENTIAL (CANCER CENTER ONLY)
Abs Immature Granulocytes: 0.02 10*3/uL (ref 0.00–0.07)
Basophils Absolute: 0 10*3/uL (ref 0.0–0.1)
Basophils Relative: 0 %
Eosinophils Absolute: 0.1 10*3/uL (ref 0.0–0.5)
Eosinophils Relative: 1 %
HCT: 35.7 % — ABNORMAL LOW (ref 36.0–46.0)
Hemoglobin: 12.1 g/dL (ref 12.0–15.0)
Immature Granulocytes: 0 %
Lymphocytes Relative: 25 %
Lymphs Abs: 2.3 10*3/uL (ref 0.7–4.0)
MCH: 28.3 pg (ref 26.0–34.0)
MCHC: 33.9 g/dL (ref 30.0–36.0)
MCV: 83.6 fL (ref 80.0–100.0)
Monocytes Absolute: 0.4 10*3/uL (ref 0.1–1.0)
Monocytes Relative: 4 %
Neutro Abs: 6.5 10*3/uL (ref 1.7–7.7)
Neutrophils Relative %: 70 %
Platelet Count: 243 10*3/uL (ref 150–400)
RBC: 4.27 MIL/uL (ref 3.87–5.11)
RDW: 15.2 % (ref 11.5–15.5)
WBC Count: 9.5 10*3/uL (ref 4.0–10.5)
nRBC: 0 % (ref 0.0–0.2)

## 2021-10-18 NOTE — Assessment & Plan Note (Addendum)
01/10/2020:Palpable right breast mass. Mammogram showed a 1.8cm mass at the 10:30 position 2cm from the nipple, a 1.5cm mass at the 10:30 position 7cm from the nipple, and indeterminate calcifications between the two masses in the right breast. 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 BRCA2Mutation  Treatment plan: 1. Neoadjuvant chemotherapy with Adriamycin and Cytoxan every2weeks4 followed by Taxol weekly 12 with carboplatinand every 3 weeks x4completed 07/05/2020 2.6/28/22breast conserving surgery with sentinel lymph node study : scattered foci of DCIS, margins Neg, 0/3 LN Neg 3.Adjuvant radiation therapy10/12/22- 12/18/20 4.Adjuvant olaparibstarted 02/20/21 [facility] nausea study  Breast MRI12/04/2019: 2 cm spiculated mass UOQ right breast, 2.1 cm spiculated mass UOQ right breast, biopsy-proven malignancies. 7 mm suspicious enhancing mass: Biopsy 02/02/2020: High-grade DCIS ----------------------------------------------------------------------------------------------------------------------------------------------- Olaparib: Lonie Peak is a PARP inhibitorstarted 02/20/2021(1 year of total therapy)  [place] trial showed that the recurrence rate improves with olaparib. 86% versus 77%. Dose: 300 mg twice dailyreduced to 150 p.o. twice daily starting 07/12/2021 (this will be her dose for the rest of the treatment)  Adverse effects: 1.Very mild nausea 2.fatigue 3. Rec Headaches:  We reduced the dosage to 150 p.o. twice daily.  She is doing much better  Breast cancer surveillance:  1. Mammograms were done in  04/02/2021: Benign 2. breast MRI 10/04/2021: Indeterminate 4 mm mass left outer lower quadrant: MRI biopsy: Fibrocystic change with usual ductal hyperplasia  she hadprophylactic oophorectomy.  Brain MRI: 07/11/21: Findings of MS. No brain mets  Patient has an appointment in November to see  John.

## 2021-10-20 ENCOUNTER — Other Ambulatory Visit: Payer: Self-pay | Admitting: Hematology and Oncology

## 2021-11-05 ENCOUNTER — Ambulatory Visit: Payer: 59 | Attending: Hematology and Oncology

## 2021-11-05 VITALS — Wt 221.1 lb

## 2021-11-05 DIAGNOSIS — Z483 Aftercare following surgery for neoplasm: Secondary | ICD-10-CM | POA: Insufficient documentation

## 2021-11-05 NOTE — Therapy (Signed)
OUTPATIENT PHYSICAL THERAPY SOZO SCREENING NOTE   Patient Name: Jacqueline Adams MRN: [id number] DOB:[birth year], 61 y.o., female Today's Date: 11/05/2021  PCP: Robyne Peers, MD REFERRING PROVIDER: Nicholas Lose, MD   PT End of Session - 11/05/21 0844     Visit Number 24   # unchanged due to screen only   PT Start Time 0841    PT Stop Time 0846    PT Time Calculation (min) 5 min    Activity Tolerance Patient tolerated treatment well    Behavior During Therapy [facility] for tasks assessed/performed             Past Medical History:  Diagnosis Date   BRCA2 gene mutation positive in female    Breast cancer ([place]) 01/2020   right breast IDC   Bronchitis    Cervical spondylarthritis    Headache(784.0)    High cholesterol    Hypertension    Leukocytosis 2009   Multiple sclerosis ([place]) 2009   followed by Dr. Erling Cruz   Neuromuscular disorder [facility])    Pneumonia    Rhinitis    Seasonal allergies    Sinusitis    Past Surgical History:  Procedure Laterality Date   ABDOMINAL HYSTERECTOMY  2006   uterus, cervix, portion of fallopian tubes for AUB   BREAST LUMPECTOMY WITH RADIOACTIVE SEED AND SENTINEL LYMPH NODE BIOPSY Right 08/08/2020   Procedure: RIGHT BREAST LUMPECTOMY WITH RADIOACTIVE SEED X 3 AND SENTINEL LYMPH NODE BIOPSY;  Surgeon: Stark Klein, MD;  Location: [employer] [facility];  Service: General;  Laterality: Right;   CESAREAN SECTION     x 3   CORRECTION HAMMER TOE Bilateral    PORT-A-CATH REMOVAL Right 09/21/2020   Procedure: REMOVAL PORT-A-CATH;  Surgeon: Stark Klein, MD;  Location: [facility];  Service: General;  Laterality: Right;   PORTACATH PLACEMENT Right 02/22/2020   Procedure: RIGHT SUBCLAVIAN VEIN PORT PLACEMENT;  Surgeon: Donnie Mesa, MD;  Location: [place];  Service: General;  Laterality: Right;   ROBOTIC ASSISTED BILATERAL SALPINGO OOPHERECTOMY Bilateral 01/30/2021   Procedure: XI ROBOTIC ASSISTED BILATERAL SALPINGO OOPHORECTOMY, LYSIS OF  ADHESIONS, 1 HOUR;  Surgeon: Lafonda Mosses, MD;  Location: [facility];  Service: Gynecology;  Laterality: Bilateral;   Patient Active Problem List   Diagnosis Date Noted   Bilateral ovarian cysts    Malignant neoplasm of upper-outer quadrant of right breast in female, estrogen receptor negative ([place]) 01/10/2020   High risk medication use 03/25/2019   Microcytic anemia 12/31/2017   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 UACS 12/16/2017   History of pneumonia 11/12/2017   Morbid obesity ([place]) 08/19/2016   Numbness 05/09/2015   Other fatigue 05/09/2015   Arthralgia of hip 11/16/2014   Low back pain with sciatica 11/16/2014   Urinary frequency 11/16/2014   Encounter for general adult medical examination without abnormal findings 06/07/2014   DOE (dyspnea on exertion) 04/27/2014   Atypical chest pain 04/27/2014   Dyslipidemia 04/27/2014   HLD (hyperlipidemia) 02/14/2014   Cervical spondylosis without myelopathy 07/28/2013   Cervical pain 07/28/2013   CFIDS (chronic fatigue and immune dysfunction syndrome) ([place]) 07/28/2013   Chronic infection of sinus 07/28/2013   Headache, migraine 07/28/2013   DS (disseminated sclerosis) ([place]) 07/28/2013   Spasms of the hands or feet 07/28/2013   DDD (degenerative disc disease), cervical 07/28/2013   Menopausal symptom 07/28/2013   Taking drug for chronic disease 07/28/2013   Vitamin D deficiency 07/28/2013   Degeneration of intervertebral disc of cervical region 07/28/2013   Cough  04/06/2013   BRCA gene mutation positive in female 06/12/2011   Genetic susceptibility to malignant neoplasm of breast 06/12/2011   Multiple sclerosis ([place])    Leukocytosis     REFERRING DIAG: right breast cancer at risk for lymphedema  THERAPY DIAG: Aftercare following surgery for neoplasm  PERTINENT HISTORY: Patient was diagnosed on 12/23/2019 with 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 She underwent neoadjuvant chemotherapy 02/23/2020 -  07/05/2020 followed by a right lumpectomy and sentinel node biopsy (3 negative nodes) on 08/08/2020. Completed radiation.  She has relapsing remitting multiple sclerosis since 2009  PRECAUTIONS: right UE Lymphedema risk, None  SUBJECTIVE: Pt returns for her 3 month L-Dex screen.   PAIN:  Are you having pain? No  SOZO SCREENING: Patient was assessed today using the SOZO machine to determine the lymphedema index score. This was compared to her baseline score. It was determined that she is within the recommended range when compared to her baseline and no further action is needed at this time. She will continue SOZO screenings. These are done every 3 months for 2 years post operatively followed by every 6 months for 2 years, and then annually.   L-DEX FLOWSHEETS - 11/05/21 0800       L-DEX LYMPHEDEMA SCREENING   Measurement Type Unilateral    L-DEX MEASUREMENT EXTREMITY Upper Extremity    POSITION  Standing    DOMINANT SIDE Right    At Risk Side Right    BASELINE SCORE (UNILATERAL) -0.3    L-DEX SCORE (UNILATERAL) 0.8    VALUE CHANGE (UNILAT) 1.1              Otelia Limes, PTA 11/05/2021, 8:45 AM

## 2021-11-16 ENCOUNTER — Other Ambulatory Visit: Payer: Self-pay | Admitting: Hematology and Oncology

## 2021-12-12 ENCOUNTER — Other Ambulatory Visit: Payer: Self-pay | Admitting: *Deleted

## 2021-12-12 DIAGNOSIS — Z171 Estrogen receptor negative status [ER-]: Secondary | ICD-10-CM

## 2021-12-13 ENCOUNTER — Inpatient Hospital Stay: Payer: 59 | Admitting: Pharmacist

## 2021-12-13 ENCOUNTER — Inpatient Hospital Stay: Payer: 59 | Attending: Hematology and Oncology

## 2021-12-13 VITALS — BP 112/54 | HR 80 | Temp 97.7°F | Resp 18 | Ht 64.0 in | Wt 223.8 lb

## 2021-12-13 DIAGNOSIS — Z171 Estrogen receptor negative status [ER-]: Secondary | ICD-10-CM | POA: Insufficient documentation

## 2021-12-13 DIAGNOSIS — Z79899 Other long term (current) drug therapy: Secondary | ICD-10-CM | POA: Diagnosis not present

## 2021-12-13 DIAGNOSIS — C50411 Malignant neoplasm of upper-outer quadrant of right female breast: Secondary | ICD-10-CM | POA: Diagnosis present

## 2021-12-13 LAB — CBC WITH DIFFERENTIAL (CANCER CENTER ONLY)
Abs Immature Granulocytes: 0.04 10*3/uL (ref 0.00–0.07)
Basophils Absolute: 0 10*3/uL (ref 0.0–0.1)
Basophils Relative: 0 %
Eosinophils Absolute: 0.2 10*3/uL (ref 0.0–0.5)
Eosinophils Relative: 2 %
HCT: 37.5 % (ref 36.0–46.0)
Hemoglobin: 12.6 g/dL (ref 12.0–15.0)
Immature Granulocytes: 0 %
Lymphocytes Relative: 25 %
Lymphs Abs: 2.7 10*3/uL (ref 0.7–4.0)
MCH: 28.2 pg (ref 26.0–34.0)
MCHC: 33.6 g/dL (ref 30.0–36.0)
MCV: 83.9 fL (ref 80.0–100.0)
Monocytes Absolute: 0.5 10*3/uL (ref 0.1–1.0)
Monocytes Relative: 5 %
Neutro Abs: 7.4 10*3/uL (ref 1.7–7.7)
Neutrophils Relative %: 68 %
Platelet Count: 247 10*3/uL (ref 150–400)
RBC: 4.47 MIL/uL (ref 3.87–5.11)
RDW: 16.4 % — ABNORMAL HIGH (ref 11.5–15.5)
WBC Count: 10.9 10*3/uL — ABNORMAL HIGH (ref 4.0–10.5)
nRBC: 0 % (ref 0.0–0.2)

## 2021-12-13 LAB — CMP (CANCER CENTER ONLY)
ALT: 16 U/L (ref 0–44)
AST: 19 U/L (ref 15–41)
Albumin: 4 g/dL (ref 3.5–5.0)
Alkaline Phosphatase: 78 U/L (ref 38–126)
Anion gap: 7 (ref 5–15)
BUN: 7 mg/dL (ref 6–20)
CO2: 29 mmol/L (ref 22–32)
Calcium: 9.1 mg/dL (ref 8.9–10.3)
Chloride: 105 mmol/L (ref 98–111)
Creatinine: 0.75 mg/dL (ref 0.44–1.00)
GFR, Estimated: >60 mL/min (ref >60)
Glucose, Bld: 111 mg/dL — ABNORMAL HIGH (ref 70–99)
Potassium: 3.5 mmol/L (ref 3.5–5.1)
Sodium: 141 mmol/L (ref 135–145)
Total Bilirubin: 0.5 mg/dL (ref 0.3–1.2)
Total Protein: 7.5 g/dL (ref 6.5–8.1)

## 2021-12-13 NOTE — Progress Notes (Signed)
[place]       Telephone: [phone]?Fax: [phone]   Oncology Clinical Pharmacist Practitioner Progress Note  Jacqueline Adams was contacted via in-person visit to discuss her chemotherapy regimen for olaparib which they receive under the care of Dr. Nicholas Lose.    Current treatment regimen and start date Olaparib (03/03/21) 07/12/21: decreased to 150 mg every 12 hours d/t headaches (imaging negative) 05/06/21: increased to 300 mg every 12 hours   Interval History She continues on olaparib 150 mg by mouth  every 12 hours on days 1 to 28 of a 28-day cycle. This is being given  monotherapy. Therapy is planned to continue until  one year in the adjuvant setting per the [employer] trial data .  She was last seen by Dr. [name] on 10/18/21 and clinical pharmacy on 08/23/21.  Last day of olaparib is planned for 03/02/22  Response to Therapy Jacqueline Adams is doing well.  She continues to have some fatigue and mild headaches but this is stable.  She is not reporting any missed doses of olaparib or new side effects attributed to the drug.  We went over her medication list today and she is now taking an OTC magnesium supplement as recommended by Debbora Presto, NP who works with her neurologist Dr. Felecia Shelling.  She feels this is helped some.  We will add a follow-up appointment with Dr. [name] in January with labs and explained to Jacqueline Adams today that she can see clinical pharmacy as needed going forward.  Labs, vitals, treatment parameters, and manufacturer guidelines assessing toxicity were reviewed with Jacqueline Adams today. Based on these values, patient is in agreement to continue olaparib therapy at this time.  Allergies Allergies  Allergen Reactions   Nabumetone Swelling and Hives   Procaine Swelling   Shellfish Allergy Anaphylaxis    Vitals    12/13/2021    8:59 AM 11/05/2021    8:43 AM 10/18/2021    9:05 AM  Oncology Vitals  Height 163 cm  163 cm  Weight 101.515 kg  100.302 kg 100.699 kg  Weight (lbs) 223 lbs 13 oz 221 lbs 2 oz 222 lbs  BMI 38.42 kg/m2   38.42 kg/m2 37.96 kg/m2   37.96 kg/m2 38.11 kg/m2   38.11 kg/m2  Temp 97.7 F (36.5 C)  97.9 F (36.6 C)  Pulse Rate 80  80  BP 112/54  122/71  Resp 18  16  SpO2 98 %  98 %  BSA (m2) 2.14 m2   2.14 m2 2.13 m2   2.13 m2 2.13 m2   2.13 m2    Laboratory Data    Latest Ref Rng & Units 12/13/2021    8:43 AM 10/18/2021    8:46 AM 08/23/2021    8:25 AM  CBC EXTENDED  WBC 4.0 - 10.5 K/uL 10.9  9.5  9.1   RBC 3.87 - 5.11 MIL/uL 4.47  4.27  4.27   Hemoglobin 12.0 - 15.0 g/dL 12.6  12.1  12.2   HCT 36.0 - 46.0 % 37.5  35.7  36.5   Platelets 150 - 400 K/uL 247  243  262   NEUT# 1.7 - 7.7 K/uL 7.4  6.5  5.7   Lymph# 0.7 - 4.0 K/uL 2.7  2.3  2.4        Latest Ref Rng & Units 12/13/2021    8:43 AM 10/18/2021    8:46 AM 08/23/2021    8:25 AM  CMP  Glucose 70 -  99 mg/dL 111  115  114   BUN 6 - 20 mg/dL '7  7  7   '$ Creatinine 0.44 - 1.00 mg/dL 0.75  0.72  0.69   Sodium 135 - 145 mmol/L 141  141  141   Potassium 3.5 - 5.1 mmol/L 3.5  3.5  3.5   Chloride 98 - 111 mmol/L 105  107  105   CO2 22 - 32 mmol/L '29  28  30   '$ Calcium 8.9 - 10.3 mg/dL 9.1  9.1  9.4   Total Protein 6.5 - 8.1 g/dL 7.5  7.2  7.1   Total Bilirubin 0.3 - 1.2 mg/dL 0.5  0.5  0.4   Alkaline Phos 38 - 126 U/L 78  84  82   AST 15 - 41 U/L '19  17  18   '$ ALT 0 - 44 U/L '16  14  17     '$ Lab Results  Component Value Date   MG 1.8 05/22/2020   No results found for: "CA2729"  Adverse Effects Assessment Fatigue: Stable Headaches: Stable  Adherence Assessment Jacqueline Adams reports missing 0 doses over the past 8 weeks.   Reason for missed dose: N/A Patient was re-educated on importance of adherence.   Access Assessment Jacqueline Adams is currently [facility] Insurance concerns: None  Medication Reconciliation The patient's medication list was reviewed today with the patient?   Yes New medications or herbal supplements have recently been started?  Yes, added magnesium supplement that she takes over-the-counter.  She was not certain of the dose but believes it was magnesium oxide. Any medications have been discontinued?  No The medication list was updated and reconciled based on the patient's most recent medication list in the electronic medical record (EMR) including herbal products and OTC medications.   Medications Current Outpatient Medications  Medication Sig Dispense Refill   AVONEX PREFILLED 30 MCG/0.5ML PSKT injection INJECT ONE SYRINGE INTRAMUSCULARLY ONCE WEEKLY. REFRIGERATE. PROTECT FROM LIGHT. ALLOW TO WARM TO ROOM TEMPERATURE PRIOR TO USE. 3 each 3   Calcium Carb-Cholecalciferol (CALCIUM 600+D3 PO) Take 1 tablet by mouth in the morning and at bedtime.     ELDERBERRY PO Take by mouth. Patient report taking 2 gummies daily     ibuprofen (ADVIL) 200 MG tablet Take 400 mg by mouth every Friday. W/Avonex injection     KLOR-CON M20 20 MEQ tablet TAKE 1 TABLET BY MOUTH EVERY DAY IN THE MORNING 90 tablet 1   magnesium oxide (MAG-OX) 400 (240 Mg) MG tablet Take 400 mg by mouth daily. Use for headaches due to olaparib per neurology OTC     metoprolol succinate (TOPROL-XL) 25 MG 24 hr tablet Take 25 mg by mouth daily.     Multiple Vitamin (MULTIVITAMIN WITH MINERALS) TABS tablet Take 1 tablet by mouth every evening. Centrum     olaparib (LYNPARZA) 150 MG tablet Take 1 tablet (150 mg total) by mouth 2 (two) times daily. Swallow whole. May take with food to decrease nausea and vomiting. 60 tablet 6   simvastatin (ZOCOR) 20 MG tablet Take 20 mg by mouth every evening.     VENTOLIN HFA 108 (90 BASE) MCG/ACT inhaler Inhale 1-2 puffs into the lungs every 6 (six) hours as needed for shortness of breath or wheezing.     fexofenadine (ALLEGRA) 180 MG tablet Take 180 mg by mouth daily as needed for allergies. (Patient not taking: Reported on 12/13/2021)     fluticasone (FLONASE)  50 MCG/ACT nasal spray Place 2 sprays into both nostrils daily. (Patient not taking: Reported on 12/13/2021) 16 g 2   No current facility-administered medications for this visit.    Drug-Drug Interactions (DDIs) DDIs were evaluated?  Yes Significant DDIs?  No The patient was instructed to speak with their health care provider and/or the oral chemotherapy pharmacist before starting any new drug, including prescription or over the counter, natural / herbal products, or vitamins.  Supportive Care Fever: reviewed the importance of having a thermometer and the Centers for Disease Control and Prevention (CDC) definition of fever which is 100.36F (38C) or higher. Patient should call 24/7 triage at [phone] if experiencing a fever or any other symptoms MDS/AML N/V/D Fatigue Pneumonitis Anemia Headache Neutropenia Arthralgia VTE  Dosing Assessment Hepatic adjustments needed?  No Renal adjustments needed?  No Toxicity adjustments needed?  No The current dosing regimen is appropriate to continue at this time.  Follow-Up Plan Continue olaparib 150 mg by mouth every 12 hours.  Last dose tentatively will be 03/02/2022.  She will see Dr. [name] on 03/04/2022 Continue to monitor for worsening fatigue, headaches, or other symptoms. Can see clinical pharmacy as needed going forward.    Jacqueline Adams participated in the discussion, expressed understanding, and voiced agreement with the above plan. All questions were answered to her satisfaction. The patient was advised to contact the clinic at [phone] with any questions or concerns prior to her return visit.   I spent 30 minutes assessing and educating the patient.  Jacqueline Adams, RPH-CPP, 12/13/2021  9:21 AM   **Disclaimer: This note was dictated with voice recognition software. Similar sounding words can inadvertently be transcribed and this note may contain transcription errors which may not have been corrected upon  publication of note.**

## 2021-12-19 ENCOUNTER — Telehealth: Payer: Self-pay | Admitting: Pharmacist

## 2021-12-19 NOTE — Telephone Encounter (Signed)
Scheduled appointment per 11/2 los. Left voicemail.

## 2022-01-16 ENCOUNTER — Telehealth: Payer: Self-pay | Admitting: *Deleted

## 2022-01-16 NOTE — Telephone Encounter (Signed)
Received fax from [place] that [place] approved 01/16/22-01/17/23. PA# Dianna Rossetti [id number]

## 2022-01-16 NOTE — Telephone Encounter (Signed)
Faxed completed/signed PA Avonex to [facility] at [phone]. Received fax confirmation, waiting on determination.

## 2022-01-21 ENCOUNTER — Telehealth: Payer: Self-pay | Admitting: Family Medicine

## 2022-01-21 DIAGNOSIS — W19XXXA Unspecified fall, initial encounter: Secondary | ICD-10-CM

## 2022-01-21 NOTE — Telephone Encounter (Signed)
LVM for pt to call office back. Advised we are recommending she proceed to ER for further evaluation/treatment d/t ongoing sx post fall.

## 2022-01-21 NOTE — Telephone Encounter (Signed)
Pt said, on 01/09/22 going up the stairs and dropped something and turned to go back downstairs and fell 3  steps from the bottom. Turned and fell and landed on left side and head hit the front door. Now have constant switching on left side of face that started on Saturday, 01/19/22. Would like a call back if have a earlier appt available.

## 2022-01-22 NOTE — Telephone Encounter (Signed)
Called and spoke w/ pt. She fell and hit head on door, fell to the ground. She states twitching resolved yesterday. Still had headache but headache has been present since starting chemo drug and has not worsened post fall. I placed her on hold and spoke with Dr. Felecia Shelling. He would like to order CT head wo for further evaluation and have her keep PCP f/u next week Tuesday as scheduled. I relayed this to pt. She was ok to go to [facility] for CT. Aware once insurance approved, someone will contact her to schedule. She will call back if she has any new/worsening sx in the meantime.

## 2022-01-24 ENCOUNTER — Telehealth: Payer: Self-pay | Admitting: Neurology

## 2022-01-24 NOTE — Telephone Encounter (Signed)
[facility] Case Number: [id number] sent to GI

## 2022-01-25 ENCOUNTER — Telehealth: Payer: Self-pay | Admitting: Pharmacy Technician

## 2022-01-25 NOTE — Telephone Encounter (Signed)
Oral Oncology Patient Advocate Encounter  Prior Authorization for Jacqueline Adams has been approved.    PA# [id number] Effective dates: 01/24/22 through 02/24/22  Patient may continue to fill as normal.    Jacqueline Adams, CPhT-Adv Oncology Pharmacy Patient [place] Direct Number: [phone]  Fax: [phone]

## 2022-01-28 ENCOUNTER — Ambulatory Visit
Admission: RE | Admit: 2022-01-28 | Discharge: 2022-01-28 | Disposition: A | Discharge status: Home / Self Care | Payer: 59 | Source: Ambulatory Visit | Attending: Neurology | Admitting: Neurology

## 2022-01-28 ENCOUNTER — Ambulatory Visit: Payer: 59 | Attending: Hematology and Oncology

## 2022-01-28 VITALS — Wt 227.5 lb

## 2022-01-28 DIAGNOSIS — W19XXXA Unspecified fall, initial encounter: Secondary | ICD-10-CM | POA: Diagnosis not present

## 2022-01-28 DIAGNOSIS — R519 Headache, unspecified: Secondary | ICD-10-CM | POA: Diagnosis not present

## 2022-01-28 DIAGNOSIS — Z483 Aftercare following surgery for neoplasm: Secondary | ICD-10-CM | POA: Insufficient documentation

## 2022-01-28 NOTE — Therapy (Signed)
OUTPATIENT PHYSICAL THERAPY SOZO SCREENING NOTE   Patient Name: Jacqueline Adams MRN: [id number] DOB:1960/05/02, 61 y.o., female Today's Date: 01/28/2022  PCP: Robyne Peers, MD REFERRING PROVIDER: Nicholas Lose, MD   PT End of Session - 01/28/22 [phone]     Visit Number 24   # unchanged due to screen only   PT Start Time 1027    PT Stop Time 0838    PT Time Calculation (min) 4 min    Activity Tolerance Patient tolerated treatment well    Behavior During Therapy [facility] for tasks assessed/performed             Past Medical History:  Diagnosis Date   BRCA2 gene mutation positive in female    Breast cancer ([place]) 01/2020   right breast IDC   Bronchitis    Cervical spondylarthritis    Headache(784.0)    High cholesterol    Hypertension    Leukocytosis 2009   Multiple sclerosis ([place]) 2009   followed by Dr. Erling Cruz   Neuromuscular disorder [employer])    Pneumonia    Rhinitis    Seasonal allergies    Sinusitis    Past Surgical History:  Procedure Laterality Date   ABDOMINAL HYSTERECTOMY  2006   uterus, cervix, portion of fallopian tubes for AUB   BREAST LUMPECTOMY WITH RADIOACTIVE SEED AND SENTINEL LYMPH NODE BIOPSY Right 08/08/2020   Procedure: RIGHT BREAST LUMPECTOMY WITH RADIOACTIVE SEED X 3 AND SENTINEL LYMPH NODE BIOPSY;  Surgeon: Stark Klein, MD;  Location: [facility];  Service: General;  Laterality: Right;   CESAREAN SECTION     x 3   CORRECTION HAMMER TOE Bilateral    PORT-A-CATH REMOVAL Right 09/21/2020   Procedure: REMOVAL PORT-A-CATH;  Surgeon: Stark Klein, MD;  Location: [facility];  Service: General;  Laterality: Right;   PORTACATH PLACEMENT Right 02/22/2020   Procedure: RIGHT SUBCLAVIAN VEIN PORT PLACEMENT;  Surgeon: Donnie Mesa, MD;  Location: [facility];  Service: General;  Laterality: Right;   ROBOTIC ASSISTED BILATERAL SALPINGO OOPHERECTOMY Bilateral 01/30/2021   Procedure: XI ROBOTIC ASSISTED BILATERAL SALPINGO OOPHORECTOMY, LYSIS OF  ADHESIONS, 1 HOUR;  Surgeon: Lafonda Mosses, MD;  Location: [facility];  Service: Gynecology;  Laterality: Bilateral;   Patient Active Problem List   Diagnosis Date Noted   Bilateral ovarian cysts    Malignant neoplasm of upper-outer quadrant of right breast in female, estrogen receptor negative ([place]) 01/10/2020   High risk medication use 03/25/2019   Microcytic anemia 12/31/2017   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 UACS 12/16/2017   History of pneumonia 11/12/2017   Morbid obesity ([place]) 08/19/2016   Numbness 05/09/2015   Other fatigue 05/09/2015   Arthralgia of hip 11/16/2014   Low back pain with sciatica 11/16/2014   Urinary frequency 11/16/2014   Encounter for general adult medical examination without abnormal findings 06/07/2014   DOE (dyspnea on exertion) 04/27/2014   Atypical chest pain 04/27/2014   Dyslipidemia 04/27/2014   HLD (hyperlipidemia) 02/14/2014   Cervical spondylosis without myelopathy 07/28/2013   Cervical pain 07/28/2013   CFIDS (chronic fatigue and immune dysfunction syndrome) ([place]) 07/28/2013   Chronic infection of sinus 07/28/2013   Headache, migraine 07/28/2013   DS (disseminated sclerosis) ([place]) 07/28/2013   Spasms of the hands or feet 07/28/2013   DDD (degenerative disc disease), cervical 07/28/2013   Menopausal symptom 07/28/2013   Taking drug for chronic disease 07/28/2013   Vitamin D deficiency 07/28/2013   Degeneration of intervertebral disc of cervical region 07/28/2013   Cough  04/06/2013   BRCA gene mutation positive in female 06/12/2011   Genetic susceptibility to malignant neoplasm of breast 06/12/2011   Multiple sclerosis ([place])    Leukocytosis     REFERRING DIAG: right breast cancer at risk for lymphedema  THERAPY DIAG: Aftercare following surgery for neoplasm  PERTINENT HISTORY: Patient was diagnosed on 12/23/2019 with 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 She underwent neoadjuvant chemotherapy 02/23/2020 -  07/05/2020 followed by a right lumpectomy and sentinel node biopsy (3 negative nodes) on 08/08/2020. Completed radiation.  She has relapsing remitting multiple sclerosis since 2009  PRECAUTIONS: right UE Lymphedema risk, None  SUBJECTIVE: Pt returns for her 3 month L-Dex screen.   PAIN:  Are you having pain? No  SOZO SCREENING: Patient was assessed today using the SOZO machine to determine the lymphedema index score. This was compared to her baseline score. It was determined that she is within the recommended range when compared to her baseline and no further action is needed at this time. She will continue SOZO screenings. These are done every 3 months for 2 years post operatively followed by every 6 months for 2 years, and then annually.   L-DEX FLOWSHEETS - 01/28/22 0800       L-DEX LYMPHEDEMA SCREENING   Measurement Type Unilateral    L-DEX MEASUREMENT EXTREMITY Upper Extremity    POSITION  Standing    DOMINANT SIDE Right    At Risk Side Right    BASELINE SCORE (UNILATERAL) -0.3    L-DEX SCORE (UNILATERAL) 3.6    VALUE CHANGE (UNILAT) 3.9              Otelia Limes, PTA 01/28/2022, 8:37 AM

## 2022-02-19 ENCOUNTER — Inpatient Hospital Stay: Payer: 59 | Attending: Hematology and Oncology | Admitting: Pharmacist

## 2022-02-19 DIAGNOSIS — C50411 Malignant neoplasm of upper-outer quadrant of right female breast: Secondary | ICD-10-CM | POA: Insufficient documentation

## 2022-02-19 DIAGNOSIS — Z171 Estrogen receptor negative status [ER-]: Secondary | ICD-10-CM | POA: Insufficient documentation

## 2022-02-19 DIAGNOSIS — Z9221 Personal history of antineoplastic chemotherapy: Secondary | ICD-10-CM | POA: Insufficient documentation

## 2022-02-19 DIAGNOSIS — Z923 Personal history of irradiation: Secondary | ICD-10-CM | POA: Insufficient documentation

## 2022-02-19 DIAGNOSIS — Z79899 Other long term (current) drug therapy: Secondary | ICD-10-CM | POA: Insufficient documentation

## 2022-02-19 NOTE — Progress Notes (Signed)
[place]       Telephone: [phone]?Fax: [phone]   Oncology Clinical Pharmacist Practitioner Progress Note  Jacqueline Adams is a 62 y.o. female with a diagnosis of breast cancer currently on adjuvant olaparib under the care of Dr. Nicholas Lose.   I connected with Jacqueline Adams today by telephone and verified that I was speaking with the correct person using two patient identifiers.   Other persons participating in the visit and their role in the encounter: none   Patient's location: home  Provider's location: clinic  Jacqueline Adams was contacted by clinical pharmacy after receiving a message from [employer] inquiring if Jacqueline Adams was still on olaparib as she had not refilled the olaparib for some time. Jacqueline Adams had been on olaparib 300 mg BID but had a dose reduction to 150 mg BID due to side effects. During our conversation today, it was reported that she still has several bottles left of the olaparib so there was no need for her to refill the medication. She had spoken to [facility] about this when discussing her other medications but it does not appear that the oncology division of [facility] was notified.  Clinical pharmacy contacted [place] back and relayed this information to [place]. Jacqueline Adams sees Dr. [name] on 03/05/22 and will likely be stopping her olaparib on 03/02/22 which will make 1 year in the adjuvant setting.  Jacqueline Adams participated in the discussion, expressed understanding, and voiced agreement with the above plan. All questions were answered to her satisfaction. The patient was advised to contact the clinic at [phone] with any questions or concerns prior to her return visit.  Clinical pharmacy will continue to [place] and Dr. Nicholas Lose as needed.  Raina Mina, RPH-CPP,  02/19/2022  10:45 AM   **Disclaimer: This note was dictated with voice recognition  software. Similar sounding words can inadvertently be transcribed and this note may contain transcription errors which may not have been corrected upon publication of note.**

## 2022-02-20 ENCOUNTER — Other Ambulatory Visit: Payer: Self-pay | Admitting: *Deleted

## 2022-02-20 ENCOUNTER — Encounter: Payer: Self-pay | Admitting: Neurology

## 2022-02-20 ENCOUNTER — Ambulatory Visit: Payer: 59 | Admitting: Neurology

## 2022-02-20 VITALS — BP 127/73 | HR 95 | Ht 64.0 in | Wt 230.5 lb

## 2022-02-20 DIAGNOSIS — G35 Multiple sclerosis: Secondary | ICD-10-CM

## 2022-02-20 DIAGNOSIS — W19XXXA Unspecified fall, initial encounter: Secondary | ICD-10-CM | POA: Diagnosis not present

## 2022-02-20 DIAGNOSIS — Z79899 Other long term (current) drug therapy: Secondary | ICD-10-CM

## 2022-02-20 DIAGNOSIS — R5383 Other fatigue: Secondary | ICD-10-CM | POA: Diagnosis not present

## 2022-02-20 MED ORDER — AVONEX PREFILLED 30 MCG/0.5ML IM PSKT: PREFILLED_SYRINGE | INTRAMUSCULAR | 3 refills | Status: DC

## 2022-02-20 NOTE — Progress Notes (Signed)
[facility]  PATIENT: Jacqueline Adams DOB: 1961-01-07  REFERRING DOCTOR OR PCP:  Rolan Lipa  _________________________________   HISTORICAL  CHIEF COMPLAINT:  Chief Complaint  Patient presents with   Follow-up    Room 10, MS, most recent fall 02/05/22, wearing knee brace due fall.     HISTORY OF PRESENT ILLNESS:   Jacqueline Adams is a 62 y.o. woman with relapsing remitting MS diagnosed in 2009.    Update  02/20/2022: She is on Avonex and tolerates it well.    She is getting the drug through an assistance.  She tolerates it well.  No injection reactions.     She gets ho flashes sometimes after injections that are not troublesome.   She had 2 falls in December- one she turned around on steps to pick up something she dropped and she fell hitting the door frame with  her head.   No LOC but she felt funny a few minutes.    A second one 02/05/2022, she stepped with her right foot while wearing Crocs in wet weather and the foot slid out from under her.  She hurt her knee with  that one.   CT 01/28/2022 showed no acute change.  Gait does well on well on flat surfaces and could easily walk 2 miles.    She usually goes downstairs without the banister.   Right leg is a little weaker than left (normal). .  She has sciatica type pain shooting pain in her right leg at times but this is not worsening and not bad enough to treat.   Bladder is doing well.   Vision is fine.    She denies much fatigue.   Her sleep pattern is better since she stopped chemo.    She is walking some everyday.   She denies depression.and tried to keep a positive attitude.  Cognition is doing well.    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    She was on carboplatin and paclitaxel.   She is on a pill now.   She gets shooting pain in her right hand/arm at times only.    She finished radiation 12/2020.       MS History:  In 2009, she had the onset of  right arm and leg tingling and clumsiness. At the time, she was seeing Dr. Earley Favor for migraines and he performed a nerve conduction study only showing mild carpal tunnel syndrome. Symptoms worsened the next day and she was referred to Dr. Erling Cruz. An MRI of the brain, cervical spine and a lumbar puncture was performed. The brain MRI showed a large left frontoparietal periventricular focus and several other foci. CSF was consistent with multiple sclerosis.  MRI of the cervical spine was normal. She was started on Avonex.   She was switched to Tecfidera in late 2014 as she had one new focus on her 2014 MRI. She switched back to Avonex in 2016..  Imaging MRI of the brain 04/29/2018 showed T2/FLAIR hyperintense foci in the hemispheres in a pattern consistent with demyelination/MS.  None of the foci were acute and there were no new lesions compared to the MRI from 2018.  MRI of the brain 05/22/2016 showed T2/FLAIR foci in the hemispheres consistent with MS.  There were no acute findings.  No new lesions compared to 05/14/2015  MRI of the cervical spine 05/22/2016 showed a normal spinal cord and mild disc degenerative changes but no nerve root  compression or spinal stenosis.  MRO of the brain 09/19/2020 showed Several T2/FLAIR hyperintense foci in the left hemisphere in a pattern consistent with chronic demyelinating plaque associated with multiple sclerosis.  None of the foci enhance.  Compared to the MRI from 04/29/2018, there are no new lesions.  MRI cervical spine showed a normal spinal cord and DJD but no spinal stenosis or nerve root compression  REVIEW OF SYSTEMS: Constitutional: No fevers, chills, sweats, or change in appetite.   She has fatigue Eyes: No visual changes, double vision, eye pain.   Being followed for borderline high intraocular pressure. Ear, nose and throat: No hearing loss, ear pain/.    Respiratory:  No shortness of breath at rest or with exertion.   No wheezes.   She has frequent  bronchitis. Cardiovascular: No chest pain, palpitations GastrointestinaI: No nausea, vomiting, diarrhea, abdominal pain, fecal incontinence Genitourinary:  No dysuria.  She has urgency and or frequency.  2-3 x  nocturia. Musculoskeletal:  No neck pain, back pain Integumentary: No rash, pruritus, skin lesions Neurological: as above Psychiatric: No depression at this time.  No anxiety Endocrine: No palpitations, diaphoresis, change in appetite, change in weigh or increased thirst Hematologic/Lymphatic:  No anemia, purpura, petechiae. Allergic/Immunologic: No itchy/runny eyes, nasal congestion, recent allergic reactions, rashes  ALLERGIES: Allergies  Allergen Reactions   Nabumetone Swelling and Hives   Procaine Swelling   Shellfish Allergy Anaphylaxis    HOME MEDICATIONS:  Current Outpatient Medications:    AVONEX PREFILLED 30 MCG/0.5ML PSKT injection, INJECT ONE SYRINGE INTRAMUSCULARLY ONCE WEEKLY. REFRIGERATE. PROTECT FROM LIGHT. ALLOW TO WARM TO ROOM TEMPERATURE PRIOR TO USE., Disp: 3 each, Rfl: 3   Calcium Carb-Cholecalciferol (CALCIUM 600+D3 PO), Take 1 tablet by mouth in the morning and at bedtime., Disp: , Rfl:    ELDERBERRY PO, Take by mouth. Patient report taking 2 gummies daily, Disp: , Rfl:    fexofenadine (ALLEGRA) 180 MG tablet, Take 180 mg by mouth daily as needed for allergies., Disp: , Rfl:    fluticasone (FLONASE) 50 MCG/ACT nasal spray, Place 2 sprays into both nostrils daily., Disp: 16 g, Rfl: 2   ibuprofen (ADVIL) 200 MG tablet, Take 400 mg by mouth every Friday. W/Avonex injection, Disp: , Rfl:    KLOR-CON M20 20 MEQ tablet, TAKE 1 TABLET BY MOUTH EVERY DAY IN THE MORNING, Disp: 90 tablet, Rfl: 1   magnesium oxide (MAG-OX) 400 (240 Mg) MG tablet, Take 400 mg by mouth daily. Use for headaches due to olaparib per neurology OTC, Disp: , Rfl:    metoprolol succinate (TOPROL-XL) 25 MG 24 hr tablet, Take 25 mg by mouth daily., Disp: , Rfl:    Multiple Vitamin (MULTIVITAMIN  WITH MINERALS) TABS tablet, Take 1 tablet by mouth every evening. Centrum, Disp: , Rfl:    olaparib (LYNPARZA) 150 MG tablet, Take 1 tablet (150 mg total) by mouth 2 (two) times daily. Swallow whole. May take with food to decrease nausea and vomiting., Disp: 60 tablet, Rfl: 6   simvastatin (ZOCOR) 20 MG tablet, Take 20 mg by mouth every evening., Disp: , Rfl:    VENTOLIN HFA 108 (90 BASE) MCG/ACT inhaler, Inhale 1-2 puffs into the lungs every 6 (six) hours as needed for shortness of breath or wheezing., Disp: , Rfl:   PAST MEDICAL HISTORY: Past Medical History:  Diagnosis Date   BRCA2 gene mutation positive in female    Breast cancer ([place]) 01/2020   right breast IDC   Bronchitis    Cervical spondylarthritis  Headache(784.0)    High cholesterol    Hypertension    Leukocytosis 2009   Multiple sclerosis ([place]) 2009   followed by Dr. Erling Cruz   Neuromuscular disorder [facility])    Pneumonia    Rhinitis    Seasonal allergies    Sinusitis     PAST SURGICAL HISTORY: Past Surgical History:  Procedure Laterality Date   ABDOMINAL HYSTERECTOMY  2006   uterus, cervix, portion of fallopian tubes for AUB   BREAST LUMPECTOMY WITH RADIOACTIVE SEED AND SENTINEL LYMPH NODE BIOPSY Right 08/08/2020   Procedure: RIGHT BREAST LUMPECTOMY WITH RADIOACTIVE SEED X 3 AND SENTINEL LYMPH NODE BIOPSY;  Surgeon: Stark Klein, MD;  Location: [place];  Service: General;  Laterality: Right;   CESAREAN SECTION     x 3   CORRECTION HAMMER TOE Bilateral    PORT-A-CATH REMOVAL Right 09/21/2020   Procedure: REMOVAL PORT-A-CATH;  Surgeon: Stark Klein, MD;  Location: [place];  Service: General;  Laterality: Right;   PORTACATH PLACEMENT Right 02/22/2020   Procedure: RIGHT SUBCLAVIAN VEIN PORT PLACEMENT;  Surgeon: Donnie Mesa, MD;  Location: [place];  Service: General;  Laterality: Right;   ROBOTIC ASSISTED BILATERAL SALPINGO OOPHERECTOMY Bilateral 01/30/2021   Procedure: XI ROBOTIC ASSISTED BILATERAL  SALPINGO OOPHORECTOMY, LYSIS OF ADHESIONS, 1 HOUR;  Surgeon: Lafonda Mosses, MD;  Location: [facility];  Service: Gynecology;  Laterality: Bilateral;    FAMILY HISTORY: Family History  Problem Relation Age of Onset   Cancer Mother 19       ovarian cancer; now with suspected breast cancer   Diabetes Mother        also HTN   Breast cancer Mother    Clotting disorder Father    Prostate cancer Father    Colon cancer Sister 71       anal cancer (2014)   Lung cancer Sister 40   Stroke Sister 57   Ovarian cancer Sister 64   BRCA 1/2 Sister    Breast cancer Maternal Aunt    Breast cancer Maternal Aunt    Breast cancer Maternal Aunt    Cancer Maternal Grandmother        unknown female reproductive organ cancer   Endometrial cancer Niece    Pancreatic cancer Neg Hx     SOCIAL HISTORY:  Social History   Socioeconomic History   Marital status: Married    Spouse name: John   Number of children: 2   Years of education: Engineer, agricultural education level: Not on file  Occupational History   Occupation: Print production planner: [employer]  Tobacco Use   Smoking status: Never   Smokeless tobacco: Never  Vaping Use   Vaping Use: Never used  Substance and Sexual Activity   Alcohol use: No   Drug use: No   Sexual activity: Yes    Birth control/protection: Surgical  Other Topics Concern   Not on file  Social History Narrative   Patient lives at home with her spouse.   Caffeine Use: 2 sodas daily   Social Determinants of Health   Financial Resource Strain: Low Risk  (01/12/2020)   Overall Financial Resource Strain (CARDIA)    Difficulty of Paying Living Expenses: Not hard at all  Food Insecurity: No Food Insecurity (01/12/2020)   Hunger Vital Sign    Worried About Running Out of Food in the Last Year: Never true    Ran Out of Food in the Last Year: Never true  Transportation Needs: No  Transportation Needs (01/12/2020)   PRAPARE - Hydrologist  (Medical): No    Lack of Transportation (Non-Medical): No  Physical Activity: Not on file  Stress: Not on file  Social Connections: Socially Integrated (01/12/2020)   Social Connection and Isolation Panel [NHANES]    Frequency of Communication with Friends and Family: More than three times a week    Frequency of Social Gatherings with Friends and Family: Not on file    Attends Religious Services: More than 4 times per year    Active Member of Clubs or Organizations: Not on file    Attends Music therapist: More than 4 times per year    Marital Status: Married  Human resources officer Violence: Not on file     PHYSICAL EXAM  Vitals:   02/20/22 1003  BP: 127/73  Pulse: 95  Weight: 230 lb 8 oz (104.6 kg)  Height: '5\' 4"'$  (1.626 m)    Body mass index is 39.57 kg/m.   General: The patient is well-developed and well-nourished and in no acute distress.  Wearing left knee brace   Neurologic Exam  Mental status: The patient is alert and oriented x 3 at the time of the examination. The patient has apparent normal recent and remote memory, with an apparently normal attention span and concentration ability.   Speech is normal.  Cranial nerves: Extraocular movements are full.  Color vision is symmetric.   Facial strength and sensation was normal.  Trapezius strength is normal.  No obvious hearing deficits are noted.  Motor:  Muscle bulk is normal.   Tone is normal. Strength is  5 / 5 in all 4 extremities.   Sensory: Normal sensation to touch and vibration in bilateral arms and legs.   Coordination: Cerebellar testing reveals good finger-nose-finger and heel-to-shin bilaterally.  Gait and station: Station is normal.  Gait testing was affected by her left knee pain.  Romberg was negative.  Reflexes: Deep tendon reflexes are normal and symmetric in the arms.  Deep tendon reflexes are mildly increased at the knees.       DIAGNOSTIC DATA (LABS, IMAGING, TESTING) - I reviewed patient  records, labs, notes, testing and imaging myself where available.  Lab Results  Component Value Date   WBC 10.9 (H) 12/13/2021   HGB 12.6 12/13/2021   HCT 37.5 12/13/2021   MCV 83.9 12/13/2021   PLT 247 12/13/2021      Component Value Date/Time   NA 141 12/13/2021 0843   NA 142 05/05/2020 1625   NA 143 11/22/2013 1514   K 3.5 12/13/2021 0843   K 3.1 (L) 11/22/2013 1514   CL 105 12/13/2021 0843   CL 105 02/18/2012 1207   CO2 29 12/13/2021 0843   CO2 25 11/22/2013 1514   GLUCOSE 111 (H) 12/13/2021 0843   GLUCOSE 105 11/22/2013 1514   GLUCOSE 96 02/18/2012 1207   BUN 7 12/13/2021 0843   BUN 8 05/05/2020 1625   BUN 6.6 (L) 11/22/2013 1514   CREATININE 0.75 12/13/2021 0843   CREATININE 0.8 11/22/2013 1514   CALCIUM 9.1 12/13/2021 0843   CALCIUM 9.0 11/22/2013 1514   PROT 7.5 12/13/2021 0843   PROT 7.0 09/22/2019 0840   PROT 7.4 11/22/2013 1514   ALBUMIN 4.0 12/13/2021 0843   ALBUMIN 4.0 09/22/2019 0840   ALBUMIN 3.3 (L) 11/22/2013 1514   AST 19 12/13/2021 0843   AST 15 11/22/2013 1514   ALT 16 12/13/2021 0843   ALT 13 11/22/2013  1660   AYOKHTX 77 12/13/2021 0843   ALKPHOS 91 11/22/2013 1514   BILITOT 0.5 12/13/2021 0843   BILITOT 0.23 11/22/2013 1514   GFRNONAA >60 12/13/2021 0843   GFRAA 113 09/22/2019 0840       ASSESSMENT AND PLAN    1. Multiple sclerosis ([place])   2. Fall, initial encounter   3. High risk medication use   4. Other fatigue        1.  Continue Avonex..   She has recent blood work and does not need labs today.    MRI has been stable. 2.  Continue vit D supplementation, if low today increase amount.   3.  Try to stay active and exercises as tolerated.   Use bannister on stairs 4.  She is continuing to follow-up with oncology for her breast cancer.   5.   She will return to see [place] in 6 months or sooner if there are new or worsening neurologic symptoms.   Cadel Stairs A. Felecia Shelling, MD, PhD 05/25/2393, 32:02 AM Certified in Neurology, Clinical  Neurophysiology, Sleep Medicine, Pain Medicine and Neuroimaging  [facility] [address], [place], [place] [address] [phone]

## 2022-03-05 ENCOUNTER — Inpatient Hospital Stay: Payer: 59

## 2022-03-05 ENCOUNTER — Inpatient Hospital Stay: Payer: 59 | Admitting: Hematology and Oncology

## 2022-03-05 VITALS — BP 123/79 | HR 84 | Temp 97.5°F | Resp 17 | Wt 231.1 lb

## 2022-03-05 DIAGNOSIS — Z171 Estrogen receptor negative status [ER-]: Secondary | ICD-10-CM | POA: Diagnosis not present

## 2022-03-05 DIAGNOSIS — C50411 Malignant neoplasm of upper-outer quadrant of right female breast: Secondary | ICD-10-CM | POA: Diagnosis not present

## 2022-03-05 DIAGNOSIS — Z79899 Other long term (current) drug therapy: Secondary | ICD-10-CM | POA: Diagnosis not present

## 2022-03-05 DIAGNOSIS — Z923 Personal history of irradiation: Secondary | ICD-10-CM | POA: Diagnosis not present

## 2022-03-05 DIAGNOSIS — Z9221 Personal history of antineoplastic chemotherapy: Secondary | ICD-10-CM | POA: Diagnosis not present

## 2022-03-05 LAB — CMP (CANCER CENTER ONLY)
ALT: 22 U/L (ref 0–44)
AST: 22 U/L (ref 15–41)
Albumin: 3.9 g/dL (ref 3.5–5.0)
Alkaline Phosphatase: 87 U/L (ref 38–126)
Anion gap: 6 (ref 5–15)
BUN: 7 mg/dL (ref 6–20)
CO2: 31 mmol/L (ref 22–32)
Calcium: 9.5 mg/dL (ref 8.9–10.3)
Chloride: 105 mmol/L (ref 98–111)
Creatinine: 0.77 mg/dL (ref 0.44–1.00)
GFR, Estimated: >60 mL/min (ref >60)
Glucose, Bld: 88 mg/dL (ref 70–99)
Potassium: 3.9 mmol/L (ref 3.5–5.1)
Sodium: 142 mmol/L (ref 135–145)
Total Bilirubin: 0.4 mg/dL (ref 0.3–1.2)
Total Protein: 7.1 g/dL (ref 6.5–8.1)

## 2022-03-05 LAB — CBC WITH DIFFERENTIAL (CANCER CENTER ONLY)
Abs Immature Granulocytes: 0.03 10*3/uL (ref 0.00–0.07)
Basophils Absolute: 0.1 10*3/uL (ref 0.0–0.1)
Basophils Relative: 0 %
Eosinophils Absolute: 0.2 10*3/uL (ref 0.0–0.5)
Eosinophils Relative: 2 %
HCT: 37.7 % (ref 36.0–46.0)
Hemoglobin: 12.7 g/dL (ref 12.0–15.0)
Immature Granulocytes: 0 %
Lymphocytes Relative: 29 %
Lymphs Abs: 3.4 10*3/uL (ref 0.7–4.0)
MCH: 28.3 pg (ref 26.0–34.0)
MCHC: 33.7 g/dL (ref 30.0–36.0)
MCV: 84.2 fL (ref 80.0–100.0)
Monocytes Absolute: 0.6 10*3/uL (ref 0.1–1.0)
Monocytes Relative: 5 %
Neutro Abs: 7.5 10*3/uL (ref 1.7–7.7)
Neutrophils Relative %: 64 %
Platelet Count: 262 10*3/uL (ref 150–400)
RBC: 4.48 MIL/uL (ref 3.87–5.11)
RDW: 16 % — ABNORMAL HIGH (ref 11.5–15.5)
WBC Count: 11.8 10*3/uL — ABNORMAL HIGH (ref 4.0–10.5)
nRBC: 0 % (ref 0.0–0.2)

## 2022-03-05 NOTE — Progress Notes (Signed)
Patient Care Team: Robyne Peers, MD as PCP - General (Family Medicine) Jerrell Belfast, MD Nicholas Lose, MD as Consulting Physician (Hematology and Oncology) Kyung Rudd, MD as Consulting Physician (Radiation Oncology) Sater, Nanine Means, MD (Neurology) Stark Klein, MD as Consulting Physician (General Surgery) Josue Hector, MD as Consulting Physician (Cardiology) Raina Mina, RPH-CPP as Pharmacist (Hematology and Oncology)  DIAGNOSIS:  Encounter Diagnosis  Name Primary?   Malignant neoplasm of upper-outer quadrant of right breast in female, estrogen receptor negative ([place]) Yes    SUMMARY OF ONCOLOGIC HISTORY: Oncology History  Malignant neoplasm of upper-outer quadrant of right breast in female, estrogen receptor negative ([place])  04/12/2011 Genetic Testing   + BRCA 2 mutation on Q486X (1684C>T) BRCA2.    01/10/2020 Initial Diagnosis   Palpable right breast mass. Mammogram showed a 1.8cm mass at the 10:30 position 2cm from the nipple, a 1.5cm mass at the 10:30 position 7cm from the nipple, and indeterminate calcifications between the two masses in the right breast. 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    01/12/2020 Cancer Staging   Staging form: Breast, AJCC 8th Edition - Clinical stage from 01/12/2020: Stage IB (cT1c, cN0, cM0, G3, ER-, PR-, HER2-) - Signed by Nicholas Lose, MD on 02/03/2020   02/23/2020 - 07/05/2020 Chemotherapy   Patient is on Treatment Plan : BREAST Dose Dense AC q14d / CARBOplatin D1 + PACLitaxel D1,8,15 q21d     08/08/2020 Surgery   Right breast lumpectomy: scattered foci of DCIS with calcifications, intermediate to high-grade, largest measuring 0.3 cm, no residual invasive carcinoma, resection margins negative for DCIS, and all 3 axillary nodes negative for carcinoma   11/21/2020 - 12/18/2020 Radiation Therapy   11/21/2020 through 12/18/2020 Site Technique Total Dose  (Gy) Dose per Fx (Gy) Completed Fx Beam Energies  Breast, Right: Breast_Rt 3D 42.56/42.56 2.66 16/16 10X  Breast, Right: Breast_Rt_Bst 3D 8/8 2 4/4 6X, 10X    03/04/2021 Miscellaneous   Olaparib started at '150mg'$  twice daily Increased to '300mg'$  BID 04/30/2021     CHIEF COMPLIANT:  Follow-up breast cancer surveillance on [place]    INTERVAL HISTORY: Jacqueline Adams is a 62 y.o with the above mention. She presents to the clinic today for a follow-up. She reports that she had 2 falls she is currently wearing a brace on her right knee. She says she is keeping it elevated and iced, because of the swelling. She had a second fall and hit her head and had a concussion. She said she had a Mri done and everything was fine. Overall she is doing better.   ALLERGIES:  is allergic to nabumetone, procaine, and shellfish allergy.  MEDICATIONS:  Current Outpatient Medications  Medication Sig Dispense Refill   Calcium Carb-Cholecalciferol (CALCIUM 600+D3 PO) Take 1 tablet by mouth in the morning and at bedtime.     ELDERBERRY PO Take by mouth. Patient report taking 2 gummies daily     fexofenadine (ALLEGRA) 180 MG tablet Take 180 mg by mouth daily as needed for allergies.     fluticasone (FLONASE) 50 MCG/ACT nasal spray Place 2 sprays into both nostrils daily. 16 g 2   ibuprofen (ADVIL) 200 MG tablet Take 400 mg by mouth every Friday. W/Avonex injection     interferon beta-1a (AVONEX PREFILLED) 30 MCG/0.5ML PSKT injection INJECT ONE SYRINGE INTRAMUSCULARLY ONCE WEEKLY. REFRIGERATE. PROTECT FROM LIGHT. ALLOW TO WARM TO ROOM TEMPERATURE PRIOR TO USE. 3  each 3   KLOR-CON M20 20 MEQ tablet TAKE 1 TABLET BY MOUTH EVERY DAY IN THE MORNING 90 tablet 1   magnesium oxide (MAG-OX) 400 (240 Mg) MG tablet Take 400 mg by mouth daily. Use for headaches due to olaparib per neurology OTC     metoprolol succinate (TOPROL-XL) 25 MG 24 hr tablet Take 25 mg by mouth daily.     Multiple Vitamin (MULTIVITAMIN WITH MINERALS)  TABS tablet Take 1 tablet by mouth every evening. Centrum     simvastatin (ZOCOR) 20 MG tablet Take 20 mg by mouth every evening.     VENTOLIN HFA 108 (90 BASE) MCG/ACT inhaler Inhale 1-2 puffs into the lungs every 6 (six) hours as needed for shortness of breath or wheezing.     No current facility-administered medications for this visit.    PHYSICAL EXAMINATION: ECOG PERFORMANCE STATUS: 1 - Symptomatic but completely ambulatory  Vitals:   03/05/22 1045  BP: 123/79  Pulse: 84  Resp: 17  Temp: (!) 97.5 F (36.4 C)  SpO2: 99%   Filed Weights   03/05/22 1045  Weight: 231 lb 1 oz (104.8 kg)    BREAST: No palpable masses or nodules in either right or left breasts. No palpable axillary supraclavicular or infraclavicular adenopathy no breast tenderness or nipple discharge. (exam performed in the presence of a chaperone)  LABORATORY DATA:  I have reviewed the data as listed    Latest Ref Rng & Units 12/13/2021    8:43 AM 10/18/2021    8:46 AM 08/23/2021    8:25 AM  CMP  Glucose 70 - 99 mg/dL 111  115  114   BUN 6 - 20 mg/dL '7  7  7   '$ Creatinine 0.44 - 1.00 mg/dL 0.75  0.72  0.69   Sodium 135 - 145 mmol/L 141  141  141   Potassium 3.5 - 5.1 mmol/L 3.5  3.5  3.5   Chloride 98 - 111 mmol/L 105  107  105   CO2 22 - 32 mmol/L '29  28  30   '$ Calcium 8.9 - 10.3 mg/dL 9.1  9.1  9.4   Total Protein 6.5 - 8.1 g/dL 7.5  7.2  7.1   Total Bilirubin 0.3 - 1.2 mg/dL 0.5  0.5  0.4   Alkaline Phos 38 - 126 U/L 78  84  82   AST 15 - 41 U/L '19  17  18   '$ ALT 0 - 44 U/L '16  14  17     '$ Lab Results  Component Value Date   WBC 11.8 (H) 03/05/2022   HGB 12.7 03/05/2022   HCT 37.7 03/05/2022   MCV 84.2 03/05/2022   PLT 262 03/05/2022   NEUTROABS 7.5 03/05/2022    ASSESSMENT & PLAN:  Malignant neoplasm of upper-outer quadrant of right breast in female, estrogen receptor negative ([place]) 01/10/2020:Palpable right breast mass. Mammogram showed a 1.8cm mass at the 10:30 position 2cm from the nipple, a  1.5cm mass at the 10:30 position 7cm from the nipple, and indeterminate calcifications between the two masses in the right breast. 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  BRCA2 Mutation   Treatment plan: 1. Neoadjuvant chemotherapy with Adriamycin and Cytoxan every 2 weeks 4 followed by Taxol weekly 12 with carboplatin and  every 3 weeks x4 completed 07/05/2020 2. 08/08/20 breast conserving surgery with sentinel lymph node study : scattered foci of DCIS, margins  Neg, 0/3 LN Neg 3. Adjuvant radiation therapy 11/22/20- 12/18/20 4. Adjuvant olaparib started 02/20/21 [facility] nausea study   Breast MRI 01/14/2020: 2 cm spiculated mass UOQ right breast, 2.1 cm spiculated mass UOQ right breast, biopsy-proven malignancies.  7 mm suspicious enhancing mass: Biopsy 02/02/2020: High-grade DCIS ----------------------------------------------------------------------------------------------------------------------------------------------- Olaparib: Lonie Peak is a PARP inhibitor started 02/20/2021 (1 year of total therapy)   [place] trial showed that the recurrence rate improves with olaparib.  86% versus 77%. Dose: 300 mg twice daily reduced to 150 p.o. twice daily starting 07/12/2021 (this will be her dose for the rest of the treatment)   Knee injury: Patient was getting an MRI to evaluate the knee.  Breast cancer surveillance:  1.  Mammograms were done in 04/02/2021: Benign 2. breast MRI 10/04/2021: Indeterminate 4 mm mass left outer lower quadrant: MRI biopsy: Fibrocystic change with usual ductal hyperplasia 3.  Breast exam 03/05/2022: Benign   she had prophylactic oophorectomy.   Brain MRI: 07/11/21: Findings of MS. No brain mets Return to clinic in 1 year for follow-up    No orders of the defined types were placed in this encounter.  The patient has a good understanding of the overall plan. she agrees with it. she will call with any  problems that may develop before the next visit here. Total time spent: 30 mins including face to face time and time spent for planning, charting and co-ordination of care   Harriette Ohara, MD 03/05/22    I Gardiner Coins am acting as a Education administrator for [employer]  I have reviewed the above documentation for accuracy and completeness, and I agree with the above.

## 2022-03-05 NOTE — Assessment & Plan Note (Signed)
01/10/2020:Palpable right breast mass. Mammogram showed a 1.8cm mass at the 10:30 position 2cm from the nipple, a 1.5cm mass at the 10:30 position 7cm from the nipple, and indeterminate calcifications between the two masses in the right breast. 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  BRCA2 Mutation   Treatment plan: 1. Neoadjuvant chemotherapy with Adriamycin and Cytoxan every 2 weeks 4 followed by Taxol weekly 12 with carboplatin and  every 3 weeks x4 completed 07/05/2020 2. 08/08/20 breast conserving surgery with sentinel lymph node study : scattered foci of DCIS, margins Neg, 0/3 LN Neg 3. Adjuvant radiation therapy 11/22/20- 12/18/20 4. Adjuvant olaparib started 02/20/21 [facility] nausea study   Breast MRI 01/14/2020: 2 cm spiculated mass UOQ right breast, 2.1 cm spiculated mass UOQ right breast, biopsy-proven malignancies.  7 mm suspicious enhancing mass: Biopsy 02/02/2020: High-grade DCIS ----------------------------------------------------------------------------------------------------------------------------------------------- Olaparib: Jacqueline Adams is a PARP inhibitor started 02/20/2021 (1 year of total therapy)   Jacqueline Adams trial showed that the recurrence rate improves with olaparib.  86% versus 77%. Dose: 300 mg twice daily reduced to 150 p.o. twice daily starting 07/12/2021 (this will be her dose for the rest of the treatment)   Adverse effects:  1.  Very mild nausea 2. fatigue 3. Rec Headaches:  We reduced the dosage to 150 p.o. twice daily.  She is doing much better With this she completes 1 year of [place]  We will discontinue it at this time.  Breast cancer surveillance:  1.  Mammograms were done in 04/02/2021: Benign 2. breast MRI 10/04/2021: Indeterminate 4 mm mass left outer lower quadrant: MRI biopsy: Fibrocystic change with usual ductal hyperplasia 3.  Breast exam 03/05/2022: Benign   she had prophylactic  oophorectomy.   Brain MRI: 07/11/21: Findings of MS. No brain mets Return to clinic in 1 year for follow-up

## 2022-04-03 ENCOUNTER — Ambulatory Visit
Admission: RE | Admit: 2022-04-03 | Discharge: 2022-04-03 | Disposition: A | Discharge status: Home / Self Care | Payer: 59 | Source: Ambulatory Visit | Attending: Adult Health | Admitting: Adult Health

## 2022-04-03 ENCOUNTER — Ambulatory Visit
Admission: RE | Admit: 2022-04-03 | Discharge: 2022-04-03 | Disposition: A | Discharge status: Home / Self Care | Payer: 59 | Source: Ambulatory Visit | Attending: Hematology and Oncology | Admitting: Hematology and Oncology

## 2022-04-03 DIAGNOSIS — E2839 Other primary ovarian failure: Secondary | ICD-10-CM

## 2022-04-03 DIAGNOSIS — C50411 Malignant neoplasm of upper-outer quadrant of right female breast: Secondary | ICD-10-CM

## 2022-04-03 HISTORY — DX: Personal history of irradiation: Z92.3

## 2022-04-03 HISTORY — DX: Personal history of antineoplastic chemotherapy: Z92.21

## 2022-04-04 ENCOUNTER — Telehealth: Payer: Self-pay | Admitting: *Deleted

## 2022-04-04 NOTE — Telephone Encounter (Signed)
Per Lindsey,NP, called pt with message below. Pt verbalized understanding.

## 2022-04-04 NOTE — Telephone Encounter (Signed)
-----   Message from Gardenia Phlegm, NP sent at 04/04/2022 11:48 AM EST ----- Please let patient know the good news that her bone density testing is normal. ----- Message ----- From: Interface, Rad Results In Sent: 04/03/2022  11:59 AM EST To: Gardenia Phlegm, NP

## 2022-05-06 ENCOUNTER — Ambulatory Visit: Payer: 59 | Attending: Hematology and Oncology

## 2022-05-06 VITALS — Wt 231.0 lb

## 2022-05-06 DIAGNOSIS — Z483 Aftercare following surgery for neoplasm: Secondary | ICD-10-CM | POA: Insufficient documentation

## 2022-05-06 NOTE — Therapy (Signed)
OUTPATIENT PHYSICAL THERAPY SOZO SCREENING NOTE   Patient Name: Jacqueline Adams MRN: [id number] DOB:[birth year], 62 y.o., female Today's Date: 05/06/2022  PCP: Robyne Peers, MD REFERRING PROVIDER: Nicholas Lose, MD   PT End of Session - 05/06/22 [phone]     Visit Number 24   # unchanged due to screen only   PT Start Time 0831    PT Stop Time [id number]    PT Time Calculation (min) 4 min    Activity Tolerance Patient tolerated treatment well    Behavior During Therapy [facility] for tasks assessed/performed             Past Medical History:  Diagnosis Date   BRCA2 gene mutation positive in female    Breast cancer ([place]) 01/2020   right breast IDC   Bronchitis    Cervical spondylarthritis    Headache(784.0)    High cholesterol    Hypertension    Leukocytosis 2009   Multiple sclerosis ([place]) 2009   followed by Dr. Erling Cruz   Neuromuscular disorder [facility])    Personal history of chemotherapy    Personal history of radiation therapy    Pneumonia    Rhinitis    Seasonal allergies    Sinusitis    Past Surgical History:  Procedure Laterality Date   ABDOMINAL HYSTERECTOMY  2006   uterus, cervix, portion of fallopian tubes for AUB   BREAST BIOPSY Left 10/10/2021   BREAST BIOPSY Right 02/02/2020   BREAST BIOPSY Right 01/28/2020   BREAST BIOPSY Right 12/31/2019   times 2   BREAST LUMPECTOMY Right 08/08/2020   BREAST LUMPECTOMY WITH RADIOACTIVE SEED AND SENTINEL LYMPH NODE BIOPSY Right 08/08/2020   Procedure: RIGHT BREAST LUMPECTOMY WITH RADIOACTIVE SEED X 3 AND SENTINEL LYMPH NODE BIOPSY;  Surgeon: Stark Klein, MD;  [place];  Service: General;  Laterality: Right;   CESAREAN SECTION     x 3   CORRECTION HAMMER TOE Bilateral    PORT-A-CATH REMOVAL Right 09/21/2020   Procedure: REMOVAL PORT-A-CATH;  Surgeon: Stark Klein, MD;  Location: [place];  Service: General;  Laterality: Right;   PORTACATH PLACEMENT Right 02/22/2020   Procedure: RIGHT SUBCLAVIAN VEIN PORT PLACEMENT;   Surgeon: Donnie Mesa, MD;  Location: [place];  Service: General;  Laterality: Right;   ROBOTIC ASSISTED BILATERAL SALPINGO OOPHERECTOMY Bilateral 01/30/2021   Procedure: XI ROBOTIC ASSISTED BILATERAL SALPINGO OOPHORECTOMY, LYSIS OF ADHESIONS, 1 HOUR;  Surgeon: Lafonda Mosses, MD;  [facility];  Service: Gynecology;  Laterality: Bilateral;   Patient Active Problem List   Diagnosis Date Noted   Bilateral ovarian cysts    Malignant neoplasm of upper-outer quadrant of right breast in female, estrogen receptor negative ([place]) 01/10/2020   High risk medication use 03/25/2019   Microcytic anemia 12/31/2017   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 UACS 12/16/2017   History of pneumonia 11/12/2017   Morbid obesity ([place]) 08/19/2016   Numbness 05/09/2015   Other fatigue 05/09/2015   Arthralgia of hip 11/16/2014   Low back pain with sciatica 11/16/2014   Urinary frequency 11/16/2014   Encounter for general adult medical examination without abnormal findings 06/07/2014   DOE (dyspnea on exertion) 04/27/2014   Atypical chest pain 04/27/2014   Dyslipidemia 04/27/2014   HLD (hyperlipidemia) 02/14/2014   Cervical spondylosis without myelopathy 07/28/2013   Cervical pain 07/28/2013   CFIDS (chronic fatigue and immune dysfunction syndrome) ([place]) 07/28/2013   Chronic infection of sinus 07/28/2013   Headache, migraine 07/28/2013   DS (disseminated sclerosis) ([place]) 07/28/2013  Spasms of the hands or feet 07/28/2013   DDD (degenerative disc disease), cervical 07/28/2013   Menopausal symptom 07/28/2013   Taking drug for chronic disease 07/28/2013   Vitamin D deficiency 07/28/2013   Degeneration of intervertebral disc of cervical region 07/28/2013   Cough 04/06/2013   BRCA gene mutation positive in female 06/12/2011   Genetic susceptibility to malignant neoplasm of breast 06/12/2011   Multiple sclerosis ([place])    Leukocytosis     REFERRING DIAG: right breast cancer at risk for  lymphedema  THERAPY DIAG: Aftercare following surgery for neoplasm  PERTINENT HISTORY: Patient was diagnosed on 12/23/2019 with 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 She underwent neoadjuvant chemotherapy 02/23/2020 - 07/05/2020 followed by a right lumpectomy and sentinel node biopsy (3 negative nodes) on 08/08/2020. Completed radiation.  She has relapsing remitting multiple sclerosis since 2009  PRECAUTIONS: right UE Lymphedema risk, None  SUBJECTIVE: Pt returns for her 3 month L-Dex screen.   PAIN:  Are you having pain? No  SOZO SCREENING: Patient was assessed today using the SOZO machine to determine the lymphedema index score. This was compared to her baseline score. It was determined that she is within the recommended range when compared to her baseline and no further action is needed at this time. She will continue SOZO screenings. These are done every 3 months for 2 years post operatively followed by every 6 months for 2 years, and then annually.   L-DEX FLOWSHEETS - 05/06/22 0800       L-DEX LYMPHEDEMA SCREENING   Measurement Type Unilateral    L-DEX MEASUREMENT EXTREMITY Upper Extremity    POSITION  Standing    DOMINANT SIDE Right    At Risk Side Right    BASELINE SCORE (UNILATERAL) -0.3    L-DEX SCORE (UNILATERAL) 2    VALUE CHANGE (UNILAT) 2.3              Otelia Limes, PTA 05/06/2022, 8:34 AM

## 2022-05-31 ENCOUNTER — Other Ambulatory Visit: Payer: Self-pay | Admitting: Hematology and Oncology

## 2022-06-07 DIAGNOSIS — R296 Repeated falls: Secondary | ICD-10-CM | POA: Insufficient documentation

## 2022-06-07 DIAGNOSIS — R2689 Other abnormalities of gait and mobility: Secondary | ICD-10-CM | POA: Insufficient documentation

## 2022-06-07 DIAGNOSIS — I890 Lymphedema, not elsewhere classified: Secondary | ICD-10-CM | POA: Insufficient documentation

## 2022-06-10 ENCOUNTER — Other Ambulatory Visit (HOSPITAL_COMMUNITY): Payer: Self-pay | Admitting: General Surgery

## 2022-06-10 DIAGNOSIS — Z1501 Genetic susceptibility to malignant neoplasm of breast: Secondary | ICD-10-CM

## 2022-06-10 DIAGNOSIS — C50411 Malignant neoplasm of upper-outer quadrant of right female breast: Secondary | ICD-10-CM

## 2022-06-21 ENCOUNTER — Ambulatory Visit: Payer: 59 | Attending: Hematology and Oncology | Admitting: Physical Therapy

## 2022-06-21 ENCOUNTER — Encounter: Payer: Self-pay | Admitting: Physical Therapy

## 2022-06-21 VITALS — BP 134/83 | HR 110

## 2022-06-21 DIAGNOSIS — R2681 Unsteadiness on feet: Secondary | ICD-10-CM

## 2022-06-21 NOTE — Therapy (Signed)
OUTPATIENT PHYSICAL THERAPY NEURO EVALUATION   Patient Name: LORRIEANN KINDIG MRN: [id number] DOB:12/30/1960, 62 y.o., female Today's Date: 06/21/2022   PCP: Bernadette Hoit, MD REFERRING PROVIDER: Almond Lint, MD  END OF SESSION:  PT End of Session - 06/21/22 1452     Visit Number 1    Number of Visits 1    Date for PT Re-Evaluation --    Authorization Type [employer]    PT Start Time 1450    PT Stop Time 1535    PT Time Calculation (min) 45 min    Equipment Utilized During Treatment Gait belt    Activity Tolerance Patient tolerated treatment well    Behavior During Therapy WFL for tasks assessed/performed             Past Medical History:  Diagnosis Date   BRCA2 gene mutation positive in female    Breast cancer ([facility]) 01/2020   right breast IDC   Bronchitis    Cervical spondylarthritis    Headache(784.0)    High cholesterol    Hypertension    Leukocytosis 2009   Multiple sclerosis ([facility]) 2009   followed by Dr. Sandria Manly   Neuromuscular disorder [facility])    Personal history of chemotherapy    Personal history of radiation therapy    Pneumonia    Rhinitis    Seasonal allergies    Sinusitis    Past Surgical History:  Procedure Laterality Date   ABDOMINAL HYSTERECTOMY  2006   uterus, cervix, portion of fallopian tubes for AUB   BREAST BIOPSY Left 10/10/2021   BREAST BIOPSY Right 02/02/2020   BREAST BIOPSY Right 01/28/2020   BREAST BIOPSY Right 12/31/2019   times 2   BREAST LUMPECTOMY Right 08/08/2020   BREAST LUMPECTOMY WITH RADIOACTIVE SEED AND SENTINEL LYMPH NODE BIOPSY Right 08/08/2020   Procedure: RIGHT BREAST LUMPECTOMY WITH RADIOACTIVE SEED X 3 AND SENTINEL LYMPH NODE BIOPSY;  Surgeon: Almond Lint, MD;  [facility];  Service: General;  Laterality: Right;   CESAREAN SECTION     x 3   CORRECTION HAMMER TOE Bilateral    PORT-A-CATH REMOVAL Right 09/21/2020   Procedure: REMOVAL PORT-A-CATH;  Surgeon: Almond Lint, MD;  [facility];   Service: General;  Laterality: Right;   PORTACATH PLACEMENT Right 02/22/2020   Procedure: RIGHT SUBCLAVIAN VEIN PORT PLACEMENT;  Surgeon: Manus Rudd, MD;  Location: [facility];  Service: General;  Laterality: Right;   ROBOTIC ASSISTED BILATERAL SALPINGO OOPHERECTOMY Bilateral 01/30/2021   Procedure: XI ROBOTIC ASSISTED BILATERAL SALPINGO OOPHORECTOMY, LYSIS OF ADHESIONS, 1 HOUR;  Surgeon: Carver Fila, MD;  [facility];  Service: Gynecology;  Laterality: Bilateral;   Patient Active Problem List   Diagnosis Date Noted   Bilateral ovarian cysts    Malignant neoplasm of upper-outer quadrant of right breast in female, estrogen receptor negative ([facility]) 01/10/2020   High risk medication use 03/25/2019   Microcytic anemia 12/31/2017   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 UACS 12/16/2017   History of pneumonia 11/12/2017   Morbid obesity ([facility]) 08/19/2016   Numbness 05/09/2015   Other fatigue 05/09/2015   Arthralgia of hip 11/16/2014   Low back pain with sciatica 11/16/2014   Urinary frequency 11/16/2014   Encounter for general adult medical examination without abnormal findings 06/07/2014   DOE (dyspnea on exertion) 04/27/2014   Atypical chest pain 04/27/2014   Dyslipidemia 04/27/2014   HLD (hyperlipidemia) 02/14/2014   Cervical spondylosis without myelopathy 07/28/2013   Cervical pain 07/28/2013   CFIDS (chronic  fatigue and immune dysfunction syndrome) ([facility]) 07/28/2013   Chronic infection of sinus 07/28/2013   Headache, migraine 07/28/2013   DS (disseminated sclerosis) ([facility]) 07/28/2013   Spasms of the hands or feet 07/28/2013   DDD (degenerative disc disease), cervical 07/28/2013   Menopausal symptom 07/28/2013   Taking drug for chronic disease 07/28/2013   Vitamin D deficiency 07/28/2013   Degeneration of intervertebral disc of cervical region 07/28/2013   Cough 04/06/2013   BRCA gene mutation positive in female 06/12/2011   Genetic susceptibility to malignant  neoplasm of breast 06/12/2011   Multiple sclerosis ([facility])    Leukocytosis     ONSET DATE: 06/10/2022 (referral date)  REFERRING DIAG: R29.6 (ICD-10-CM) - Repeated falls R26.89 (ICD-10-CM) - Other abnormalities of gait and mobility  THERAPY DIAG:  Unsteadiness on feet - Plan: PT plan of care cert/re-cert  Rationale for Evaluation and Treatment: Rehabilitation  SUBJECTIVE:                                                                                                                                                                                             SUBJECTIVE STATEMENT: Patient reports that she had two falls, one in November 29, she reports that she passed out and fell down on the stairs. Patient states she thinks she hit her head; she had CT scan that came back clear. Patient had another fall when she put her foot on the stairs and slid and fell due to slippery shoes and rain. Patient just finished up breast cancer for the R side with radiation in 2022 and just finished lynparza in January of 2024. Patient states that her doctor sent her to PT to screen for a concussion; patient denies sound sensitivity, light sensitivity, double vision and dizziness. Patient reports numbness and tingling related to MS, patient relapsing remitting. Patient has not had any major flair ups since 2009.   Pt accompanied by: self  PERTINENT HISTORY: RR MS diagnosed in 2009 when last major flair was, migraines, R breast cancer with R breast lumphectomy  PAIN:  Are you having pain? No  PRECAUTIONS: Fall and Other: L arm only to take BP  WEIGHT BEARING RESTRICTIONS: No  FALLS: Has patient fallen in last 6 months? Yes. Number of falls 2 falls as noted above, patient able to get up by herself  LIVING ENVIRONMENT: Lives with: lives with their family Lives in: House/apartment Stairs: Yes: Internal: 14 steps; can reach both and External: 3 steps; none Has following equipment at home: None  PLOF:  Independent - chemical buyer  PATIENT GOALS: "Rule out concussion and work on balance."   OBJECTIVE:  DIAGNOSTIC FINDINGS:   CT on 12/18//2023: IMPRESSION: This CT scan of the head without contrast shows the following: No acute findings. Couple small foci of hypoattenuation in the left cerebral hemisphere corresponding to foci of known T2 hyperintensity on brain MRI likely related to her MS. There did not appear to be any new lesions.  COGNITION: Overall cognitive status: Within functional limits for tasks assessed   SENSATION: Reports intermittent numbness on R side UE and LE related to MS  EDEMA:  Reports intermittent swelling on right side related to breast cancer treatment and some mild swelling in L knee due to injury on this side  POSTURE: rounded shoulders and forward head  LOWER EXTREMITY ROM:     Grossly WFL  LOWER EXTREMITY MMT:    Grossly WFL   TRANSFERS: Assistive device utilized: None  Sit to stand: Complete Independence Stand to sit: Complete Independence Chair to chair: Complete Independence  GAIT: Gait pattern: WFL Distance walked: Various clinic distances (~500 feet total) Assistive device utilized: None Level of assistance: Complete Independence Comments: No major signs of imbalance  FUNCTIONAL TESTS:    [facility] PT Assessment - 06/21/22 0001       Standardized Balance Assessment   Standardized Balance Assessment Mini-BESTest;10 meter walk test    10 Meter Walk 1.19 m/s   independent     Mini-BESTest   Sit To Stand Normal: Comes to stand without use of hands and stabilizes independently.    Rise to Toes Normal: Stable for 3 s with maximum height.    Stand on one leg (left) Normal: 20 s.    Stand on one leg (right) Normal: 20 s.    Stand on one leg - lowest score 2    Compensatory Stepping Correction - Forward Normal: Recovers independently with a single, large step (second realignement is allowed).    Compensatory Stepping Correction -  Backward Moderate: More than one step is required to recover equilibrium    Compensatory Stepping Correction - Left Lateral Moderate: Several steps to recover equilibrium    Compensatory Stepping Correction - Right Lateral Moderate: Several steps to recover equilibrium    Stepping Corredtion Lateral - lowest score 1    Stance - Feet together, eyes open, firm surface  Normal: 30s    Stance - Feet together, eyes closed, foam surface  Normal: 30s    Incline - Eyes Closed Normal: Stands independently 30s and aligns with gravity    Change in Gait Speed Normal: Significantly changes walkling speed without imbalance    Walk with head turns - Horizontal Normal: performs head turns with no change in gait speed and good balance    Walk with pivot turns Normal: Turns with feet close FAST (< 3 steps) with good balance.    Step over obstacles Normal: Able to step over box with minimal change of gait speed and with good balance.    Timed UP & GO with Dual Task Normal: No noticeable change in sitting, standing or walking while backward counting when compared to TUG without    Mini-BEST total score 26            PATIENT SURVEYS:   [place] Post Concussion Symptoms Questionnaire:  Compared to before the accident, do you now (last 24 hours) suffer from:  Headaches 2 = mild problem  Feeling of Dizziness 1 = no more of a problem  Nausea and/or vomiting 1 = no more of a problem  RPQ-3 (total of first 3 items)  Noise sensitivity (easily upset by loud noises) 0 = not experienced  Sleep disturbances 1 = no more of a problem  Fatigue, tiring more easily 2 = mild problem  Being irritable, easily angered: 0 = not experienced  Feeling depressed or tearful 0 = not experienced  Feeling frustrated or impatient 0 = not experienced  Forgetfulness, poor memory 0 = not experienced  Poor concentration 0 = not experienced  Taking longer to think 0 = not experienced  Blurred vision 2 = mild problem  Light  sensitivity (easily upset by bright light) 0 = not experienced  Double vision 0 = not experienced  Restlessness 0 = not experienced  RPQ-13 (total for next 13 items)    Vestibular/Ocular- Motor Screening (VOMS):   Vestibular/Ocular Motor Test:  Headache (0-10)  Dizziness (0-10) Nausea (0-10) Fogginess (0-10) Comments  BASELINE SYMPTOMS:  0 0 0 0   Smooth Pursuits 0 0 0 0   Saccades - Horizontal 0 0 0 0   Saccades - Vertical 0 0 0 0   Convergence ([place])  0 0 0 0  < 5 cm  VOR - Horizontal 0 0 0 0   VOR - Vertical 0 0 0 0   Visual Motion Sensitivity Test 0 0 0 0   *Cervical ROM tested before completing VOMS: WFL and painfree   Activities Specific Balance Scale: 99.7% Confidence  TODAY'S TREATMENT:                                                                                                                               Initial Eval only - treatment not indicated  PATIENT EDUCATION: Education details: Reason for not picking up for therapy and interpretation of today's session findings Person educated: Patient Education method: Explanation Education comprehension: verbalized understanding  HOME EXERCISE PROGRAM: Not indicated  GOALS: Not indicated  ASSESSMENT:  CLINICAL IMPRESSION: Patient is a 62 y.o. female who was seen today for physical therapy evaluation and treatment to screen for any remaining deficits following 2 falls within the last 6 months with PMH of breast cancer, MS, and chronic migraines. Patient two falls were due to patient passing out and one due to unexpectedly slipping due to a rainy step. Therapist performed extensive screening including VOMS and [place] to rule out post-concussive like symptoms. Therapist educated patient that symptoms of MS can sometimes also impact these results. Patient only had mild reports of symptoms on [place] and are likely primarily related to MS and history of chronic migraines when considered in regards to findings on  VOMS in which patient was asymptomatic. Patient scores on ABC > 67%, miniBEST > 22, and gait speed > 1.7m/s all place patient outside of falls risks. For these reasons, patient is not in need of skilled physical therapy services at this time as patient is not a falls risk and near baseline.   OBJECTIVE IMPAIRMENTS: Headaches, intermittent blurriness of vision briefly in morning, mild fatigue  ACTIVITY LIMITATIONS: None  PARTICIPATION LIMITATIONS: None  PERSONAL FACTORS: Past/current experiences and 3+ comorbidities: see above  are also affecting patient's functional outcome.   CLINICAL DECISION MAKING: Stable/uncomplicated  EVALUATION COMPLEXITY: Low  PLAN:  PT services not indicated at this time; eval only  Carmelia Bake, PT, DPT 06/21/2022, 4:58 PM

## 2022-07-26 DIAGNOSIS — R000 Tachycardia, unspecified: Secondary | ICD-10-CM | POA: Insufficient documentation

## 2022-07-26 DIAGNOSIS — E1165 Type 2 diabetes mellitus with hyperglycemia: Secondary | ICD-10-CM | POA: Insufficient documentation

## 2022-08-05 ENCOUNTER — Ambulatory Visit: Payer: 59 | Attending: Hematology and Oncology

## 2022-08-05 VITALS — Wt 233.1 lb

## 2022-08-05 DIAGNOSIS — Z483 Aftercare following surgery for neoplasm: Secondary | ICD-10-CM | POA: Insufficient documentation

## 2022-08-05 NOTE — Therapy (Signed)
OUTPATIENT PHYSICAL THERAPY SOZO SCREENING NOTE   Patient Name: Jacqueline Adams MRN: [id number] DOB:1960/04/13, 62 y.o., female Today's Date: 08/05/2022  PCP: Angelica Chessman, MD REFERRING PROVIDER: Serena Croissant, MD   PT End of Session - 08/05/22 [phone]     Visit Number 1   # unchanged due to screen only   PT Start Time 0832    PT Stop Time 0836    PT Time Calculation (min) 4 min    Activity Tolerance Patient tolerated treatment well    Behavior During Therapy [facility] for tasks assessed/performed             Past Medical History:  Diagnosis Date   BRCA2 gene mutation positive in female    Breast cancer ([facility]) 01/2020   right breast IDC   Bronchitis    Cervical spondylarthritis    Headache(784.0)    High cholesterol    Hypertension    Leukocytosis 2009   Multiple sclerosis ([facility]) 2009   followed by Dr. Sandria Manly   Neuromuscular disorder [facility])    Personal history of chemotherapy    Personal history of radiation therapy    Pneumonia    Rhinitis    Seasonal allergies    Sinusitis    Past Surgical History:  Procedure Laterality Date   ABDOMINAL HYSTERECTOMY  2006   uterus, cervix, portion of fallopian tubes for AUB   BREAST BIOPSY Left 10/10/2021   BREAST BIOPSY Right 02/02/2020   BREAST BIOPSY Right 01/28/2020   BREAST BIOPSY Right 12/31/2019   times 2   BREAST LUMPECTOMY Right 08/08/2020   BREAST LUMPECTOMY WITH RADIOACTIVE SEED AND SENTINEL LYMPH NODE BIOPSY Right 08/08/2020   Procedure: RIGHT BREAST LUMPECTOMY WITH RADIOACTIVE SEED X 3 AND SENTINEL LYMPH NODE BIOPSY;  Surgeon: Almond Lint, MD;  [facility];  Service: General;  Laterality: Right;   CESAREAN SECTION     x 3   CORRECTION HAMMER TOE Bilateral    PORT-A-CATH REMOVAL Right 09/21/2020   Procedure: REMOVAL PORT-A-CATH;  Surgeon: Almond Lint, MD;  [facility];  Service: General;  Laterality: Right;   PORTACATH PLACEMENT Right 02/22/2020   Procedure: RIGHT SUBCLAVIAN VEIN PORT PLACEMENT;   Surgeon: Manus Rudd, MD;  [facility];  Service: General;  Laterality: Right;   ROBOTIC ASSISTED BILATERAL SALPINGO OOPHERECTOMY Bilateral 01/30/2021   Procedure: XI ROBOTIC ASSISTED BILATERAL SALPINGO OOPHORECTOMY, LYSIS OF ADHESIONS, 1 HOUR;  Surgeon: Carver Fila, MD;  [facility];  Service: Gynecology;  Laterality: Bilateral;   Patient Active Problem List   Diagnosis Date Noted   Bilateral ovarian cysts    Malignant neoplasm of upper-outer quadrant of right breast in female, estrogen receptor negative ([facility]) 01/10/2020   High risk medication use 03/25/2019   Microcytic anemia 12/31/2017   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 UACS 12/16/2017   History of pneumonia 11/12/2017   Morbid obesity ([facility]) 08/19/2016   Numbness 05/09/2015   Other fatigue 05/09/2015   Arthralgia of hip 11/16/2014   Low back pain with sciatica 11/16/2014   Urinary frequency 11/16/2014   Encounter for general adult medical examination without abnormal findings 06/07/2014   DOE (dyspnea on exertion) 04/27/2014   Atypical chest pain 04/27/2014   Dyslipidemia 04/27/2014   HLD (hyperlipidemia) 02/14/2014   Cervical spondylosis without myelopathy 07/28/2013   Cervical pain 07/28/2013   CFIDS (chronic fatigue and immune dysfunction syndrome) ([facility]) 07/28/2013   Chronic infection of sinus 07/28/2013   Headache, migraine 07/28/2013   DS (disseminated sclerosis) ([facility]) 07/28/2013  Spasms of the hands or feet 07/28/2013   DDD (degenerative disc disease), cervical 07/28/2013   Menopausal symptom 07/28/2013   Taking drug for chronic disease 07/28/2013   Vitamin D deficiency 07/28/2013   Degeneration of intervertebral disc of cervical region 07/28/2013   Cough 04/06/2013   BRCA gene mutation positive in female 06/12/2011   Genetic susceptibility to malignant neoplasm of breast 06/12/2011   Multiple sclerosis ([facility])    Leukocytosis     REFERRING DIAG: right breast cancer at risk for  lymphedema  THERAPY DIAG: Aftercare following surgery for neoplasm  PERTINENT HISTORY: Patient was diagnosed on 12/23/2019 with 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 She underwent neoadjuvant chemotherapy 02/23/2020 - 07/05/2020 followed by a right lumpectomy and sentinel node biopsy (3 negative nodes) on 08/08/2020. Completed radiation.  She has relapsing remitting multiple sclerosis since 2009  PRECAUTIONS: right UE Lymphedema risk, None  SUBJECTIVE: Pt returns for her last 3 month L-Dex screen.   PAIN:  Are you having pain? No  SOZO SCREENING: Patient was assessed today using the SOZO machine to determine the lymphedema index score. This was compared to her baseline score. It was determined that she is within the recommended range when compared to her baseline and no further action is needed at this time. She will continue SOZO screenings. These are done every 3 months for 2 years post operatively followed by every 6 months for 2 years, and then annually.   L-DEX FLOWSHEETS - 08/05/22 0800       L-DEX LYMPHEDEMA SCREENING   Measurement Type Unilateral    L-DEX MEASUREMENT EXTREMITY Upper Extremity    POSITION  Standing    DOMINANT SIDE Right    At Risk Side Right    BASELINE SCORE (UNILATERAL) -0.3    L-DEX SCORE (UNILATERAL) 2.4    VALUE CHANGE (UNILAT) 2.7            P: Begin 6 month L-Dex screens next.   Hermenia Bers, PTA 08/05/2022, 8:35 AM

## 2022-09-04 NOTE — Progress Notes (Unsigned)
PATIENT: Jacqueline Adams DOB: 11-04-1960  REASON FOR VISIT: follow up HISTORY FROM: patient  No chief complaint on file.   HISTORY OF PRESENT ILLNESS:  09/04/22 ALL: Jacqueline Adams is a 62 y.o. female here today for follow up for RRMS. She continues Avonex. Labs were stable in 04/2022. Last MRI stable in 06/2021.   Overall she is doing well. Gait is stable without assistive device. She continues to have chronic fatigue and dysesthesias (mostly in hands and feet).headaches have worsened since starting oral chem med. She was previously on topiramate but unsure why this was discontinued. She continues vitamin D OTC.   She does sleep well. 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  She drinks plenty of water. She denies anxiety or depression. No changes in vision, bowel or bladder habits.    HISTORY: (copied from Dr Jacqueline Adams previous note)  Jacqueline Adams is a 62 y.o. woman with relapsing remitting MS diagnosed in 2009.     Update  02/20/2022: She is on Avonex and tolerates it well.    She is getting the drug through an assistance.  She tolerates it well.  No injection reactions.     She gets ho flashes sometimes after injections that are not troublesome.    She had 2 falls in December- one she turned around on steps to pick up something she dropped and she fell hitting the door frame with  her head.   No LOC but she felt funny a few minutes.    A second one 02/05/2022, she stepped with her right foot while wearing Crocs in wet weather and the foot slid out from under her.  She hurt her knee with  that one.   CT 01/28/2022 showed no acute change.   Gait does well on well on flat surfaces and could easily walk 2 miles.    She usually goes downstairs without the banister.   Right leg is a little weaker than left (normal). .  She has sciatica type pain shooting pain in her right leg at times but this is not worsening and not bad enough to treat.   Bladder  is doing well.   Vision is fine.     She denies much fatigue.   Her sleep pattern is better since she stopped chemo.    She is walking some everyday.   She denies depression.and tried to keep a positive attitude.  Cognition is doing well.     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    She was on carboplatin and paclitaxel.   She is on a pill now.   She gets shooting pain in her right hand/arm at times only.    She finished radiation 12/2020.        MS History:  In 2009, she had the onset of right arm and leg tingling and clumsiness. At the time, she was seeing Dr. Meryl Adams for migraines and he performed a nerve conduction study only showing mild carpal tunnel syndrome. Symptoms worsened the next day and she was referred to Dr. Sandria Adams. An MRI of the brain, cervical spine and a lumbar puncture was performed. The brain MRI showed a large left frontoparietal periventricular focus and several other foci. CSF was consistent with multiple sclerosis.  MRI of the cervical spine was normal. She was started on Avonex.   She was switched to Tecfidera in late 2014 as she had  one new focus on her 2014 MRI. She switched back to Avonex in 2016..   Imaging MRI of the brain 04/29/2018 showed T2/FLAIR hyperintense foci in the hemispheres in a pattern consistent with demyelination/MS.  None of the foci were acute and there were no new lesions compared to the MRI from 2018.   MRI of the brain 05/22/2016 showed T2/FLAIR foci in the hemispheres consistent with MS.  There were no acute findings.  No new lesions compared to 05/14/2015   MRI of the cervical spine 05/22/2016 showed a normal spinal cord and mild disc degenerative changes but no nerve root compression or spinal stenosis.   MRO of the brain 09/19/2020 showed Several T2/FLAIR hyperintense foci in the left hemisphere in a pattern consistent with chronic demyelinating plaque associated with multiple sclerosis.  None of the foci  enhance.  Compared to the MRI from 04/29/2018, there are no new lesions.   MRI cervical spine showed a normal spinal cord and DJD but no spinal stenosis or nerve root compression   REVIEW OF SYSTEMS: Out of a complete 14 system review of symptoms, the patient complains only of the following symptoms, chronic fatigue, dysesthesias, headaches and all other reviewed systems are negative.   ALLERGIES: Allergies  Allergen Reactions   Nabumetone Swelling and Hives   Procaine Swelling   Shellfish Allergy Anaphylaxis    HOME MEDICATIONS: Outpatient Medications Prior to Visit  Medication Sig Dispense Refill   Calcium Carb-Cholecalciferol (CALCIUM 600+D3 PO) Take 1 tablet by mouth in the morning and at bedtime.     ELDERBERRY PO Take by mouth. Patient report taking 2 gummies daily     fexofenadine (ALLEGRA) 180 MG tablet Take 180 mg by mouth daily as needed for allergies.     fluticasone (FLONASE) 50 MCG/ACT nasal spray Place 2 sprays into both nostrils daily. 16 g 2   ibuprofen (ADVIL) 200 MG tablet Take 400 mg by mouth every Friday. W/Avonex injection     interferon beta-1a (AVONEX PREFILLED) 30 MCG/0.5ML PSKT injection INJECT ONE SYRINGE INTRAMUSCULARLY ONCE WEEKLY. REFRIGERATE. PROTECT FROM LIGHT. ALLOW TO WARM TO ROOM TEMPERATURE PRIOR TO USE. 3 each 3   magnesium oxide (MAG-OX) 400 (240 Mg) MG tablet Take 400 mg by mouth daily. Use for headaches due to olaparib per neurology OTC     metoprolol succinate (TOPROL-XL) 25 MG 24 hr tablet Take 25 mg by mouth daily.     Multiple Vitamin (MULTIVITAMIN WITH MINERALS) TABS tablet Take 1 tablet by mouth every evening. Centrum     potassium chloride SA (KLOR-CON M20) 20 MEQ tablet TAKE 1 TABLET BY MOUTH EVERY DAY IN THE MORNING 90 tablet 3   simvastatin (ZOCOR) 20 MG tablet Take 20 mg by mouth every evening.     VENTOLIN HFA 108 (90 BASE) MCG/ACT inhaler Inhale 1-2 puffs into the lungs every 6 (six) hours as needed for shortness of breath or wheezing.      No facility-administered medications prior to visit.    PAST MEDICAL HISTORY: Past Medical History:  Diagnosis Date   BRCA2 gene mutation positive in female    Breast cancer (HCC) 01/2020   right breast IDC   Bronchitis    Cervical spondylarthritis    Headache(784.0)    High cholesterol    Hypertension    Leukocytosis 2009   Multiple sclerosis (HCC) 2009   followed by Dr. Sandria Adams   Neuromuscular disorder [facility])    Personal history of chemotherapy    Personal history of radiation therapy  Pneumonia    Rhinitis    Seasonal allergies    Sinusitis     PAST SURGICAL HISTORY: Past Surgical History:  Procedure Laterality Date   ABDOMINAL HYSTERECTOMY  2006   uterus, cervix, portion of fallopian tubes for AUB   BREAST BIOPSY Left 10/10/2021   BREAST BIOPSY Right 02/02/2020   BREAST BIOPSY Right 01/28/2020   BREAST BIOPSY Right 12/31/2019   times 2   BREAST LUMPECTOMY Right 08/08/2020   BREAST LUMPECTOMY WITH RADIOACTIVE SEED AND SENTINEL LYMPH NODE BIOPSY Right 08/08/2020   Procedure: RIGHT BREAST LUMPECTOMY WITH RADIOACTIVE SEED X 3 AND SENTINEL LYMPH NODE BIOPSY;  Surgeon: Almond Lint, MD;  Location: [facility] OR;  Service: General;  Laterality: Right;   CESAREAN SECTION     x 3   CORRECTION HAMMER TOE Bilateral    PORT-A-CATH REMOVAL Right 09/21/2020   Procedure: REMOVAL PORT-A-CATH;  Surgeon: Almond Lint, MD;  Location: [facility] OR;  Service: General;  Laterality: Right;   PORTACATH PLACEMENT Right 02/22/2020   Procedure: RIGHT SUBCLAVIAN VEIN PORT PLACEMENT;  Surgeon: Manus Rudd, MD;  Location: [facility];  Service: General;  Laterality: Right;   ROBOTIC ASSISTED BILATERAL SALPINGO OOPHERECTOMY Bilateral 01/30/2021   Procedure: XI ROBOTIC ASSISTED BILATERAL SALPINGO OOPHORECTOMY, LYSIS OF ADHESIONS, 1 HOUR;  Surgeon: Carver Fila, MD;  Location: [facility];  Service: Gynecology;  Laterality: Bilateral;    FAMILY HISTORY: Family History  Problem  Relation Age of Onset   Cancer Mother 57       ovarian cancer; now with suspected breast cancer   Diabetes Mother        also HTN   Breast cancer Mother    Clotting disorder Father    Prostate cancer Father    Colon cancer Sister 23       anal cancer (2014)   Lung cancer Sister 63   Stroke Sister 73   Ovarian cancer Sister 1   BRCA 1/2 Sister    Breast cancer Maternal Aunt    Breast cancer Maternal Aunt    Breast cancer Maternal Aunt    Cancer Maternal Grandmother        unknown female reproductive organ cancer   Endometrial cancer Niece    Pancreatic cancer Neg Hx     SOCIAL HISTORY: Social History   Socioeconomic History   Marital status: Married    Spouse name: John   Number of children: 2   Years of education: Nature conservation officer education level: Not on file  Occupational History   Occupation: Management consultant: [employer]  Tobacco Use   Smoking status: Never   Smokeless tobacco: Never  Vaping Use   Vaping status: Never Used  Substance and Sexual Activity   Alcohol use: No   Drug use: No   Sexual activity: Yes    Birth control/protection: Surgical  Other Topics Concern   Not on file  Social History Narrative   Patient lives at home with her spouse.   Caffeine Use: 2 sodas daily   Social Determinants of Health   Financial Resource Strain: Low Risk  (04/25/2020)   Received from [facility] visits prior to 04/13/2022.   Overall Financial Resource Strain (CARDIA)    Difficulty of Paying Living Expenses: Not hard at all  Food Insecurity: Low Risk  (07/25/2022)   [facility]   Food vital sign    Within the past 12 months, you worried that your food  would run out before you got money to buy more: Never true    Within the past 12 months, the food you bought just didn't last and you didn't have money to get more. : Never true  Transportation Needs: No Transportation Needs (07/25/2022)   [facility]   Transportation    In the past 12 months, has lack of reliable transportation kept you from medical appointments, meetings, work or from getting things needed for daily living? : No  Physical Activity: Sufficiently Active (04/25/2020)   [facility] prior to 04/13/2022.   Exercise Vital Sign    Days of Exercise per Week: 5 days    Minutes of Exercise per Session: 30 min  Stress: No Stress Concern Present (04/25/2020)   Received from [facility] visits prior to 04/13/2022.   [employer] of Occupational Health - Occupational Stress Questionnaire    Feeling of Stress : Not at all  Social Connections: Unknown (04/25/2020)   [facility] visits prior to 04/13/2022.   Social Connection and Isolation Panel [NHANES]    Frequency of Communication with Friends and Family: More than three times a week    Frequency of Social Gatherings with Friends and Family: Not on file    Attends Religious Services: Not on file    Active Member of Clubs or Organizations: Yes    Attends Banker Meetings: Not on file    Marital Status: Married  Intimate Partner Violence: Not At Risk (04/25/2020)   [facility] visits prior to 04/13/2022.   Humiliation, Afraid, Rape, and Kick questionnaire    Fear of Current or Ex-Partner: No    Emotionally Abused: No    Physically Abused: No    Sexually Abused: No      PHYSICAL EXAM  There were no vitals filed for this visit.   There is no height or weight on file to calculate BMI.  Generalized: Well developed, in no acute distress  Cardiology: normal rate and rhythm, no murmur noted Respiratory: clear to auscultation bilaterally  Neurological examination  Mentation: Alert oriented to time, place, history taking. Follows all commands speech and language fluent Cranial nerve II-XII: Pupils were equal round  reactive to light. Extraocular movements were full, visual field were full on confrontational test. Facial sensation and strength were normal. Uvula tongue midline. Head turning and shoulder shrug  were normal and symmetric. Motor: The motor testing reveals 5 over 5 strength of all 4 extremities. Good symmetric motor tone is noted throughout.  Sensory: Sensory testing is intact to soft touch on all 4 extremities. No evidence of extinction is noted.  Coordination: Cerebellar testing reveals good finger-nose-finger and heel-to-shin bilaterally.  Gait and station: Gait is normal.   Reflexes: Deep tendon reflexes are symmetric and normal bilaterally.   DIAGNOSTIC DATA (LABS, IMAGING, TESTING) - I reviewed patient records, labs, notes, testing and imaging myself where available.      No data to display           Lab Results  Component Value Date   WBC 11.8 (H) 03/05/2022   HGB 12.7 03/05/2022   HCT 37.7 03/05/2022   MCV 84.2 03/05/2022   PLT 262 03/05/2022      Component Value Date/Time   NA 142 03/05/2022 1029   NA 142 05/05/2020 1625   NA 143 11/22/2013 1514   K 3.9 03/05/2022  1029   K 3.1 (L) 11/22/2013 1514   CL 105 03/05/2022 1029   CL 105 02/18/2012 1207   CO2 31 03/05/2022 1029   CO2 25 11/22/2013 1514   GLUCOSE 88 03/05/2022 1029   GLUCOSE 105 11/22/2013 1514   GLUCOSE 96 02/18/2012 1207   BUN 7 03/05/2022 1029   BUN 8 05/05/2020 1625   BUN 6.6 (L) 11/22/2013 1514   CREATININE 0.77 03/05/2022 1029   CREATININE 0.8 11/22/2013 1514   CALCIUM 9.5 03/05/2022 1029   CALCIUM 9.0 11/22/2013 1514   PROT 7.1 03/05/2022 1029   PROT 7.0 09/22/2019 0840   PROT 7.4 11/22/2013 1514   ALBUMIN 3.9 03/05/2022 1029   ALBUMIN 4.0 09/22/2019 0840   ALBUMIN 3.3 (L) 11/22/2013 1514   AST 22 03/05/2022 1029   AST 15 11/22/2013 1514   ALT 22 03/05/2022 1029   ALT 13 11/22/2013 1514   ALKPHOS 87 03/05/2022 1029   ALKPHOS 91 11/22/2013 1514   BILITOT 0.4 03/05/2022 1029   BILITOT  0.23 11/22/2013 1514   GFRNONAA >60 03/05/2022 1029   GFRAA 113 09/22/2019 0840   Lab Results  Component Value Date   CHOL 194 03/25/2019   HDL 43 03/25/2019   LDLCALC 133 (H) 03/25/2019   TRIG 99 03/25/2019   CHOLHDL 4.5 (H) 03/25/2019   No results found for: "HGBA1C" No results found for: "VITAMINB12" Lab Results  Component Value Date   TSH 0.873 03/08/2020       ASSESSMENT AND PLAN 62 y.o. 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 here with   No diagnosis found.    Jacqueline Adams is doing well. Symptoms are stable. No new weakness, numbness, vision or gait changes. No changes in bowel or bladder habits. We will continue Avonex injections as prescribed. Labs from oncology reviewed in Epic are stable. She will continue vitamin D supplement OTC. We have discussed worsening headaches since starting oral chemo treatment. We discussed resuming topiramate versus trial of magnesium/riboflavin. She will discuss with oncology and let me know if she wishes to restart topiramate. She will continue focusing on healthy lifestyle habits. She will follow up in 6 months, sooner if needed. She verbalizes understanding and agreement with this plan.    No orders of the defined types were placed in this encounter.    No orders of the defined types were placed in this encounter.     Shawnie Dapper, FNP-C 09/04/2022, 11:05 AM [facility] [address], [address] [place], [place] [address] [phone]

## 2022-09-04 NOTE — Patient Instructions (Signed)

## 2022-09-05 ENCOUNTER — Ambulatory Visit: Payer: 59 | Admitting: Family Medicine

## 2022-09-05 ENCOUNTER — Encounter: Payer: Self-pay | Admitting: Family Medicine

## 2022-09-05 VITALS — BP 119/78 | HR 76 | Ht 64.0 in | Wt 229.0 lb

## 2022-09-05 DIAGNOSIS — E559 Vitamin D deficiency, unspecified: Secondary | ICD-10-CM

## 2022-09-05 DIAGNOSIS — R208 Other disturbances of skin sensation: Secondary | ICD-10-CM

## 2022-09-05 DIAGNOSIS — G35 Multiple sclerosis: Secondary | ICD-10-CM

## 2022-09-05 DIAGNOSIS — G43009 Migraine without aura, not intractable, without status migrainosus: Secondary | ICD-10-CM

## 2022-09-05 DIAGNOSIS — R5383 Other fatigue: Secondary | ICD-10-CM

## 2022-09-05 DIAGNOSIS — Z79899 Other long term (current) drug therapy: Secondary | ICD-10-CM | POA: Diagnosis not present

## 2022-09-12 ENCOUNTER — Ambulatory Visit (HOSPITAL_COMMUNITY)
Admission: RE | Admit: 2022-09-12 | Discharge: 2022-09-12 | Disposition: A | Discharge status: Home / Self Care | Payer: 59 | Source: Ambulatory Visit | Attending: General Surgery | Admitting: General Surgery

## 2022-09-12 DIAGNOSIS — Z1501 Genetic susceptibility to malignant neoplasm of breast: Secondary | ICD-10-CM | POA: Insufficient documentation

## 2022-09-12 DIAGNOSIS — Z171 Estrogen receptor negative status [ER-]: Secondary | ICD-10-CM | POA: Insufficient documentation

## 2022-09-12 DIAGNOSIS — Z1509 Genetic susceptibility to other malignant neoplasm: Secondary | ICD-10-CM | POA: Insufficient documentation

## 2022-09-12 DIAGNOSIS — Z1502 Genetic susceptibility to malignant neoplasm of ovary: Secondary | ICD-10-CM | POA: Diagnosis present

## 2022-09-12 DIAGNOSIS — C50411 Malignant neoplasm of upper-outer quadrant of right female breast: Secondary | ICD-10-CM | POA: Insufficient documentation

## 2022-09-12 MED ORDER — GADOBUTROL 1 MMOL/ML IV SOLN
10.0000 mL | Freq: Once | INTRAVENOUS | Status: AC | PRN
  Administered 2022-09-12: 10 mL via INTRAVENOUS

## 2022-09-16 ENCOUNTER — Other Ambulatory Visit: Payer: Self-pay | Admitting: General Surgery

## 2022-09-16 DIAGNOSIS — R928 Other abnormal and inconclusive findings on diagnostic imaging of breast: Secondary | ICD-10-CM

## 2022-09-18 ENCOUNTER — Inpatient Hospital Stay
Admission: RE | Admit: 2022-09-18 | Discharge: 2022-09-18 | Disposition: A | Discharge status: Home / Self Care | Payer: 59 | Source: Ambulatory Visit | Attending: General Surgery | Admitting: General Surgery

## 2022-09-19 ENCOUNTER — Ambulatory Visit
Admission: RE | Admit: 2022-09-19 | Discharge: 2022-09-19 | Disposition: A | Discharge status: Home / Self Care | Payer: 59 | Source: Ambulatory Visit | Attending: General Surgery | Admitting: General Surgery

## 2022-09-19 DIAGNOSIS — R928 Other abnormal and inconclusive findings on diagnostic imaging of breast: Secondary | ICD-10-CM

## 2022-09-19 MED ORDER — GADOBUTROL 1 MMOL/ML IV SOLN
10.0000 mL | Freq: Once | INTRAVENOUS | Status: AC | PRN
  Administered 2022-09-19: 10 mL via INTRAVENOUS

## 2022-11-27 IMAGING — MR MR BREAST BILAT WO/W CM
7 of 12 series · 28 of 48 positions shown · IV contrast (gadavist)
Comparison: Previous exam(s).

CLINICAL DATA: 58-year-old female who recently had 2
ultrasound-guided core biopsies of the right breast showing grade ll
invasive ductal carcinoma and ductal carcinoma in situ of the right
breast at [DATE] 2 cm from the nipple and grade lll invasive ductal
carcinoma of the right breast at [DATE] 7 cm from the nipple.

LABS:  None obtained at the time of imaging.
EXAM:
BILATERAL BREAST MRI WITH AND WITHOUT CONTRAST
TECHNIQUE: Multiplanar, multisequence MR images of both breasts were obtained
prior to and following the intravenous administration of 10 ml of
Gadavist

[Series 2: t2_tirm_tra ipat (a-p) · axial · 3.0mm · 0.70mm/px · 1 of 65 slices shown]
[im 1/65]
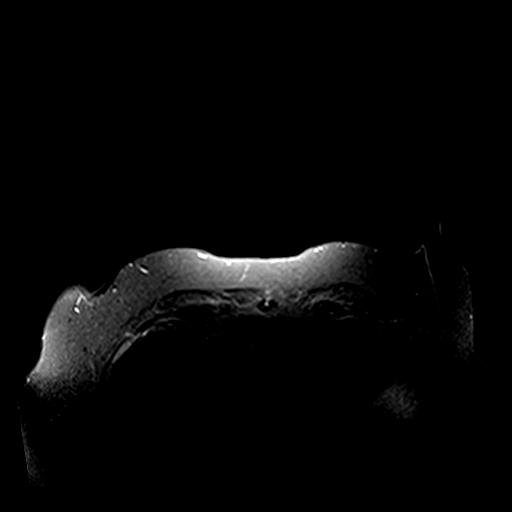

[Series 3: fl3d pre-cm no · axial · non-contrast · 0.9mm · 0.94mm/px · z∈[-82,+104]mm · 5 of 208 slices shown]
[im 1/208]
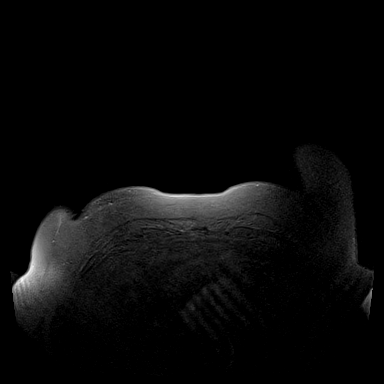
[im 52/208]
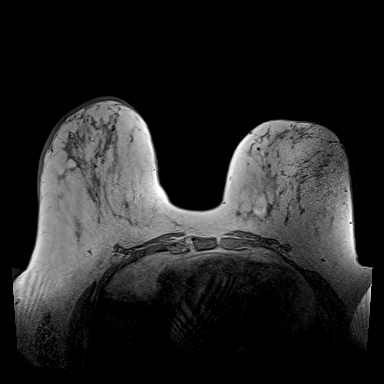
[im 104/208]
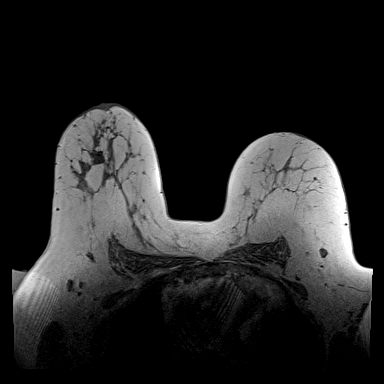
[im 156/208]
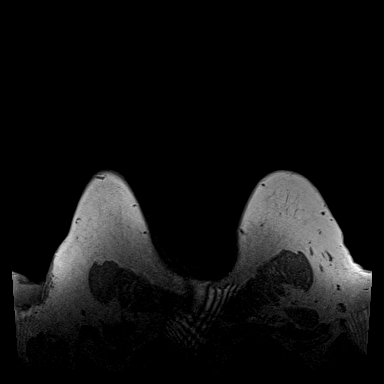
[im 208/208]
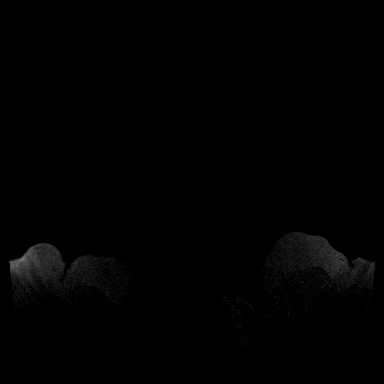

[Series 4: fl3d pre-cm · axial · non-contrast · 0.9mm · 0.87mm/px · z∈[-82,+104]mm · 5 of 208 slices shown]
[im 1/208]
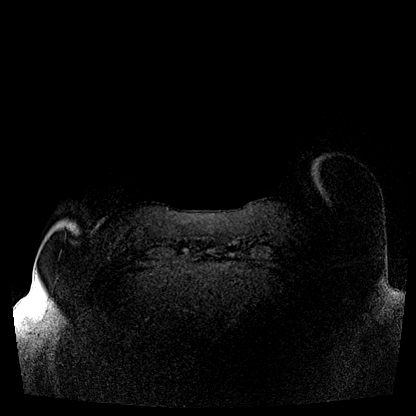
[im 52/208]
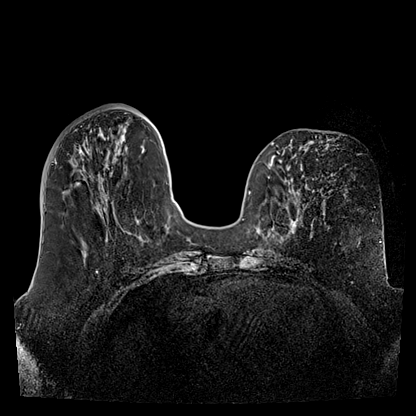
[im 104/208]
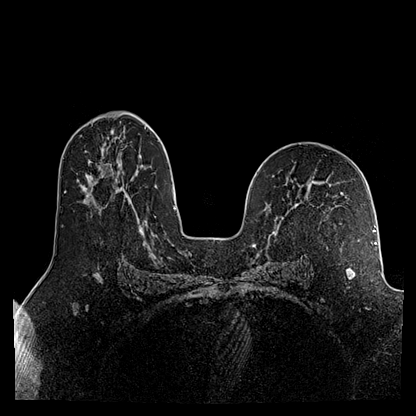
[im 156/208]
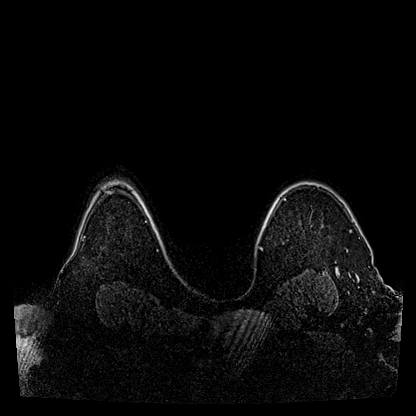
[im 208/208]
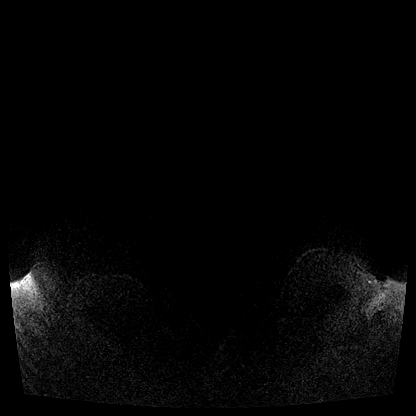

[Series 5: fl3d post-cm 20 · axial · 0.9mm · 0.87mm/px · z∈[-82,+104]mm · 5 of 208 slices shown (1 of 3)]
[im 1/208]
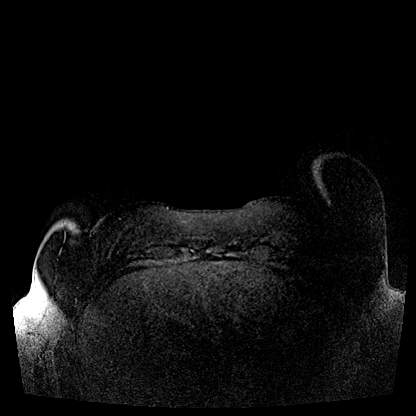
[im 52/208]
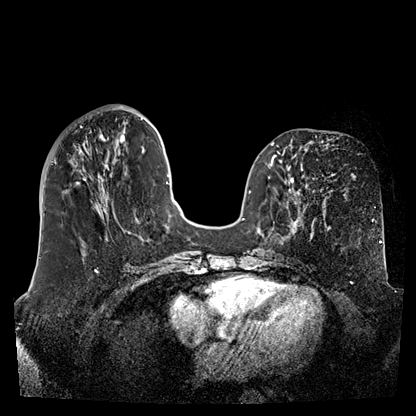
[im 104/208]
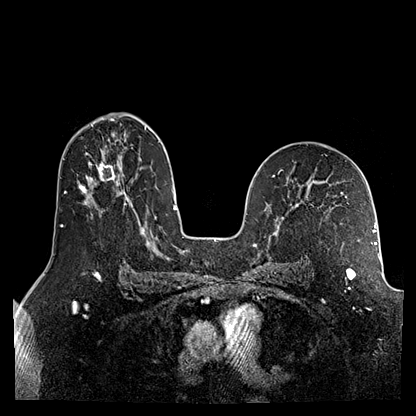
[im 156/208]
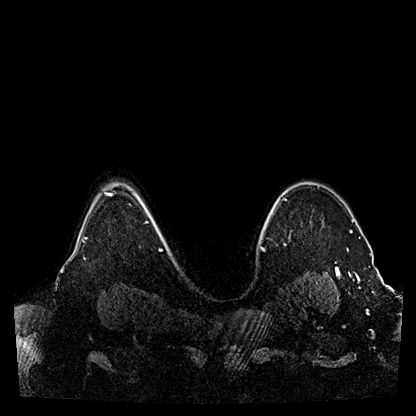
[im 208/208]
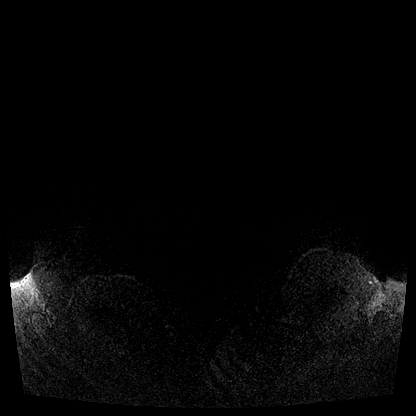

[Series 6: fl3d post-cm 20 · axial · 0.9mm · 0.87mm/px · z∈[-82,+104]mm · 5 of 208 slices shown (2 of 3)]
[im 1/208]
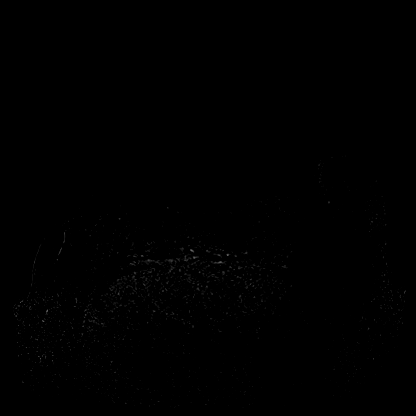
[im 52/208]
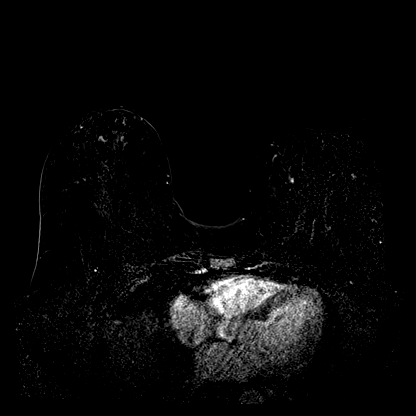
[im 104/208]
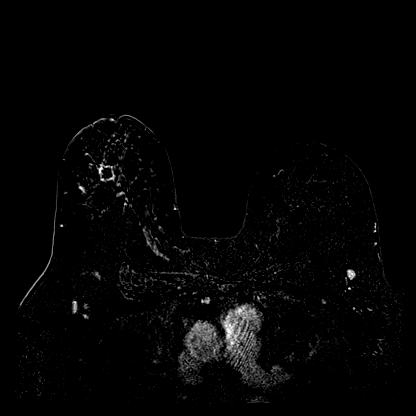
[im 156/208]
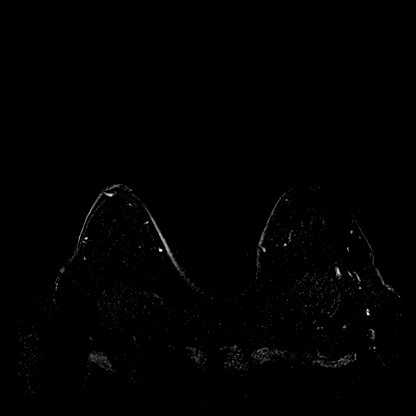
[im 208/208]
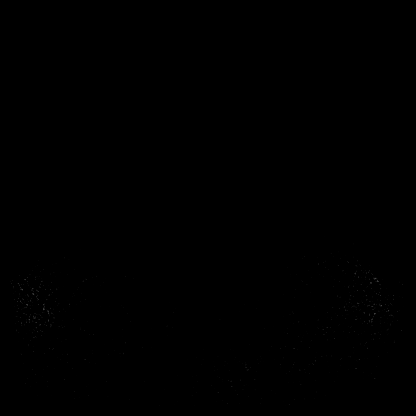

[Series 7: fl3d post-cm 20 · axial · 187.2mm · 0.87mm/px · 1 of 1 slices shown (3 of 3)]
[im 1/1]
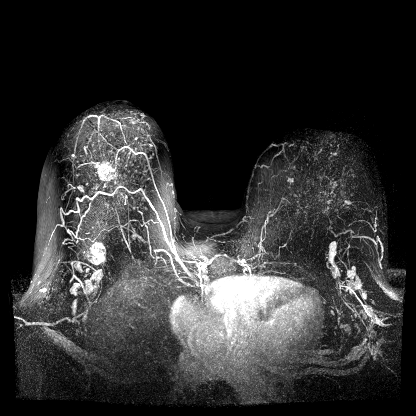

[Series 8: fl3d post-cm 3min · axial · 0.9mm · 0.87mm/px · z∈[-82,+104]mm · 6 of 208 slices shown]
[im 1/208]
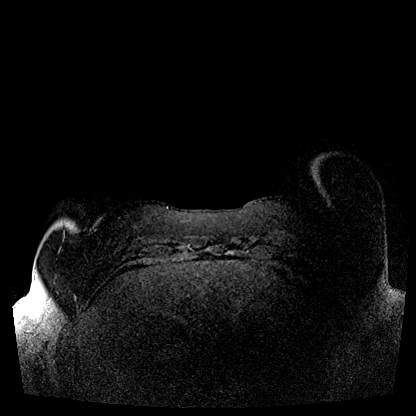
[im 42/208]
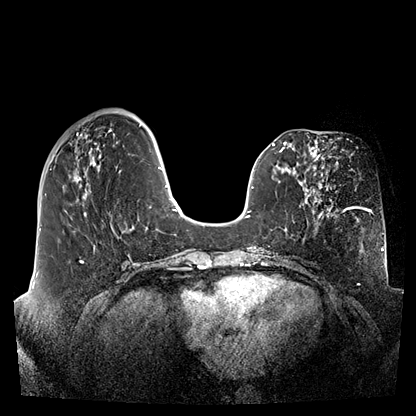
[im 83/208]
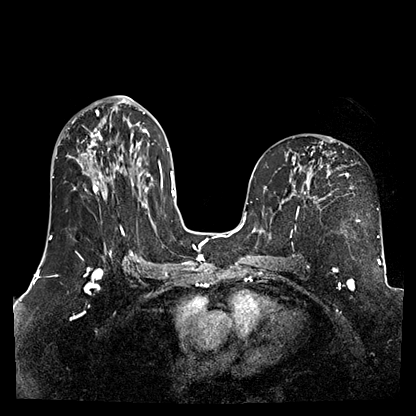
[im 125/208]
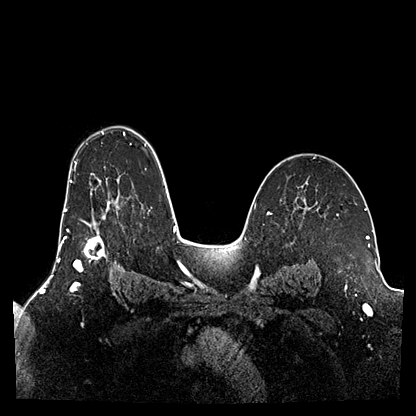
[im 166/208]
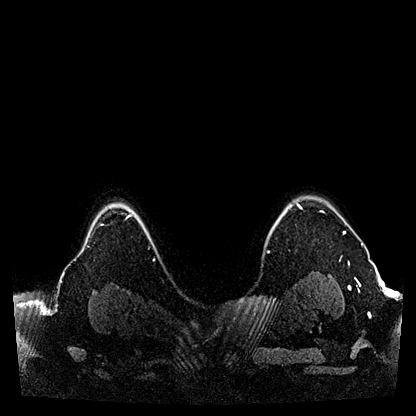
[im 208/208]
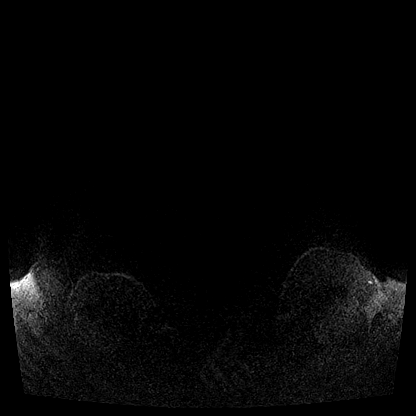

[28 of 48 positions shown; findings below may reference images not displayed]

Three-dimensional MR images were rendered by post-processing of the
original MR data on an independent workstation. The
three-dimensional MR images were interpreted, and findings are
reported in the following complete MRI report for this study. Three
dimensional images were evaluated at the independent interpreting
workstation using the DynaCAD thin client.
FINDINGS: Breast composition: c. Heterogeneous fibroglandular tissue.

Background parenchymal enhancement: Moderate.

Right breast:

1. There is a ring-enhancing irregular spiculated mass in the
anterior third of the upper-outer quadrant with signal void artifact
from the biopsy marker clip measuring 2.0 x 2.0 x 1.9 cm (series 6
image 102/208).

2. There is an irregular ring-enhancing mass in the posterior third
of the upper-outer quadrant of the right breast with a signal void
artifact from the biopsy marker clip measuring 2.1 x 1.9 x 1.8 cm
(series 6, image 85/208).

3. There is a ring-enhancing mass in the upper-outer quadrant
measuring 6 x 4 x 7 mm (series 6, image 102/208). It is located
cm lateral and posterior to the anterior biopsied mass.

Left breast: No mass or abnormal enhancement.

Lymph nodes: No abnormal appearing lymph nodes.

Ancillary findings:  None.
IMPRESSION: 2.0 cm spiculated mass in the anterior third of the upper outer
quadrant of the right breast and 2.1 cm spiculated mass in the
posterior third of the upper-outer quadrant of the right breast
corresponding with the biopsy proven malignancies. There is a 7 mm
suspicious enhancing mass 1.8 cm lateral and posterior to the
biopsied anterior mass.

RECOMMENDATION:
Treatment planning of the biopsy proven malignancies in the right
breast is recommended. If the patient is considering conservative
treatment additional biopsy would be recommended. I would recommend
magnification views and stereotactic biopsy of the calcifications in
between the two masses. If the calcifications are not malignant MR
guided core biopsy of the third mass would be recommended.

BI-RADS CATEGORY  6: Known biopsy-proven malignancy.

## 2022-11-29 NOTE — Telephone Encounter (Signed)
TC

## 2023-01-02 ENCOUNTER — Telehealth: Payer: Self-pay

## 2023-01-02 NOTE — Telephone Encounter (Signed)
Pharmacy Patient Advocate Encounter   Received notification from Fax that prior authorization for Avonex is required/requested.    Insurance verification completed.   The patient is insured through [facility] .   Per test claim: PA required; PA submitted to above mentioned insurance via Fax Key/confirmation #/EOC N/A Status is pending  [id number]  Faxed completed PA form along with clinicals to [employer] [employer] at [phone]

## 2023-01-13 ENCOUNTER — Ambulatory Visit: Payer: 59 | Attending: Hematology and Oncology | Admitting: Physical Therapy

## 2023-01-13 ENCOUNTER — Encounter: Payer: Self-pay | Admitting: Physical Therapy

## 2023-01-13 DIAGNOSIS — C50411 Malignant neoplasm of upper-outer quadrant of right female breast: Secondary | ICD-10-CM | POA: Insufficient documentation

## 2023-01-13 DIAGNOSIS — Z171 Estrogen receptor negative status [ER-]: Secondary | ICD-10-CM | POA: Insufficient documentation

## 2023-01-13 NOTE — Therapy (Signed)
OUTPATIENT PHYSICAL THERAPY SOZO SCREENING NOTE   Patient Name: Jacqueline Adams MRN: [id number] DOB:1960-03-04, 62 y.o., female Today's Date: 01/13/2023  PCP: Bernadette Hoit, MD REFERRING PROVIDER: Serena Croissant, MD   PT End of Session - 01/13/23 0941     Visit Number 1   unchanged screen only   PT Start Time 0941    PT Stop Time 0945    PT Time Calculation (min) 4 min    Activity Tolerance Patient tolerated treatment well    Behavior During Therapy [facility] for tasks assessed/performed             Past Medical History:  Diagnosis Date   BRCA2 gene mutation positive in female    Breast cancer (HCC) 01/2020   right breast IDC   Bronchitis    Cervical spondylarthritis    Headache(784.0)    High cholesterol    Hypertension    Leukocytosis 2009   Multiple sclerosis (HCC) 2009   followed by Dr. Sandria Manly   Neuromuscular disorder [facility])    Personal history of chemotherapy    Personal history of radiation therapy    Pneumonia    Rhinitis    Seasonal allergies    Sinusitis    Past Surgical History:  Procedure Laterality Date   ABDOMINAL HYSTERECTOMY  2006   uterus, cervix, portion of fallopian tubes for AUB   BREAST BIOPSY Left 10/10/2021   BREAST BIOPSY Right 02/02/2020   BREAST BIOPSY Right 01/28/2020   BREAST BIOPSY Right 12/31/2019   times 2   BREAST LUMPECTOMY Right 08/08/2020   BREAST LUMPECTOMY WITH RADIOACTIVE SEED AND SENTINEL LYMPH NODE BIOPSY Right 08/08/2020   Procedure: RIGHT BREAST LUMPECTOMY WITH RADIOACTIVE SEED X 3 AND SENTINEL LYMPH NODE BIOPSY;  Surgeon: Almond Lint, MD;  Location: [facility] OR;  Service: General;  Laterality: Right;   CESAREAN SECTION     x 3   CORRECTION HAMMER TOE Bilateral    PORT-A-CATH REMOVAL Right 09/21/2020   Procedure: REMOVAL PORT-A-CATH;  Surgeon: Almond Lint, MD;  Location: [facility] OR;  Service: General;  Laterality: Right;   PORTACATH PLACEMENT Right 02/22/2020   Procedure: RIGHT SUBCLAVIAN VEIN PORT PLACEMENT;  Surgeon:  Manus Rudd, MD;  Location: [facility];  Service: General;  Laterality: Right;   ROBOTIC ASSISTED BILATERAL SALPINGO OOPHERECTOMY Bilateral 01/30/2021   Procedure: XI ROBOTIC ASSISTED BILATERAL SALPINGO OOPHORECTOMY, LYSIS OF ADHESIONS, 1 HOUR;  Surgeon: Carver Fila, MD;  Location: [facility];  Service: Gynecology;  Laterality: Bilateral;   Patient Active Problem List   Diagnosis Date Noted   Bilateral ovarian cysts    Malignant neoplasm of upper-outer quadrant of right breast in female, estrogen receptor negative (HCC) 01/10/2020   High risk medication use 03/25/2019   Microcytic anemia 12/31/2017   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 UACS 12/16/2017   History of pneumonia 11/12/2017   Morbid obesity (HCC) 08/19/2016   Numbness 05/09/2015   Other fatigue 05/09/2015   Arthralgia of hip 11/16/2014   Low back pain with sciatica 11/16/2014   Urinary frequency 11/16/2014   Encounter for general adult medical examination without abnormal findings 06/07/2014   DOE (dyspnea on exertion) 04/27/2014   Atypical chest pain 04/27/2014   Dyslipidemia 04/27/2014   HLD (hyperlipidemia) 02/14/2014   Cervical spondylosis without myelopathy 07/28/2013   Cervical pain 07/28/2013   CFIDS (chronic fatigue and immune dysfunction syndrome) (HCC) 07/28/2013   Chronic infection of sinus 07/28/2013   Headache, migraine 07/28/2013   DS (disseminated sclerosis) (HCC) 07/28/2013   Spasms of  the hands or feet 07/28/2013   DDD (degenerative disc disease), cervical 07/28/2013   Menopausal symptom 07/28/2013   Taking drug for chronic disease 07/28/2013   Vitamin D deficiency 07/28/2013   Degeneration of intervertebral disc of cervical region 07/28/2013   Cough 04/06/2013   BRCA gene mutation positive in female 06/12/2011   Genetic susceptibility to malignant neoplasm of breast 06/12/2011   Multiple sclerosis (HCC)    Leukocytosis     REFERRING DIAG: right breast cancer at risk for  lymphedema  THERAPY DIAG: Malignant neoplasm of upper-outer quadrant of right breast in female, estrogen receptor negative (HCC)  PERTINENT HISTORY: Patient was diagnosed on 12/23/2019 with 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 She underwent neoadjuvant chemotherapy 02/23/2020 - 07/05/2020 followed by a right lumpectomy and sentinel node biopsy (3 negative nodes) on 08/08/2020. Completed radiation.  She has relapsing remitting multiple sclerosis since 2009  PRECAUTIONS: right UE Lymphedema risk, None  SUBJECTIVE: Pt returns for her last 3 month L-Dex screen.   PAIN:  Are you having pain? No  SOZO SCREENING: Patient was assessed today using the SOZO machine to determine the lymphedema index score. This was compared to her baseline score. It was determined that she is within the recommended range when compared to her baseline and no further action is needed at this time. She will continue SOZO screenings. These are done every 3 months for 2 years post operatively followed by every 6 months for 2 years, and then annually.   L-DEX FLOWSHEETS - 01/13/23 0900       L-DEX LYMPHEDEMA SCREENING   Measurement Type Unilateral    L-DEX MEASUREMENT EXTREMITY Upper Extremity    POSITION  Standing    DOMINANT SIDE Right    At Risk Side Right    BASELINE SCORE (UNILATERAL) -0.3    L-DEX SCORE (UNILATERAL) -0.3    VALUE CHANGE (UNILAT) 0             P: 6 month L-Dex screens next.   [facility] [place], PT 01/13/2023, 9:45 AM

## 2023-01-24 NOTE — Telephone Encounter (Signed)
Received a fax requesting additional information-faxed completed forms and clinicals to [phone].

## 2023-02-11 ENCOUNTER — Other Ambulatory Visit: Payer: Self-pay

## 2023-02-11 ENCOUNTER — Other Ambulatory Visit: Payer: Self-pay | Admitting: Neurology

## 2023-02-11 DIAGNOSIS — G35 Multiple sclerosis: Secondary | ICD-10-CM

## 2023-02-11 MED ORDER — AVONEX PREFILLED 30 MCG/0.5ML IM PSKT: PREFILLED_SYRINGE | INTRAMUSCULAR | 3 refills | Status: DC

## 2023-02-18 ENCOUNTER — Other Ambulatory Visit: Payer: Self-pay | Admitting: Hematology and Oncology

## 2023-02-18 DIAGNOSIS — Z853 Personal history of malignant neoplasm of breast: Secondary | ICD-10-CM

## 2023-03-06 ENCOUNTER — Inpatient Hospital Stay: Payer: 59 | Attending: Hematology and Oncology | Admitting: Hematology and Oncology

## 2023-03-06 VITALS — BP 144/63 | HR 98 | Temp 98.4°F | Resp 18 | Ht 64.0 in | Wt 238.9 lb

## 2023-03-06 DIAGNOSIS — Z148 Genetic carrier of other disease: Secondary | ICD-10-CM | POA: Insufficient documentation

## 2023-03-06 DIAGNOSIS — Z1501 Genetic susceptibility to malignant neoplasm of breast: Secondary | ICD-10-CM

## 2023-03-06 DIAGNOSIS — G35 Multiple sclerosis: Secondary | ICD-10-CM | POA: Insufficient documentation

## 2023-03-06 DIAGNOSIS — Z853 Personal history of malignant neoplasm of breast: Secondary | ICD-10-CM | POA: Diagnosis present

## 2023-03-06 DIAGNOSIS — Z9221 Personal history of antineoplastic chemotherapy: Secondary | ICD-10-CM | POA: Insufficient documentation

## 2023-03-06 DIAGNOSIS — Z923 Personal history of irradiation: Secondary | ICD-10-CM | POA: Diagnosis not present

## 2023-03-06 DIAGNOSIS — Z1502 Genetic susceptibility to malignant neoplasm of ovary: Secondary | ICD-10-CM

## 2023-03-06 DIAGNOSIS — Z1509 Genetic susceptibility to other malignant neoplasm: Secondary | ICD-10-CM | POA: Diagnosis not present

## 2023-03-06 NOTE — Progress Notes (Signed)
Patient Care Team: Bernadette Hoit, MD as PCP - General (Family Medicine) Osborn Coho, MD (Inactive) Serena Croissant, MD as Consulting Physician (Hematology and Oncology) Dorothy Puffer, MD as Consulting Physician (Radiation Oncology) Sater, Pearletha Furl, MD (Neurology) Almond Lint, MD as Consulting Physician (Surgical Oncology) Wendall Stade, MD as Consulting Physician (Cardiology) Anselm Lis, RPH-CPP as Pharmacist (Hematology and Oncology)  DIAGNOSIS:  Encounter Diagnosis  Name Primary?   BRCA gene mutation positive in female Yes    SUMMARY OF ONCOLOGIC HISTORY: Oncology History  Malignant neoplasm of upper-outer quadrant of right breast in female, estrogen receptor negative (HCC)  04/12/2011 Genetic Testing   + BRCA 2 mutation on Q486X (1684C>T) BRCA2.    01/10/2020 Initial Diagnosis   Palpable right breast mass. Mammogram showed a 1.8cm mass at the 10:30 position 2cm from the nipple, a 1.5cm mass at the 10:30 position 7cm from the nipple, and indeterminate calcifications between the two masses in the right breast. 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    01/12/2020 Cancer Staging   Staging form: Breast, AJCC 8th Edition - Clinical stage from 01/12/2020: Stage IB (cT1c, cN0, cM0, G3, ER-, PR-, HER2-) - Signed by Serena Croissant, MD on 02/03/2020   02/23/2020 - 07/05/2020 Chemotherapy   Patient is on Treatment Plan : BREAST Dose Dense AC q14d / CARBOplatin D1 + PACLitaxel D1,8,15 q21d     08/08/2020 Surgery   Right breast lumpectomy: scattered foci of DCIS with calcifications, intermediate to high-grade, largest measuring 0.3 cm, no residual invasive carcinoma, resection margins negative for DCIS, and all 3 axillary nodes negative for carcinoma   11/21/2020 - 12/18/2020 Radiation Therapy   11/21/2020 through 12/18/2020 Site Technique Total Dose (Gy) Dose per Fx (Gy) Completed Fx Beam Energies   Breast, Right: Breast_Rt 3D 42.56/42.56 2.66 16/16 10X  Breast, Right: Breast_Rt_Bst 3D 8/8 2 4/4 6X, 10X     03/04/2021 Miscellaneous   Olaparib started at 150mg  twice daily Increased to 300mg  BID 04/30/2021     CHIEF COMPLIANT: Surveillance of breast cancer  HISTORY OF PRESENT ILLNESS:   History of Present Illness   The patient, with a history of multiple sclerosis (MS) and a benign breast papilloma, presents for a routine follow-up. She reports stable MS symptoms, primarily fatigue, which she attributes to her chronic condition. She has been managing her MS with a regimen that includes a regular MS shot and ibuprofen for associated discomfort. However, she recently switched to Tylenol after a minor eye injury, as ibuprofen was thought to potentially exacerbate bleeding.  The patient's MS has been stable for an extended period, and she is considering discontinuing Humet with her specialist's guidance. She also manages occasional allergies with albuterol, Allegra, and Flonase as needed.  Regarding her breast health, she had a benign papilloma identified on a previous biopsy. She has been diligent with her mammograms and MRIs, with a mammogram scheduled for next month and an MRI planned for August.         ALLERGIES:  is allergic to nabumetone, procaine, and shellfish allergy.  MEDICATIONS:  Current Outpatient Medications  Medication Sig Dispense Refill   Calcium Carb-Cholecalciferol (CALCIUM 600+D3 PO) Take 1 tablet by mouth in the morning and at bedtime.     ELDERBERRY PO Take by mouth. Patient report taking 2 gummies daily     fexofenadine (ALLEGRA) 180 MG tablet Take 180 mg by mouth daily as needed for allergies.  fluticasone (FLONASE) 50 MCG/ACT nasal spray Place 2 sprays into both nostrils daily. 16 g 2   ibuprofen (ADVIL) 200 MG tablet Take 600 mg by mouth as needed. W/Avonex injection     interferon beta-1a (AVONEX PREFILLED) 30 MCG/0.5ML PSKT injection INJECT ONE SYRINGE  INTRAMUSCULARLY ONCE WEEKLY. REFRIGERATE. PROTECT FROM LIGHT. ALLOW TO WARM TO ROOM TEMPERATURE PRIOR TO USE. 3 each 3   magnesium oxide (MAG-OX) 400 (240 Mg) MG tablet Take 400 mg by mouth daily. Use for headaches due to olaparib per neurology OTC     metoprolol succinate (TOPROL-XL) 25 MG 24 hr tablet Take 25 mg by mouth daily.     Multiple Vitamin (MULTIVITAMIN WITH MINERALS) TABS tablet Take 1 tablet by mouth every evening. Centrum     potassium chloride SA (KLOR-CON M20) 20 MEQ tablet TAKE 1 TABLET BY MOUTH EVERY DAY IN THE MORNING 90 tablet 3   simvastatin (ZOCOR) 20 MG tablet Take 20 mg by mouth every evening.     VENTOLIN HFA 108 (90 BASE) MCG/ACT inhaler Inhale 1-2 puffs into the lungs every 6 (six) hours as needed for shortness of breath or wheezing.     No current facility-administered medications for this visit.    PHYSICAL EXAMINATION: ECOG PERFORMANCE STATUS: 1 - Symptomatic but completely ambulatory  Vitals:   03/06/23 1136  BP: (!) 144/63  Pulse: 98  Resp: 18  Temp: 98.4 F (36.9 C)  SpO2: 99%   Filed Weights   03/06/23 1136  Weight: 238 lb 14.4 oz (108.4 kg)    Physical Exam   HEENT: Subconjunctival hemorrhage present.      (exam performed in the presence of a chaperone)  LABORATORY DATA:  I have reviewed the data as listed    Latest Ref Rng & Units 03/05/2022   10:29 AM 12/13/2021    8:43 AM 10/18/2021    8:46 AM  CMP  Glucose 70 - 99 mg/dL 88  960  454   BUN 6 - 20 mg/dL 7  7  7    Creatinine 0.44 - 1.00 mg/dL 0.98  1.19  1.47   Sodium 135 - 145 mmol/L 142  141  141   Potassium 3.5 - 5.1 mmol/L 3.9  3.5  3.5   Chloride 98 - 111 mmol/L 105  105  107   CO2 22 - 32 mmol/L 31  29  28    Calcium 8.9 - 10.3 mg/dL 9.5  9.1  9.1   Total Protein 6.5 - 8.1 g/dL 7.1  7.5  7.2   Total Bilirubin 0.3 - 1.2 mg/dL 0.4  0.5  0.5   Alkaline Phos 38 - 126 U/L 87  78  84   AST 15 - 41 U/L 22  19  17    ALT 0 - 44 U/L 22  16  14      Lab Results  Component Value Date    WBC 11.8 (H) 03/05/2022   HGB 12.7 03/05/2022   HCT 37.7 03/05/2022   MCV 84.2 03/05/2022   PLT 262 03/05/2022   NEUTROABS 7.5 03/05/2022    ASSESSMENT & PLAN:  Malignant neoplasm of upper-outer quadrant of right breast in female, estrogen receptor negative (HCC) 01/10/2020:Palpable right breast mass. Mammogram showed a 1.8cm mass at the 10:30 position 2cm from the nipple, a 1.5cm mass at the 10:30 position 7cm from the nipple, and indeterminate calcifications between the two masses in the right breast. Biopsy showed IDC with DCIS, grade 2, at both masses, HER-2 negative by FISH (ratio  1.77), ER/PR negative, Ki67 80%.  BRCA2 Mutation   Treatment plan: 1. Neoadjuvant chemotherapy with Adriamycin and Cytoxan every 2 weeks 4 followed by Taxol weekly 12 with carboplatin and  every 3 weeks x4 completed 07/05/2020 2. 08/08/20 breast conserving surgery with sentinel lymph node study : scattered foci of DCIS, margins Neg, 0/3 LN Neg 3. Adjuvant radiation therapy 11/22/20- 12/18/20 4. Adjuvant olaparib started 02/20/21- 02/20/22 URCC nausea study   Breast MRI 01/14/2020: 2 cm spiculated mass UOQ right breast, 2.1 cm spiculated mass UOQ right breast, biopsy-proven malignancies.  7 mm suspicious enhancing mass: Biopsy 02/02/2020: High-grade DCIS -----------------------------------------------------------------------------------------------------------------------------------------------   Breast cancer surveillance:  1.  Mammograms 04/07/2023: Benign 2. breast MRI 09/13/2022: Indeterminate 5 mm mass left outer lower quadrant: MRI biopsy: Fibrocystic change with usual ductal hyperplasia 3.  Breast exam 03/05/2022: Benign   she had prophylactic oophorectomy.   Brain MRI: 07/11/21: Findings of MS. No brain mets Return to clinic in 1 year for follow-up ------------------------------------- Assessment and Plan    Multiple Sclerosis Stable with fatigue as the main symptom. Currently on Humet, with a  discussion of possibly discontinuing the medication at the next neurology appointment in February due to prolonged stability. -Continue current management and follow-up with neurologist as planned.  Breast Health History of benign papilloma. Mammogram scheduled for February 24th and MRI planned for August. -Continue with scheduled mammogram and MRI.  Allergies Managed with albuterol, Allegra, and Flonase as needed. -Continue current management.  Eye Health Recent broken blood vessel in the eye due to sneezing and nose blowing. Advised to avoid ibuprofen due to its blood-thinning properties. -Avoid ibuprofen to prevent further eye bleeding.  Bone Health Last year's bone density scan showed excellent results. -Continue current lifestyle and dietary habits to maintain bone health.          Orders Placed This Encounter  Procedures   MR BREAST BILATERAL W WO CONTRAST INC CAD    Standing Status:   Future    Expected Date:   09/15/2023    Expiration Date:   03/05/2024    If indicated for the ordered procedure, I authorize the administration of contrast media per Radiology protocol:   Yes    What is the patient's sedation requirement?:   No Sedation    Does the patient have a pacemaker or implanted devices?:   No    Preferred imaging location?:   [facility] (table limit-550lbs)    Release to patient:   Immediate   The patient has a good understanding of the overall plan. she agrees with it. she will call with any problems that may develop before the next visit here. Total time spent: 30 mins including face to face time and time spent for planning, charting and co-ordination of care   Tamsen Meek, MD 03/06/23

## 2023-03-06 NOTE — Assessment & Plan Note (Signed)
01/10/2020:Palpable right breast mass. Mammogram showed a 1.8cm mass at the 10:30 position 2cm from the nipple, a 1.5cm mass at the 10:30 position 7cm from the nipple, and indeterminate calcifications between the two masses in the right breast. 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  BRCA2 Mutation   Treatment plan: 1. Neoadjuvant chemotherapy with Adriamycin and Cytoxan every 2 weeks 4 followed by Taxol weekly 12 with carboplatin and  every 3 weeks x4 completed 07/05/2020 2. 08/08/20 breast conserving surgery with sentinel lymph node study : scattered foci of DCIS, margins Neg, 0/3 LN Neg 3. Adjuvant radiation therapy 11/22/20- 12/18/20 4. Adjuvant olaparib started 02/20/21 URCC nausea study   Breast MRI 01/14/2020: 2 cm spiculated mass UOQ right breast, 2.1 cm spiculated mass UOQ right breast, biopsy-proven malignancies.  7 mm suspicious enhancing mass: Biopsy 02/02/2020: High-grade DCIS ----------------------------------------------------------------------------------------------------------------------------------------------- Olaparib: Garey Ham is a PARP inhibitor started 02/20/2021 (1 year of total therapy)   [place] trial showed that the recurrence rate improves with olaparib.  86% versus 77%. Dose: 300 mg twice daily reduced to 150 p.o. twice daily starting 07/12/2021 (this will be her dose for the rest of the treatment)   Knee injury: Patient was getting an MRI to evaluate the knee.   Breast cancer surveillance:  1.  Mammograms 04/07/2023: Benign 2. breast MRI 09/13/2022: Indeterminate 5 mm mass left outer lower quadrant: MRI biopsy: Fibrocystic change with usual ductal hyperplasia 3.  Breast exam 03/05/2022: Benign   she had prophylactic oophorectomy.   Brain MRI: 07/11/21: Findings of MS. No brain mets Return to clinic in 1 year for follow-up

## 2023-03-17 ENCOUNTER — Encounter: Payer: Self-pay | Admitting: Neurology

## 2023-03-17 ENCOUNTER — Ambulatory Visit: Payer: 59 | Admitting: Neurology

## 2023-03-17 VITALS — BP 139/88 | HR 90 | Ht 64.0 in | Wt 235.0 lb

## 2023-03-17 DIAGNOSIS — G35 Multiple sclerosis: Secondary | ICD-10-CM

## 2023-03-17 DIAGNOSIS — R208 Other disturbances of skin sensation: Secondary | ICD-10-CM

## 2023-03-17 DIAGNOSIS — G43009 Migraine without aura, not intractable, without status migrainosus: Secondary | ICD-10-CM | POA: Diagnosis not present

## 2023-03-17 DIAGNOSIS — R5383 Other fatigue: Secondary | ICD-10-CM | POA: Diagnosis not present

## 2023-03-17 NOTE — Progress Notes (Signed)
[facility]  PATIENT: Jacqueline Adams DOB: [birth year]  REFERRING DOCTOR OR PCP:  Jacqueline Adams  _________________________________   HISTORICAL  CHIEF COMPLAINT:  Chief Complaint  Patient presents with   Multiple Sclerosis    Rm 10 alone Pt is well and stable, reports no new MS concerns. Has about 4-5 headaches a month.     HISTORY OF PRESENT ILLNESS:   Jacqueline Adams is a 63 y.o. woman with relapsing remitting MS diagnosed in 2009.    Update  03/17/2023: She is on Jacqueline Adams and tolerates it well.    She is getting the drug through an assistance.  She tolerates it well.  No injection reactions.     No skin reactios   Gait does well on well on flat surfaces.     She usually goes downstairs without the banister.   She can walk 2 miles.   She has pain and numbness shooting pain in her right leg into her thigh at times but this is not worsening and not bad enough to treat.   Bladder is doing well.   Vision is fine.    She denies much fatigue.   Her sleep pattern is better.  Sleep was wrse when on chemo.   She is walking some everyday.   She denies depression.and tried to keep a positive attitude.  Cognition is doing well.    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    She was on carboplatin and paclitaxel.   She gets shooting pain in her right hand/arm at times only.    She finished radiation 12/2020.     She sees Jacqueline Adams and last him 2 weeks ago.   She has a mammogram in a couple weeks and breast MRI in 6 months.   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  Sometimes triggered by weather patterns and allergies.      MS History:  In 2009, she had the onset of right arm and leg tingling and clumsiness. At the time, she was seeing Dr. Meryl Adams for migraines and he performed a nerve conduction study only showing mild carpal tunnel  syndrome. Symptoms worsened the next day and she was referred to Dr. Sandria Adams. An MRI of the brain, cervical spine and a lumbar puncture was performed. The brain MRI showed a large left frontoparietal periventricular focus and several other foci. CSF was consistent with multiple sclerosis.  MRI of the cervical spine was normal. She was started on Jacqueline Adams.   She was switched to Jacqueline Adams in late 2014 as she had one new focus on her 2014 MRI. She switched back to Jacqueline Adams in 2016..  Imaging MRI of the brain 04/29/2018 showed T2/FLAIR hyperintense foci in the hemispheres in a pattern consistent with demyelination/MS.  None of the foci were acute and there were no new lesions compared to the MRI from 2018.  MRI of the brain 05/22/2016 showed T2/FLAIR foci in the hemispheres consistent with MS.  There were no acute findings.  No new lesions compared to 05/14/2015  MRI of the cervical spine 05/22/2016 showed a normal spinal cord and mild disc degenerative changes but no nerve root compression or spinal stenosis.  MRO of the brain 09/19/2020 showed Several T2/FLAIR hyperintense foci in the left hemisphere in a pattern consistent with chronic demyelinating plaque associated with multiple sclerosis.  None  of the foci enhance.  Compared to the MRI from 04/29/2018, there are no new lesions.  MRI cervical spine showed a normal spinal cord and DJD but no spinal stenosis or nerve root compression  REVIEW OF SYSTEMS: Constitutional: No fevers, chills, sweats, or change in appetite.   She has fatigue Eyes: No visual changes, double vision, eye pain.   Being followed for borderline high intraocular pressure. Ear, nose and throat: No hearing loss, ear pain/.    Respiratory:  No shortness of breath at rest or with exertion.   No wheezes.   She has frequent bronchitis. Cardiovascular: No chest pain, palpitations GastrointestinaI: No nausea, vomiting, diarrhea, abdominal pain, fecal incontinence Genitourinary:  No dysuria.  She has  urgency and or frequency.  2-3 x  nocturia. Musculoskeletal:  No neck pain, back pain Integumentary: No rash, pruritus, skin lesions Neurological: as above Psychiatric: No depression at this time.  No anxiety Endocrine: No palpitations, diaphoresis, change in appetite, change in weigh or increased thirst Hematologic/Lymphatic:  No anemia, purpura, petechiae. Allergic/Immunologic: No itchy/runny eyes, nasal congestion, recent allergic reactions, rashes  ALLERGIES: Allergies  Allergen Reactions   Nabumetone Swelling and Hives   Procaine Swelling   Shellfish Allergy Anaphylaxis    HOME MEDICATIONS:  Current Outpatient Medications:    Calcium Carb-Cholecalciferol (CALCIUM 600+D3 PO), Take 1 tablet by mouth in the morning and at bedtime., Disp: , Rfl:    Ergocalciferol 10 MCG (400 UNIT) TABS, Take by mouth., Disp: , Rfl:    fexofenadine (ALLEGRA) 180 MG tablet, Take 180 mg by mouth daily as needed for allergies., Disp: , Rfl:    fluticasone (FLONASE) 50 MCG/ACT nasal spray, Place 2 sprays into both nostrils daily., Disp: 16 g, Rfl: 2   ibuprofen (ADVIL) 200 MG tablet, Take 600 mg by mouth as needed. W/Jacqueline Adams injection, Disp: , Rfl:    interferon beta-1a (Jacqueline Adams PREFILLED) 30 MCG/0.5ML PSKT injection, INJECT ONE SYRINGE INTRAMUSCULARLY ONCE WEEKLY. REFRIGERATE. PROTECT FROM LIGHT. ALLOW TO WARM TO ROOM TEMPERATURE PRIOR TO USE., Disp: 3 each, Rfl: 3   magnesium oxide (MAG-OX) 400 (240 Mg) MG tablet, Take 400 mg by mouth daily. Use for headaches due to olaparib per neurology OTC, Disp: , Rfl:    metoprolol succinate (TOPROL-XL) 25 MG 24 hr tablet, Take 25 mg by mouth daily., Disp: , Rfl:    Multiple Vitamin (MULTIVITAMIN WITH MINERALS) TABS tablet, Take 1 tablet by mouth every evening. Centrum, Disp: , Rfl:    potassium chloride SA (KLOR-CON M20) 20 MEQ tablet, TAKE 1 TABLET BY MOUTH EVERY DAY IN THE MORNING, Disp: 90 tablet, Rfl: 3   simvastatin (ZOCOR) 20 MG tablet, Take 20 mg by mouth every  evening., Disp: , Rfl:    VENTOLIN HFA 108 (90 BASE) MCG/ACT inhaler, Inhale 1-2 puffs into the lungs every 6 (six) hours as needed for shortness of breath or wheezing., Disp: , Rfl:   PAST MEDICAL HISTORY: Past Medical History:  Diagnosis Date   BRCA2 gene mutation positive in female    Breast cancer (HCC) 01/2020   right breast IDC   Bronchitis    Cervical spondylarthritis    Headache(784.0)    High cholesterol    Hypertension    Leukocytosis 2009   Multiple sclerosis (HCC) 2009   followed by Dr. Sandria Adams   Neuromuscular disorder [facility])    Personal history of chemotherapy    Personal history of radiation therapy    Pneumonia    Rhinitis    Seasonal allergies  Sinusitis     PAST SURGICAL HISTORY: Past Surgical History:  Procedure Laterality Date   ABDOMINAL HYSTERECTOMY  2006   uterus, cervix, portion of fallopian tubes for AUB   BREAST BIOPSY Left 10/10/2021   BREAST BIOPSY Right 02/02/2020   BREAST BIOPSY Right 01/28/2020   BREAST BIOPSY Right 12/31/2019   times 2   BREAST LUMPECTOMY Right 08/08/2020   BREAST LUMPECTOMY WITH RADIOACTIVE SEED AND SENTINEL LYMPH NODE BIOPSY Right 08/08/2020   Procedure: RIGHT BREAST LUMPECTOMY WITH RADIOACTIVE SEED X 3 AND SENTINEL LYMPH NODE BIOPSY;  Surgeon: Almond Lint, MD;  Location: [facility] OR;  Service: General;  Laterality: Right;   CESAREAN SECTION     x 3   CORRECTION HAMMER TOE Bilateral    PORT-A-CATH REMOVAL Right 09/21/2020   Procedure: REMOVAL PORT-A-CATH;  Surgeon: Almond Lint, MD;  Location: [facility] OR;  Service: General;  Laterality: Right;   PORTACATH PLACEMENT Right 02/22/2020   Procedure: RIGHT SUBCLAVIAN VEIN PORT PLACEMENT;  Surgeon: Manus Rudd, MD;  Location: [facility];  Service: General;  Laterality: Right;   ROBOTIC ASSISTED BILATERAL SALPINGO OOPHERECTOMY Bilateral 01/30/2021   Procedure: XI ROBOTIC ASSISTED BILATERAL SALPINGO OOPHORECTOMY, LYSIS OF ADHESIONS, 1 HOUR;  Surgeon: Carver Fila,  MD;  Location: [facility];  Service: Gynecology;  Laterality: Bilateral;    FAMILY HISTORY: Family History  Problem Relation Age of Onset   Cancer Mother 37       ovarian cancer; now with suspected breast cancer   Diabetes Mother        also HTN   Breast cancer Mother    Clotting disorder Father    Prostate cancer Father    Colon cancer Sister 27       anal cancer (2014)   Lung cancer Sister 39   Stroke Sister 58   Ovarian cancer Sister 76   BRCA 1/2 Sister    Breast cancer Maternal Aunt    Breast cancer Maternal Aunt    Breast cancer Maternal Aunt    Cancer Maternal Grandmother        Jacqueline female reproductive organ cancer   Endometrial cancer Niece    Pancreatic cancer Neg Hx     SOCIAL HISTORY:  Social History   Socioeconomic History   Marital status: Married    Spouse name: John   Number of children: 2   Years of education: Nature conservation officer education level: Not on file  Occupational History   Occupation: Management consultant: [employer]  Tobacco Use   Smoking status: Never   Smokeless tobacco: Never  Vaping Use   Vaping status: Never Used  Substance and Sexual Activity   Alcohol use: No   Drug use: No   Sexual activity: Yes    Birth control/protection: Surgical  Other Topics Concern   Not on file  Social History Narrative   Patient lives at home with her spouse.   Caffeine Use: 2 sodas daily   Social Drivers of Health   Financial Resource Strain: Low Risk  (04/25/2020)   [facility] prior to 04/13/2022., [facility] prior to 04/13/2022.   Overall Financial Resource Strain (CARDIA)    Difficulty of Paying Living Expenses: Not hard at all  Food Insecurity: Low Risk  (07/25/2022)   [facility]   Hunger Vital Sign    Worried About Running Out of Food in the Last Year: Never true  Ran Out of Food in the Last Year: Never true  Transportation Needs: No  Transportation Needs (07/25/2022)   [facility]   Transportation    In the past 12 months, has lack of reliable transportation kept you from medical appointments, meetings, work or from getting things needed for daily living? : No  Physical Activity: Sufficiently Active (04/25/2020)   [facility] prior to 04/13/2022., [facility] visits prior to 04/13/2022.   Exercise Vital Sign    Days of Exercise per Week: 5 days    Minutes of Exercise per Session: 30 min  Stress: No Stress Concern Present (04/25/2020)   [facility] prior to 04/13/2022., [facility] visits prior to 04/13/2022.   [employer] of Occupational Health - Occupational Stress Questionnaire    Feeling of Stress : Not at all  Social Connections: Jacqueline (04/25/2020)   [facility] prior to 04/13/2022., [facility] visits prior to 04/13/2022.   Social Connection and Isolation Panel [NHANES]    Frequency of Communication with Friends and Family: More than three times a week    Frequency of Social Gatherings with Friends and Family: Not on file    Attends Religious Services: Not on file    Active Member of Clubs or Organizations: Yes    Attends Banker Meetings: Not on file    Marital Status: Married  Intimate Partner Violence: Not At Risk (04/25/2020)   [facility] prior to 04/13/2022., [facility] visits prior to 04/13/2022.   Humiliation, Afraid, Rape, and Kick questionnaire    Fear of Current or Ex-Partner: No    Emotionally Abused: No    Physically Abused: No    Sexually Abused: No     PHYSICAL EXAM  Vitals:   03/17/23 1005  BP: 139/88  Pulse: 90  Weight: 235 lb (106.6 kg)  Height: 5\' 4"  (1.626 m)    Body mass index is 40.34  kg/m.   General: The patient is well-developed and well-nourished and in no acute distress.  Wearing left knee brace   Neurologic Exam  Mental status: The patient is alert and oriented x 3 at the time of the examination. The patient has apparent normal recent and remote memory, with an apparently normal attention span and concentration ability.   Speech is normal.  Cranial nerves: Extraocular movements are full.  Color vision is symmetric.   Facial strength and sensation was normal.  Trapezius strength is normal.  No obvious hearing deficits are noted.  Motor:  Muscle bulk is normal.   Tone is normal. Strength is  5 / 5 in all 4 extremities.   Sensory: Normal sensation to touch and vibration in bilateral arms and legs.   Coordination: Cerebellar testing reveals good finger-nose-finger and heel-to-shin bilaterally.  Gait and station: Station is normal.  Gait was fairly normal for age.  Tandem gait was mildly wide.[name]  Her Romberg was negative.    Reflexes: Deep tendon reflexes are normal and symmetric in the arms.  Deep tendon reflexes are mildly increased at the knees.       DIAGNOSTIC DATA (LABS, IMAGING, TESTING) - I reviewed patient records, labs, notes, testing and imaging myself where available.  Lab Results  Component Value Date   WBC 11.8 (H) 03/05/2022   HGB 12.7  03/05/2022   HCT 37.7 03/05/2022   MCV 84.2 03/05/2022   PLT 262 03/05/2022      Component Value Date/Time   NA 142 03/05/2022 1029   NA 142 05/05/2020 1625   NA 143 11/22/2013 1514   K 3.9 03/05/2022 1029   K 3.1 (L) 11/22/2013 1514   CL 105 03/05/2022 1029   CL 105 02/18/2012 1207   CO2 31 03/05/2022 1029   CO2 25 11/22/2013 1514   GLUCOSE 88 03/05/2022 1029   GLUCOSE 105 11/22/2013 1514   GLUCOSE 96 02/18/2012 1207   BUN 7 03/05/2022 1029   BUN 8 05/05/2020 1625   BUN 6.6 (L) 11/22/2013 1514   CREATININE 0.77 03/05/2022 1029   CREATININE 0.8 11/22/2013 1514   CALCIUM 9.5 03/05/2022 1029    CALCIUM 9.0 11/22/2013 1514   PROT 7.1 03/05/2022 1029   PROT 7.0 09/22/2019 0840   PROT 7.4 11/22/2013 1514   ALBUMIN 3.9 03/05/2022 1029   ALBUMIN 4.0 09/22/2019 0840   ALBUMIN 3.3 (L) 11/22/2013 1514   AST 22 03/05/2022 1029   AST 15 11/22/2013 1514   ALT 22 03/05/2022 1029   ALT 13 11/22/2013 1514   ALKPHOS 87 03/05/2022 1029   ALKPHOS 91 11/22/2013 1514   BILITOT 0.4 03/05/2022 1029   BILITOT 0.23 11/22/2013 1514   GFRNONAA >60 03/05/2022 1029   GFRAA 113 09/22/2019 0840       ASSESSMENT AND PLAN    1. Multiple sclerosis (HCC)   2. Dysesthesia   3. Other fatigue   4. Migraine without aura and without status migrainosus, not intractable      1.  Continue Jacqueline Adams..   She has recent blood work and does not need labs today.    Recheck MRI to see if progression.  If this is occurring, we will need to consider a stronger disease modifying therapy.  If she continues to be relapse free and does not have any new lesions on MRI, consider discontinuing disease modifying therapies around age 20 or 72. 2.  Continue vit D supplementation, if low today increase amount.   3.  Try to stay active and exercises as tolerated.   Use bannister on stairs 4.  She is continuing to follow-up with oncology for her breast cancer.   5.   She will return to see [place] in 6 months or sooner if there are new or worsening neurologic symptoms.   Mylik Pro A. Epimenio Foot, MD, PhD 03/17/2023, 10:28 AM Certified in Neurology, Clinical Neurophysiology, Sleep Medicine, Pain Medicine and Neuroimaging  [facility] [address], [address] [place], [place] [address] [phone]

## 2023-03-25 ENCOUNTER — Ambulatory Visit: Payer: 59

## 2023-03-25 ENCOUNTER — Encounter: Payer: Self-pay | Admitting: Neurology

## 2023-03-25 DIAGNOSIS — G35 Multiple sclerosis: Secondary | ICD-10-CM | POA: Diagnosis not present

## 2023-03-25 MED ORDER — GADOBENATE DIMEGLUMINE 529 MG/ML IV SOLN
20.0000 mL | Freq: Once | INTRAVENOUS | Status: AC | PRN
  Administered 2023-03-25: 20 mL via INTRAVENOUS

## 2023-04-07 ENCOUNTER — Ambulatory Visit
Admission: RE | Admit: 2023-04-07 | Discharge: 2023-04-07 | Disposition: A | Discharge status: Home / Self Care | Payer: 59 | Source: Ambulatory Visit | Attending: Hematology and Oncology | Admitting: Hematology and Oncology

## 2023-04-07 DIAGNOSIS — Z853 Personal history of malignant neoplasm of breast: Secondary | ICD-10-CM

## 2023-04-09 ENCOUNTER — Ambulatory Visit: Payer: 59 | Admitting: Family Medicine

## 2023-04-15 ENCOUNTER — Ambulatory Visit: Payer: Self-pay | Admitting: Family Medicine

## 2023-04-15 NOTE — Telephone Encounter (Signed)
  Chief Complaint: cough, wheezing Symptoms: congestion, cough, wheezing, headache, fatigue Frequency: 3 days Pertinent Negatives: Patient denies fever Disposition: [] ED /[] Urgent Care (no appt availability in office) / [] Appointment(In office/virtual)/ []  [place] [facility]/ [] Home Care/ [] Refused Recommended Disposition /[] [place] [facility]/ [x]  Follow-up with PCP Additional Notes: Patient provider has changed location- patient is calling to request to be seen for acute symptoms. Call to [facility]- advised to send message for provider review and patient call back. Patient is aware.     Copied from CRM [id number]. Topic: Clinical - Red Word Triage >> Apr 15, 2023  8:14 AM Tiffany B wrote: Red Word that prompted transfer to Nurse Triage: Patient is experincing wheezing for 3 days. Reason for Disposition  [1] MILD difficulty breathing (e.g., minimal/no SOB at rest, SOB with walking, pulse <100) AND [2] NEW-onset or WORSE than normal  Answer Assessment - Initial Assessment Questions 1. RESPIRATORY STATUS: "Describe your breathing?" (e.g., wheezing, shortness of breath, unable to speak, severe coughing)      Wheezing,cough, congestion, sinus headache 2. ONSET: "When did this breathing problem begin?"      3 days 3. PATTERN "Does the difficult breathing come and go, or has it been constant since it started?"      Constant yesterday 4. SEVERITY: "How bad is your breathing?" (e.g., mild, moderate, severe)    - MILD: No SOB at rest, mild SOB with walking, speaks normally in sentences, can lie down, no retractions, pulse < 100.    - MODERATE: SOB at rest, SOB with minimal exertion and prefers to sit, cannot lie down flat, speaks in phrases, mild retractions, audible wheezing, pulse 100-120.    - SEVERE: Very SOB at rest, speaks in single words, struggling to breathe, sitting hunched forward, retractions, pulse > 120      No SOB 5. RECURRENT SYMPTOM: "Have you had difficulty breathing  before?" If Yes, ask: "When was the last time?" and "What happened that time?"      Yes- sinus infection/bronchitis 6. CARDIAC HISTORY: "Do you have any history of heart disease?" (e.g., heart attack, angina, bypass surgery, angioplasty)      Hx chemo/heart disease 7. LUNG HISTORY: "Do you have any history of lung disease?"  (e.g., pulmonary embolus, asthma, emphysema)     DOE 8. CAUSE: "What do you think is causing the breathing problem?"      bronchitis 9. OTHER SYMPTOMS: "Do you have any other symptoms? (e.g., dizziness, runny nose, cough, chest pain, fever)     Cough, congestion, headache,fatigue, nausea  Protocols used: Breathing Difficulty-A-AH

## 2023-04-16 ENCOUNTER — Ambulatory Visit: Admitting: Family Medicine

## 2023-04-16 ENCOUNTER — Encounter: Payer: Self-pay | Admitting: Family Medicine

## 2023-04-16 VITALS — BP 124/82 | HR 100 | Temp 98.5°F | Ht 64.0 in | Wt 238.2 lb

## 2023-04-16 DIAGNOSIS — G35 Multiple sclerosis: Secondary | ICD-10-CM

## 2023-04-16 DIAGNOSIS — E785 Hyperlipidemia, unspecified: Secondary | ICD-10-CM | POA: Diagnosis not present

## 2023-04-16 DIAGNOSIS — K760 Fatty (change of) liver, not elsewhere classified: Secondary | ICD-10-CM

## 2023-04-16 DIAGNOSIS — J019 Acute sinusitis, unspecified: Secondary | ICD-10-CM | POA: Diagnosis not present

## 2023-04-16 DIAGNOSIS — Z1211 Encounter for screening for malignant neoplasm of colon: Secondary | ICD-10-CM

## 2023-04-16 DIAGNOSIS — E1165 Type 2 diabetes mellitus with hyperglycemia: Secondary | ICD-10-CM | POA: Diagnosis not present

## 2023-04-16 MED ORDER — DOXYCYCLINE HYCLATE 100 MG PO TABS: 100.0000 mg | ORAL_TABLET | Freq: Two times a day (BID) | ORAL | 0 refills | Status: AC

## 2023-04-16 NOTE — Progress Notes (Signed)
 Patient Office Visit  Assessment & Plan:  Acute sinusitis, recurrence not specified, unspecified location -     Doxycycline Hyclate; Take 1 tablet (100 mg total) by mouth 2 (two) times daily for 10 days.  Dispense: 20 tablet; Refill: 0  Type 2 diabetes mellitus with hyperglycemia, without long-term current use of insulin (HCC) -     Comprehensive metabolic panel with eGFR ([facility] ONLY) [LAB17001]  Hyperlipidemia, unspecified hyperlipidemia type  Multiple sclerosis (HCC)  Fatty liver -     Comprehensive metabolic panel with eGFR ([facility] ONLY) [LAB17001]  Screening for colorectal cancer -     Ambulatory referral to Gastroenterology   Test results were reviewed and analyzed as part of the medical decision making of this visit.  Reviewed previous notes from [facility], urgent care, oncology and neurology during the office visit. Follow-up on lab work and notify patient.  GI consult ordered at [facility] for colon cancer screening.  Return in the next 3 months or sooner if necessary. Return in about 3 months (around 07/17/2023), or if symptoms worsen or fail to improve.   Subjective:    Patient ID: Jacqueline Adams, female    DOB: 1960/08/15  Age: 63 y.o. MRN: [id number]  Chief Complaint  Patient presents with   Establish Care   Wheezing    X 3 days.    HPI Sinusitis-has been having congestion, sinus pressure teeth hurting postnasal drip and coughing for about one mos but worse the past 3-4 days.  Pt got meds about one mos ago at work,  got Z pack, steroid shot which helped some. Went to Urgent Care at [facility] yesterday and was given prednisone pack but no antibiotic. Tested for COVID and flu and both were negative. Pt got flu shot last September. No fever or chills. Having some cough, postnasal drip, some shortness of breath and occ wheezing. Pt does have albuterol inhaler on hand. Pt has hx of allergies and asthma.  Patient does okay with Augmentin however she  usually gets yeast infection afterwards HTN-using antihypertensive medication without difficulty.  Denies associated signs and symptoms including chest pain, shortness of breath, cough headache, peripheral swelling cramps spasms and palpitations.  Voices understanding of the potential for interference with blood pressure control with substances including high sodium intake, decongestions, herbal supplements weight loss supplements nutritional supplements.  Blood pressures at home are less than 140/90.   Hyperlipidemia-denies unusual muscle aches or muscle cramps or difficulty tolerating statin therapy.  Aware of need for diet control, exercise and healthy eating.  Patient is aware not to consume grapefruit juice or pomegranate juice. Fatty liver-patient had a CAT scan last year which revealed fatty liver.  LFTs were slightly elevated at 1 point.  Patient did get off track with her diet and exercise routine but plans on restarting this soon.  Patient knows that she needs to improve her diet and be more active.  Patient has not felt very well in the past few weeks. Type 2 diabetes-previously controlled.  Denies diarrhea, peripheral swelling, hypoglycemia, excessive thirst, excessive urination, visual fluctuation, and fatigue.  Making an effort on diet control and exercise.  Has an up-to-date dilated eye exam.  Not using meds at this time.   Patient's weight remains stable.  Patient does eat healthy for the most part and stays active.  Patient would like to get a continuous glucose monitor. MS- has been diagnosed since 2009. Pt has seen Dr. Epimenio Foot in [place] every 6 mos. Pt continues to  do well on Avonex. Medication has worked for her.  Breast CA-patient is BRCA2 mutation positive. pt had recent mammogram and will do Breast MRI in 6 mos.   The 10-year ASCVD risk score (Arnett DK, et al., 2019) is: 12%  Past Medical History:  Diagnosis Date   Allergy 1979   Shellfish alergy   Anemia    BRCA2 gene mutation  positive in female    Breast cancer (HCC) 01/2020   right breast IDC   Bronchitis    Cervical spondylarthritis    Headache(784.0)    High cholesterol    Hypertension    Leukocytosis 2009   Multiple sclerosis (HCC) 2009   followed by Dr. Sandria Manly   Neuromuscular disorder [facility])    Personal history of chemotherapy    Personal history of radiation therapy    Pneumonia    Rhinitis    Seasonal allergies    Sinusitis    Past Surgical History:  Procedure Laterality Date   ABDOMINAL HYSTERECTOMY  2006   uterus, cervix, portion of fallopian tubes for AUB   BREAST BIOPSY Left 10/10/2021   BREAST BIOPSY Right 02/02/2020   BREAST BIOPSY Right 01/28/2020   BREAST BIOPSY Right 12/31/2019   times 2   BREAST LUMPECTOMY Right 08/08/2020   BREAST LUMPECTOMY WITH RADIOACTIVE SEED AND SENTINEL LYMPH NODE BIOPSY Right 08/08/2020   Procedure: RIGHT BREAST LUMPECTOMY WITH RADIOACTIVE SEED X 3 AND SENTINEL LYMPH NODE BIOPSY;  Surgeon: Almond Lint, MD;  [facility];  Service: General;  Laterality: Right;   CESAREAN SECTION     x 3   CORRECTION HAMMER TOE Bilateral    PORT-A-CATH REMOVAL Right 09/21/2020   Procedure: REMOVAL PORT-A-CATH;  Surgeon: Almond Lint, MD;  [facility]: [facility];  Service: General;  Laterality: Right;   PORTACATH PLACEMENT Right 02/22/2020   Procedure: RIGHT SUBCLAVIAN VEIN PORT PLACEMENT;  Surgeon: Manus Rudd, MD;  Location: [facility];  Service: General;  Laterality: Right;   ROBOTIC ASSISTED BILATERAL SALPINGO OOPHERECTOMY Bilateral 01/30/2021   Procedure: XI ROBOTIC ASSISTED BILATERAL SALPINGO OOPHORECTOMY, LYSIS OF ADHESIONS, 1 HOUR;  Surgeon: Carver Fila, MD;  [facility];  Service: Gynecology;  Laterality: Bilateral;   TUBAL LIGATION  08/04/02   Social History   Tobacco Use   Smoking status: Never   Smokeless tobacco: Never  Vaping Use   Vaping status: Never Used  Substance Use Topics   Alcohol use: Never   Drug use: Never    Family History  Problem Relation Age of Onset   Cancer Mother 96       ovarian cancer; now with suspected breast cancer   Diabetes Mother        also HTN   Breast cancer Mother    Clotting disorder Father    Prostate cancer Father    Colon cancer Sister 50       anal cancer (2014)   Cancer Sister    Lung cancer Sister 76   Cancer Sister    Stroke Sister 76   Ovarian cancer Sister 62   BRCA 1/2 Sister    Breast cancer Maternal Aunt    Cancer Maternal Aunt    Breast cancer Maternal Aunt    Cancer Maternal Aunt    Breast cancer Maternal Aunt    Cancer Maternal Grandmother        unknown female reproductive organ cancer   Endometrial cancer Niece    Pancreatic cancer Neg Hx    Allergies  Allergen Reactions  Nabumetone Swelling and Hives   Procaine Swelling   Shellfish Allergy Anaphylaxis    ROS    Objective:    BP 124/82   Pulse 100   Temp 98.5 F (36.9 C)   Ht 5\' 4"  (1.626 m)   Wt 238 lb 4 oz (108.1 kg)   SpO2 98%   BMI 40.90 kg/m  BP Readings from Last 3 Encounters:  04/16/23 124/82  03/17/23 139/88  03/06/23 (!) 144/63   Wt Readings from Last 3 Encounters:  04/16/23 238 lb 4 oz (108.1 kg)  03/17/23 235 lb (106.6 kg)  03/06/23 238 lb 14.4 oz (108.4 kg)    Physical Exam Vitals and nursing note reviewed.  Constitutional:      Appearance: Normal appearance.  HENT:     Head: Normocephalic.     Right Ear: Tympanic membrane, ear canal and external ear normal.     Left Ear: Tympanic membrane, ear canal and external ear normal.     Nose: Congestion present.     Right Turbinates: Swollen.     Left Turbinates: Swollen.     Right Sinus: Maxillary sinus tenderness present.     Left Sinus: Maxillary sinus tenderness present.     Mouth/Throat:     Pharynx: No oropharyngeal exudate or posterior oropharyngeal erythema.  Eyes:     Extraocular Movements: Extraocular movements intact.     Pupils: Pupils are equal, round, and reactive to light.   Cardiovascular:     Rate and Rhythm: Regular rhythm. Tachycardia present.     Heart sounds: Normal heart sounds.  Pulmonary:     Effort: Pulmonary effort is normal.     Breath sounds: Normal breath sounds. No wheezing or rhonchi.  Musculoskeletal:     Right lower leg: No edema.     Left lower leg: No edema.  Neurological:     General: No focal deficit present.     Mental Status: She is alert and oriented to person, place, and time.  Psychiatric:        Mood and Affect: Mood normal.        Behavior: Behavior normal.        Thought Content: Thought content normal.        Judgment: Judgment normal.      No results found for any visits on 04/16/23.

## 2023-04-17 ENCOUNTER — Encounter: Payer: Self-pay | Admitting: Family Medicine

## 2023-04-17 LAB — COMPLETE METABOLIC PANEL WITH GFR
AG Ratio: 1.4 (calc) (ref 1.0–2.5)
ALT: 21 U/L (ref 6–29)
AST: 23 U/L (ref 10–35)
Albumin: 4.2 g/dL (ref 3.6–5.1)
Alkaline phosphatase (APISO): 88 U/L (ref 37–153)
BUN: 7 mg/dL (ref 7–25)
CO2: 29 mmol/L (ref 20–32)
Calcium: 9.8 mg/dL (ref 8.6–10.4)
Chloride: 104 mmol/L (ref 98–110)
Creat: 0.63 mg/dL (ref 0.50–1.05)
Globulin: 2.9 g/dL (ref 1.9–3.7)
Glucose, Bld: 104 mg/dL — ABNORMAL HIGH (ref 65–99)
Potassium: 3.9 mmol/L (ref 3.5–5.3)
Sodium: 141 mmol/L (ref 135–146)
Total Bilirubin: 0.4 mg/dL (ref 0.2–1.2)
Total Protein: 7.1 g/dL (ref 6.1–8.1)
eGFR: 101 mL/min/{1.73_m2} (ref >=60)

## 2023-05-13 ENCOUNTER — Encounter: Payer: Self-pay | Admitting: Internal Medicine

## 2023-05-13 ENCOUNTER — Telehealth: Payer: Self-pay

## 2023-05-13 DIAGNOSIS — Z1211 Encounter for screening for malignant neoplasm of colon: Secondary | ICD-10-CM

## 2023-05-13 NOTE — Telephone Encounter (Signed)
 Copied from CRM [id number]. Topic: Referral - Status >> May 13, 2023 10:17 AM Carlatta H wrote: Reason for CRM: Please call patient and advised of referral for GI

## 2023-05-16 ENCOUNTER — Encounter: Payer: Self-pay | Admitting: Family Medicine

## 2023-05-16 ENCOUNTER — Ambulatory Visit: Admitting: Family Medicine

## 2023-05-16 VITALS — BP 126/88 | HR 87 | Temp 98.7°F | Ht 64.0 in | Wt 237.5 lb

## 2023-05-16 DIAGNOSIS — E1165 Type 2 diabetes mellitus with hyperglycemia: Secondary | ICD-10-CM | POA: Diagnosis not present

## 2023-05-16 DIAGNOSIS — K760 Fatty (change of) liver, not elsewhere classified: Secondary | ICD-10-CM

## 2023-05-16 DIAGNOSIS — E785 Hyperlipidemia, unspecified: Secondary | ICD-10-CM

## 2023-05-16 DIAGNOSIS — E559 Vitamin D deficiency, unspecified: Secondary | ICD-10-CM

## 2023-05-16 DIAGNOSIS — Z79899 Other long term (current) drug therapy: Secondary | ICD-10-CM

## 2023-05-16 DIAGNOSIS — Z23 Encounter for immunization: Secondary | ICD-10-CM | POA: Insufficient documentation

## 2023-05-16 DIAGNOSIS — J301 Allergic rhinitis due to pollen: Secondary | ICD-10-CM

## 2023-05-16 NOTE — Progress Notes (Addendum)
 Patient Office Visit  Assessment & Plan:  Hyperlipidemia, unspecified hyperlipidemia type -     Lipid panel  Type 2 diabetes mellitus with hyperglycemia, without long-term current use of insulin (HCC) -     CBC with Differential/Platelet -     COMPLETE METABOLIC PANEL WITHOUT GFR -     Hemoglobin A1c -     TSH  Seasonal allergic rhinitis due to pollen  Vitamin D deficiency -     VITAMIN D 25 Hydroxy (Vit-D Deficiency, Fractures)  Fatty liver -     COMPLETE METABOLIC PANEL WITHOUT GFR  Taking a statin medication -     COMPLETE METABOLIC PANEL WITHOUT GFR  Need for shingles vaccine   We Did not have the Shingrix vaccine today she will get it next time.  Follow-up on lab work and notify patient.  Continue healthy diet and exercise as tolerated. Return in about 3 months (around 08/15/2023), or if symptoms worsen or fail to improve.   Subjective:    Patient ID: Jacqueline Adams, female    DOB: 03-15-1960  Age: 64 y.o. MRN: [id number]  Chief Complaint  Patient presents with   Medical Management of Chronic Issues    HPI Establish care and discuss chronic medical issues.  Tachycardia- pt taking Toprol XL which is helping. During chemotherapy her HR was typically over 100 most of the time.  Type 2 diabetes-previously controlled.  Denies diarrhea, peripheral swelling, hypoglycemia, excessive thirst, excessive urination, visual fluctuation, and fatigue.  Making an effort on diet control and exercise.  Has an up-to-date dilated eye exam.  Pt is not taking medication for this.   Patient's weight remains stable.  Patient does eat healthy for the most part and stays active.  Has not been able to work out since she had a dental procedure on Monday.  Patient will get back on track. Hyperlipidemia-denies unusual muscle aches or muscle cramps or difficulty tolerating statin therapy.  Aware of need for diet control, exercise and healthy eating.  Pt taking zocor Fatty liver-this was noted in  the past.  Patient has low-fat diet helps with this as well as weight loss. CT done last March showed this.  Hepatobiliary: No cholelithiasis or biliary dilatation is noted.  Hepatic steatosis.  Left sided dental procedure this past Monday (1.5 hour).  Patient has been taking ibuprofen 800 mg which is helping.  Her left side of her face is not as swollen compared to Monday.  She does have a follow-up with dentist in a couple weeks Triple negative Breast CA 2021- pt is BRCA positive-patient alternates between mammogram and MRI every 6 months.  Pt did chemotherapy and radiation therapy. Pt is currently in remission. Pt sees Dr. Celso Sickle in [place]. Pt now seeing oncology once per year.  MS- pt seeing Dr. Epimenio Foot every 6 months. She has done well with the Avonex.  Health maintenance-patient is due for colonoscopy in June.  Patient has up-to-date mammogram and will do an MRI in August.  Patient is up-to-date on tetanus shot.  Patient does need Shingrix. The 10-year ASCVD risk score (Arnett DK, et al., 2019) is: 12.5%  Past Medical History:  Diagnosis Date   Allergy 1979   Shellfish alergy   Anemia    BRCA2 gene mutation positive in female    Breast cancer (HCC) 01/2020   right breast IDC   Bronchitis    Cervical spondylarthritis    Headache(784.0)    High cholesterol    Hypertension  Leukocytosis 2009   Multiple sclerosis (HCC) 2009   followed by Dr. Sandria Manly   Neuromuscular disorder [facility])    Personal history of chemotherapy    Personal history of radiation therapy    Pneumonia    Rhinitis    Seasonal allergies    Sinusitis    Past Surgical History:  Procedure Laterality Date   ABDOMINAL HYSTERECTOMY  2006   uterus, cervix, portion of fallopian tubes for AUB   BREAST BIOPSY Left 10/10/2021   BREAST BIOPSY Right 02/02/2020   BREAST BIOPSY Right 01/28/2020   BREAST BIOPSY Right 12/31/2019   times 2   BREAST LUMPECTOMY Right 08/08/2020   BREAST LUMPECTOMY WITH RADIOACTIVE SEED AND SENTINEL  LYMPH NODE BIOPSY Right 08/08/2020   Procedure: RIGHT BREAST LUMPECTOMY WITH RADIOACTIVE SEED X 3 AND SENTINEL LYMPH NODE BIOPSY;  Surgeon: Almond Lint, MD;  [facility];  Service: General;  Laterality: Right;   CESAREAN SECTION     x 3   CORRECTION HAMMER TOE Bilateral    PORT-A-CATH REMOVAL Right 09/21/2020   Procedure: REMOVAL PORT-A-CATH;  Surgeon: Almond Lint, MD;  [facility];  Service: General;  Laterality: Right;   PORTACATH PLACEMENT Right 02/22/2020   Procedure: RIGHT SUBCLAVIAN VEIN PORT PLACEMENT;  Surgeon: Manus Rudd, MD;  Location: [facility];  Service: General;  Laterality: Right;   ROBOTIC ASSISTED BILATERAL SALPINGO OOPHERECTOMY Bilateral 01/30/2021   Procedure: XI ROBOTIC ASSISTED BILATERAL SALPINGO OOPHORECTOMY, LYSIS OF ADHESIONS, 1 HOUR;  Surgeon: Carver Fila, MD;  [facility];  Service: Gynecology;  Laterality: Bilateral;   TUBAL LIGATION  08/04/02   Social History   Tobacco Use   Smoking status: Never   Smokeless tobacco: Never  Vaping Use   Vaping status: Never Used  Substance Use Topics   Alcohol use: Never   Drug use: Never   Family History  Problem Relation Age of Onset   Cancer Mother 76       ovarian cancer; now with suspected breast cancer   Diabetes Mother        also HTN   Breast cancer Mother    Clotting disorder Father    Prostate cancer Father    Colon cancer Sister 61       anal cancer (2014)   Cancer Sister    Lung cancer Sister 51   Cancer Sister    Stroke Sister 45   Ovarian cancer Sister 70   Hypertension Brother    Hypertension Brother    Cancer Maternal Grandmother        unknown female reproductive organ cancer   Breast cancer Maternal Aunt    Cancer Maternal Aunt    Breast cancer Maternal Aunt    Cancer Maternal Aunt    Breast cancer Maternal Aunt    Endometrial cancer Niece    Pancreatic cancer Neg Hx    Allergies  Allergen Reactions   Nabumetone Swelling and Hives   Procaine  Swelling   Shellfish Allergy Anaphylaxis    ROS    Objective:    BP 126/88   Pulse 87   Temp 98.7 F (37.1 C)   Ht 5\' 4"  (1.626 m)   Wt 237 lb 8 oz (107.7 kg)   SpO2 98%   BMI 40.77 kg/m  BP Readings from Last 3 Encounters:  05/16/23 126/88  04/16/23 124/82  03/17/23 139/88   Wt Readings from Last 3 Encounters:  05/16/23 237 lb 8 oz (107.7 kg)  04/16/23 238 lb 4 oz (  108.1 kg)  03/17/23 235 lb (106.6 kg)    Physical Exam Vitals and nursing note reviewed.  Constitutional:      Appearance: Normal appearance.  HENT:     Head: Normocephalic.     Right Ear: Tympanic membrane, ear canal and external ear normal.     Left Ear: Tympanic membrane, ear canal and external ear normal.  Eyes:     Extraocular Movements: Extraocular movements intact.     Conjunctiva/sclera: Conjunctivae normal.     Pupils: Pupils are equal, round, and reactive to light.  Cardiovascular:     Rate and Rhythm: Normal rate and regular rhythm.     Heart sounds: Normal heart sounds.  Pulmonary:     Effort: Pulmonary effort is normal.     Breath sounds: Normal breath sounds.  Musculoskeletal:     Right lower leg: No edema.     Left lower leg: No edema.  Neurological:     General: No focal deficit present.     Mental Status: She is alert and oriented to person, place, and time.  Psychiatric:        Mood and Affect: Mood normal.        Behavior: Behavior normal.        Thought Content: Thought content normal.        Judgment: Judgment normal.      No results found for any visits on 05/16/23.

## 2023-05-17 LAB — CBC WITH DIFFERENTIAL/PLATELET
Absolute Lymphocytes: 2631 {cells}/uL (ref 850–3900)
Absolute Monocytes: 642 {cells}/uL (ref 200–950)
Basophils Absolute: 53 {cells}/uL (ref 0–200)
Basophils Relative: 0.6 %
Eosinophils Absolute: 343 {cells}/uL (ref 15–500)
Eosinophils Relative: 3.9 %
HCT: 39.7 % (ref 35.0–45.0)
Hemoglobin: 12.4 g/dL (ref 11.7–15.5)
MCH: 25.4 pg — ABNORMAL LOW (ref 27.0–33.0)
MCHC: 31.2 g/dL — ABNORMAL LOW (ref 32.0–36.0)
MCV: 81.4 fL (ref 80.0–100.0)
MPV: 10.4 fL (ref 7.5–12.5)
Monocytes Relative: 7.3 %
Neutro Abs: 5130 {cells}/uL (ref 1500–7800)
Neutrophils Relative %: 58.3 %
Platelets: 293 10*3/uL (ref 140–400)
RBC: 4.88 10*6/uL (ref 3.80–5.10)
RDW: 15.2 % — ABNORMAL HIGH (ref 11.0–15.0)
Total Lymphocyte: 29.9 %
WBC: 8.8 10*3/uL (ref 3.8–10.8)

## 2023-05-17 LAB — COMPLETE METABOLIC PANEL WITHOUT GFR
AG Ratio: 1.6 (calc) (ref 1.0–2.5)
ALT: 24 U/L (ref 6–29)
AST: 31 U/L (ref 10–35)
Albumin: 4.2 g/dL (ref 3.6–5.1)
Alkaline phosphatase (APISO): 77 U/L (ref 37–153)
BUN: 8 mg/dL (ref 7–25)
CO2: 29 mmol/L (ref 20–32)
Calcium: 9.3 mg/dL (ref 8.6–10.4)
Chloride: 104 mmol/L (ref 98–110)
Creat: 0.57 mg/dL (ref 0.50–1.05)
Globulin: 2.7 g/dL (ref 1.9–3.7)
Glucose, Bld: 95 mg/dL (ref 65–99)
Potassium: 4 mmol/L (ref 3.5–5.3)
Sodium: 142 mmol/L (ref 135–146)
Total Bilirubin: 0.4 mg/dL (ref 0.2–1.2)
Total Protein: 6.9 g/dL (ref 6.1–8.1)

## 2023-05-17 LAB — TSH: TSH: 1.29 m[IU]/L (ref 0.40–4.50)

## 2023-05-17 LAB — LIPID PANEL
Cholesterol: 178 mg/dL (ref <200)
HDL: 49 mg/dL — ABNORMAL LOW (ref >=50)
LDL Cholesterol (Calc): 109 mg/dL — ABNORMAL HIGH
Non-HDL Cholesterol (Calc): 129 mg/dL (ref <130)
Total CHOL/HDL Ratio: 3.6 (calc) (ref <5.0)
Triglycerides: 103 mg/dL (ref <150)

## 2023-05-17 LAB — HEMOGLOBIN A1C
Hgb A1c MFr Bld: 7 %{Hb} — ABNORMAL HIGH (ref <5.7)
Mean Plasma Glucose: 154 mg/dL
eAG (mmol/L): 8.5 mmol/L

## 2023-05-17 LAB — VITAMIN D 25 HYDROXY (VIT D DEFICIENCY, FRACTURES): Vit D, 25-Hydroxy: 59 ng/mL (ref 30–100)

## 2023-05-19 ENCOUNTER — Ambulatory Visit: Payer: 59 | Admitting: Family Medicine

## 2023-05-19 ENCOUNTER — Encounter: Payer: Self-pay | Admitting: Family Medicine

## 2023-05-29 ENCOUNTER — Ambulatory Visit: Payer: Self-pay | Admitting: Family Medicine

## 2023-05-29 ENCOUNTER — Other Ambulatory Visit: Payer: Self-pay | Admitting: Hematology and Oncology

## 2023-05-29 NOTE — Telephone Encounter (Addendum)
 Copied from CRM [id number]. Topic: Clinical - Red Word Triage >> May 29, 2023  9:02 AM Rennis Case wrote: Red Word that prompted transfer to Nurse Triage: Ear pain, possible sinus infection, congestion   Chief Complaint: sinus congestion Symptoms: ear pain and fever Frequency: constant Pertinent Negatives: Patient denies shortness of breath Disposition: [] ED /[] Urgent Care (no appt availability in office) / [x] Appointment(In office/virtual)/ []  [facility]/ [] Home Care/ [] Refused Recommended Disposition /[] [facility]/ []  Follow-up with PCP Additional Notes: Patient reports sinus congestion that started a couple of days ago and ear pain that started this morning. No avail appts this week, RN advising UC but pt prefers to see her provider this week if possible. Will route to clinic for f/u. Pt states that she will go to UC if she does not hear anything back from clinic this afternoon Reason for Disposition  Earache  Answer Assessment - Initial Assessment Questions 1. LOCATION: "Where does it hurt?"      Ear and jaw on left side  2. ONSET: "When did the sinus pain start?"  (e.g., hours, days)      Started a couple of days ago  3. SEVERITY: "How bad is the pain?"   (Scale 1-10; mild, moderate or severe)   - MILD (1-3): doesn't interfere with normal activities    - MODERATE (4-7): interferes with normal activities (e.g., work or school) or awakens from sleep   - SEVERE (8-10): excruciating pain and patient unable to do any normal activities        7-8/10 pain   4. RECURRENT SYMPTOM: "Have you ever had sinus problems before?" If Yes, ask: "When was the last time?" and "What happened that time?"      Yes, has frequent sinus infection, usually gets abx  5. NASAL CONGESTION: "Is the nose blocked?" If Yes, ask: "Can you open it or must you breathe through your mouth?"     Yes  6. NASAL DISCHARGE: "Do you have discharge from your nose?" If so ask, "What color?"     No  discharge  7. FEVER: "Do you have a fever?" If Yes, ask: "What is it, how was it measured, and when did it start?"      Low grade fever, 101F  8. OTHER SYMPTOMS: "Do you have any other symptoms?" (e.g., sore throat, cough, earache, difficulty breathing)     Throat drainage, nausea  9. PREGNANCY: "Is there any chance you are pregnant?" "When was your last menstrual period?"     .  Protocols used: Sinus Pain or Congestion-A-AH

## 2023-07-14 ENCOUNTER — Ambulatory Visit: Payer: Self-pay | Attending: Hematology and Oncology

## 2023-07-14 ENCOUNTER — Ambulatory Visit (AMBULATORY_SURGERY_CENTER)

## 2023-07-14 VITALS — Wt 237.1 lb

## 2023-07-14 VITALS — Ht 64.25 in | Wt 210.0 lb

## 2023-07-14 DIAGNOSIS — Z483 Aftercare following surgery for neoplasm: Secondary | ICD-10-CM | POA: Insufficient documentation

## 2023-07-14 DIAGNOSIS — Z1211 Encounter for screening for malignant neoplasm of colon: Secondary | ICD-10-CM

## 2023-07-14 MED ORDER — NA SULFATE-K SULFATE-MG SULF 17.5-3.13-1.6 GM/177ML PO SOLN: 1.0000 | Freq: Once | ORAL | 0 refills | Status: AC

## 2023-07-14 NOTE — Progress Notes (Signed)

## 2023-07-14 NOTE — Therapy (Signed)
 OUTPATIENT PHYSICAL THERAPY SOZO SCREENING NOTE   Patient Name: KATALEENA HOLSAPPLE MRN: [id number] DOB:03-28-1960, 63 y.o., female Today's Date: 07/14/2023  PCP: Amadeo June, MD REFERRING PROVIDER: Cameron Cea, MD   PT End of Session - 07/14/23 0945     Visit Number 1   # unchanged due to screen only   PT Start Time 0941    PT Stop Time 0945    PT Time Calculation (min) 4 min    Activity Tolerance Patient tolerated treatment well    Behavior During Therapy [facility] for tasks assessed/performed             Past Medical History:  Diagnosis Date   Allergy  1979   Shellfish alergy   Anemia    BRCA2 gene mutation positive in female    Breast cancer (HCC) 01/2020   right breast IDC   Bronchitis    Cervical spondylarthritis    Headache(784.0)    High cholesterol    Hypertension    Leukocytosis 2009   Multiple sclerosis (HCC) 2009   followed by Dr. Dania Dupre   Neuromuscular disorder [facility])    Personal history of chemotherapy    Personal history of radiation therapy    Pneumonia    Rhinitis    Seasonal allergies    Sinusitis    Past Surgical History:  Procedure Laterality Date   ABDOMINAL HYSTERECTOMY  2006   uterus, cervix, portion of fallopian tubes for AUB   BREAST BIOPSY Left 10/10/2021   BREAST BIOPSY Right 02/02/2020   BREAST BIOPSY Right 01/28/2020   BREAST BIOPSY Right 12/31/2019   times 2   BREAST LUMPECTOMY Right 08/08/2020   BREAST LUMPECTOMY WITH RADIOACTIVE SEED AND SENTINEL LYMPH NODE BIOPSY Right 08/08/2020   Procedure: RIGHT BREAST LUMPECTOMY WITH RADIOACTIVE SEED X 3 AND SENTINEL LYMPH NODE BIOPSY;  Surgeon: Lockie Rima, MD;  [facility];  Service: General;  Laterality: Right;   CESAREAN SECTION     x 3   CORRECTION HAMMER TOE Bilateral    PORT-A-CATH REMOVAL Right 09/21/2020   Procedure: REMOVAL PORT-A-CATH;  Surgeon: Lockie Rima, MD;  [facility];  Service: General;  Laterality: Right;   PORTACATH PLACEMENT Right 02/22/2020    Procedure: RIGHT SUBCLAVIAN VEIN PORT PLACEMENT;  Surgeon: Dareen Ebbing, MD;  Location: [facility];  Service: General;  Laterality: Right;   ROBOTIC ASSISTED BILATERAL SALPINGO OOPHERECTOMY Bilateral 01/30/2021   Procedure: XI ROBOTIC ASSISTED BILATERAL SALPINGO OOPHORECTOMY, LYSIS OF ADHESIONS, 1 HOUR;  Surgeon: Suzi Essex, MD;  [facility];  Service: Gynecology;  Laterality: Bilateral;   TUBAL LIGATION  08/04/02   Patient Active Problem List   Diagnosis Date Noted   Need for shingles vaccine 05/16/2023   Fatty liver 04/16/2023   Morbid obesity with body mass index (BMI) of 40.0 to 44.9 in adult [facility]) 02/03/2023   Tachycardia 07/26/2022   Type 2 diabetes mellitus with hyperglycemia, without long-term current use of insulin (HCC) 07/26/2022   Abnormality of gait due to impairment of balance 06/07/2022   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/07/2022   Bilateral ovarian cysts    Malignant neoplasm of upper-outer quadrant of right breast in female, estrogen receptor negative (HCC) 01/10/2020   Taking a statin medication 03/25/2019   Microcytic anemia 12/31/2017   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 UACS 12/16/2017   Seasonal allergic rhinitis due to pollen 07/07/2015   Low back pain with sciatica 11/16/2014   HLD (hyperlipidemia) 02/14/2014   Cervical spondylosis without myelopathy 07/28/2013   CFIDS (chronic  fatigue and immune dysfunction syndrome) (HCC) 07/28/2013   Headache, migraine 07/28/2013   DS (disseminated sclerosis) (HCC) 07/28/2013   DDD (degenerative disc disease), cervical 07/28/2013   Menopausal symptom 07/28/2013   Taking drug for chronic disease 07/28/2013   Vitamin D  deficiency 07/28/2013   Degeneration of intervertebral disc of cervical region 07/28/2013   BRCA gene mutation positive in female 06/12/2011   Genetic susceptibility to malignant neoplasm of breast 06/12/2011   Multiple sclerosis (HCC)     REFERRING DIAG: right breast cancer at risk for  lymphedema  THERAPY DIAG: Aftercare following surgery for neoplasm  PERTINENT HISTORY: Patient was diagnosed on 12/23/2019 with 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 She underwent neoadjuvant chemotherapy 02/23/2020 - 07/05/2020 followed by a right lumpectomy and sentinel node biopsy (3 negative nodes) on 08/08/2020. Completed radiation.  She has relapsing remitting multiple sclerosis since 2009  PRECAUTIONS: right UE Lymphedema risk, None  SUBJECTIVE: Pt returns for her 6 month L-Dex screen.   PAIN:  Are you having pain? No  SOZO SCREENING: Patient was assessed today using the SOZO machine to determine the lymphedema index score. This was compared to her baseline score. It was determined that she is within the recommended range when compared to her baseline and no further action is needed at this time. She will continue SOZO screenings. These are done every 3 months for 2 years post operatively followed by every 6 months for 2 years, and then annually.   L-DEX FLOWSHEETS - 07/14/23 0900       L-DEX LYMPHEDEMA SCREENING   Measurement Type Unilateral    L-DEX MEASUREMENT EXTREMITY Upper Extremity    POSITION  Standing    DOMINANT SIDE Right    At Risk Side Right    BASELINE SCORE (UNILATERAL) -0.3    L-DEX SCORE (UNILATERAL) 0.4    VALUE CHANGE (UNILAT) 0.7             P: Cont 6 month L-Dex screens next.   Denyce Flank, PTA 07/14/2023, 9:46 AM

## 2023-07-16 ENCOUNTER — Encounter: Payer: Self-pay | Admitting: Internal Medicine

## 2023-07-29 ENCOUNTER — Telehealth: Payer: Self-pay | Admitting: Internal Medicine

## 2023-07-29 NOTE — Telephone Encounter (Signed)
Patient needing to be further advised on prep.

## 2023-07-29 NOTE — Telephone Encounter (Signed)
 Pt wanted to review what she was to do about her Iron med. Reviewed date she is to stop it but pt states she stopped in on the 13th. No other questions at this time.

## 2023-08-04 ENCOUNTER — Ambulatory Visit (AMBULATORY_SURGERY_CENTER): Admitting: Internal Medicine

## 2023-08-04 ENCOUNTER — Encounter: Payer: Self-pay | Admitting: Internal Medicine

## 2023-08-04 VITALS — BP 112/61 | HR 91 | Temp 97.5°F | Resp 19 | Ht 64.25 in | Wt 237.0 lb

## 2023-08-04 DIAGNOSIS — Z1211 Encounter for screening for malignant neoplasm of colon: Secondary | ICD-10-CM | POA: Diagnosis present

## 2023-08-04 DIAGNOSIS — K56609 Unspecified intestinal obstruction, unspecified as to partial versus complete obstruction: Secondary | ICD-10-CM

## 2023-08-04 DIAGNOSIS — K5730 Diverticulosis of large intestine without perforation or abscess without bleeding: Secondary | ICD-10-CM | POA: Diagnosis not present

## 2023-08-04 MED ORDER — SODIUM CHLORIDE 0.9 % IV SOLN: 500.0000 mL | Freq: Once | INTRAVENOUS | Status: DC

## 2023-08-04 NOTE — Op Note (Signed)
 [facility] Patient Name: Jacqueline Adams Procedure Date: 08/04/2023 8:08 AM MRN: [id number] Endoscopist: Norleen SAILOR. Abran , MD, [id number] Age: 63 Referring MD:  Date of Birth: [birth year] Gender: Female Account #: [id number] Procedure:                Colonoscopy Indications:              Screening for colorectal malignant neoplasm.                            Negative index exam 2015 Medicines:                Monitored Anesthesia Care Procedure:                Pre-Anesthesia Assessment:                           - Prior to the procedure, a History and Physical                            was performed, and patient medications and                            allergies were reviewed. The patient's tolerance of                            previous anesthesia was also reviewed. The risks                            and benefits of the procedure and the sedation                            options and risks were discussed with the patient.                            All questions were answered, and informed consent                            was obtained. Prior Anticoagulants: The patient has                            taken no anticoagulant or antiplatelet agents. ASA                            Grade Assessment: II - A patient with mild systemic                            disease. After reviewing the risks and benefits,                            the patient was deemed in satisfactory condition to                            undergo the procedure.  After obtaining informed consent, the colonoscope                            was passed under direct vision. Throughout the                            procedure, the patient's blood pressure, pulse, and                            oxygen saturations were monitored continuously. The                            Olympus Scope SN: [id number] was introduced through                            the anus and advanced to the the  cecum, identified                            by appendiceal orifice and ileocecal valve. The                            ileocecal valve, appendiceal orifice, and rectum                            were photographed. The quality of the bowel                            preparation was excellent. The colonoscopy was                            performed without difficulty. The patient tolerated                            the procedure well. The bowel preparation used was                            SUPREP via split dose instruction. Scope In: 8:23:36 AM Scope Out: 8:44:04 AM Scope Withdrawal Time: 0 hours 7 minutes 39 seconds  Total Procedure Duration: 0 hours 20 minutes 28 seconds  Findings:                 Multiple diverticula were found in the sigmoid                            colon and ascending colon. There was marked sigmoid                            stenosis                           The exam was otherwise without abnormality on                            direct and retroflexion views. Complications:  No immediate complications. Estimated blood loss:                            None. Estimated Blood Loss:     Estimated blood loss: none. Impression:               - Diverticulosis in the sigmoid colon and in the                            ascending colon. Marked sigmoid stenosis.                           - The examination was otherwise normal on direct                            and retroflexion views.                           - No specimens collected. Recommendation:           - Repeat colonoscopy in 10 years for screening                            purposes.                           - Patient has a contact number available for                            emergencies. The signs and symptoms of potential                            delayed complications were discussed with the                            patient. Return to normal activities tomorrow.                             Written discharge instructions were provided to the                            patient.                           - Resume previous diet.                           - Continue present medications. Norleen SAILOR. Abran, MD 08/04/2023 8:52:31 AM This report has been signed electronically.

## 2023-08-04 NOTE — Progress Notes (Signed)
 VS by DT  Pt's states no medical or surgical changes since previsit or office visit.

## 2023-08-04 NOTE — Progress Notes (Signed)
 HISTORY OF PRESENT ILLNESS:  Jacqueline Adams is a 63 y.o. female who presents for follow-up screening colonoscopy.  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 Maurice   Neuromuscular disorder [facility])    Personal history of chemotherapy    Personal history of radiation therapy    Pneumonia    Rhinitis    Seasonal allergies    Sinusitis     Past Surgical History:  Procedure Laterality Date   ABDOMINAL HYSTERECTOMY  2006   uterus, cervix, portion of fallopian tubes for AUB   BREAST BIOPSY Left 10/10/2021   BREAST BIOPSY Right 02/02/2020   BREAST BIOPSY Right 01/28/2020   BREAST BIOPSY Right 12/31/2019   times 2   BREAST LUMPECTOMY Right 08/08/2020   BREAST LUMPECTOMY WITH RADIOACTIVE SEED AND SENTINEL LYMPH NODE BIOPSY Right 08/08/2020   Procedure: RIGHT BREAST LUMPECTOMY WITH RADIOACTIVE SEED X 3 AND SENTINEL LYMPH NODE BIOPSY;  Surgeon: Aron Shoulders, MD;  Location: [facility] OR;  Service: General;  Laterality: Right;   CESAREAN SECTION     x 3   CORRECTION HAMMER TOE Bilateral    PORT-A-CATH REMOVAL Right 09/21/2020   Procedure: REMOVAL PORT-A-CATH;  Surgeon: Aron Shoulders, MD;  Location: [facility] OR;  Service: General;  Laterality: Right;   PORTACATH PLACEMENT Right 02/22/2020   Procedure: RIGHT SUBCLAVIAN VEIN PORT PLACEMENT;  Surgeon: Belinda Cough, MD;  Location: [facility];  Service: General;  Laterality: Right;   ROBOTIC ASSISTED BILATERAL SALPINGO OOPHERECTOMY Bilateral 01/30/2021   Procedure: XI ROBOTIC ASSISTED BILATERAL SALPINGO OOPHORECTOMY, LYSIS OF ADHESIONS,  1 HOUR;  Surgeon: Viktoria Comer SAUNDERS, MD;  Location: [facility];  Service: Gynecology;  Laterality: Bilateral;   TUBAL LIGATION  08/04/02    Social History GARLAND SMOUSE  reports that she has never smoked. She has never used smokeless tobacco. She reports that she does not drink alcohol and does not use drugs.  family history includes Breast cancer in her maternal aunt, maternal aunt, maternal aunt, and mother; Cancer in her maternal aunt, maternal aunt, maternal grandmother, sister, and sister; Cancer (age of onset: 34) in her mother; Clotting disorder in her father; Colon cancer in her mother; Colon cancer (age of onset: 50) in her sister; Colon polyps in her mother and sister; Diabetes in her mother; Endometrial cancer in her niece; Esophageal cancer in her sister; Hypertension in her brother and brother; Lung cancer (age of onset: 29) in her sister; Ovarian cancer (age of onset: 63) in her sister; Prostate cancer in her father; Rectal cancer in her sister; Stomach cancer in her maternal grandmother; Stroke (age of onset: 80) in her sister.  Allergies  Allergen Reactions   Nabumetone Swelling and Hives   Procaine Swelling   Shellfish Allergy  Anaphylaxis       PHYSICAL EXAMINATION: Vital signs: BP (!) 173/66   Pulse 89   Temp (!) 97.5 F (36.4 C)   Resp (!) 21   Ht 5' 4.25 (1.632 m)   Wt 237 lb (107.5 kg)  SpO2 91%   BMI 40.36 kg/m  General: Well-developed, well-nourished, no acute distress HEENT: Sclerae are anicteric, conjunctiva pink. Oral mucosa intact Lungs: Clear Heart: Regular Abdomen: soft, nontender, nondistended, no obvious ascites, no peritoneal signs, normal bowel sounds. No organomegaly. Extremities: No edema Psychiatric: alert and oriented x3. Cooperative     ASSESSMENT:  Colon cancer screening   PLAN:   Screening colonoscopy

## 2023-08-04 NOTE — Progress Notes (Signed)
 Transferred to PACU via stretcher, arousing, VSS.

## 2023-08-04 NOTE — Patient Instructions (Addendum)
 Repeat colonoscopy in 10 years for screening purposes. Continue your normal medications Please read over handout                             YOU HAD AN ENDOSCOPIC PROCEDURE TODAY AT THE  ENDOSCOPY CENTER:   Refer to the procedure report that was given to you for any specific questions about what was found during the examination.  If the procedure report does not answer your questions, please call your gastroenterologist to clarify.  If you requested that your care partner not be given the details of your procedure findings, then the procedure report has been included in a sealed envelope for you to review at your convenience later.  YOU SHOULD EXPECT: Some feelings of bloating in the abdomen. Passage of more gas than usual.  Walking can help get rid of the air that was put into your GI tract during the procedure and reduce the bloating. If you had a lower endoscopy (such as a colonoscopy or flexible sigmoidoscopy) you may notice spotting of blood in your stool or on the toilet paper. If you underwent a bowel prep for your procedure, you may not have a normal bowel movement for a few days.  Please Note:  You might notice some irritation and congestion in your nose or some drainage.  This is from the oxygen used during your procedure.  There is no need for concern and it should clear up in a day or so.  SYMPTOMS TO REPORT IMMEDIATELY:  Following lower endoscopy (colonoscopy or flexible sigmoidoscopy):  Excessive amounts of blood in the stool  Significant tenderness or worsening of abdominal pains  Swelling of the abdomen that is new, acute  Fever of 100F or higher  For urgent or emergent issues, a gastroenterologist can be reached at any hour by calling [phone]. Do not use MyChart messaging for urgent concerns.    DIET:  We do recommend a small meal at first, but then you may proceed to your regular diet.  Drink plenty of fluids but you should avoid alcoholic beverages for 24  hours.  ACTIVITY:  You should plan to take it easy for the rest of today and you should NOT DRIVE or use heavy machinery until tomorrow (because of the sedation medicines used during the test).    FOLLOW UP: Our staff will call the number listed on your records the next business day following your procedure.  We will call around 7:15- 8:00 am to check on you and address any questions or concerns that you may have regarding the information given to you following your procedure. If we do not reach you, we will leave a message.     SIGNATURES/CONFIDENTIALITY: You and/or your care partner have signed paperwork which will be entered into your electronic medical record.  These signatures attest to the fact that that the information above on your After Visit Summary has been reviewed and is understood.  Full responsibility of the confidentiality of this discharge information lies with you and/or your care-partner.

## 2023-08-05 ENCOUNTER — Telehealth: Payer: Self-pay | Admitting: *Deleted

## 2023-08-05 NOTE — Telephone Encounter (Signed)
  Follow up Call-     08/04/2023    7:37 AM  Call back number  Post procedure Call Back phone  # [phone]  Permission to leave phone message Yes     Patient questions:  Do you have a fever, pain , or abdominal swelling? No. Pain Score  0 *  Have you tolerated food without any problems? Yes.    Have you been able to return to your normal activities? Yes.    Do you have any questions about your discharge instructions: Diet   No. Medications  No. Follow up visit  No.  Do you have questions or concerns about your Care? No.  Actions: * If pain score is 4 or above: No action needed, pain <4.

## 2023-08-18 ENCOUNTER — Ambulatory Visit: Admitting: Family Medicine

## 2023-08-18 ENCOUNTER — Encounter: Payer: Self-pay | Admitting: Family Medicine

## 2023-08-18 VITALS — BP 130/82 | HR 94 | Ht 64.25 in | Wt 233.5 lb

## 2023-08-18 DIAGNOSIS — E785 Hyperlipidemia, unspecified: Secondary | ICD-10-CM | POA: Diagnosis not present

## 2023-08-18 DIAGNOSIS — E1165 Type 2 diabetes mellitus with hyperglycemia: Secondary | ICD-10-CM

## 2023-08-18 DIAGNOSIS — R000 Tachycardia, unspecified: Secondary | ICD-10-CM

## 2023-08-18 DIAGNOSIS — Z23 Encounter for immunization: Secondary | ICD-10-CM | POA: Diagnosis not present

## 2023-08-18 DIAGNOSIS — Z79899 Other long term (current) drug therapy: Secondary | ICD-10-CM

## 2023-08-18 MED ORDER — SIMVASTATIN 20 MG PO TABS: 20.0000 mg | ORAL_TABLET | Freq: Every evening | ORAL | 1 refills | Status: DC

## 2023-08-18 NOTE — Progress Notes (Signed)
 Patient Office Visit  Assessment & Plan:  Type 2 diabetes mellitus with hyperglycemia, without long-term current use of insulin (HCC) -     Hemoglobin A1c  Taking a statin medication -     CBC with Differential/Platelet -     Comprehensive metabolic panel with GFR  Hyperlipidemia, unspecified hyperlipidemia type -     Simvastatin ; Take 1 tablet (20 mg total) by mouth every evening.  Dispense: 90 tablet; Refill: 1 -     Lipid panel  Need for shingles vaccine -     Varicella-zoster vaccine IM  Tachycardia   Assessment and Plan    Type 2 Diabetes Mellitus A1c previously 7. Managing with diet and exercise. Discussed potential use of Ozempic or Mounjaro if A1c remains elevated. Explained benefits and addressed concerns about injections. Insurance coverage uncertain. - Monitor blood glucose levels. - Consider medication if A1c remains elevated.  Multiple Sclerosis Well-managed on current treatment since 2009. No new lesions or symptoms. Discussed potential future discontinuation if stability continues. - Continue current MS treatment regimen.  Diverticulosis Identified during recent colonoscopy. No family history of colon cancer. 10-year follow-up interval appropriate. - Schedule next colonoscopy in 10 years.  General Health Maintenance Up to date with mammograms and eye exams. Explained shingles vaccine is not live and recommended despite MS. Significant reaction to second COVID vaccine noted. - Administer shingles vaccine. - Continue alternating mammogram and MRI screenings. - Schedule eye exam every 6 months.  Follow-up Plans to reassess condition and management in three months. - Schedule follow-up appointment in 3 months.      Return in about 3 months (around 11/18/2023), or if symptoms worsen or fail to improve.   Subjective:    Patient ID: Jacqueline Adams, female    DOB: [birth year]  Age: 63 y.o. MRN: [id number]  No chief complaint on file.   HPI Discussed  the use of AI scribe software for clinical note transcription with the patient, who gave verbal consent to proceed.  History of Present Illness   Jacqueline Adams is a 63 year old female who presents for a routine follow-up.  She underwent a colonoscopy at [facility], which revealed diverticulosis. There is no family history of colon cancer, although her mother had ovarian cancer that spread to the colon.  She has been managing her weight through dietary changes and increased physical activity, utilizing a gym at work on the days she is in the office. She attributes her recent weight loss to these lifestyle changes.  Her last mammogram was in February 2025, and she is scheduled for an MRI in August 2025 as part of her alternating screening schedule. She sees her breast specialist annually, having graduated from six-month visits.  She has a history of multiple sclerosis and has been stable on her current treatment regimen since 2009. She receives weekly injections at work and reports no new lesions or symptoms.  She experienced a significant reaction to the second COVID-19 vaccine dose, resulting in fever and illness. She contracted COVID-19 in November 2022 despite taking precautions at a family gathering.  Her A1c was previously recorded at 7, and she is attempting to manage her blood sugar through diet and exercise. She does not regularly check her blood sugar levels.  She continues to take metoprolol  for heart rate palpitations, which have calmed down since her chemotherapy treatment. No current heart palpitations or racing heart.       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 Maurice   Neuromuscular disorder [facility])    Personal history of chemotherapy    Personal history of radiation therapy    Pneumonia    Rhinitis    Seasonal allergies    Sinusitis    Past Surgical History:  Procedure Laterality Date   ABDOMINAL HYSTERECTOMY  2006   uterus, cervix, portion of fallopian tubes for AUB   BREAST BIOPSY Left 10/10/2021   BREAST BIOPSY Right 02/02/2020   BREAST BIOPSY Right 01/28/2020   BREAST BIOPSY Right 12/31/2019   times 2   BREAST LUMPECTOMY Right 08/08/2020   BREAST LUMPECTOMY WITH RADIOACTIVE SEED AND SENTINEL LYMPH NODE BIOPSY Right 08/08/2020   Procedure: RIGHT BREAST LUMPECTOMY WITH RADIOACTIVE SEED X 3 AND SENTINEL LYMPH NODE BIOPSY;  Surgeon: Aron Shoulders, MD;  Location: [facility];  Service: General;  Laterality: Right;   CESAREAN SECTION     x 3   CORRECTION HAMMER TOE Bilateral    PORT-A-CATH REMOVAL Right 09/21/2020   Procedure: REMOVAL PORT-A-CATH;  Surgeon: Aron Shoulders, MD;  Location: [facility];  Service: General;  Laterality: Right;   PORTACATH PLACEMENT Right 02/22/2020   Procedure: RIGHT SUBCLAVIAN VEIN PORT PLACEMENT;  Surgeon: Belinda Cough, MD;  Location: [facility];  Service: General;  Laterality: Right;   ROBOTIC ASSISTED BILATERAL SALPINGO OOPHERECTOMY Bilateral 01/30/2021   Procedure: XI ROBOTIC ASSISTED BILATERAL SALPINGO OOPHORECTOMY, LYSIS OF ADHESIONS, 1 HOUR;  Surgeon: Viktoria Comer SAUNDERS, MD;  Location: [facility];  Service: Gynecology;  Laterality: Bilateral;   TUBAL LIGATION  08/04/02   Social History   Tobacco Use   Smoking status: Never   Smokeless tobacco: Never  Vaping Use   Vaping status: Never Used  Substance Use Topics   Alcohol use: Never   Drug use: Never   Family History  Problem Relation Age of Onset   Colon polyps Mother    Colon cancer Mother    Cancer Mother 91       ovarian cancer; now with suspected breast cancer   Diabetes  Mother        also HTN   Breast cancer Mother    Clotting disorder Father    Prostate cancer Father    Colon polyps Sister    Colon cancer Sister 70       anal cancer (2014)   Cancer Sister    Rectal cancer Sister    Lung cancer Sister 60   Cancer Sister    Esophageal cancer Sister    Stroke Sister 50   Ovarian cancer Sister 78   Hypertension Brother    Hypertension Brother    Breast cancer Maternal Aunt    Cancer Maternal Aunt    Breast cancer Maternal Aunt    Cancer Maternal Aunt    Breast cancer Maternal Aunt    Stomach cancer Maternal Grandmother    Cancer Maternal Grandmother        unknown female reproductive organ cancer   Endometrial cancer Niece    Pancreatic cancer Neg Hx    Allergies  Allergen Reactions  Nabumetone Swelling and Hives   Procaine Swelling   Shellfish Allergy  Anaphylaxis    ROS    Objective:    BP 130/82   Pulse 94   Ht 5' 4.25 (1.632 m)   Wt 233 lb 8 oz (105.9 kg)   SpO2 98%   BMI 39.77 kg/m  BP Readings from Last 3 Encounters:  08/18/23 130/82  08/04/23 112/61  05/16/23 126/88   Wt Readings from Last 3 Encounters:  08/18/23 233 lb 8 oz (105.9 kg)  08/04/23 237 lb (107.5 kg)  07/14/23 237 lb 2 oz (107.6 kg)    Physical Exam Vitals and nursing note reviewed.  Constitutional:      Appearance: Normal appearance.  HENT:     Head: Normocephalic.     Right Ear: Tympanic membrane, ear canal and external ear normal.     Left Ear: Tympanic membrane, ear canal and external ear normal.  Eyes:     Extraocular Movements: Extraocular movements intact.     Conjunctiva/sclera: Conjunctivae normal.     Pupils: Pupils are equal, round, and reactive to light.  Cardiovascular:     Rate and Rhythm: Normal rate and regular rhythm.     Heart sounds: Normal heart sounds.  Pulmonary:     Effort: Pulmonary effort is normal.     Breath sounds: Normal breath sounds.  Musculoskeletal:     Right lower leg: No edema.     Left lower leg: No  edema.  Neurological:     General: No focal deficit present.     Mental Status: She is alert and oriented to person, place, and time.  Psychiatric:        Mood and Affect: Mood normal.        Behavior: Behavior normal.        Thought Content: Thought content normal.        Judgment: Judgment normal.      No results found for any visits on 08/18/23.

## 2023-08-19 ENCOUNTER — Ambulatory Visit: Payer: Self-pay | Admitting: Family Medicine

## 2023-08-19 LAB — CBC WITH DIFFERENTIAL/PLATELET
Absolute Lymphocytes: 3939 {cells}/uL — ABNORMAL HIGH (ref 850–3900)
Absolute Monocytes: 585 {cells}/uL (ref 200–950)
Basophils Absolute: 65 {cells}/uL (ref 0–200)
Basophils Relative: 0.5 %
Eosinophils Absolute: 208 {cells}/uL (ref 15–500)
Eosinophils Relative: 1.6 %
HCT: 41.6 % (ref 35.0–45.0)
Hemoglobin: 12.8 g/dL (ref 11.7–15.5)
MCH: 25 pg — ABNORMAL LOW (ref 27.0–33.0)
MCHC: 30.8 g/dL — ABNORMAL LOW (ref 32.0–36.0)
MCV: 81.4 fL (ref 80.0–100.0)
MPV: 10 fL (ref 7.5–12.5)
Monocytes Relative: 4.5 %
Neutro Abs: 8203 {cells}/uL — ABNORMAL HIGH (ref 1500–7800)
Neutrophils Relative %: 63.1 %
Platelets: 307 Thousand/uL (ref 140–400)
RBC: 5.11 Million/uL — ABNORMAL HIGH (ref 3.80–5.10)
RDW: 15.1 % — ABNORMAL HIGH (ref 11.0–15.0)
Total Lymphocyte: 30.3 %
WBC: 13 Thousand/uL — ABNORMAL HIGH (ref 3.8–10.8)

## 2023-08-19 LAB — LIPID PANEL
Cholesterol: 180 mg/dL (ref <200)
HDL: 47 mg/dL — ABNORMAL LOW (ref >=50)
LDL Cholesterol (Calc): 112 mg/dL — ABNORMAL HIGH
Non-HDL Cholesterol (Calc): 133 mg/dL — ABNORMAL HIGH (ref <130)
Total CHOL/HDL Ratio: 3.8 (calc) (ref <5.0)
Triglycerides: 100 mg/dL (ref <150)

## 2023-08-19 LAB — HEMOGLOBIN A1C
Hgb A1c MFr Bld: 7.1 % — ABNORMAL HIGH (ref <5.7)
Mean Plasma Glucose: 157 mg/dL
eAG (mmol/L): 8.7 mmol/L

## 2023-08-19 LAB — COMPREHENSIVE METABOLIC PANEL WITH GFR
AG Ratio: 1.5 (calc) (ref 1.0–2.5)
ALT: 22 U/L (ref 6–29)
AST: 21 U/L (ref 10–35)
Albumin: 4.3 g/dL (ref 3.6–5.1)
Alkaline phosphatase (APISO): 90 U/L (ref 37–153)
BUN: 7 mg/dL (ref 7–25)
CO2: 28 mmol/L (ref 20–32)
Calcium: 9.7 mg/dL (ref 8.6–10.4)
Chloride: 104 mmol/L (ref 98–110)
Creat: 0.62 mg/dL (ref 0.50–1.05)
Globulin: 2.8 g/dL (ref 1.9–3.7)
Glucose, Bld: 115 mg/dL — ABNORMAL HIGH (ref 65–99)
Potassium: 4.4 mmol/L (ref 3.5–5.3)
Sodium: 142 mmol/L (ref 135–146)
Total Bilirubin: 0.4 mg/dL (ref 0.2–1.2)
Total Protein: 7.1 g/dL (ref 6.1–8.1)
eGFR: 102 mL/min/1.73m2 (ref >=60)

## 2023-09-15 ENCOUNTER — Ambulatory Visit
Admission: RE | Admit: 2023-09-15 | Discharge: 2023-09-15 | Disposition: A | Discharge status: Home / Self Care | Payer: 59 | Source: Ambulatory Visit | Attending: Hematology and Oncology

## 2023-09-15 DIAGNOSIS — Z1501 Genetic susceptibility to malignant neoplasm of breast: Secondary | ICD-10-CM

## 2023-09-15 MED ORDER — GADOPICLENOL 0.5 MMOL/ML IV SOLN
10.0000 mL | Freq: Once | INTRAVENOUS | Status: AC | PRN
  Administered 2023-09-15: 10 mL via INTRAVENOUS

## 2023-09-16 ENCOUNTER — Telehealth: Payer: Self-pay | Admitting: Hematology and Oncology

## 2023-09-16 NOTE — Telephone Encounter (Signed)
I informed the patient that the breast MRI was normal 

## 2023-09-30 ENCOUNTER — Encounter: Payer: Self-pay | Admitting: Family Medicine

## 2023-09-30 ENCOUNTER — Ambulatory Visit: Payer: 59 | Admitting: Family Medicine

## 2023-09-30 VITALS — BP 125/83 | HR 72 | Ht 64.25 in | Wt 234.6 lb

## 2023-09-30 DIAGNOSIS — R208 Other disturbances of skin sensation: Secondary | ICD-10-CM

## 2023-09-30 DIAGNOSIS — E559 Vitamin D deficiency, unspecified: Secondary | ICD-10-CM

## 2023-09-30 DIAGNOSIS — R5383 Other fatigue: Secondary | ICD-10-CM

## 2023-09-30 DIAGNOSIS — G43009 Migraine without aura, not intractable, without status migrainosus: Secondary | ICD-10-CM

## 2023-09-30 DIAGNOSIS — G35 Multiple sclerosis: Secondary | ICD-10-CM | POA: Diagnosis not present

## 2023-09-30 DIAGNOSIS — Z79899 Other long term (current) drug therapy: Secondary | ICD-10-CM

## 2023-09-30 NOTE — Patient Instructions (Signed)
Below is our plan:  We will continue current treatment plan.   Please make sure you are staying well hydrated. I recommend 50-60 ounces daily. Well balanced diet and regular exercise encouraged. Consistent sleep schedule with 6-8 hours recommended.   Please continue follow up with care team as directed.   Follow up with Dr Sater in 6 months   You may receive a survey regarding today's visit. I encourage you to leave honest feed back as I do use this information to improve patient care. Thank you for seeing me today!    

## 2023-09-30 NOTE — Progress Notes (Signed)
 PATIENT: Jacqueline Adams DOB: 1960/05/13  REASON FOR VISIT: follow up HISTORY FROM: patient  Chief Complaint  Patient presents with   Follow-up    Pt in room 1. Alone. Here for MS follow up on Avonex . Patient reports doing well, no falls, last eye exam was this year. No concerns.     HISTORY OF PRESENT ILLNESS:  09/30/23 ALL: [name] is a 63 y.o. female here today for follow up for RRMS. She continues Avonex . Labs were stable in 05/2023. Last MRI stable in 03/2023.   Overall she is doing well. Gait is stable without assistive device. She continues to have chronic fatigue and dysesthesias (mostly in hands and feet). Worse in the heat. Left arm aches from time to time. No weakness. Ibuprofen  usually helps.   Headaches worsened while on oral chem med. She is taking magnesium 400mg  daily which continues to help. Heat is a trigger. She stopped chemo tablet 02/2022. She had last breast MRI 09/15/2023. Followed regularly by oncology. She continues vitamin D  2000iu OTC. Last level 59.   She does sleep well. 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  She drinks plenty of water . She denies anxiety or depression. No changes in vision, bowel or bladder habits. Eye exam with Dr Abigail 08/19/2023 reportedly stable.    HISTORY: (copied from Dr Duncan previous note)  [name] is a 63 y.o. woman with relapsing remitting MS diagnosed in 2009.     Update  03/17/2023: She is on Avonex  and tolerates it well.    She is getting the drug through an assistance.  She tolerates it well.  No injection reactions.     No skin reactios    Gait does well on well on flat surfaces.     She usually goes downstairs without the banister.   She can walk 2 miles.   She has pain and numbness shooting pain in her right leg into her thigh at times but this is not worsening and not bad enough to treat.   Bladder is doing well.   Vision is fine.     She denies much fatigue.   Her  sleep pattern is better.  Sleep was wrse when on chemo.   She is walking some everyday.   She denies depression.and tried to keep a positive attitude.  Cognition is doing well.     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    She was on carboplatin  and paclitaxel .   She gets shooting pain in her right hand/arm at times only.    She finished radiation 12/2020.     She sees Dr. Gudena and last him 2 weeks ago.   She has a mammogram in a couple weeks and breast MRI in 6 months.    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  Sometimes triggered by weather patterns and allergies.       MS History:  In 2009, she had the onset of right arm and leg tingling and clumsiness. At the time, she was seeing Dr. Velinda for migraines and he performed a nerve conduction study only showing mild carpal tunnel syndrome. Symptoms worsened the next day and she was referred to Dr. Maurice. An MRI of the brain, cervical spine and a lumbar puncture was performed. The brain MRI showed  a large left frontoparietal periventricular focus and several other foci. CSF was consistent with multiple sclerosis.  MRI of the cervical spine was normal. She was started on Avonex .   She was switched to Tecfidera in late 2014 as she had one new focus on her 2014 MRI. She switched back to Avonex  in 2016..   Imaging MRI of the brain 04/29/2018 showed T2/FLAIR hyperintense foci in the hemispheres in a pattern consistent with demyelination/MS.  None of the foci were acute and there were no new lesions compared to the MRI from 2018.   MRI of the brain 05/22/2016 showed T2/FLAIR foci in the hemispheres consistent with MS.  There were no acute findings.  No new lesions compared to 05/14/2015   MRI of the cervical spine 05/22/2016 showed a normal spinal cord and mild disc degenerative changes but no nerve root compression or spinal  stenosis.   MRO of the brain 09/19/2020 showed Several T2/FLAIR hyperintense foci in the left hemisphere in a pattern consistent with chronic demyelinating plaque associated with multiple sclerosis.  None of the foci enhance.  Compared to the MRI from 04/29/2018, there are no new lesions.   MRI cervical spine showed a normal spinal cord and DJD but no spinal stenosis or nerve root compression  REVIEW OF SYSTEMS: Out of a complete 14 system review of symptoms, the patient complains only of the following symptoms, chronic fatigue, dysesthesias, headaches and all other reviewed systems are negative.   ALLERGIES: Allergies  Allergen Reactions   Nabumetone Swelling and Hives   Procaine Swelling   Shellfish Allergy  Anaphylaxis    HOME MEDICATIONS: Outpatient Medications Prior to Visit  Medication Sig Dispense Refill   Calcium Carb-Cholecalciferol (CALCIUM 600+D3 PO) Take 1 tablet by mouth in the morning and at bedtime.     fexofenadine (ALLEGRA) 180 MG tablet Take 180 mg by mouth daily as needed for allergies.     fluticasone  (FLONASE ) 50 MCG/ACT nasal spray Place 2 sprays into both nostrils daily. 16 g 2   ibuprofen  (ADVIL ) 200 MG tablet Take 600 mg by mouth as needed. W/Avonex  injection     interferon beta-1a  (AVONEX  PREFILLED) 30 MCG/0.5ML PSKT injection INJECT ONE SYRINGE INTRAMUSCULARLY ONCE WEEKLY. REFRIGERATE. PROTECT FROM LIGHT. ALLOW TO WARM TO ROOM TEMPERATURE PRIOR TO USE. 3 each 3   KLOR-CON  M20 20 MEQ tablet TAKE 1 TABLET BY MOUTH EVERY DAY IN THE MORNING 90 tablet 3   magnesium oxide (MAG-OX) 400 (240 Mg) MG tablet Take 400 mg by mouth daily. Use for headaches due to olaparib  per neurology OTC     metoprolol  succinate (TOPROL -XL) 25 MG 24 hr tablet Take 25 mg by mouth daily.     Multiple Vitamin (MULTIVITAMIN WITH MINERALS) TABS tablet Take 1 tablet by mouth every evening. Centrum     simvastatin  (ZOCOR ) 20 MG tablet Take 1 tablet (20 mg total) by mouth every evening. 90 tablet 1    VENTOLIN HFA 108 (90 BASE) MCG/ACT inhaler Inhale 1-2 puffs into the lungs every 6 (six) hours as needed for shortness of breath or wheezing.     No facility-administered medications prior to visit.    PAST MEDICAL HISTORY: Past Medical History:  Diagnosis Date   Allergy  1979   Shellfish alergy   Anemia    BRCA2 gene mutation positive in female    Breast cancer (HCC) 01/2020   right breast IDC   Bronchitis    Cervical spondylarthritis    Headache(784.0)    High cholesterol  Hypertension    Leukocytosis 2009   Multiple sclerosis (HCC) 2009   followed by Dr. Maurice   Neuromuscular disorder [facility])    Personal history of chemotherapy    Personal history of radiation therapy    Pneumonia    Rhinitis    Seasonal allergies    Sinusitis     PAST SURGICAL HISTORY: Past Surgical History:  Procedure Laterality Date   ABDOMINAL HYSTERECTOMY  2006   uterus, cervix, portion of fallopian tubes for AUB   BREAST BIOPSY Left 10/10/2021   BREAST BIOPSY Right 02/02/2020   BREAST BIOPSY Right 01/28/2020   BREAST BIOPSY Right 12/31/2019   times 2   BREAST LUMPECTOMY Right 08/08/2020   BREAST LUMPECTOMY WITH RADIOACTIVE SEED AND SENTINEL LYMPH NODE BIOPSY Right 08/08/2020   Procedure: RIGHT BREAST LUMPECTOMY WITH RADIOACTIVE SEED X 3 AND SENTINEL LYMPH NODE BIOPSY;  Surgeon: Aron Shoulders, MD;  Location: [facility] OR;  Service: General;  Laterality: Right;   CESAREAN SECTION     x 3   CORRECTION HAMMER TOE Bilateral    PORT-A-CATH REMOVAL Right 09/21/2020   Procedure: REMOVAL PORT-A-CATH;  Surgeon: Aron Shoulders, MD;  Location: [facility] OR;  Service: General;  Laterality: Right;   PORTACATH PLACEMENT Right 02/22/2020   Procedure: RIGHT SUBCLAVIAN VEIN PORT PLACEMENT;  Surgeon: Belinda Cough, MD;  Location: [facility];  Service: General;  Laterality: Right;   ROBOTIC ASSISTED BILATERAL SALPINGO OOPHERECTOMY Bilateral 01/30/2021   Procedure: XI ROBOTIC ASSISTED BILATERAL SALPINGO  OOPHORECTOMY, LYSIS OF ADHESIONS, 1 HOUR;  Surgeon: Viktoria Comer SAUNDERS, MD;  Location: [facility];  Service: Gynecology;  Laterality: Bilateral;   TUBAL LIGATION  08/04/02    FAMILY HISTORY: Family History  Problem Relation Age of Onset   Colon polyps Mother    Colon cancer Mother    Cancer Mother 44       ovarian cancer; now with suspected breast cancer   Diabetes Mother        also HTN   Breast cancer Mother    Clotting disorder Father    Prostate cancer Father    Colon polyps Sister    Colon cancer Sister 50       anal cancer (2014)   Cancer Sister    Rectal cancer Sister    Lung cancer Sister 27   Cancer Sister    Esophageal cancer Sister    Stroke Sister 23   Ovarian cancer Sister 16   Hypertension Brother    Hypertension Brother    Breast cancer Maternal Aunt    Cancer Maternal Aunt    Breast cancer Maternal Aunt    Cancer Maternal Aunt    Breast cancer Maternal Aunt    Stomach cancer Maternal Grandmother    Cancer Maternal Grandmother        unknown female reproductive organ cancer   Endometrial cancer Niece    Pancreatic cancer Neg Hx     SOCIAL HISTORY: Social History   Socioeconomic History   Marital status: Married    Spouse name: John   Number of children: 2   Years of education: Nature conservation officer education level: Not on file  Occupational History   Occupation: Management consultant: [employer]  Tobacco Use   Smoking status: Never   Smokeless tobacco: Never  Vaping Use   Vaping status: Never Used  Substance and Sexual Activity   Alcohol use: Never   Drug use: Never   Sexual activity: Not Currently    Birth  control/protection: Surgical  Other Topics Concern   Not on file  Social History Narrative   Patient lives at home with her spouse.   Caffeine Use: 2 sodas daily   Social Drivers of Corporate investment banker Strain: Low Risk  (08/14/2023)   Overall Financial Resource Strain (CARDIA)    Difficulty of Paying Living Expenses: Not hard  at all  Food Insecurity: No Food Insecurity (08/14/2023)   Hunger Vital Sign    Worried About Running Out of Food in the Last Year: Never true    Ran Out of Food in the Last Year: Never true  Transportation Needs: No Transportation Needs (08/14/2023)   PRAPARE - Administrator, Civil Service (Medical): No    Lack of Transportation (Non-Medical): No  Physical Activity: Insufficiently Active (08/14/2023)   Exercise Vital Sign    Days of Exercise per Week: 4 days    Minutes of Exercise per Session: 20 min  Stress: No Stress Concern Present (08/14/2023)   [employer] of Occupational Health - Occupational Stress Questionnaire    Feeling of Stress: Not at all  Social Connections: Socially Integrated (08/14/2023)   Social Connection and Isolation Panel    Frequency of Communication with Friends and Family: More than three times a week    Frequency of Social Gatherings with Friends and Family: Twice a week    Attends Religious Services: More than 4 times per year    Active Member of [employer] or Organizations: Yes    Attends Banker Meetings: More than 4 times per year    Marital Status: Married  Catering manager Violence: Not At Risk (04/25/2020)   Received from [facility] visits prior to 04/13/2022.   Humiliation, Afraid, Rape, and Kick questionnaire    Within the last year, have you been afraid of your partner or ex-partner?: No    Within the last year, have you been humiliated or emotionally abused in other ways by your partner or ex-partner?: No    Within the last year, have you been kicked, hit, slapped, or otherwise physically hurt by your partner or ex-partner?: No    Within the last year, have you been raped or forced to have any kind of sexual activity by your partner or ex-partner?: No      PHYSICAL EXAM  Vitals:   09/30/23 1046  BP: 125/83  Pulse: 72  SpO2: 98%  Weight: 234 lb 9.6 oz (106.4 kg)  Height: 5' 4.25 (1.632 m)       Body mass index is 39.96 kg/m.  Generalized: Well developed, in no acute distress  Cardiology: normal rate and rhythm, no murmur noted Respiratory: clear to auscultation bilaterally  Neurological examination  Mentation: Alert oriented to time, place, history taking. Follows all commands speech and language fluent Cranial nerve II-XII: Pupils were equal round reactive to light. Extraocular movements were full, visual field were full on confrontational test. Facial sensation and strength were normal. Uvula tongue midline. Head turning and shoulder shrug  were normal and symmetric. Motor: The motor testing reveals 5 over 5 strength of all 4 extremities. Good symmetric motor tone is noted throughout.  Sensory: Sensory testing is intact to soft touch on all 4 extremities. No evidence of extinction is noted.  Coordination: Cerebellar testing reveals good finger-nose-finger and heel-to-shin bilaterally.  Gait and station: Gait is normal.   Reflexes: Deep tendon reflexes are symmetric and normal bilaterally.   DIAGNOSTIC DATA (LABS, IMAGING, TESTING) - I  reviewed patient records, labs, notes, testing and imaging myself where available.      No data to display           Lab Results  Component Value Date   WBC 13.0 (H) 08/18/2023   HGB 12.8 08/18/2023   HCT 41.6 08/18/2023   MCV 81.4 08/18/2023   PLT 307 08/18/2023      Component Value Date/Time   NA 142 08/18/2023 0904   NA 142 05/05/2020 1625   NA 143 11/22/2013 1514   K 4.4 08/18/2023 0904   K 3.1 (L) 11/22/2013 1514   CL 104 08/18/2023 0904   CL 105 02/18/2012 1207   CO2 28 08/18/2023 0904   CO2 25 11/22/2013 1514   GLUCOSE 115 (H) 08/18/2023 0904   GLUCOSE 105 11/22/2013 1514   GLUCOSE 96 02/18/2012 1207   BUN 7 08/18/2023 0904   BUN 8 05/05/2020 1625   BUN 6.6 (L) 11/22/2013 1514   CREATININE 0.62 08/18/2023 0904   CREATININE 0.8 11/22/2013 1514   CALCIUM 9.7 08/18/2023 0904   CALCIUM 9.0 11/22/2013 1514    PROT 7.1 08/18/2023 0904   PROT 7.0 09/22/2019 0840   PROT 7.4 11/22/2013 1514   ALBUMIN 3.9 03/05/2022 1029   ALBUMIN 4.0 09/22/2019 0840   ALBUMIN 3.3 (L) 11/22/2013 1514   AST 21 08/18/2023 0904   AST 22 03/05/2022 1029   AST 15 11/22/2013 1514   ALT 22 08/18/2023 0904   ALT 22 03/05/2022 1029   ALT 13 11/22/2013 1514   ALKPHOS 87 03/05/2022 1029   ALKPHOS 91 11/22/2013 1514   BILITOT 0.4 08/18/2023 0904   BILITOT 0.4 03/05/2022 1029   BILITOT 0.23 11/22/2013 1514   GFRNONAA >60 03/05/2022 1029   GFRAA 113 09/22/2019 0840   Lab Results  Component Value Date   CHOL 180 08/18/2023   HDL 47 (L) 08/18/2023   LDLCALC 112 (H) 08/18/2023   TRIG 100 08/18/2023   CHOLHDL 3.8 08/18/2023   Lab Results  Component Value Date   HGBA1C 7.1 (H) 08/18/2023   No results found for: VITAMINB12 Lab Results  Component Value Date   TSH 1.29 05/16/2023       ASSESSMENT AND PLAN 63 y.o. year old female  has a past medical history of Allergy  (1979), Anemia, BRCA2 gene mutation positive in female, Breast cancer (HCC) (01/2020), Bronchitis, Cervical spondylarthritis, Headache(784.0), High cholesterol, Hypertension, Leukocytosis (2009), Multiple sclerosis (HCC) (2009), Neuromuscular disorder (HCC), Personal history of chemotherapy, Personal history of radiation therapy, Pneumonia, Rhinitis, Seasonal allergies, and Sinusitis. here with     ICD-10-CM   1. Multiple sclerosis (HCC)  G35     2. Dysesthesia  R20.8     3. Other fatigue  R53.83     4. Migraine without aura and without status migrainosus, not intractable  G43.009     5. High risk medication use  Z79.899     6. Vitamin D  deficiency  E55.9         Victorious is doing well. Symptoms are stable. No new or exacerbating symptoms. We will continue Avonex  injections as prescribed. Labs from oncology reviewed in Epic are stable. She will continue vitamin D  supplement OTC. Continue magnesium for headache prevention. She will continue  focusing on healthy lifestyle habits. She will follow up in 6 months, sooner if needed. She verbalizes understanding and agreement with this plan.    No orders of the defined types were placed in this encounter.    No orders of the defined  types were placed in this encounter.    I spent 30 minutes of face-to-face and non-face-to-face time with patient.  This included previsit chart review, lab review, study review, order entry, electronic health record documentation, patient education.   Greig Forbes, FNP-C 09/30/2023, 12:10 PM [facility] [address], [address] [place], [place] [address] [phone]

## 2023-10-14 NOTE — Progress Notes (Signed)
 PROVIDER:  JINA CLAIR NEPHEW, MD Patient Care Team: Aletha Connie Ip, MD as PCP - General (Family Medicine) NEPHEW JINA CLAIR, MD as Consulting Provider (Surgical Oncology) Gudena, Vinay K, MD (Hematology and Oncology) Dewey Norleen Hamilton, MD (Radiation Oncology) Delford Maude BROCKS, MD (Cardiovascular Disease)  MRN: [id number] DOB: 04/14/1960 DATE OF ENCOUNTER: 10/14/2023    Chief Complaint: Follow-up ( 4 mth R BR LTFU, s/p R lumpectomy w RSx3 & SLNB 08/08/20/)   History of Present Illness: Jacqueline Adams is a 62 y.o. female who is seen today for breast cancer follow up.  Initial history:   Pt had known BRCA 2 defect and presented with right breast cancer 12/2019.  She had been getting alternating mammograms and MRIs.  She had a negative mammogram 03/2019.  She felt a mass around september/october 2021. 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  She subsequently underwent MRI and had an additional 6 mm mass near the other two.  This was invasive ductal carcinoma as well.  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  Prognostic markers were not performed on the third cancer, and there was not sufficient tissue to do markers on the DCIS.     She saw Drs. Bird-in-Hand, Gudena, and Tsuei in the [facility]  She got a port and started chemotherapy.  She wanted to switch to a female physician and came to see me.  Her sister also has BRCA2.  Of note, the patient has multiple sclerosis.     She has significant family cancer history as expected with BRCA2.  Her mother had breast and ovarian cancer, but is still alive at age 59.  Sisters have had colon cancer, and ovarian cancer.  three maternal aunts have had breast cancer, and maternal grandmother had some form of female reproductive cancer.  Father had prostate cancer.  Another sister had lung cancer.     She  is a G3P2 with menarche at age 48, menopause in early 28s, and first child born in early 64s.  She breast fed for 6-12 months between her children.     She underwent MRI and had several additional biopsies.  These are all in the same quadrant.  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  She received neoadjuvant chemotherapy.     Pt did not want to have bilateral mastectomies and wanted to have increased screening.  We did a large right sided seed bracketed lumpectomy with SLN bx 08/08/20.  Margins and nodes were negative.  There was no residual invasive cancer.  3 mm of DCIS was the largest focus of any cancer remaining.  She passed out the night of surgery in her bathroom.  She went to the ED and got checked out.  Her HCT went from 34.5 to 30.4.  Vitals were Ok, so she was able to go home.     She developed opening in her right breast incision in August 2022 and started draining fluid. This healed up but left a small divot.     I took out her port 09/21/20.    She received adjuvant XRT and olaparib .    Interval history:   History of Present Illness History of Present Illness Doing well overall.  Lymphedema is improved.  No significant breast pain.  Patient had a slight flare with the heat of her multiple sclerosis.  MRI performed last month showed no evidence of malignancy.    09/15/23 mr breast  IMPRESSION: No MR evidence of breast malignancy.  RECOMMENDATION: Bilateral screening mammogram in 7 months to resume annual mammogram schedule.  Bilateral screening breast MRI/contrast mammography in 1 year in this high risk patient.  BI-RADS CATEGORY  2: Benign.    Mammogram 04/07/23  ACR Breast Density Category b: There are scattered areas of fibroglandular density.  FINDINGS: The right breast lumpectomy site is stable. No suspicious calcifications, masses or areas of distortion are seen in the bilateral breasts.  IMPRESSION: Stable right breast lumpectomy site. No mammographic  evidence of malignancy in the bilateral breasts.  RECOMMENDATION: Per protocol, as the patient is now 2 or more years status post lumpectomy, she may return to annual screening mammography in 1 year. However, given the history of breast cancer, the patient remains eligible for annual diagnostic mammography if preferred.  I have discussed the findings and recommendations with the patient. If applicable, a reminder letter will be sent to the patient regarding the next appointment.  BI-RADS CATEGORY  2: Benign.  MRI brain 03/25/23 IMPRESSION: This MRI of the brain with and without contrast shows the following: T2/FLAIR hyperintense foci in the cerebral hemispheres in a pattern consistent with chronic to moderate plaque associated with multiple sclerosis.  None of the foci enhance or appear to be acute.  Compared to the MRI from 07/10/2021, there were no new lesions. Normal enhancement pattern.  No acute findings.  Review of Systems: A complete review of systems was obtained from the patient.  I have reviewed this information and discussed as appropriate with the patient.  See HPI as well for other ROS.  ROS  O/w negative.    Medical History: Past Medical History:  Diagnosis Date  . History of cancer     Patient Active Problem List  Diagnosis  . Acute maxillary sinusitis  . Arthralgia of hip  . Arthralgia of right temporomandibular joint  . Hip pain, left  . Atypical chest pain  . BRCA gene mutation positive in female  . Other cervical disc degeneration, unspecified cervical region  . Chronic fatigue, unspecified  . Cervicalgia  . Other fatigue  . Chronic infection of sinus  . Cough  . Cough variant asthma (HHS-HCC)  . DOE (dyspnea on exertion)  . Dysfunction of both eustachian tubes  . Dyslipidemia  . Encounter for general adult medical examination without abnormal findings  . Genetic susceptibility to malignant neoplasm of breast  . High risk medication use  . Taking  drug for chronic disease  . History of pneumonia  . Hot flashes  . Leukocytosis  . Malignant neoplasm of upper-outer quadrant of right breast in female, estrogen receptor negative (CMS/HHS-HCC)  . Menopausal syndrome (hot flashes)  . Microcytic anemia  . Migraine without aura  . Migraine, unspecified, not intractable, without status migrainosus  . Hyperlipidemia, unspecified  . Mixed hyperlipidemia  . Multiple sclerosis (CMS/HHS-HCC)  . Spasms of the hands or feet  . Obesity due to excess calories with serious comorbidity  . Morbid obesity (CMS/HHS-HCC)  . Numbness  . Need for hepatitis C screening test  . Port-A-Cath in place  . Sciatica  . Seasonal allergic rhinitis due to pollen  . Taking a statin medication  . Urinary frequency  . Vitamin D  deficiency  . Falls frequently  . Abnormality of gait due to impairment of balance  . Lymphedema of breast    Past Surgical History:  Procedure  Laterality Date  . Port-A-Cath Removal  09/21/2020   Dr. Aron  . Right Breast Lumpectomy       Allergies  Allergen Reactions  . Nabumetone Swelling and Hives  . Procaine Swelling    Localized Facial   . Shellfish Containing Products Anaphylaxis  . Sulfacetamide Sodium-Sulfur Swelling    Localized Facial     Current Outpatient Medications on File Prior to Visit  Medication Sig Dispense Refill  . AVONEX  30 mcg/0.5 mL injection     . ergocalciferol , vitamin D2, 10 mcg (400 unit) Tab Take by mouth    . ferrous sulfate 325 (65 FE) MG tablet Take by mouth    . fexofenadine (ALLEGRA) 180 MG tablet Take 180 mg by mouth    . ibuprofen  (MOTRIN ) 200 MG tablet Take by mouth    . metoprolol  SUCCinate (TOPROL -XL) 25 MG XL tablet Take 25 mg by mouth once daily    . multivitamin with minerals tablet Take by mouth    . potassium chloride  (KLOR-CON ) 20 MEQ ER tablet Take by mouth    . simvastatin  (ZOCOR ) 20 MG tablet Take 20 mg by mouth once daily     No current facility-administered  medications on file prior to visit.    Family History  Problem Relation Age of Onset  . Diabetes Mother   . Breast cancer Mother   . Deep vein thrombosis (DVT or abnormal blood clot formation) Father      Social History   Tobacco Use  Smoking Status Never  Smokeless Tobacco Never     Social History   Socioeconomic History  . Marital status: Married  Tobacco Use  . Smoking status: Never  . Smokeless tobacco: Never  Substance and Sexual Activity  . Alcohol use: Not Currently  . Drug use: Never   Social Drivers of Corporate investment banker Strain: Low Risk  (08/14/2023)   Received from [facility]   Overall Financial Resource Strain (CARDIA)   . How hard is it for you to pay for the very basics like food, housing, medical care, and heating?: Not hard at all  Food Insecurity: No Food Insecurity (08/14/2023)   Received from [facility]   Hunger Vital Sign   . Within the past 12 months, you worried that your food would run out before you got the money to buy more.: Never true   . Within the past 12 months, the food you bought just didn't last and you didn't have money to get more.: Never true  Transportation Needs: No Transportation Needs (08/14/2023)   Received from [facility] - Transportation   . In the past 12 months, has lack of transportation kept you from medical appointments or from getting medications?: No   . In the past 12 months, has lack of transportation kept you from meetings, work, or from getting things needed for daily living?: No  Physical Activity: Insufficiently Active (08/14/2023)   Received from [facility]   Exercise Vital Sign   . On average, how many days per week do you engage in moderate to strenuous exercise (like a brisk walk)?: 4 days   . On average, how many minutes do you engage in exercise at this level?: 20 min  Stress: No Stress Concern Present (08/14/2023)   Received from [facility] Occupational [facility] -  Occupational Stress Questionnaire   . Do you feel stress - tense, restless, nervous, or anxious, or unable to sleep at  night because your mind is troubled all the time - these days?: Not at all  Social Connections: Socially Integrated (08/14/2023)   Received from [facility]   Social Connection and Isolation Panel   . In a typical week, how many times do you talk on the phone with family, friends, or neighbors?: More than three times a week   . How often do you get together with friends or relatives?: Twice a week   . How often do you attend church or religious services?: More than 4 times per year   . Do you belong to any clubs or organizations such as church groups, unions, fraternal or athletic groups, or school groups?: Yes   . How often do you attend meetings of the clubs or organizations you belong to?: More than 4 times per year   . Are you married, widowed, divorced, separated, never married, or living with a partner?: Married  Housing Stability: Unknown (06/16/2023)   Housing Stability Vital Sign   . Homeless in the Last Year: No    Objective:    Vitals:   10/14/23 1118  PainSc: 0-No pain    There is no height or weight on file to calculate BMI.  Head: Normocephalic and atraumatic.  Eyes:  Conjunctivae are normal. Pupils are equal, round, and reactive to light. No scleral icterus.  Neck:  Normal range of motion. Neck supple. No tracheal deviation present. No thyromegaly present.  Resp: No respiratory distress, normal effort. Breast:  no significant change.  right breast smaller than left.  Right breast with some radiation contraction with indention on lateral aspect at lumpectomy site.  Some lymphedema lower right breast.  Anticipated thickening at surgical site right.   No LAD no nipple retraction or discharge. Abd:  Abdomen is soft, non distended and non tender. No masses are palpable.  There is no rebound and no guarding.  Neurological: Alert and oriented to person, place, and  time. Coordination normal.  Skin: Skin is warm and dry. No rash noted. No diaphoretic. No erythema. No pallor.  Psychiatric: Normal mood and affect. Normal behavior. Judgment and thought content normal.    Labs, Imaging and Diagnostic Testing:  None new.    Assessment and Plan:     Diagnoses and all orders for this visit:  Malignant neoplasm of upper-outer quadrant of right breast in female, estrogen receptor negative (CMS/HHS-HCC)  Lymphedema of breast  BRCA gene mutation positive in female   Assessment & Plan  BRCA gene mutation positive   Lymphedema improved No clinical evidence of breast cancer   MR due 09/2024 for brca mutation Mammogram due 03/2024 at [facility].  Follow up with Dr. Gudena 02/2024 Continue multiple sclerosis follow-up with neurology  Return in about 6 months (around 04/12/2024) for breast cancer follow up.   JINA CLAIR NEPHEW, MD

## 2023-11-18 ENCOUNTER — Telehealth: Payer: Self-pay | Admitting: Pharmacy Technician

## 2023-11-18 ENCOUNTER — Other Ambulatory Visit (HOSPITAL_COMMUNITY): Payer: Self-pay

## 2023-11-18 ENCOUNTER — Encounter: Payer: Self-pay | Admitting: Family Medicine

## 2023-11-18 ENCOUNTER — Ambulatory Visit: Admitting: Family Medicine

## 2023-11-18 VITALS — BP 138/100 | HR 78 | Temp 98.2°F | Ht 64.25 in | Wt 235.0 lb

## 2023-11-18 DIAGNOSIS — G35D Multiple sclerosis, unspecified: Secondary | ICD-10-CM | POA: Diagnosis not present

## 2023-11-18 DIAGNOSIS — Z23 Encounter for immunization: Secondary | ICD-10-CM | POA: Diagnosis not present

## 2023-11-18 DIAGNOSIS — Z79899 Other long term (current) drug therapy: Secondary | ICD-10-CM

## 2023-11-18 DIAGNOSIS — Z7985 Long-term (current) use of injectable non-insulin antidiabetic drugs: Secondary | ICD-10-CM

## 2023-11-18 DIAGNOSIS — E1165 Type 2 diabetes mellitus with hyperglycemia: Secondary | ICD-10-CM

## 2023-11-18 DIAGNOSIS — E785 Hyperlipidemia, unspecified: Secondary | ICD-10-CM

## 2023-11-18 DIAGNOSIS — R000 Tachycardia, unspecified: Secondary | ICD-10-CM

## 2023-11-18 MED ORDER — TIRZEPATIDE 2.5 MG/0.5ML ~~LOC~~ SOAJ
2.5000 mg | SUBCUTANEOUS | 1 refills | Status: AC
Start: 2023-11-18 — End: ?

## 2023-11-18 MED ORDER — LOSARTAN POTASSIUM 25 MG PO TABS: 25.0000 mg | ORAL_TABLET | Freq: Every day | ORAL | 1 refills | Status: AC

## 2023-11-18 NOTE — Telephone Encounter (Signed)
 Pharmacy Patient Advocate Encounter  Received notification from [facility] that Prior Authorization for Mounjaro 2.5MG /0.5ML auto-injectors has been APPROVED from 11/18/23 to 11/17/26. Ran test claim, Copay is $25.00. This test claim was processed through [facility]- copay amounts may vary at other pharmacies due to pharmacy/plan contracts, or as the patient moves through the different stages of their insurance plan.   PA #/Case ID/Reference #: [id number]  [employer]illy, manufacturer of Mounjaro evoucher paid $25.00 towards the copay when filled at [facility]

## 2023-11-18 NOTE — Progress Notes (Signed)
 Patient Office Visit  Assessment & Plan:  Type 2 diabetes mellitus with hyperglycemia, without long-term current use of insulin (HCC) -     Hemoglobin A1c -     Losartan Potassium; Take 1 tablet (25 mg total) by mouth daily.  Dispense: 90 tablet; Refill: 1 -     Tirzepatide; Inject 2.5 mg into the skin once a week.  Dispense: 2 mL; Refill: 1  Immunization due -     Varicella-zoster vaccine IM  Taking a statin medication  Hyperlipidemia, unspecified hyperlipidemia type -     CBC with Differential/Platelet -     Comprehensive metabolic panel with GFR -     Lipid panel  Multiple sclerosis  Tachycardia -     CBC with Differential/Platelet -     Comprehensive metabolic panel with GFR   Assessment and Plan    Type 2 Diabetes Mellitus Type 2 Diabetes Mellitus with consideration for additional management to protect renal function. - Prescribe Mounjaro 2.5 mg weekly for diabetes management and weight loss. - Order blood work to monitor diabetes control. - Discuss insurance coverage for GLP-1 receptor agonists.  Hypertension Hypertension with slightly elevated blood pressure, possibly due to recent stress. Current management includes metoprolol . - Prescribe losartan 25 mg daily for renal protection and blood pressure control. - Continue metoprolol .  Obesity Obesity management discussed in conjunction with diabetes management. Consideration of GLP-1 receptor agonists for weight loss. - Prescribe Mounjaro 2.5 mg weekly for weight loss. - Encourage high fiber and protein diet.  Fatty Liver Disease Fatty liver disease noted on previous imaging. Discussed potential benefits of weight loss and GLP-1 receptor agonists. - Prescribe Mounjaro 2.5 mg weekly for potential improvement of fatty liver disease. - Encourage weight loss and healthy diet. - Advise against alcohol consumption.  Breast Cancer Breast cancer with ongoing follow-up. Currently well-managed with no recent changes  in management. - Continue regular follow-ups with oncologist and surgeon.  Diverticulosis of Colon Diverticulosis noted on recent colonoscopy. - Encourage high fiber diet to prevent complications such as diverticulitis.  Borderline Intraocular Pressure (Ocular Hypertension) Borderline intraocular pressure monitored every six months. - Continue six-monthly follow-ups with ophthalmologist.  General Health Maintenance General health maintenance discussed, including vaccinations and screenings. - Administer shingles vaccine. - Schedule pneumonia vaccine in two weeks. - Continue regular eye exams and screenings.     Recommend healthy diet i.e mediterranean/DASH diet, consistent exercise - 30 minutes 5 day per week, and gradual weight loss. Patient of potential negative side effects of the Mounjaro.  Patient will know to increase protein and fiber intake.  Continue gradual exercise program.  Patient will keep us  up-to-date via MyChart.  Return in 3 months or sooner if necessary.  Shingrix  given today.     No follow-ups on file.   Subjective:    Patient ID: Jacqueline Adams, female    DOB: November 14, 1960  Age: 63 y.o. MRN: [id number]  Chief Complaint  Patient presents with   Medical Management of Chronic Issues    HPI Discussed the use of AI scribe software for clinical note transcription with the patient, who gave verbal consent to proceed.  History of Present Illness        History of Present Illness Jacqueline Adams is a 63 year old female who presents for a routine follow-up visit.  She maintains her physical activity by walking with her nephew on weekends at local parks and inside her house. She works a hybrid schedule, going to the office on  Mondays and Wednesdays. 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  She is currently taking metoprolol  for heart rate control, which has been effective since her heart rate stabilized post-chemotherapy. Her blood  pressure is slightly elevated, possibly due to recent stress from driving in traffic.  She has a history of chemotherapy for BRCA positive breast CA and experienced significant side effects, including headaches and a racing heart. She took a chemotherapy pill that caused numerous side effects. She follows up with her oncologist every 6 months and reports that her visits have been reduced to yearly recently  She has been diagnosed with diverticulosis, identified during a colonoscopy in June 2025. A previous scan showed fatty liver. Patient has questions about how to treat this.   Her eye pressures are monitored every six months due to borderline levels, but she has not been prescribed any drops. Her sister, who also goes to the same eye doctor, has glaucoma and is on drops.  Her family history includes her sister and mother having cataracts, with her sister also having glaucoma. Her sister had cataract surgery a few years ago, and her mother had it earlier.  She cooks at home mostly and occasionally eats out, preferring healthier options like salads and soups. She enjoys [place] soup, especially during her chemotherapy, as it was comforting and healing.  She is up to date with her mammogram and colonoscopy screenings.  Physical Exam CARDIOVASCULAR: Heart sounds normal.  Results RADIOLOGY CT scan: Fatty liver  DIAGNOSTIC Colonoscopy: Diverticulosis (08/04/2023)  Assessment and Plan Type 2 Diabetes Mellitus Type 2 Diabetes Mellitus with consideration for additional management to protect renal function. - Prescribe Mounjaro 2.5 mg weekly for diabetes management and weight loss. - Order blood work to monitor diabetes control. - Discuss insurance coverage for GLP-1 receptor agonists.  Hypertension Hypertension with slightly elevated blood pressure, possibly due to recent stress. Current management includes metoprolol . - Prescribe losartan 25 mg daily for renal protection and blood pressure  control. - Continue metoprolol .  Obesity Obesity management discussed in conjunction with diabetes management. Consideration of GLP-1 receptor agonists for weight loss. - Prescribe Mounjaro 2.5 mg weekly for weight loss. - Encourage high fiber and protein diet.  Fatty Liver Disease Fatty liver disease noted on previous imaging. Discussed potential benefits of weight loss and GLP-1 receptor agonists. - Prescribe Mounjaro 2.5 mg weekly for potential improvement of fatty liver disease. - Encourage weight loss and healthy diet. - Advise against alcohol consumption.  Breast Cancer Breast cancer with ongoing follow-up. Currently well-managed with no recent changes in management. - Continue regular follow-ups with oncologist and surgeon.  Diverticulosis of Colon Diverticulosis noted on recent colonoscopy. - Encourage high fiber diet to prevent complications such as diverticulitis.  Borderline Intraocular Pressure (Ocular Hypertension) Borderline intraocular pressure monitored every six months. - Continue six-monthly follow-ups with ophthalmologist.  General Health Maintenance General health maintenance discussed, including vaccinations and screenings. - Administer shingles vaccine. - Schedule pneumonia vaccine in two weeks. - Continue regular eye exams and screenings.    The 10-year ASCVD risk score (Arnett DK, et al., 2019) is: 14.6%  Past Medical History:  Diagnosis Date   Allergy  1979   Shellfish alergy   Anemia    BRCA2 gene mutation positive in female    Breast cancer (HCC) 01/2020   right breast IDC   Bronchitis    Cervical spondylarthritis    Headache(784.0)    High cholesterol    Hypertension    Leukocytosis  2009   Multiple sclerosis 2009   followed by Dr. Maurice   Neuromuscular disorder [facility])    Personal history of chemotherapy    Personal history of radiation therapy    Pneumonia    Rhinitis    Seasonal allergies    Sinusitis    Past Surgical History:   Procedure Laterality Date   ABDOMINAL HYSTERECTOMY  2006   uterus, cervix, portion of fallopian tubes for AUB   BREAST BIOPSY Left 10/10/2021   BREAST BIOPSY Right 02/02/2020   BREAST BIOPSY Right 01/28/2020   BREAST BIOPSY Right 12/31/2019   times 2   BREAST LUMPECTOMY Right 08/08/2020   BREAST LUMPECTOMY WITH RADIOACTIVE SEED AND SENTINEL LYMPH NODE BIOPSY Right 08/08/2020   Procedure: RIGHT BREAST LUMPECTOMY WITH RADIOACTIVE SEED X 3 AND SENTINEL LYMPH NODE BIOPSY;  Surgeon: Aron Shoulders, MD;  Location: [facility];  Service: General;  Laterality: Right;   CESAREAN SECTION     x 3   CORRECTION HAMMER TOE Bilateral    PORT-A-CATH REMOVAL Right 09/21/2020   Procedure: REMOVAL PORT-A-CATH;  Surgeon: Aron Shoulders, MD;  Location: [facility] OR;  Service: General;  Laterality: Right;   PORTACATH PLACEMENT Right 02/22/2020   Procedure: RIGHT SUBCLAVIAN VEIN PORT PLACEMENT;  Surgeon: Belinda Cough, MD;  Location: [facility];  Service: General;  Laterality: Right;   ROBOTIC ASSISTED BILATERAL SALPINGO OOPHERECTOMY Bilateral 01/30/2021   Procedure: XI ROBOTIC ASSISTED BILATERAL SALPINGO OOPHORECTOMY, LYSIS OF ADHESIONS, 1 HOUR;  Surgeon: Viktoria Comer SAUNDERS, MD;  Location: [facility];  Service: Gynecology;  Laterality: Bilateral;   TUBAL LIGATION  08/04/02   Social History   Tobacco Use   Smoking status: Never   Smokeless tobacco: Never  Vaping Use   Vaping status: Never Used  Substance Use Topics   Alcohol use: Never   Drug use: Never   Family History  Problem Relation Age of Onset   Colon polyps Mother    Colon cancer Mother    Cancer Mother 24       ovarian cancer; now with suspected breast cancer   Diabetes Mother        also HTN   Breast cancer Mother    Clotting disorder Father    Prostate cancer Father    Colon polyps Sister    Colon cancer Sister 44       anal cancer (2014)   Cancer Sister    Glaucoma Sister    Rectal cancer Sister    Lung cancer Sister 15    Cancer Sister    Esophageal cancer Sister    Stroke Sister 40   Ovarian cancer Sister 69   Hypertension Brother    Hypertension Brother    Stomach cancer Maternal Grandmother    Cancer Maternal Grandmother        unknown female reproductive organ cancer   Breast cancer Maternal Aunt    Cancer Maternal Aunt    Breast cancer Maternal Aunt    Cancer Maternal Aunt    Breast cancer Maternal Aunt    Endometrial cancer Niece    Pancreatic cancer Neg Hx    Allergies  Allergen Reactions   Nabumetone Swelling and Hives   Procaine Swelling   Shellfish Allergy  Anaphylaxis    ROS    Objective:    BP (!) 138/100   Pulse 78   Temp 98.2 F (36.8 C)   Ht 5' 4.25 (1.632 m)   Wt 235 lb (106.6 kg)   SpO2 99%   BMI 40.02  kg/m  BP Readings from Last 3 Encounters:  11/18/23 (!) 138/100  09/30/23 125/83  08/18/23 130/82   Wt Readings from Last 3 Encounters:  11/18/23 235 lb (106.6 kg)  09/30/23 234 lb 9.6 oz (106.4 kg)  08/18/23 233 lb 8 oz (105.9 kg)    Physical Exam Vitals and nursing note reviewed.  Constitutional:      General: She is not in acute distress.    Appearance: Normal appearance.  HENT:     Head: Normocephalic.     Right Ear: Tympanic membrane, ear canal and external ear normal.     Left Ear: Tympanic membrane, ear canal and external ear normal.  Eyes:     Extraocular Movements: Extraocular movements intact.     Conjunctiva/sclera: Conjunctivae normal.     Pupils: Pupils are equal, round, and reactive to light.  Cardiovascular:     Rate and Rhythm: Normal rate and regular rhythm.     Heart sounds: Normal heart sounds.  Pulmonary:     Effort: Pulmonary effort is normal.     Breath sounds: Normal breath sounds.  Musculoskeletal:     Right lower leg: No edema.     Left lower leg: No edema.  Neurological:     General: No focal deficit present.     Mental Status: She is alert and oriented to person, place, and time.  Psychiatric:        Mood and Affect:  Mood normal.        Behavior: Behavior normal.        Thought Content: Thought content normal.        Judgment: Judgment normal.      No results found for any visits on 11/18/23.

## 2023-11-18 NOTE — Telephone Encounter (Signed)
 Pharmacy Patient Advocate Encounter   Received notification from Onbase that prior authorization for Mounjaro 2.5MG /0.5ML auto-injectors is required/requested.   Insurance verification completed.   The patient is insured through [facility].   Per test claim: PA required; PA submitted to above mentioned insurance via Latent Key/confirmation #/EOC [id number] Status is pending

## 2023-11-19 ENCOUNTER — Ambulatory Visit: Payer: Self-pay | Admitting: Family Medicine

## 2023-11-19 LAB — CBC WITH DIFFERENTIAL/PLATELET
Absolute Lymphocytes: 3498 {cells}/uL (ref 850–3900)
Absolute Monocytes: 657 {cells}/uL (ref 200–950)
Basophils Absolute: 53 {cells}/uL (ref 0–200)
Basophils Relative: 0.5 %
Eosinophils Absolute: 233 {cells}/uL (ref 15–500)
Eosinophils Relative: 2.2 %
HCT: 41.5 % (ref 35.0–45.0)
Hemoglobin: 12.9 g/dL (ref 11.7–15.5)
MCH: 25.4 pg — ABNORMAL LOW (ref 27.0–33.0)
MCHC: 31.1 g/dL — ABNORMAL LOW (ref 32.0–36.0)
MCV: 81.7 fL (ref 80.0–100.0)
MPV: 9.9 fL (ref 7.5–12.5)
Monocytes Relative: 6.2 %
Neutro Abs: 6159 {cells}/uL (ref 1500–7800)
Neutrophils Relative %: 58.1 %
Platelets: 303 Thousand/uL (ref 140–400)
RBC: 5.08 Million/uL (ref 3.80–5.10)
RDW: 15.4 % — ABNORMAL HIGH (ref 11.0–15.0)
Total Lymphocyte: 33 %
WBC: 10.6 Thousand/uL (ref 3.8–10.8)

## 2023-11-19 LAB — COMPREHENSIVE METABOLIC PANEL WITH GFR
AG Ratio: 1.3 (calc) (ref 1.0–2.5)
ALT: 15 U/L (ref 6–29)
AST: 18 U/L (ref 10–35)
Albumin: 4.1 g/dL (ref 3.6–5.1)
Alkaline phosphatase (APISO): 94 U/L (ref 37–153)
BUN: 10 mg/dL (ref 7–25)
CO2: 31 mmol/L (ref 20–32)
Calcium: 9.8 mg/dL (ref 8.6–10.4)
Chloride: 104 mmol/L (ref 98–110)
Creat: 0.66 mg/dL (ref 0.50–1.05)
Globulin: 3.2 g/dL (ref 1.9–3.7)
Glucose, Bld: 116 mg/dL — ABNORMAL HIGH (ref 65–99)
Potassium: 3.9 mmol/L (ref 3.5–5.3)
Sodium: 142 mmol/L (ref 135–146)
Total Bilirubin: 0.4 mg/dL (ref 0.2–1.2)
Total Protein: 7.3 g/dL (ref 6.1–8.1)
eGFR: 100 mL/min/1.73m2 (ref >=60)

## 2023-11-19 LAB — LIPID PANEL
Cholesterol: 174 mg/dL (ref <200)
HDL: 48 mg/dL — ABNORMAL LOW (ref >=50)
LDL Cholesterol (Calc): 107 mg/dL — ABNORMAL HIGH
Non-HDL Cholesterol (Calc): 126 mg/dL (ref <130)
Total CHOL/HDL Ratio: 3.6 (calc) (ref <5.0)
Triglycerides: 91 mg/dL (ref <150)

## 2023-11-19 LAB — HEMOGLOBIN A1C
Hgb A1c MFr Bld: 6.8 % — ABNORMAL HIGH (ref <5.7)
Mean Plasma Glucose: 148 mg/dL
eAG (mmol/L): 8.2 mmol/L

## 2023-11-25 ENCOUNTER — Other Ambulatory Visit (HOSPITAL_COMMUNITY): Payer: Self-pay

## 2023-12-09 ENCOUNTER — Ambulatory Visit

## 2023-12-09 DIAGNOSIS — Z23 Encounter for immunization: Secondary | ICD-10-CM | POA: Diagnosis not present

## 2023-12-09 NOTE — Progress Notes (Signed)
 Patient is in office today for a nurse visit for Immunization. Patient Injection was given in the  Left deltoid. Patient tolerated injection well.

## 2024-01-01 ENCOUNTER — Telehealth: Payer: Self-pay

## 2024-01-01 ENCOUNTER — Other Ambulatory Visit (HOSPITAL_COMMUNITY): Payer: Self-pay

## 2024-01-01 NOTE — Telephone Encounter (Signed)
 Pharmacy Patient Advocate Encounter   Received notification from Fax that prior authorization for Avonex  is required/requested.   Insurance verification completed.   The patient is insured through [facility].   Per test claim: PA required; PA submitted to above mentioned insurance via Latent Key/confirmation #/EOC [id number] Status is pending

## 2024-01-02 ENCOUNTER — Other Ambulatory Visit (HOSPITAL_COMMUNITY): Payer: Self-pay

## 2024-01-02 NOTE — Telephone Encounter (Signed)
 Pharmacy Patient Advocate Encounter  Received notification from [facility] that Prior Authorization for Avonex  has been APPROVED from 01/01/2024 to 12/31/2024   PA #/Case ID/Reference #: [id number]

## 2024-01-12 ENCOUNTER — Ambulatory Visit: Attending: Hematology and Oncology

## 2024-01-12 VITALS — Wt 239.0 lb

## 2024-01-12 DIAGNOSIS — Z483 Aftercare following surgery for neoplasm: Secondary | ICD-10-CM | POA: Insufficient documentation

## 2024-01-12 NOTE — Therapy (Signed)
 OUTPATIENT PHYSICAL THERAPY SOZO SCREENING NOTE   Patient Name: Jacqueline Adams MRN: [id number] DOB:April 13, 1960, 63 y.o., female Today's Date: 01/12/2024  PCP: Aletha Bene, MD REFERRING PROVIDER: Odean Potts, MD   PT End of Session - 01/12/24 [phone]     Visit Number --   # unchanged due to screen only   Number of Visits 1    PT Start Time 0935    PT Stop Time 0939    PT Time Calculation (min) 4 min    Activity Tolerance Patient tolerated treatment well    Behavior During Therapy [facility] for tasks assessed/performed          Past Medical History:  Diagnosis Date   Allergy  1979   Shellfish alergy   Anemia    BRCA2 gene mutation positive in female    Breast cancer (HCC) 01/2020   right breast IDC   Bronchitis    Cervical spondylarthritis    Headache(784.0)    High cholesterol    Hypertension    Leukocytosis 2009   Multiple sclerosis 2009   followed by Dr. Maurice   Neuromuscular disorder [facility])    Personal history of chemotherapy    Personal history of radiation therapy    Pneumonia    Rhinitis    Seasonal allergies    Sinusitis    Past Surgical History:  Procedure Laterality Date   ABDOMINAL HYSTERECTOMY  2006   uterus, cervix, portion of fallopian tubes for AUB   BREAST BIOPSY Left 10/10/2021   BREAST BIOPSY Right 02/02/2020   BREAST BIOPSY Right 01/28/2020   BREAST BIOPSY Right 12/31/2019   times 2   BREAST LUMPECTOMY Right 08/08/2020   BREAST LUMPECTOMY WITH RADIOACTIVE SEED AND SENTINEL LYMPH NODE BIOPSY Right 08/08/2020   Procedure: RIGHT BREAST LUMPECTOMY WITH RADIOACTIVE SEED X 3 AND SENTINEL LYMPH NODE BIOPSY;  Surgeon: Aron Shoulders, MD;  Location: [facility];  Service: General;  Laterality: Right;   CESAREAN SECTION     x 3   CORRECTION HAMMER TOE Bilateral    PORT-A-CATH REMOVAL Right 09/21/2020   Procedure: REMOVAL PORT-A-CATH;  Surgeon: Aron Shoulders, MD;  Location: [facility];  Service: General;  Laterality: Right;   PORTACATH PLACEMENT Right  02/22/2020   Procedure: RIGHT SUBCLAVIAN VEIN PORT PLACEMENT;  Surgeon: Belinda Cough, MD;  Location: [facility];  Service: General;  Laterality: Right;   ROBOTIC ASSISTED BILATERAL SALPINGO OOPHERECTOMY Bilateral 01/30/2021   Procedure: XI ROBOTIC ASSISTED BILATERAL SALPINGO OOPHORECTOMY, LYSIS OF ADHESIONS, 1 HOUR;  Surgeon: Viktoria Comer SAUNDERS, MD;  Location: [facility];  Service: Gynecology;  Laterality: Bilateral;   TUBAL LIGATION  08/04/02   Patient Active Problem List   Diagnosis Date Noted   Need for shingles vaccine 05/16/2023   Fatty liver 04/16/2023   Morbid obesity with body mass index (BMI) of 40.0 to 44.9 in adult [facility]) 02/03/2023   Tachycardia 07/26/2022   Type 2 diabetes mellitus with hyperglycemia, without long-term current use of insulin (HCC) 07/26/2022   Abnormality of gait due to impairment of balance 06/07/2022   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/07/2022   Bilateral ovarian cysts    Malignant neoplasm of upper-outer quadrant of right breast in female, estrogen receptor negative (HCC) 01/10/2020   Taking a statin medication 03/25/2019   Microcytic anemia 12/31/2017   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 UACS 12/16/2017   Seasonal allergic rhinitis due to pollen 07/07/2015   Low back pain with sciatica 11/16/2014   HLD (hyperlipidemia) 02/14/2014   Cervical spondylosis without myelopathy 07/28/2013  CFIDS (chronic fatigue and immune dysfunction syndrome) 07/28/2013   Headache, migraine 07/28/2013   DS (disseminated sclerosis) 07/28/2013   DDD (degenerative disc disease), cervical 07/28/2013   Menopausal symptom 07/28/2013   Taking drug for chronic disease 07/28/2013   Vitamin D  deficiency 07/28/2013   Degeneration of intervertebral disc of cervical region 07/28/2013   BRCA gene mutation positive in female 06/12/2011   Genetic susceptibility to malignant neoplasm of breast 06/12/2011   Multiple sclerosis     REFERRING DIAG: right breast cancer at risk for  lymphedema  THERAPY DIAG: Aftercare following surgery for neoplasm  PERTINENT HISTORY: Patient was diagnosed on 12/23/2019 with 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 She underwent neoadjuvant chemotherapy 02/23/2020 - 07/05/2020 followed by a right lumpectomy and sentinel node biopsy (3 negative nodes) on 08/08/2020. Completed radiation.  She has relapsing remitting multiple sclerosis since 2009  PRECAUTIONS: right UE Lymphedema risk, None  SUBJECTIVE: Pt returns for her 6 month L-Dex screen.   PAIN:  Are you having pain? No  SOZO SCREENING: Patient was assessed today using the SOZO machine to determine the lymphedema index score. This was compared to her baseline score. It was determined that she is within the recommended range when compared to her baseline and no further action is needed at this time. She will continue SOZO screenings. These are done every 3 months for 2 years post operatively followed by every 6 months for 2 years, and then annually.   L-DEX FLOWSHEETS - 01/12/24 0900       L-DEX LYMPHEDEMA SCREENING   Measurement Type Unilateral    L-DEX MEASUREMENT EXTREMITY Upper Extremity    POSITION  Standing    DOMINANT SIDE Right    At Risk Side Right    BASELINE SCORE (UNILATERAL) -0.3    L-DEX SCORE (UNILATERAL) 1    VALUE CHANGE (UNILAT) 1.3          P: Cont 6 month L-Dex screens next 07/2024 then can transition to annual.   Aden Berwyn Caldron, PTA 01/12/2024, 9:38 AM

## 2024-02-06 ENCOUNTER — Encounter: Payer: Self-pay | Admitting: Family Medicine

## 2024-02-06 ENCOUNTER — Ambulatory Visit: Admitting: Family Medicine

## 2024-02-06 VITALS — BP 128/82 | HR 88 | Temp 98.0°F | Ht 64.25 in | Wt 237.5 lb

## 2024-02-06 DIAGNOSIS — E785 Hyperlipidemia, unspecified: Secondary | ICD-10-CM | POA: Diagnosis not present

## 2024-02-06 DIAGNOSIS — R000 Tachycardia, unspecified: Secondary | ICD-10-CM | POA: Diagnosis not present

## 2024-02-06 DIAGNOSIS — Z0001 Encounter for general adult medical examination with abnormal findings: Secondary | ICD-10-CM | POA: Diagnosis not present

## 2024-02-06 DIAGNOSIS — G35D Multiple sclerosis, unspecified: Secondary | ICD-10-CM | POA: Diagnosis not present

## 2024-02-06 DIAGNOSIS — C50411 Malignant neoplasm of upper-outer quadrant of right female breast: Secondary | ICD-10-CM | POA: Diagnosis not present

## 2024-02-06 DIAGNOSIS — E1165 Type 2 diabetes mellitus with hyperglycemia: Secondary | ICD-10-CM | POA: Diagnosis not present

## 2024-02-06 DIAGNOSIS — Z171 Estrogen receptor negative status [ER-]: Secondary | ICD-10-CM | POA: Diagnosis not present

## 2024-02-06 DIAGNOSIS — Z0000 Encounter for general adult medical examination without abnormal findings: Secondary | ICD-10-CM

## 2024-02-06 NOTE — Progress Notes (Signed)
 "  Complete physical exam  Assessment & Plan:    Routine Health Maintenance and Physical Exam Discussed health benefits of physical activity, and encouraged her to engage in regular exercise appropriate for her age and condition.  Preventative health care -     CBC with Differential/Platelet -     Comprehensive metabolic panel with GFR  Type 2 diabetes mellitus with hyperglycemia, without long-term current use of insulin (HCC) -     Lipid panel -     VITAMIN D  25 Hydroxy (Vit-D Deficiency, Fractures) -     TSH -     Hemoglobin A1c  Multiple sclerosis  Hyperlipidemia, unspecified hyperlipidemia type  Tachycardia -     EKG 12-Lead  Malignant neoplasm of upper-outer quadrant of right breast in female, estrogen receptor negative (HCC)  Recommend healthy diet i.e mediterranean/DASH diet, consistent exercise - 30 minutes 5 day per week, and gradual weight loss. EKG-NSR, no acute changes noted. Patient will do labs in January and follow up in 3 months with me  Return in about 3 months (around 05/06/2024), or if symptoms worsen or fail to improve, for hyperlipidemia.        Subjective:  Patient ID: Jacqueline Adams, female    DOB: [birth year]  Age: 63 y.o. MRN: [id number] Chief Complaint  Patient presents with   Annual Exam    Here for CPE. No new concerns.     Jacqueline Adams is a 63 y.o. female who presents today for a complete physical exam. Colonoscopy-normal, needs repeat 2035 per patient DEXA scan UTD Tetanus UTD Shingrix  UTD, had symptoms with 2nd one Eye exam UTD History of Present Illness Jacqueline Adams is a 63 year old female who presents for a routine follow-up visit and complete physical exam   She received her first shingles vaccine on July 6th and the second on October 7th, experiencing achiness, fever, and pain for a day or two after the second dose. Her pneumonia vaccine was administered on October 28th, and her flu vaccine on October 1st. She has not  experienced significant illness this year, attributing her health to staying home and wearing a mask.  Her diet has been off track during the holidays, but she has been improving her exercise routine, aided by reminders from her watch to stand, breathe, and move. She has been drinking more water .  She is currently taking metoprolol  25 mg for high heart rate. She feels better since starting the medication. She also takes Losartan  for renal protection  She has not started Mounjaro  due to discomfort with injections. patient will consider this next year if her diabetes is not well controlled.  Physical Exam VITALS: BP- well controlled   Results Diagnostic Colonoscopy (08/04/2023): Normal  Electrocardiogram (EKG)-today NSR  Assessment and Plan Adult Wellness Visit Wellness visit with ongoing health concerns. Recent colonoscopy normal. Vaccinations up to date. Family history of cancer noted. Blood pressure well-controlled. Eye and dental exams current. Weight loss noted. - Continue routine health maintenance and screenings. - Encouraged healthy diet and exercise. - Schedule follow-up labs in January. - Follow up in three months unless issues arise.  Type 2 diabetes mellitus Discussed potential use of Mounjaro  for weight management and glycemic control. Concerns about side effects. Decision to defer Mounjaro  until after lab results in January. - Review lab results in January to assess diabetes management. - Consider Mounjaro  based on lab results and patient preference.  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 No recent EKG performed. - Order EKG to assess current heart rate and rhythm. - Continue metoprolol  25 mg daily. BRCA positive/Triple Negative Breast Cancer- Is doing well. patient sees Dr. Odean at [facility]- stable, sees Dr. Vear in [facility] Maintenance  Topic Date Due   Complete  foot exam   Never done   HIV Screening  Never done   Yearly kidney health urinalysis for diabetes  Never done   COVID-19 Vaccine (5 - 2025-26 season) 02/22/2024*   Pap with HPV screening  04/15/2024*   Eye exam for diabetics  02/10/2024   Hemoglobin A1C  05/18/2024   Breast Cancer Screening  09/14/2024   Yearly kidney function blood test for diabetes  11/17/2024   DTaP/Tdap/Td vaccine (5 - Td or Tdap) 04/29/2030   Colon Cancer Screening  08/03/2033   Pneumococcal Vaccine for age over 46  Completed   Flu Shot  Completed   Hepatitis C Screening  Completed   Zoster (Shingles) Vaccine  Completed   Hepatitis B Vaccine  Aged Out   HPV Vaccine  Aged Out   Meningitis B Vaccine  Aged Out  *Topic was postponed. The date shown is not the original due date.    Most recent fall risk assessment:    08/18/2023    8:35 AM  Fall Risk   Falls in the past year? 0  Number falls in past yr: 0  Injury with Fall? 0      Data saved with a previous flowsheet row definition     Most recent depression screenings:    08/18/2023    8:35 AM 05/16/2023    9:09 AM  PHQ 2/9 Scores  PHQ - 2 Score 0 0      The 10-year ASCVD risk score (Arnett DK, et al., 2019) is: 12.3%   Values used to calculate the score:     Age: 67 years     Clinically relevant sex: Female     Is Non-Hispanic African American: Yes     Diabetic: Yes     Tobacco smoker: No     Systolic Blood Pressure: 128 mmHg     Is BP treated: No     HDL Cholesterol: 48 mg/dL     Total Cholesterol: 174 mg/dL  Past Surgical History:  Procedure Laterality Date   ABDOMINAL HYSTERECTOMY  2006   uterus, cervix, portion of fallopian tubes for AUB   BREAST BIOPSY Left 10/10/2021   BREAST BIOPSY Right 02/02/2020   BREAST BIOPSY Right 01/28/2020   BREAST BIOPSY Right 12/31/2019   times 2   BREAST LUMPECTOMY Right 08/08/2020   BREAST LUMPECTOMY WITH RADIOACTIVE SEED AND SENTINEL LYMPH NODE BIOPSY Right 08/08/2020   Procedure: RIGHT BREAST  LUMPECTOMY WITH RADIOACTIVE SEED X 3 AND SENTINEL LYMPH NODE BIOPSY;  Surgeon: Aron Shoulders, MD;  Location: [facility] OR;  Service: General;  Laterality: Right;   CESAREAN SECTION     x 3   CORRECTION HAMMER TOE Bilateral    PORT-A-CATH REMOVAL Right 09/21/2020   Procedure: REMOVAL PORT-A-CATH;  Surgeon: Aron Shoulders, MD;  Location: [facility] OR;  Service: General;  Laterality: Right;   PORTACATH PLACEMENT Right 02/22/2020   Procedure: RIGHT SUBCLAVIAN VEIN PORT PLACEMENT;  Surgeon: Belinda Cough, MD;  Location: [facility];  Service: General;  Laterality: Right;   ROBOTIC ASSISTED BILATERAL SALPINGO OOPHERECTOMY Bilateral 01/30/2021   Procedure: XI ROBOTIC ASSISTED BILATERAL SALPINGO OOPHORECTOMY, LYSIS OF ADHESIONS, 1 HOUR;  Surgeon: Viktoria,  Comer SAUNDERS, MD;  Location: [facility];  Service: Gynecology;  Laterality: Bilateral;   TUBAL LIGATION  08/04/02   Social History[1] Social History   Socioeconomic History   Marital status: Married    Spouse name: John   Number of children: 2   Years of education: [employer] education level: Some college, no degree  Occupational History   Occupation: Management Consultant: [employer]  Tobacco Use   Smoking status: Never   Smokeless tobacco: Never  Vaping Use   Vaping status: Never Used  Substance and Sexual Activity   Alcohol use: Never   Drug use: Never   Sexual activity: Not Currently    Birth control/protection: Surgical  Other Topics Concern   Not on file  Social History Narrative   Working full time. Patient lives at home with her spouse.Caffeine Use: 2 sodas daily   Social Drivers of Health   Tobacco Use: Low Risk (02/06/2024)   Patient History    Smoking Tobacco Use: Never    Smokeless Tobacco Use: Never    Passive Exposure: Not on file  Financial Resource Strain: Low Risk (02/02/2024)   Overall Financial Resource Strain (CARDIA)    Difficulty of Paying Living Expenses: Not hard at all  Food Insecurity: No Food  Insecurity (02/02/2024)   Epic    Worried About Radiation Protection Practitioner of Food in the Last Year: Never true    Ran Out of Food in the Last Year: Never true  Transportation Needs: No Transportation Needs (02/02/2024)   Epic    Lack of Transportation (Medical): No    Lack of Transportation (Non-Medical): No  Physical Activity: Sufficiently Active (02/02/2024)   Exercise Vital Sign    Days of Exercise per Week: 5 days    Minutes of Exercise per Session: 30 min  Stress: No Stress Concern Present (02/02/2024)   [employer] of Occupational Health - Occupational Stress Questionnaire    Feeling of Stress: Not at all  Social Connections: Socially Integrated (02/02/2024)   Social Connection and Isolation Panel    Frequency of Communication with Friends and Family: More than three times a week    Frequency of Social Gatherings with Friends and Family: More than three times a week    Attends Religious Services: More than 4 times per year    Active Member of [employer] or Organizations: Yes    Attends Banker Meetings: More than 4 times per year    Marital Status: Married  Catering Manager Violence: Not on file  Depression (PHQ2-9): Low Risk (08/18/2023)   Depression (PHQ2-9)    PHQ-2 Score: 0  Alcohol Screen: Not on file  Housing: Low Risk (02/02/2024)   Epic    Unable to Pay for Housing in the Last Year: No    Number of Times Moved in the Last Year: 0    Homeless in the Last Year: No  Utilities: Low Risk (07/25/2022)   Received from [facility]   Utilities    In the past 12 months has the electric, gas, oil, or water  company threatened to shut off services in your home? : No  Health Literacy: Not on file   Family History  Problem Relation Age of Onset   Colon polyps Mother    Colon cancer Mother    Ovarian cancer Mother 51       ovarian cancer; now with suspected breast cancer   Diabetes Mother        also HTN  Breast cancer Mother    Clotting disorder Father    Prostate  cancer Father    Colon polyps Sister    Colon cancer Sister 61       anal cancer (2014)   Glaucoma Sister    Stroke Sister    Ovarian cancer Sister    Rectal cancer Sister    Lung cancer Sister 43   Cancer Sister    Esophageal cancer Sister    Diabetes Mellitus II Sister 37   Hypertension Brother    Hypertension Brother    Stomach cancer Maternal Grandmother    Cancer Maternal Grandmother        unknown female reproductive organ cancer   Breast cancer Maternal Aunt    Cancer Maternal Aunt    Breast cancer Maternal Aunt    Cancer Maternal Aunt    Breast cancer Maternal Aunt    Endometrial cancer Niece    Pancreatic cancer Neg Hx    Allergies[2]   Patient Care Team: Aletha Bene, MD as PCP - General (Family Medicine) Mable Lenis, MD (Inactive) Odean Potts, MD as Consulting Physician (Hematology and Oncology) Dewey Rush, MD as Consulting Physician (Radiation Oncology) Sater, Charlie LABOR, MD (Neurology) Aron Shoulders, MD as Consulting Physician (Surgical Oncology) Delford Maude BROCKS, MD as Consulting Physician (Cardiology) Lucila Rush LABOR, RPH-CPP as Pharmacist (Hematology and Oncology)   Show/hide medication list[3]  ROS     Objective:    BP 128/82   Pulse 88   Temp 98 F (36.7 C)   Ht 5' 4.25 (1.632 m)   Wt 237 lb 8 oz (107.7 kg)   SpO2 99%   BMI 40.45 kg/m  BP Readings from Last 3 Encounters:  02/06/24 128/82  11/18/23 (!) 138/100  09/30/23 125/83   Wt Readings from Last 3 Encounters:  02/06/24 237 lb 8 oz (107.7 kg)  01/12/24 239 lb (108.4 kg)  11/18/23 235 lb (106.6 kg)    Physical Exam Vitals and nursing note reviewed.  Constitutional:      General: She is not in acute distress.    Appearance: Normal appearance.  HENT:     Head: Normocephalic.     Right Ear: Tympanic membrane, ear canal and external ear normal.     Left Ear: Tympanic membrane, ear canal and external ear normal.  Eyes:     Extraocular Movements: Extraocular movements  intact.     Conjunctiva/sclera: Conjunctivae normal.     Pupils: Pupils are equal, round, and reactive to light.  Cardiovascular:     Rate and Rhythm: Normal rate and regular rhythm.     Heart sounds: Normal heart sounds.  Pulmonary:     Effort: Pulmonary effort is normal.     Breath sounds: Normal breath sounds. No wheezing or rhonchi.  Chest:  Breasts:    Right: No swelling, bleeding, inverted nipple, mass, nipple discharge, skin change or tenderness.     Left: No mass, nipple discharge, skin change or tenderness.     Comments: Breast- Surgical scar noted right side from lumpectomy Abdominal:     General: Bowel sounds are normal.     Tenderness: There is no abdominal tenderness. There is no guarding or rebound.  Musculoskeletal:     Right lower leg: No edema.     Left lower leg: No edema.  Neurological:     General: No focal deficit present.     Mental Status: She is alert and oriented to person, place, and time. Mental status is at baseline.  Cranial Nerves: Cranial nerves 2-12 are intact. No cranial nerve deficit.     Motor: No weakness.     Coordination: Coordination is intact. Coordination normal. Finger-Nose-Finger Test normal.     Gait: Gait is intact. Gait and tandem walk normal.     Deep Tendon Reflexes: Reflexes are normal and symmetric. Reflexes normal.  Psychiatric:        Mood and Affect: Mood normal.        Behavior: Behavior normal.        Thought Content: Thought content normal.        Judgment: Judgment normal.      No results found for any visits on 02/06/24.      Connie Emperor, MD      [1]  Social History Tobacco Use   Smoking status: Never   Smokeless tobacco: Never  Vaping Use   Vaping status: Never Used  Substance Use Topics   Alcohol use: Never   Drug use: Never  [2]  Allergies Allergen Reactions   Nabumetone Swelling and Hives   Procaine Swelling   Shellfish Allergy  Anaphylaxis  [3]  Outpatient Medications Prior to Visit   Medication Sig   Calcium Carb-Cholecalciferol (CALCIUM 600+D3 PO) Take 1 tablet by mouth in the morning and at bedtime.   fexofenadine (ALLEGRA) 180 MG tablet Take 180 mg by mouth daily as needed for allergies.   fluticasone  (FLONASE ) 50 MCG/ACT nasal spray Place 2 sprays into both nostrils daily.   ibuprofen  (ADVIL ) 200 MG tablet Take 600 mg by mouth as needed. W/Avonex  injection   interferon beta-1a  (AVONEX  PREFILLED) 30 MCG/0.5ML PSKT injection INJECT ONE SYRINGE INTRAMUSCULARLY ONCE WEEKLY. REFRIGERATE. PROTECT FROM LIGHT. ALLOW TO WARM TO ROOM TEMPERATURE PRIOR TO USE.   KLOR-CON  M20 20 MEQ tablet TAKE 1 TABLET BY MOUTH EVERY DAY IN THE MORNING   losartan  (COZAAR ) 25 MG tablet Take 1 tablet (25 mg total) by mouth daily.   magnesium oxide (MAG-OX) 400 (240 Mg) MG tablet Take 400 mg by mouth daily. 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   Multiple Vitamin (MULTIVITAMIN WITH MINERALS) TABS tablet Take 1 tablet by mouth every evening. Centrum   simvastatin  (ZOCOR ) 20 MG tablet Take 1 tablet (20 mg total) by mouth every evening.   tirzepatide  (MOUNJARO ) 2.5 MG/0.5ML Pen Inject 2.5 mg into the skin once a week.   VENTOLIN HFA 108 (90 BASE) MCG/ACT inhaler Inhale 1-2 puffs into the lungs every 6 (six) hours as needed for shortness of breath or wheezing.   No facility-administered medications prior to visit.   "

## 2024-02-10 NOTE — Telephone Encounter (Signed)
 Pt called stating that [facility] called her to request for  Pt to call office to follow up  about PA   for medication interferon beta-1a  (AVONEX  PREFILLED) 30 MCG/0.5ML PSKT injection   In formed pa was approved Pt will like someone to reach out to Pharmacy

## 2024-02-11 ENCOUNTER — Other Ambulatory Visit: Payer: Self-pay | Admitting: Neurology

## 2024-02-11 DIAGNOSIS — G35D Multiple sclerosis, unspecified: Secondary | ICD-10-CM

## 2024-02-16 NOTE — Telephone Encounter (Signed)
 Last seen on 09/30/23 Follow up scheduled on 05/03/24

## 2024-02-20 ENCOUNTER — Other Ambulatory Visit: Payer: Self-pay | Admitting: Family Medicine

## 2024-02-20 DIAGNOSIS — E785 Hyperlipidemia, unspecified: Secondary | ICD-10-CM

## 2024-02-25 ENCOUNTER — Telehealth: Payer: Self-pay | Admitting: Family Medicine

## 2024-02-25 DIAGNOSIS — G35D Multiple sclerosis, unspecified: Secondary | ICD-10-CM

## 2024-02-25 MED ORDER — AVONEX PREFILLED 30 MCG/0.5ML IM PSKT: PREFILLED_SYRINGE | INTRAMUSCULAR | 3 refills | Status: DC

## 2024-02-25 NOTE — Telephone Encounter (Signed)
 refilled

## 2024-02-25 NOTE — Telephone Encounter (Signed)
 Lydia from [facility] called stating that the pt has been approved to receive an emergency rx for a 7 days of the Avonex . Please send to [facility].

## 2024-02-25 NOTE — Telephone Encounter (Signed)
Notified patient medication has been approved.

## 2024-03-02 ENCOUNTER — Other Ambulatory Visit

## 2024-03-02 NOTE — Telephone Encounter (Signed)
 Pt called stating that she was sent the pen of the interferon beta-1a  (AVONEX  PREFILLED) 30 MCG/0.5ML PSKT injection and she is supposed to get the injection. She stated she has always had the injection and would like this fixed.

## 2024-03-03 ENCOUNTER — Other Ambulatory Visit: Payer: Self-pay | Admitting: *Deleted

## 2024-03-03 DIAGNOSIS — C50411 Malignant neoplasm of upper-outer quadrant of right female breast: Secondary | ICD-10-CM

## 2024-03-03 MED ORDER — AVONEX PREFILLED 30 MCG/0.5ML IM PSKT: 30.0000 ug | PREFILLED_SYRINGE | INTRAMUSCULAR | 3 refills | Status: AC

## 2024-03-03 NOTE — Addendum Note (Signed)
 Addended by: ONEITA NEVELYN BRAVO on: 03/03/2024 09:09 AM   Modules accepted: Orders

## 2024-03-03 NOTE — Telephone Encounter (Signed)
 Called and spoke to pt and stated that we would get this sent to [facility]. She voiced gratitude and understanding of all discussed. She also stated that the autoinjector was not the prefilled injection she normally gets and even though its compliementary she cannot use it.

## 2024-03-08 ENCOUNTER — Inpatient Hospital Stay: Payer: 59 | Admitting: Hematology and Oncology

## 2024-03-10 ENCOUNTER — Other Ambulatory Visit

## 2024-03-10 ENCOUNTER — Other Ambulatory Visit: Payer: Self-pay | Admitting: Hematology and Oncology

## 2024-03-10 DIAGNOSIS — Z9889 Other specified postprocedural states: Secondary | ICD-10-CM

## 2024-03-11 ENCOUNTER — Other Ambulatory Visit

## 2024-03-12 ENCOUNTER — Ambulatory Visit: Payer: Self-pay | Admitting: Family Medicine

## 2024-03-12 LAB — LIPID PANEL
Cholesterol: 181 mg/dL
HDL: 48 mg/dL — ABNORMAL LOW
LDL Cholesterol (Calc): 112 mg/dL — ABNORMAL HIGH
Non-HDL Cholesterol (Calc): 133 mg/dL — ABNORMAL HIGH
Total CHOL/HDL Ratio: 3.8 (calc)
Triglycerides: 104 mg/dL

## 2024-03-12 LAB — COMPREHENSIVE METABOLIC PANEL WITH GFR
AG Ratio: 1.3 (calc) (ref 1.0–2.5)
ALT: 17 U/L (ref 6–29)
AST: 19 U/L (ref 10–35)
Albumin: 4.1 g/dL (ref 3.6–5.1)
Alkaline phosphatase (APISO): 102 U/L (ref 37–153)
BUN: 7 mg/dL (ref 7–25)
CO2: 33 mmol/L — ABNORMAL HIGH (ref 20–32)
Calcium: 9.8 mg/dL (ref 8.6–10.4)
Chloride: 104 mmol/L (ref 98–110)
Creat: 0.6 mg/dL (ref 0.50–1.05)
Globulin: 3.1 g/dL (ref 1.9–3.7)
Glucose, Bld: 99 mg/dL (ref 65–99)
Potassium: 4.1 mmol/L (ref 3.5–5.3)
Sodium: 143 mmol/L (ref 135–146)
Total Bilirubin: 0.4 mg/dL (ref 0.2–1.2)
Total Protein: 7.2 g/dL (ref 6.1–8.1)
eGFR: 102 mL/min/{1.73_m2}

## 2024-03-12 LAB — CBC WITH DIFFERENTIAL/PLATELET
Absolute Lymphocytes: 3708 {cells}/uL (ref 850–3900)
Absolute Monocytes: 670 {cells}/uL (ref 200–950)
Basophils Absolute: 50 {cells}/uL (ref 0–200)
Basophils Relative: 0.4 %
Eosinophils Absolute: 260 {cells}/uL (ref 15–500)
Eosinophils Relative: 2.1 %
HCT: 40.8 % (ref 35.9–46.0)
Hemoglobin: 13 g/dL (ref 11.7–15.5)
MCH: 25.6 pg — ABNORMAL LOW (ref 27.0–33.0)
MCHC: 31.9 g/dL (ref 31.6–35.4)
MCV: 80.3 fL — ABNORMAL LOW (ref 81.4–101.7)
MPV: 10 fL (ref 7.5–12.5)
Monocytes Relative: 5.4 %
Neutro Abs: 7713 {cells}/uL (ref 1500–7800)
Neutrophils Relative %: 62.2 %
Platelets: 307 10*3/uL (ref 140–400)
RBC: 5.08 Million/uL (ref 3.80–5.10)
RDW: 14.9 % (ref 11.0–15.0)
Total Lymphocyte: 29.9 %
WBC: 12.4 10*3/uL — ABNORMAL HIGH (ref 3.8–10.8)

## 2024-03-12 LAB — HEMOGLOBIN A1C
Hgb A1c MFr Bld: 7 % — ABNORMAL HIGH
Mean Plasma Glucose: 154 mg/dL
eAG (mmol/L): 8.5 mmol/L

## 2024-03-12 LAB — TSH: TSH: 1.38 m[IU]/L (ref 0.40–4.50)

## 2024-03-12 LAB — VITAMIN D 25 HYDROXY (VIT D DEFICIENCY, FRACTURES): Vit D, 25-Hydroxy: 81 ng/mL (ref 30–100)

## 2024-03-15 ENCOUNTER — Other Ambulatory Visit

## 2024-04-01 ENCOUNTER — Inpatient Hospital Stay

## 2024-04-01 ENCOUNTER — Inpatient Hospital Stay: Admitting: Hematology and Oncology

## 2024-04-07 ENCOUNTER — Encounter

## 2024-05-03 ENCOUNTER — Ambulatory Visit: Admitting: Neurology

## 2024-05-10 ENCOUNTER — Ambulatory Visit: Admitting: Family Medicine

## 2024-05-12 ENCOUNTER — Ambulatory Visit: Admitting: Neurology

## 2024-07-12 ENCOUNTER — Ambulatory Visit
